# Patient Record
Sex: Female | Born: 1935 | Race: White | Hispanic: No | Marital: Married | Smoking: Former smoker
Health system: Southern US, Community
[De-identification: ages and names within clinical notes are randomized; demographics above are authoritative.]

## PROBLEM LIST (undated history)

## (undated) DIAGNOSIS — E782 Mixed hyperlipidemia: Secondary | ICD-10-CM

## (undated) DIAGNOSIS — K2950 Unspecified chronic gastritis without bleeding: Secondary | ICD-10-CM

## (undated) DIAGNOSIS — IMO0001 Reserved for inherently not codable concepts without codable children: Secondary | ICD-10-CM

## (undated) DIAGNOSIS — I482 Chronic atrial fibrillation, unspecified: Secondary | ICD-10-CM

## (undated) DIAGNOSIS — D649 Anemia, unspecified: Secondary | ICD-10-CM

## (undated) DIAGNOSIS — K449 Diaphragmatic hernia without obstruction or gangrene: Secondary | ICD-10-CM

## (undated) DIAGNOSIS — M4850XA Collapsed vertebra, not elsewhere classified, site unspecified, initial encounter for fracture: Secondary | ICD-10-CM

## (undated) DIAGNOSIS — J302 Other seasonal allergic rhinitis: Secondary | ICD-10-CM

## (undated) DIAGNOSIS — H353190 Nonexudative age-related macular degeneration, unspecified eye, stage unspecified: Secondary | ICD-10-CM

## (undated) DIAGNOSIS — N182 Chronic kidney disease, stage 2 (mild): Secondary | ICD-10-CM

## (undated) DIAGNOSIS — H3530 Unspecified macular degeneration: Secondary | ICD-10-CM

## (undated) DIAGNOSIS — Z8673 Personal history of transient ischemic attack (TIA), and cerebral infarction without residual deficits: Secondary | ICD-10-CM

## (undated) DIAGNOSIS — R918 Other nonspecific abnormal finding of lung field: Secondary | ICD-10-CM

## (undated) DIAGNOSIS — M1990 Unspecified osteoarthritis, unspecified site: Secondary | ICD-10-CM

## (undated) DIAGNOSIS — M810 Age-related osteoporosis without current pathological fracture: Secondary | ICD-10-CM

## (undated) DIAGNOSIS — H409 Unspecified glaucoma: Secondary | ICD-10-CM

## (undated) DIAGNOSIS — I509 Heart failure, unspecified: Secondary | ICD-10-CM

## (undated) DIAGNOSIS — K5790 Diverticulosis of intestine, part unspecified, without perforation or abscess without bleeding: Secondary | ICD-10-CM

## (undated) DIAGNOSIS — J449 Chronic obstructive pulmonary disease, unspecified: Secondary | ICD-10-CM

## (undated) DIAGNOSIS — K219 Gastro-esophageal reflux disease without esophagitis: Secondary | ICD-10-CM

## (undated) DIAGNOSIS — E039 Hypothyroidism, unspecified: Secondary | ICD-10-CM

## (undated) DIAGNOSIS — F419 Anxiety disorder, unspecified: Secondary | ICD-10-CM

## (undated) DIAGNOSIS — Z85828 Personal history of other malignant neoplasm of skin: Secondary | ICD-10-CM

## (undated) DIAGNOSIS — Z8679 Personal history of other diseases of the circulatory system: Secondary | ICD-10-CM

## (undated) DIAGNOSIS — F32A Depression, unspecified: Secondary | ICD-10-CM

## (undated) DIAGNOSIS — I10 Essential (primary) hypertension: Secondary | ICD-10-CM

## (undated) DIAGNOSIS — F329 Major depressive disorder, single episode, unspecified: Secondary | ICD-10-CM

## (undated) DIAGNOSIS — R0902 Hypoxemia: Secondary | ICD-10-CM

## (undated) DIAGNOSIS — Z8781 Personal history of (healed) traumatic fracture: Secondary | ICD-10-CM

## (undated) DIAGNOSIS — E119 Type 2 diabetes mellitus without complications: Secondary | ICD-10-CM

## (undated) DIAGNOSIS — M359 Systemic involvement of connective tissue, unspecified: Secondary | ICD-10-CM

## (undated) HISTORY — DX: Hypothyroidism, unspecified: E03.9

## (undated) HISTORY — DX: Anemia, unspecified: D64.9

## (undated) HISTORY — DX: Diaphragmatic hernia without obstruction or gangrene: K44.9

## (undated) HISTORY — DX: Reserved for inherently not codable concepts without codable children: IMO0001

## (undated) HISTORY — DX: Hypoxemia: R09.02

## (undated) HISTORY — DX: Age-related osteoporosis without current pathological fracture: M81.0

## (undated) HISTORY — DX: Depression, unspecified: F32.A

## (undated) HISTORY — DX: Chronic kidney disease, stage 2 (mild): N18.2

## (undated) HISTORY — DX: Personal history of other diseases of the circulatory system: Z86.79

## (undated) HISTORY — DX: Essential (primary) hypertension: I10

## (undated) HISTORY — DX: Heart failure, unspecified: I50.9

## (undated) HISTORY — DX: Anxiety disorder, unspecified: F41.9

## (undated) HISTORY — DX: Unspecified chronic gastritis without bleeding: K29.50

## (undated) HISTORY — PX: TONSILLECTOMY: SUR1361

## (undated) HISTORY — DX: Unspecified glaucoma: H40.9

## (undated) HISTORY — DX: Mixed hyperlipidemia: E78.2

## (undated) HISTORY — DX: Personal history of transient ischemic attack (TIA), and cerebral infarction without residual deficits: Z86.73

## (undated) HISTORY — DX: Major depressive disorder, single episode, unspecified: F32.9

## (undated) HISTORY — DX: Personal history of other malignant neoplasm of skin: Z85.828

## (undated) HISTORY — DX: Unspecified macular degeneration: H35.30

## (undated) HISTORY — DX: Other nonspecific abnormal finding of lung field: R91.8

## (undated) HISTORY — DX: Chronic atrial fibrillation, unspecified: I48.20

## (undated) HISTORY — PX: DILATION AND CURETTAGE OF UTERUS: SHX78

## (undated) HISTORY — DX: Collapsed vertebra, not elsewhere classified, site unspecified, initial encounter for fracture: M48.50XA

## (undated) HISTORY — DX: Diverticulosis of intestine, part unspecified, without perforation or abscess without bleeding: K57.90

## (undated) HISTORY — DX: Unspecified osteoarthritis, unspecified site: M19.90

## (undated) HISTORY — DX: Nonexudative age-related macular degeneration, unspecified eye, stage unspecified: H35.3190

## (undated) HISTORY — DX: Other seasonal allergic rhinitis: J30.2

## (undated) HISTORY — DX: Personal history of (healed) traumatic fracture: Z87.81

## (undated) HISTORY — DX: Gastro-esophageal reflux disease without esophagitis: K21.9

## (undated) HISTORY — DX: Type 2 diabetes mellitus without complications: E11.9

## (undated) HISTORY — DX: Chronic obstructive pulmonary disease, unspecified: J44.9

---

## 1981-07-20 HISTORY — PX: BREAST LUMPECTOMY: SHX2

## 1998-02-22 ENCOUNTER — Ambulatory Visit (HOSPITAL_COMMUNITY): Admission: RE | Admit: 1998-02-22 | Discharge: 1998-02-22 | Payer: Self-pay | Admitting: *Deleted

## 1999-02-28 ENCOUNTER — Ambulatory Visit (HOSPITAL_COMMUNITY): Admission: RE | Admit: 1999-02-28 | Discharge: 1999-02-28 | Payer: Self-pay | Admitting: *Deleted

## 2000-03-05 ENCOUNTER — Ambulatory Visit (HOSPITAL_COMMUNITY): Admission: RE | Admit: 2000-03-05 | Discharge: 2000-03-05 | Payer: Self-pay

## 2000-03-05 ENCOUNTER — Ambulatory Visit (HOSPITAL_COMMUNITY): Admission: RE | Admit: 2000-03-05 | Discharge: 2000-03-05 | Payer: Self-pay | Admitting: *Deleted

## 2000-12-23 ENCOUNTER — Other Ambulatory Visit: Admission: RE | Admit: 2000-12-23 | Discharge: 2000-12-23 | Payer: Self-pay | Admitting: Dermatology

## 2001-03-11 ENCOUNTER — Ambulatory Visit (HOSPITAL_COMMUNITY): Admission: RE | Admit: 2001-03-11 | Discharge: 2001-03-11 | Payer: Self-pay | Admitting: *Deleted

## 2001-03-11 ENCOUNTER — Ambulatory Visit (HOSPITAL_COMMUNITY): Admission: RE | Admit: 2001-03-11 | Discharge: 2001-03-11 | Payer: Self-pay

## 2001-03-17 ENCOUNTER — Ambulatory Visit (HOSPITAL_COMMUNITY): Admission: RE | Admit: 2001-03-17 | Discharge: 2001-03-17 | Payer: Self-pay | Admitting: Internal Medicine

## 2001-09-14 ENCOUNTER — Ambulatory Visit (HOSPITAL_COMMUNITY): Admission: RE | Admit: 2001-09-14 | Discharge: 2001-09-14 | Payer: Self-pay | Admitting: Internal Medicine

## 2001-09-14 ENCOUNTER — Encounter: Payer: Self-pay | Admitting: Internal Medicine

## 2001-10-03 ENCOUNTER — Other Ambulatory Visit: Admission: RE | Admit: 2001-10-03 | Discharge: 2001-10-03 | Payer: Self-pay | Admitting: Dermatology

## 2001-10-24 ENCOUNTER — Other Ambulatory Visit: Admission: RE | Admit: 2001-10-24 | Discharge: 2001-10-24 | Payer: Self-pay | Admitting: Obstetrics and Gynecology

## 2001-10-31 ENCOUNTER — Ambulatory Visit (HOSPITAL_COMMUNITY): Admission: RE | Admit: 2001-10-31 | Discharge: 2001-10-31 | Payer: Self-pay | Admitting: Internal Medicine

## 2001-10-31 ENCOUNTER — Encounter: Payer: Self-pay | Admitting: Internal Medicine

## 2002-03-17 ENCOUNTER — Ambulatory Visit (HOSPITAL_COMMUNITY): Admission: RE | Admit: 2002-03-17 | Discharge: 2002-03-17 | Payer: Self-pay | Admitting: Obstetrics & Gynecology

## 2002-03-17 ENCOUNTER — Ambulatory Visit (HOSPITAL_COMMUNITY): Admission: RE | Admit: 2002-03-17 | Discharge: 2002-03-17 | Payer: Self-pay | Admitting: *Deleted

## 2002-05-17 ENCOUNTER — Other Ambulatory Visit: Admission: RE | Admit: 2002-05-17 | Discharge: 2002-05-17 | Payer: Self-pay | Admitting: Dermatology

## 2003-01-18 ENCOUNTER — Ambulatory Visit (HOSPITAL_COMMUNITY): Admission: RE | Admit: 2003-01-18 | Discharge: 2003-01-18 | Payer: Self-pay | Admitting: Internal Medicine

## 2003-01-18 ENCOUNTER — Encounter: Payer: Self-pay | Admitting: Internal Medicine

## 2003-01-24 ENCOUNTER — Ambulatory Visit (HOSPITAL_COMMUNITY): Admission: RE | Admit: 2003-01-24 | Discharge: 2003-01-24 | Payer: Self-pay | Admitting: Internal Medicine

## 2003-03-22 ENCOUNTER — Ambulatory Visit (HOSPITAL_COMMUNITY): Admission: RE | Admit: 2003-03-22 | Discharge: 2003-03-22 | Payer: Self-pay | Admitting: Internal Medicine

## 2003-03-22 ENCOUNTER — Encounter: Payer: Self-pay | Admitting: Internal Medicine

## 2003-03-30 ENCOUNTER — Ambulatory Visit (HOSPITAL_COMMUNITY): Admission: RE | Admit: 2003-03-30 | Discharge: 2003-03-30 | Payer: Self-pay | Admitting: *Deleted

## 2003-04-02 ENCOUNTER — Encounter: Payer: Self-pay | Admitting: Cardiology

## 2003-04-02 ENCOUNTER — Ambulatory Visit (HOSPITAL_COMMUNITY): Admission: RE | Admit: 2003-04-02 | Discharge: 2003-04-02 | Payer: Self-pay | Admitting: Cardiology

## 2004-01-03 ENCOUNTER — Other Ambulatory Visit: Admission: RE | Admit: 2004-01-03 | Discharge: 2004-01-03 | Payer: Self-pay | Admitting: Dermatology

## 2004-02-21 ENCOUNTER — Other Ambulatory Visit: Admission: RE | Admit: 2004-02-21 | Discharge: 2004-02-21 | Payer: Self-pay | Admitting: Dermatology

## 2004-04-22 ENCOUNTER — Ambulatory Visit (HOSPITAL_COMMUNITY): Admission: RE | Admit: 2004-04-22 | Discharge: 2004-04-22 | Payer: Self-pay | Admitting: Obstetrics and Gynecology

## 2004-08-26 ENCOUNTER — Ambulatory Visit (HOSPITAL_COMMUNITY): Admission: RE | Admit: 2004-08-26 | Discharge: 2004-08-26 | Payer: Self-pay | Admitting: Family Medicine

## 2004-09-08 ENCOUNTER — Other Ambulatory Visit: Admission: RE | Admit: 2004-09-08 | Discharge: 2004-09-08 | Payer: Self-pay | Admitting: Dermatology

## 2004-11-06 ENCOUNTER — Ambulatory Visit (HOSPITAL_COMMUNITY): Admission: RE | Admit: 2004-11-06 | Discharge: 2004-11-06 | Payer: Self-pay | Admitting: Family Medicine

## 2004-11-27 ENCOUNTER — Ambulatory Visit: Payer: Self-pay | Admitting: Internal Medicine

## 2004-11-27 ENCOUNTER — Ambulatory Visit (HOSPITAL_COMMUNITY): Admission: RE | Admit: 2004-11-27 | Discharge: 2004-11-27 | Payer: Self-pay | Admitting: Internal Medicine

## 2005-04-29 ENCOUNTER — Other Ambulatory Visit: Admission: RE | Admit: 2005-04-29 | Discharge: 2005-04-29 | Payer: Self-pay | Admitting: Dermatology

## 2005-05-08 ENCOUNTER — Ambulatory Visit (HOSPITAL_COMMUNITY): Admission: RE | Admit: 2005-05-08 | Discharge: 2005-05-08 | Payer: Self-pay | Admitting: Internal Medicine

## 2005-05-14 ENCOUNTER — Encounter (HOSPITAL_COMMUNITY): Admission: RE | Admit: 2005-05-14 | Discharge: 2005-06-13 | Payer: Self-pay | Admitting: Internal Medicine

## 2006-05-10 ENCOUNTER — Ambulatory Visit (HOSPITAL_COMMUNITY): Admission: RE | Admit: 2006-05-10 | Discharge: 2006-05-10 | Payer: Self-pay | Admitting: Internal Medicine

## 2006-05-20 HISTORY — PX: PATELLA FRACTURE SURGERY: SHX735

## 2007-03-04 ENCOUNTER — Emergency Department (HOSPITAL_COMMUNITY): Admission: EM | Admit: 2007-03-04 | Discharge: 2007-03-04 | Payer: Self-pay | Admitting: Emergency Medicine

## 2007-05-12 ENCOUNTER — Ambulatory Visit (HOSPITAL_COMMUNITY): Admission: RE | Admit: 2007-05-12 | Discharge: 2007-05-12 | Payer: Self-pay | Admitting: Obstetrics and Gynecology

## 2007-06-02 ENCOUNTER — Ambulatory Visit (HOSPITAL_COMMUNITY): Admission: RE | Admit: 2007-06-02 | Discharge: 2007-06-02 | Payer: Self-pay | Admitting: Internal Medicine

## 2008-04-10 ENCOUNTER — Other Ambulatory Visit: Admission: RE | Admit: 2008-04-10 | Discharge: 2008-04-10 | Payer: Self-pay | Admitting: Obstetrics and Gynecology

## 2008-05-09 ENCOUNTER — Ambulatory Visit: Payer: Self-pay | Admitting: Cardiology

## 2008-05-10 ENCOUNTER — Encounter: Payer: Self-pay | Admitting: Cardiology

## 2008-05-10 ENCOUNTER — Ambulatory Visit: Payer: Self-pay | Admitting: Cardiology

## 2008-05-10 ENCOUNTER — Ambulatory Visit (HOSPITAL_COMMUNITY): Admission: RE | Admit: 2008-05-10 | Discharge: 2008-05-10 | Payer: Self-pay | Admitting: Cardiology

## 2008-05-14 ENCOUNTER — Ambulatory Visit: Payer: Self-pay | Admitting: Cardiology

## 2008-05-16 ENCOUNTER — Ambulatory Visit (HOSPITAL_COMMUNITY): Admission: RE | Admit: 2008-05-16 | Discharge: 2008-05-16 | Payer: Self-pay | Admitting: Obstetrics and Gynecology

## 2008-05-17 ENCOUNTER — Ambulatory Visit: Payer: Self-pay | Admitting: Cardiology

## 2008-05-28 ENCOUNTER — Ambulatory Visit: Payer: Self-pay | Admitting: Cardiology

## 2008-06-04 ENCOUNTER — Ambulatory Visit: Payer: Self-pay | Admitting: Cardiology

## 2008-06-21 ENCOUNTER — Ambulatory Visit: Payer: Self-pay | Admitting: Cardiology

## 2008-06-22 ENCOUNTER — Ambulatory Visit: Payer: Self-pay | Admitting: Cardiology

## 2008-06-22 DIAGNOSIS — I4819 Other persistent atrial fibrillation: Secondary | ICD-10-CM | POA: Insufficient documentation

## 2008-06-28 ENCOUNTER — Ambulatory Visit: Payer: Self-pay | Admitting: Cardiology

## 2008-07-05 ENCOUNTER — Ambulatory Visit: Payer: Self-pay | Admitting: Cardiology

## 2008-07-16 ENCOUNTER — Ambulatory Visit: Payer: Self-pay | Admitting: Cardiology

## 2008-07-23 ENCOUNTER — Ambulatory Visit: Payer: Self-pay | Admitting: Cardiology

## 2008-09-26 ENCOUNTER — Ambulatory Visit: Payer: Self-pay | Admitting: Cardiology

## 2008-10-04 ENCOUNTER — Ambulatory Visit: Payer: Self-pay | Admitting: Cardiology

## 2008-10-04 ENCOUNTER — Encounter (HOSPITAL_COMMUNITY): Admission: RE | Admit: 2008-10-04 | Discharge: 2008-11-03 | Payer: Self-pay | Admitting: Cardiology

## 2008-10-09 ENCOUNTER — Ambulatory Visit: Payer: Self-pay | Admitting: Cardiology

## 2009-01-11 ENCOUNTER — Encounter: Payer: Self-pay | Admitting: Cardiology

## 2009-01-11 ENCOUNTER — Ambulatory Visit: Payer: Self-pay | Admitting: Cardiology

## 2009-01-11 DIAGNOSIS — E785 Hyperlipidemia, unspecified: Secondary | ICD-10-CM

## 2009-01-14 ENCOUNTER — Telehealth: Payer: Self-pay | Admitting: Cardiology

## 2009-04-03 ENCOUNTER — Encounter (INDEPENDENT_AMBULATORY_CARE_PROVIDER_SITE_OTHER): Payer: Self-pay | Admitting: *Deleted

## 2009-04-03 LAB — CONVERTED CEMR LAB
ALT: 20 units/L
AST: 20 units/L
Alkaline Phosphatase: 50 units/L
BUN: 19 mg/dL
Creatinine, Ser: 0.7 mg/dL
Potassium: 4.2 meq/L

## 2009-04-17 ENCOUNTER — Encounter: Payer: Self-pay | Admitting: Cardiology

## 2009-04-18 ENCOUNTER — Encounter: Payer: Self-pay | Admitting: Cardiology

## 2009-05-17 ENCOUNTER — Ambulatory Visit (HOSPITAL_COMMUNITY): Admission: RE | Admit: 2009-05-17 | Discharge: 2009-05-17 | Payer: Self-pay | Admitting: Obstetrics and Gynecology

## 2009-06-11 ENCOUNTER — Encounter: Payer: Self-pay | Admitting: Cardiology

## 2009-06-11 ENCOUNTER — Encounter (INDEPENDENT_AMBULATORY_CARE_PROVIDER_SITE_OTHER): Payer: Self-pay | Admitting: *Deleted

## 2009-06-11 LAB — CONVERTED CEMR LAB
ALT: 26 units/L
AST: 21 units/L
Bilirubin, Direct: 0.1 mg/dL
LDL Cholesterol: 137 mg/dL

## 2009-06-12 ENCOUNTER — Encounter (INDEPENDENT_AMBULATORY_CARE_PROVIDER_SITE_OTHER): Payer: Self-pay | Admitting: *Deleted

## 2009-06-12 LAB — CONVERTED CEMR LAB
ALT: 26 units/L (ref 0–35)
AST: 21 units/L (ref 0–37)
Albumin: 4.1 g/dL (ref 3.5–5.2)
Alkaline Phosphatase: 45 units/L (ref 39–117)
Bilirubin, Direct: 0.1 mg/dL (ref 0.0–0.3)
Cholesterol: 220 mg/dL — ABNORMAL HIGH (ref 0–200)
HDL: 63 mg/dL (ref >39)
Indirect Bilirubin: 0.3 mg/dL (ref 0.0–0.9)
LDL Cholesterol: 137 mg/dL — ABNORMAL HIGH (ref 0–99)
Total Bilirubin: 0.4 mg/dL (ref 0.3–1.2)
Total CHOL/HDL Ratio: 3.5
Total Protein: 6.2 g/dL (ref 6.0–8.3)
Triglycerides: 99 mg/dL (ref <150)
VLDL: 20 mg/dL (ref 0–40)

## 2009-06-25 ENCOUNTER — Ambulatory Visit: Payer: Self-pay | Admitting: Cardiology

## 2009-06-25 ENCOUNTER — Encounter (INDEPENDENT_AMBULATORY_CARE_PROVIDER_SITE_OTHER): Payer: Self-pay | Admitting: *Deleted

## 2009-07-20 DIAGNOSIS — K449 Diaphragmatic hernia without obstruction or gangrene: Secondary | ICD-10-CM

## 2009-07-20 HISTORY — DX: Diaphragmatic hernia without obstruction or gangrene: K44.9

## 2009-07-20 HISTORY — PX: OTHER SURGICAL HISTORY: SHX169

## 2009-10-10 ENCOUNTER — Ambulatory Visit (HOSPITAL_COMMUNITY): Admission: RE | Admit: 2009-10-10 | Discharge: 2009-10-10 | Payer: Self-pay | Admitting: Family Medicine

## 2009-10-14 ENCOUNTER — Encounter (INDEPENDENT_AMBULATORY_CARE_PROVIDER_SITE_OTHER): Payer: Self-pay | Admitting: *Deleted

## 2009-10-16 ENCOUNTER — Ambulatory Visit: Payer: Self-pay | Admitting: Internal Medicine

## 2009-10-16 DIAGNOSIS — R634 Abnormal weight loss: Secondary | ICD-10-CM

## 2009-10-16 DIAGNOSIS — R1319 Other dysphagia: Secondary | ICD-10-CM

## 2009-10-16 DIAGNOSIS — R933 Abnormal findings on diagnostic imaging of other parts of digestive tract: Secondary | ICD-10-CM

## 2009-10-17 ENCOUNTER — Encounter: Payer: Self-pay | Admitting: Internal Medicine

## 2009-10-18 DIAGNOSIS — K2950 Unspecified chronic gastritis without bleeding: Secondary | ICD-10-CM

## 2009-10-18 HISTORY — PX: ESOPHAGOGASTRODUODENOSCOPY: SHX1529

## 2009-10-18 HISTORY — DX: Unspecified chronic gastritis without bleeding: K29.50

## 2009-10-21 ENCOUNTER — Ambulatory Visit: Payer: Self-pay | Admitting: Internal Medicine

## 2009-10-21 ENCOUNTER — Ambulatory Visit (HOSPITAL_COMMUNITY): Admission: RE | Admit: 2009-10-21 | Discharge: 2009-10-21 | Payer: Self-pay | Admitting: Internal Medicine

## 2009-10-23 ENCOUNTER — Encounter: Payer: Self-pay | Admitting: Internal Medicine

## 2009-10-23 ENCOUNTER — Telehealth (INDEPENDENT_AMBULATORY_CARE_PROVIDER_SITE_OTHER): Payer: Self-pay

## 2009-11-29 ENCOUNTER — Encounter (INDEPENDENT_AMBULATORY_CARE_PROVIDER_SITE_OTHER): Payer: Self-pay | Admitting: *Deleted

## 2009-11-29 ENCOUNTER — Telehealth (INDEPENDENT_AMBULATORY_CARE_PROVIDER_SITE_OTHER): Payer: Self-pay | Admitting: *Deleted

## 2009-12-04 ENCOUNTER — Encounter: Payer: Self-pay | Admitting: Internal Medicine

## 2009-12-23 LAB — CONVERTED CEMR LAB
Chloride: 104 meq/L (ref 96–112)
LDL Cholesterol: 150 mg/dL — ABNORMAL HIGH (ref 0–99)
Phosphorus: 4.3 mg/dL (ref 2.3–4.6)
Potassium: 4.2 meq/L (ref 3.5–5.3)
Sodium: 142 meq/L (ref 135–145)
VLDL: 16 mg/dL (ref 0–40)

## 2010-01-01 ENCOUNTER — Ambulatory Visit: Payer: Self-pay | Admitting: Cardiology

## 2010-01-02 ENCOUNTER — Encounter (INDEPENDENT_AMBULATORY_CARE_PROVIDER_SITE_OTHER): Payer: Self-pay

## 2010-01-09 ENCOUNTER — Encounter: Payer: Self-pay | Admitting: Cardiology

## 2010-01-09 ENCOUNTER — Encounter (INDEPENDENT_AMBULATORY_CARE_PROVIDER_SITE_OTHER): Payer: Self-pay

## 2010-01-09 ENCOUNTER — Ambulatory Visit: Payer: Self-pay | Admitting: Cardiology

## 2010-01-16 ENCOUNTER — Ambulatory Visit: Payer: Self-pay | Admitting: Cardiology

## 2010-01-16 ENCOUNTER — Encounter: Payer: Self-pay | Admitting: Gastroenterology

## 2010-01-28 ENCOUNTER — Ambulatory Visit: Payer: Self-pay | Admitting: Cardiology

## 2010-01-28 ENCOUNTER — Inpatient Hospital Stay (HOSPITAL_COMMUNITY): Admission: EM | Admit: 2010-01-28 | Discharge: 2010-01-30 | Payer: Self-pay | Admitting: Emergency Medicine

## 2010-01-29 ENCOUNTER — Encounter: Payer: Self-pay | Admitting: Cardiology

## 2010-01-31 ENCOUNTER — Telehealth (INDEPENDENT_AMBULATORY_CARE_PROVIDER_SITE_OTHER): Payer: Self-pay | Admitting: *Deleted

## 2010-02-14 ENCOUNTER — Encounter (INDEPENDENT_AMBULATORY_CARE_PROVIDER_SITE_OTHER): Payer: Self-pay

## 2010-02-19 ENCOUNTER — Ambulatory Visit: Payer: Self-pay | Admitting: Cardiology

## 2010-02-19 DIAGNOSIS — I059 Rheumatic mitral valve disease, unspecified: Secondary | ICD-10-CM | POA: Insufficient documentation

## 2010-02-20 ENCOUNTER — Encounter: Payer: Self-pay | Admitting: Cardiology

## 2010-03-14 ENCOUNTER — Ambulatory Visit: Payer: Self-pay | Admitting: Internal Medicine

## 2010-03-14 DIAGNOSIS — K5900 Constipation, unspecified: Secondary | ICD-10-CM

## 2010-03-17 ENCOUNTER — Encounter: Payer: Self-pay | Admitting: Internal Medicine

## 2010-03-20 HISTORY — PX: COLONOSCOPY: SHX174

## 2010-03-28 ENCOUNTER — Ambulatory Visit: Payer: Self-pay | Admitting: Cardiology

## 2010-04-01 ENCOUNTER — Ambulatory Visit: Payer: Self-pay | Admitting: Internal Medicine

## 2010-04-01 ENCOUNTER — Ambulatory Visit (HOSPITAL_COMMUNITY): Admission: RE | Admit: 2010-04-01 | Discharge: 2010-04-01 | Payer: Self-pay | Admitting: Internal Medicine

## 2010-05-03 ENCOUNTER — Emergency Department (HOSPITAL_COMMUNITY): Admission: EM | Admit: 2010-05-03 | Discharge: 2010-05-03 | Payer: Self-pay | Admitting: Emergency Medicine

## 2010-05-05 ENCOUNTER — Ambulatory Visit: Payer: Self-pay | Admitting: Cardiology

## 2010-05-05 DIAGNOSIS — I10 Essential (primary) hypertension: Secondary | ICD-10-CM

## 2010-05-19 ENCOUNTER — Ambulatory Visit (HOSPITAL_COMMUNITY): Admission: RE | Admit: 2010-05-19 | Discharge: 2010-05-19 | Payer: Self-pay | Admitting: Family Medicine

## 2010-05-20 HISTORY — PX: OTHER SURGICAL HISTORY: SHX169

## 2010-06-04 ENCOUNTER — Other Ambulatory Visit: Admission: RE | Admit: 2010-06-04 | Discharge: 2010-06-04 | Payer: Self-pay | Admitting: Obstetrics and Gynecology

## 2010-06-11 ENCOUNTER — Ambulatory Visit (HOSPITAL_COMMUNITY): Admission: RE | Admit: 2010-06-11 | Discharge: 2010-06-11 | Payer: Self-pay | Admitting: Family Medicine

## 2010-06-13 ENCOUNTER — Ambulatory Visit (HOSPITAL_COMMUNITY): Admission: RE | Admit: 2010-06-13 | Discharge: 2010-06-13 | Payer: Self-pay | Admitting: Family Medicine

## 2010-06-18 ENCOUNTER — Ambulatory Visit: Payer: Self-pay | Admitting: Cardiology

## 2010-06-19 ENCOUNTER — Encounter: Payer: Self-pay | Admitting: Internal Medicine

## 2010-07-20 HISTORY — PX: CATARACT EXTRACTION: SUR2

## 2010-08-21 NOTE — Assessment & Plan Note (Signed)
Summary: 3 mth f/u per checkout on 05/05/10/tg   Visit Type:  Follow-up Primary Provider:  Dr. Assunta Found   History of Present Illness: 77 year old woman presents for followup. She was seen in October, and is preparing to leave for [place] soon with her husband. She plans to return in March of next year.  She reports occasional palpitations that are rapid, however generally feels "good" with better energy. She prefers to maintain the present regimen including aspirin and flecainide. No obvious stroke or TIA symptoms.  She reports recent lab work with Dr. Phillips Odor.  Current Medications (verified): 1)  Felodipine 10 Mg Xr24h-Tab (Felodipine) .... Take 1 Tablet By Mouth Once A Day 2)  Levothroid 50 Mcg Tabs (Levothyroxine Sodium) .... Take 1 Atb Daily 3)  Aspirin Ec 325 Mg Tbec (Aspirin) .... Take 1 Tablet By Mouth Once A Day 4)  Fish Oil Concentrate 1000 Mg Caps (Omega-3 Fatty Acids) .... Two Tablets By Mouth Two Times A Day 5)  Xalatan 0.005 % Soln (Latanoprost) .Marland Kitchen.. 1 Drop Both Eyes Once Daily 6)  Calcium Plus D .... Two Tablets Daily 7)  Estrace 0.1 Mg/gm Crea (Estradiol) .... As Directed 8)  Clonazepam 0.5 Mg Tabs (Clonazepam) .... Take 1 Tablet By Mouth Two Times A Day 9)  Multi Vitamin .... Take 1 Tablet By Mouth Once A Day 10)  Genteal Eye Drops .... As Directed 11)  Vagi Gard .... As Needed 12)  Colace 100 Mg Caps (Docusate Sodium) .... One By Mouth As Needed, Several Times Per Week 13)  Omeprazole 20 Mg Cpdr (Omeprazole) .... One By Mouth 30 Mins Before Breakfast Daily 14)  Fiber Therapy .... Once Daily 15)  Flecainide Acetate 50 Mg Tabs (Flecainide Acetate) .... Take 1 Tablet By Mouth Two Times A Day  Allergies (verified): 1)  ! Morphine 2)  ! Darvocet 3)  ! * Shellfish 4)  ! * Demoral 5)  ! Codeine  Comments:  Nurse/Medical Assistant: patient reviewed med list from previous ov and stated the only change is her levothroid from 25 micrograms to 50 micrograms  daily  Past History:  Past Medical History: Last updated: 03/28/2010 Atrial Fibrillation - paroxysmal, prefers ASA over coumadin Cerebrovascular Disease - remote TIA Hyperlipidemia Hypertension Hypothyroidism Rheumatic fever Fractured patella Osteoporosis, compression fracture of thoracic spine  Social History: Last updated: 10/16/2009 Retired - registerd Engineer, civil (consulting), [employer], 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 Multiple unsuccessful pregnancies. One child died in infancy. Multiple adopted Married  Tobacco Use - No.  Alcohol Use - no Grew up in [place], goes back for 2-3 months a year  Review of Systems  The patient denies anorexia, fever, weight loss, chest pain, syncope, dyspnea on exertion, peripheral edema, melena, and hematochezia.         Otherwise reviewed and negative.  Vital Signs:  Patient profile:   77 year old female Weight:      112 pounds BMI:     17.87 Pulse rate:   70 / minute BP sitting:   116 / 71  (right arm)  Vitals Entered By: Dreama Saa, CNA (June 18, 2010 8:47 AM)  Physical Exam  Additional Exam:  Normally nourished appearing woman in no acute distress.  HEENT: Resolving periorbital ecchymoses following her fall, oropharynx clear. Neck: Supple no elevated venous pressure or carotid bruits. Cardiac: Irregularly irregular, no pathologic systolic murmur. Lungs: Clear to auscultation. Nonlabored breathing at rest. Extremities: No pitting edema. Skin: Warm and dry. Musculoskeletal: No kyphosis. Neuropsychiatric: Alert and oriented x3, affect appropriate.  Impression & Recommendations:  Problem # 1:  ATRIAL FIBRILLATION (ICD-427.31)  Symptomatically stable. Continue present medical regimen. I will see her back in 3 months. Will request recent labs from Dr. Phillips Odor for review.  Her updated medication list for this problem includes:    Aspirin Ec 325 Mg Tbec (Aspirin) .Marland Kitchen... Take 1 tablet by mouth once a day    Flecainide Acetate 50 Mg  Tabs (Flecainide acetate) .Marland Kitchen... Take 1 tablet by mouth two times a day  Problem # 2:  ESSENTIAL HYPERTENSION, BENIGN (ICD-401.1)  Blood pressure looks good today.  Her updated medication list for this problem includes:    Felodipine 10 Mg Xr24h-tab (Felodipine) .Marland Kitchen... Take 1 tablet by mouth once a day    Aspirin Ec 325 Mg Tbec (Aspirin) .Marland Kitchen... Take 1 tablet by mouth once a day  Patient Instructions: 1)  Your physician recommends that you schedule a follow-up appointment in: 3 months

## 2010-08-21 NOTE — Letter (Signed)
Summary: [facility] [employer], [employer] at [employer]  [address] [place] [address]   Phone: [phone]  Fax: [phone]     Nov 29, 2009 MRN: [id number]   [facility] [address] [place], [place]  [address]      YOUR LAB WORK IS DUE   ______________June 1, 2011___________________________  Please go to [facility], located across the street from [facility] on the second floor.  Hours are Monday - Friday 7am until 7:30pm         Saturday 8am until 12noon    _x_  DO NOT EAT OR DRINK AFTER MIDNIGHT EVENING PRIOR TO LABWORK  __ YOUR LABWORK IS NOT FASTING --YOU MAY EAT PRIOR TO LABWORK

## 2010-08-21 NOTE — Progress Notes (Signed)
Summary: Omeprazole 20 mg per Dr. Jena Gauss  Phone Note Other Incoming   Caller: Note from Dr. Jena Gauss Summary of Call: Per Dr. Jena Gauss, pt can haqve Omeprazole 20 mg once daily, # 30 with 2 refills. Limit Ibuprofen, OV here in 3 months.  Pt was informed. Rx called to [facility]. Initial call taken by: Cloria Spring LPN,  October 23, 2009 1:33 PM

## 2010-08-21 NOTE — Letter (Signed)
Summary: tcs order  tcs order   Imported By: Rosine Beat 03/17/2010 16:43:16  _____________________________________________________________________  External Attachment:    Type:   Image     Comment:   External Document

## 2010-08-21 NOTE — Letter (Signed)
Summary: Patient Notice, Endo Biopsy Results  [facility]  [address]   [place], [place] [address]   Phone: [phone]  Fax: [phone]       October 23, 2009   [name] [address] [place], [place]  [address] 05/29/1926    Dear Ms. Biggar,  I am pleased to inform you that the biopsies taken during your recent endoscopic examination did not show any evidence of cancer upon pathologic examination.  Additional information/recommendations:  Continue with the treatment plan as outlined on the day of your exam.  Please call us if you are having persistent problems or have questions about your condition that have not been fully answered at this time.  Sincerely,    R. Roetta Sessions MD, FACP [facility] Ph: [phone]   Fax: [phone]   Appended Document: Patient Notice, Endo Biopsy Results Letter mailed to pt.

## 2010-08-21 NOTE — Medication Information (Signed)
Summary: RX Folder  RX Folder   Imported By: Peggyann Shoals 01/16/2010 13:18:37  _____________________________________________________________________  External Attachment:    Type:   Image     Comment:   External Document  Appended Document: RX Folder-omeprazole    Prescriptions: OMEPRAZOLE 20 MG CPDR (OMEPRAZOLE) one by mouth 30 mins before breakfast daily  #30 x 11   Entered and Authorized by:   [name] Battles. Dixon Boos   Signed by:   [name] Battles Mellanie Bejarano PA-C on 01/16/2010   Method used:   [facility]        [facility] [phone]* (retail)       [address]       [place], [place]  [address]       Ph: [phone] or [phone]       Fax: [phone]   RxID:   [id number]

## 2010-08-21 NOTE — Assessment & Plan Note (Signed)
Summary: CONSULT FOR TCS/SS   Visit Type:  Initial Visit Primary Care [name]:  golding  Chief Complaint:  consult for tcs.  History of Present Illness: Pleasant 77 year old lady here for high-risk screening colonoscopy. Father with history of colon cancer. last colonoscopy in 2006 which demonstrated only left-sided diverticula. She's not having a lower GI tract symptoms at this time. She desires as a surveillance exam. She's had no intercurrent medical problems aside from being hospitalized for 3 days one month ago with an irregular heartbeat. She has a history of atrial fibrillation; she declines Coumadin therapy. She has no lower GI tract symptoms aside from occasional constipation.  Current Problems (verified): 1)  Mitral Valve Disorder  (ICD-424.0) 2)  Pulmonary Nodule  (ICD-518.89) 3)  Nonspecific Abn Finding Rad & Oth Exam Gi Tract  (ICD-793.4) 4)  Other Dysphagia  (ICD-787.29) 5)  Weight Loss, Abnormal  (ICD-783.21) 6)  Hyperlipidemia  (ICD-272.4) 7)  Atrial Fibrillation  (ICD-427.31)  Current Medications (verified): 1)  Felodipine 10 Mg Xr24h-Tab (Felodipine) .... Take 1 Tablet By Mouth Once A Day 2)  Levothroid 25 Mcg Tabs (Levothyroxine Sodium) .... Take 1 Tablet By Mouth Once A Day 3)  Aspirin Ec 325 Mg Tbec (Aspirin) .... Take 1 Tablet By Mouth Once A Day 4)  Fish Oil Concentrate 1000 Mg Caps (Omega-3 Fatty Acids) .... Two Tablets By Mouth Two Times A Day 5)  Xalatan 0.005 % Soln (Latanoprost) .Marland Kitchen.. 1 Drop Both Eyes Once Daily 6)  Calcium Plus D .... Two Tablets Daily 7)  Estrace 0.1 Mg/gm Crea (Estradiol) .... As Directed 8)  Clonazepam 0.5 Mg Tabs (Clonazepam) .... Take 1 Tablet By Mouth Two Times A Day 9)  Multi Vitamin .... Take 1 Tablet By Mouth Once A Day 10)  Genteal Eye Drops .... As Directed 6)  Vagi Gard .... As Needed 12)  Colace 100 Mg Caps (Docusate Sodium) .... One By Mouth As Needed, Several Times Per Week 13)  Omeprazole 20 Mg Cpdr (Omeprazole) .... One  By Mouth 30 Mins Before Breakfast Daily 14)  Pindolol 5 Mg Tabs (Pindolol) .... Take 1 Tab Two Times A Day 15)  Boniva 150 Mg Tabs (Ibandronate Sodium) .... Q Month 16)  Fiber Therapy .... Once Daily  Allergies (verified): 1)  ! Morphine 2)  ! Darvocet 3)  ! * Shellfish 4)  ! * Demoral 5)  ! Codeine  Past History:  Past Medical History: Last updated: 2009-10-28 Atrial Fibrillation - paroxysmal, prefers asa over coumadin Cerebrovascular Disease - remote TIA Hyperlipidemia Hypertension Hypothyroidism Rheumatic fever Fractured patella Osteoporosis, compression fracture of thoracici spine  Past Surgical History: Last updated: 28-Oct-2009 Tonsillectomy Cesarean section Left breast lumpectomy Left patellar fracture surgery  Family History: Last updated: 10-28-09 Father: died colon cancer, age 45 Mother: died MI Siblings: sister with atrial fibrillation Paternal Grandmother, CVA Maternal Aunt, abdominal cancer  Social History: Last updated: 2009/10/28 Retired - registerd Engineer, civil (consulting), [employer], 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 Multiple unsuccessful pregnancies. One child died in infancy. Multiple adopted Married  Tobacco Use - No.  Alcohol Use - no Grew up in [place], goes back for 2-3 months a year  Risk Factors: Smoking Status: never (06/22/2008)  Vital Signs:  Patient profile:   77 year old female Height:      66.5 inches Weight:      111 pounds BMI:     17.71 Temp:     98.0 degrees F oral Pulse rate:   76 / minute BP sitting:   100 /  70  (left arm) Cuff size:   regular  Vitals Entered By: Hendricks Limes LPN (March 14, 2010 2:30 PM)  Physical Exam  General:  alert conversant well oriented lady who speaks with a delightful [place] accident. Abdomen:  Soft, nontender and nondistended. No masses, hepatosplenomegaly or hernias noted. Normal bowel sounds. Rectal:  deferred until time of colonoscopy the  Impression & Recommendations: Impression: A very  pleasant 77 year old lady presents for screening colonoscopy. Positive family history of CRC in a first degree relative. Negative colonoscopy in  2006.    I doubt this lady will ever have a problem with colon cancer given a negative colonoscopy 5 years ago.  She very much would like to have another examination.  Recommendationcolon ;  offer one more screening colonoscopy. Risks, benefits, limitations alternatives and imponderables have been reviewed. All questions answered. All parties agreeable. We'll make further recommendations once a colonoscopy as been carried out.  Other Orders: New Patient Level III [id number])

## 2010-08-21 NOTE — Letter (Signed)
Summary: Recall Colonoscopy/Endoscopy, Change to Office Visit  [facility]  [address]   [place], [place] [address]   Phone: [phone]  Fax: [phone]      January 09, 2010   CAMMIE FAULSTICH [address] [place], [place]  [address] [birth year]   Dear Ms. Rossi,   According to our records, it is time for you to schedule a Colonoscopy/Endoscopy. However, after reviewing your medical record, we recommend an office visit in order to determine your need for a repeat procedure.  Please call [phone] at your convenience to schedule an office visit. If you have any questions or concerns, please feel free to contact our office.   Sincerely,   Cloria Spring LPN  [facility] Ph: [phone]   Fax: [phone]

## 2010-08-21 NOTE — Assessment & Plan Note (Signed)
Summary: 1 mth f/u per checkout on 01/01/10/tg   Visit Type:  Follow-up Primary Provider:  Dr.John Phillips Odor   History of Present Illness: 77 year old woman presents for followup. I saw her in consultation back in July following an episode of near syncope. Etiology was not entirely clear after hospital evaluation, not clearly cardiogenic. She had been on flecainide for suppression of paroxysmal atrial fibrillation, and outpatient treadmill testing did not demonstrate any inducible arrhythmias. She ruled out for acute coronary syndrome. Coumadin was again discussed, however she continued to prefer aspirin. We decided to stop her flecainide temporarily to make sure that she was not manifesting any new ischemic symptoms, and placed her on pindolol.  She indicates initial symptomatic bradycardia, however states that her heart rate has improved over the last few weeks. She still feels weak, but better than she did initially. Also feels irregularity to her heart beat. Followup electrocardiogram is reviewed below.  She has had no exertional chest pain, no near-syncope or syncope.  Today we discussed either continuing on pindolol to see if she gradually continues to feel better as it relates to weakness, versus going back to Flecainide for rhythm suppression. She preferred more time on pindolol prior to making a decision.  Current Medications (verified): 1)  Felodipine 10 Mg Xr24h-Tab (Felodipine) .... Take 1 Tablet By Mouth Once A Day 2)  Levothroid 25 Mcg Tabs (Levothyroxine Sodium) .... Take 1 Tablet By Mouth Once A Day 3)  Actonel 35 Mg Tabs (Risedronate Sodium) .... Once A Week 4)  Aspirin Ec 325 Mg Tbec (Aspirin) .... Take 1 Tablet By Mouth Once A Day 5)  Fish Oil Concentrate 1000 Mg Caps (Omega-3 Fatty Acids) .... Two Tablets By Mouth Two Times A Day 6)  Miralax  Powd (Polyethylene Glycol 3350) .... As Needed 7)  Xalatan 0.005 % Soln (Latanoprost) .Marland Kitchen.. 1 Drop Both Eyes Once Daily 8)  Calcium Plus  D .... Two Tablets Daily 9)  Estrace 0.1 Mg/gm Crea (Estradiol) .... As Directed 10)  Clonazepam 0.5 Mg Tabs (Clonazepam) .... Take 1 Tablet By Mouth Two Times A Day 11)  Multi Vitamin .... Take 1 Tablet By Mouth Once A Day 12)  Genteal Eye Drops .... As Directed 14)  Vagi Gard .... As Needed 14)  Colace 100 Mg Caps (Docusate Sodium) .... One By Mouth As Needed, Several Times Per Week 15)  Omeprazole 20 Mg Cpdr (Omeprazole) .... One By Mouth 30 Mins Before Breakfast Daily 16)  Pindolol 5 Mg Tabs (Pindolol) .... Take 1 Tab Two Times A Day  Allergies (verified): 1)  ! Morphine 2)  ! Darvocet 3)  ! * Shellfish 4)  ! * Demoral 5)  ! 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 Multiple unsuccessful pregnancies. One child died in infancy. Multiple adopted Married  Tobacco Use - No.  Alcohol Use - no Grew up in [place], goes back for 2-3 months a year   Review of Systems  The patient denies anorexia, fever, chest pain, syncope, dyspnea on exertion, peripheral edema, melena, and hematochezia.         Otherwise reviewed and negative.  Vital Signs:  Patient profile:   77 year old female Weight:      111 pounds Pulse  rate:   61 / minute BP sitting:   112 / 67  (right arm)  Vitals Entered By: Dreama Saa, CNA (February 19, 2010 10:36 AM)  Physical Exam  Additional Exam:  Normally nourished appearing woman in no acute distress.  HEENT: Conjunctiva and lids are normal, oropharynx clear. Neck: Supple no elevated venous pressure or carotid bruits. Cardiac: regular rate and rhythm with ectopics, no pathologic  systolic murmur. Lungs: Clear to auscultation. Nonlabored breathing at rest. Extremities: No pitting edema. Skin: Warm and dry. Musculoskeletal: No kyphosis. Neuropsychiatric: Alert and oriented x3, affect appropriate.    CT Scan  Procedure date:  01/29/2010  Findings:      IMPRESSION:   Stable nonspecific pulmonary nodules, recommend follow-up in 6   months to demonstrate continued stability.   Thoracolumbar compression fractures unchanged.   Mildly enlarged right hilar lymph node seen on previous exam no   longer identified.   Minimal pericardial effusion.   Echocardiogram  Procedure date:  01/28/2010  Findings:      Study Conclusions    - Left ventricle: The cavity size was normal. There was mild focal     basal hypertrophy of the septum. Systolic function was normal. The     estimated ejection fraction was in the range of 55% to 60%. Wall     motion was normal; there were no regional wall motion     abnormalities.   - Mitral valve: Mild to moderate regurgitation.   - Left atrium: The atrium was mildly to moderately dilated.   - Right atrium: The atrium was mildly dilated.   - Atrial septum: There was a patent foramen ovale with left to right     flow.   - Pulmonary arteries: PA peak pressure: 39mm Hg (S).   Impressions:    - Compared to the prior study performed 05/10/08,mitral     regurgitation is more promient.  Nuclear Study  Procedure date:  10/04/2008  Findings:      Scintigraphic Data: Acquisition notable for mild breast   attenuation.  There was minimal diaphragmatic attenuation.  Left   ventricular size was normal.  On tomographic images reconstructed   in standard planes, there was a very small area of thinning in the   distal septum that was not numerically significant by quantitative   analysis and for which no reversibility was apparent.  The gated   reconstruction demonstrated hyperdynamic regional and global LV   systolic function as well as  normal systolic accentuation of   activity throughout.  Estimated ejection fraction exceeded 65%.    IMPRESSION:   Negative and adequate stress nuclear myocardial study revealing   good exercise tolerance, a negative stress EKG, normal left   ventricular size, normal left ventricular systolic function and   normal myocardial perfusion.  Other findings as noted.  Exercise Stress Test  Procedure date:  01/16/2010  Findings:      Protocol:       Standard Bruce-maximal    Maximum BP:        170 / 60    MPHR (bpm):        138    85% MPHR (bpm):     117    MHR obtained (bpm):        116    Total Exercise Time       (min:sec):       10:30    Workload in METS:     11.2    ST Segment  analysis:       At Rest:       normal ST segments-no evidence of significant ST depression       With Exercise:     normal    Arrhythmia:             no    Angina during ETT:     absent (0)  EKG  Procedure date:  02/19/2010  Findings:      Sinus rhythm with atrial bigeminy, heart rate 69, nonspecific ST changes.  Impression & Recommendations:  Problem # 1:  ATRIAL FIBRILLATION (ICD-427.31)  Currently in sinus rhythm with frequent atrial ectopy. Plan at this point is to continue aspirin and pindolol. I will see her back in one month to discuss either resuming flecainide or continuing her present course. She still does not want to be on Coumadin.  The following medications were removed from the medication list:    Flecainide Acetate 50 Mg Tabs (Flecainide acetate) .Marland Kitchen... Take 1 tablet by mouth two times a day Her updated medication list for this problem includes:    Aspirin Ec 325 Mg Tbec (Aspirin) .Marland Kitchen... Take 1 tablet by mouth once a day    Pindolol 5 Mg Tabs (Pindolol) .Marland Kitchen... Take 1 tab two times a day  Orders: EKG w/ Interpretation (93000)  Problem # 2:  MITRAL VALVE DISORDER (ICD-424.0)  Recently documented mild to moderate mitral regurgitation by followup echocardiogram. Asymptomatic at this  point.  Her updated medication list for this problem includes:    Pindolol 5 Mg Tabs (Pindolol) .Marland Kitchen... Take 1 tab two times a day  Problem # 3:  HYPERLIPIDEMIA (ICD-272.4)  Continues on omega 3 supplements.  Patient Instructions: 1)  Your physician recommends that you schedule a follow-up appointment in: 1 month 2)  Your physician recommends that you continue on your current medications as directed. Please refer to the Current Medication list given to you today.

## 2010-08-21 NOTE — Assessment & Plan Note (Signed)
Summary: WT LOSS,GASTRIC WALL THICKENING.GU   Visit Type:  Initial Consult Referring Provider:  Cresenzo Primary Care Provider:  Cresenzo  Chief Complaint:  Wt loss/abn CT.  History of Present Illness: [name] is a pleasant 77 y/o female, patient of Dr. Patrica Duel, who presents for further evaluation of weight loss, abnormal stomach on CT scan. Patient states she has been feeling quite lousy for over one year. She spends 2-3 months per year in [place] where she was raised. Over the course of one year she's had increasing fatigue and just has not felt well. At one point last year she was found to have elevated glucose. She took diabetic classes. She was never started on medications but significantly changed her diet. At that time she weighed around 132 pounds. She states that she dropped about 12 pounds with dietary changes. Since she came home from [place] this time she went to see Dr. Nobie Putnam. He noted she had lost 17 more pounds. She c/o feeling depressed about not feeling well. She denies any abdominal pain although recalls about a year ago she was having some epigastric pain with pain radiating into her shoulders. She still has the back pain quite often. She states she feels an abdominal mass and has been concerned about that. She doesn't think it is stool. She generally is constipated but has a bowel movement most days alternating MiraLax with Colace. She denies any blood in the stool or melena. Her last colonoscopy was 5 years ago. She has a family history of colon cancer in her father and is due for colonoscopy this year.   She denies heartburn. She has chronic difficulty swallowing. She is careful when she eats/drinks. She feels like food doesn't want to go down and she also has problems initiated swallows at times. She complains of chronic upper respiratory infection symptoms since in [employer], and has had two courses of antibiotics.   CT Chest/abd/pelvis --> COPD with  scattered ATX, bilateral pulmonary nodules, largest 9 X 8mm, Gastric wall thickening, finding worrisome for neoplasm, compression deformities T7, T8-T12. LFTs, CBC, TSH normal.       Current Medications (verified): 1)  Felodipine 10 Mg Xr24h-Tab (Felodipine) .... Take 1 Tablet By Mouth Once A Day 2)  Levothroid 25 Mcg Tabs (Levothyroxine Sodium) .... Take 1 Tablet By Mouth Once A Day 3)  Actonel 35 Mg Tabs (Risedronate Sodium) .... Once A Week 4)  Aspirin Ec 325 Mg Tbec (Aspirin) .... Take 1 Tablet By Mouth Once A Day 5)  Fish Oil Concentrate 1000 Mg Caps (Omega-3 Fatty Acids) .... Two Tablets By Mouth Two Times A Day 6)  Miralax  Powd (Polyethylene Glycol 3350) .... As Needed 7)  Tylenol 325 Mg Tabs (Acetaminophen) .... As Needed 8)  Xalatan 0.005 % Soln (Latanoprost) .Marland Kitchen.. 1 Drop Both Eyes Once Daily 9)  Calcium Plus D .... Two Tablets Daily 10)  Ibuprofen .... Once Daily Prn 11)  Estrace 0.1 Mg/gm Crea (Estradiol) .... As Directed 12)  Clonazepam 0.5 Mg Tabs (Clonazepam) .... Take 1 Tablet By Mouth Two Times A Day 13)  Multi Vitamin .... Take 1 Tablet By Mouth Once A Day 14)  Genteal Eye Drops .... As Directed 15)  Vagi Gard .... As Needed 16)  Colace 100 Mg Caps (Docusate Sodium) .... One By Mouth As Needed, Several Times Per Week  Allergies: 1)  ! Morphine 2)  ! Darvocet 3)  ! * Shellfish 4)  ! * Demoral 5)  ! Codeine  Past History:  Past Medical History: Atrial Fibrillation - paroxysmal, prefers asa over coumadin Cerebrovascular Disease - remote TIA Hyperlipidemia Hypertension Hypothyroidism Rheumatic fever Fractured patella Osteoporosis, compression fracture of thoracici spine  Past Surgical History: Tonsillectomy Cesarean section Left breast lumpectomy Left patellar fracture surgery  Family History: Father: died colon cancer, age 60 Mother: died MI Siblings: sister with atrial fibrillation Paternal Grandmother, CVA Maternal Aunt, abdominal cancer  Social  History: Retired - registerd Engineer, civil (consulting), Government social research officer, 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 Multiple unsuccessful pregnancies. One child died in infancy. Multiple adopted Married  Tobacco Use - No.  Alcohol Use - no Grew up in [place], goes back for 2-3 months a year  Review of Systems General:  Complains of fatigue, weakness, malaise, and weight loss; denies fever, chills, sweats, and anorexia. Eyes:  Denies vision loss. ENT:  Complains of difficulty swallowing; denies nasal congestion, loss of smell, sore throat, and hoarseness. CV:  Denies chest pains, angina, palpitations, dyspnea on exertion, and peripheral edema. Resp:  Complains of cough; denies dyspnea at rest and dyspnea with exercise. GI:  See HPI. GU:  Denies urinary burning and blood in urine. MS:  Complains of joint pain / LOM. Derm:  Denies rash and itching. Neuro:  Complains of weakness; denies paralysis, frequent headaches, memory loss, and confusion. Psych:  Complains of depression; denies anxiety, memory loss, and suicidal ideation. Endo:  Complains of unusual weight change. Heme:  Denies bruising and bleeding. Allergy:  Denies hives and rash.  Vital Signs:  Patient profile:   77 year old female Height:      66.5 inches Weight:      105 pounds BMI:     16.75 Temp:     98.6 degrees F oral Pulse rate:   64 / minute BP sitting:   138 / 60  (left arm) Cuff size:   regular  Vitals Entered By: Cloria Spring LPN (October 16, 2009 2:48 PM)  Physical Exam  General:  Thin, elderly female in NAD Head:  Normocephalic and atraumatic. Eyes:  Conjunctivae pink, no scleral icterus.  Mouth:  Oropharyngeal mucosa moist, pink.  No lesions, erythema or exudate.    Neck:  Supple; no masses or thyromegaly. Lungs:  wheezes bilateral.   Heart:  Regular rate and rhythm; no murmurs, rubs,  or bruits. Abdomen:  Flat. Positive bowel sounds. Prominence in upper abd but no discrete mass. Nontender. No HSM. No abd bruit or hernia. Extremities:   No clubbing, cyanosis, edema or deformities noted. Neurologic:  Alert and  oriented x4;  grossly normal neurologically. Skin:  Intact without significant lesions or rashes. Cervical Nodes:  No significant cervical adenopathy. Psych:  Alert and cooperative. Normal mood and affect.  Impression & Recommendations:  Problem # 1:  NONSPECIFIC ABN FINDING RAD & OTH EXAM GI TRACT (ICD-793.4)  Chronic nonspecific symptoms associated with weight loss. CT showed abnormal stomach wall thickening. Malignancy needs to be excluded. EGD recommended. EGD to be performed in near future.  Risks, alternatives, benefits including but not limited to risk of reaction to medications, bleeding, infection, and perforation addressed.  Patient voiced understanding and verbal consent obtained.   Orders: Consultation Level IV ([address])  Problem # 2:  OTHER DYSPHAGIA (ICD-787.29)  Chronic dysphagia. Evaluate at time of EGD. Esophageal dilation if needed.   Orders: Consultation Level IV ([address])  Problem # 3:  PULMONARY NODULE (ICD-518.89) Further evaluation per PCP.    I would like to thank Dr. Nobie Putnam for allowing [place] to take part in the care of this  nice patient.

## 2010-08-21 NOTE — Letter (Signed)
Summary: REFERRAL/DR CRESENZO  REFERRAL/DR CRESENZO   Imported By: Diana Eves 10/17/2009 13:53:00  _____________________________________________________________________  External Attachment:    Type:   Image     Comment:   External Document

## 2010-08-21 NOTE — Letter (Signed)
Summary: Recall Office Visit  [facility]  [address]   [place], [place] [address]   Phone: [phone]  Fax: [phone]      February 14, 2010   ADAIJAH ENDRES [address] [place], [place]  [address] 15-Apr-1926   Dear Ms. Vensel,   According to our records, it is time for you to schedule a follow-up office visit with [place].   At your convenience, please call [phone] to schedule an office visit. If you have any questions, concerns, or feel that this letter is in error, we would appreciate your call.   Sincerely,    Hendricks Limes LPN  [facility] Ph: [phone]   Fax: [phone]  Appended Document: Recall Office Visit pt called to make appt. 03/14/10 @ 2:30 w/RMR

## 2010-08-21 NOTE — Letter (Signed)
Summary: TCS ORDER  TCS ORDER   Imported By: Rosine Beat 03/14/2010 15:28:56  _____________________________________________________________________  External Attachment:    Type:   Image     Comment:   External Document

## 2010-08-21 NOTE — Letter (Signed)
Summary: Internal Other  Internal Other   Imported By: Peggyann Shoals 12/04/2009 09:58:09  _____________________________________________________________________  External Attachment:    Type:   Image     Comment:   External Document

## 2010-08-21 NOTE — Progress Notes (Signed)
Summary: LOW BP  Phone Note Call from Patient Call back at Home Phone [phone]   Caller: PT Reason for Call: Talk to Nurse Summary of Call: PT WAS JUST DISCHARGE A COUPLE DAYS AGO AND SHE IS ON SOME BETA BLOCKERS NOW PLUS HER OTHER BP MEDS. HER BP HAS BEEN 100/6?-112/70 AND PULSE 48-60 SHE FEELS LIKE A WET CORN FLAKE. WANTS TO KNOW IF SHE SHOULD BE TAKING ALL MEDS. Initial call taken by: Faythe Ghee,  January 31, 2010 2:28 PM  Follow-up for Phone Call        pt adjusted times of bp medications and symptoms relieved, continues to be concerned about hr in 40's, pt states that hr is not sustained at 48 or lower.  I asked her to call office if sustained and we will see hr at ov on 02/19/10 Follow-up by: Teressa Lower RN,  February 04, 2010 12:02 PM    LMOM  Teressa Lower RN  February 03, 2010 1:04 PM

## 2010-08-21 NOTE — Assessment & Plan Note (Signed)
Summary: 6 mth f/u per checkout on 06/25/09/tg   Visit Type:  Follow-up Primary Provider:  Dr. Patrica Duel   History of Present Illness: 77 year old woman presents for followup. She states that she returned from [place] back in March. She had been having difficulty with weight loss and relative anorexia. She was ultimately referred to see Dr. Jena Gauss and underwent endoscopy in April which revealed multiple prepyloric antral erosions and ulcerations. Biopsy apparently did not reveal any malignancy. She has been treated with Prilosec, and states she feels much better, beginning to gain weight and appetite.  From a cardiac perspective she has had more frequent bouts of atrial fibrillation, almost one monthly, lasting sometimes several hours at a time. She has preferred to avoid Coumadin, although does have some interest in Pradaxa. At this point we felt it best not to pursue this medicine in light of her recent diagnosis of GI erosions and ulcerations. We did discuss antiarrhythmic therapy, however, for better rhythm control.  Followup labs from 1 June revealed cholesterol 233, triglycerides 79, HDL 67, LDL 150, potassium 4.2, BUN 18, creatinine 0.7. We continue to discuss the possibility of statin therapy, although at this time she prefers to hold off. We discussed diet.  Clinical Review Panels:  Echocardiogram Echocardiogram  SUMMARY   -  The aortic valve was mildly to moderately calcified.   -  There was mild fibrocalcific change of the aortic root.   -  The effective orifice of mitral regurgitation by proximal         isovelocity surface area was 0.12 cm^2. The volume of mitral         regurgitation by proximal isovelocity surface area was 17 cc.   -  The left atrium was mild to moderately dilated.   -  The right atrium was mildly dilated.    COMPARISONS   -  Compared to the previous study of 24-Jan-2003 :LV function now         normal.     ------------------------------  (05/10/2008)    Current Medications (verified): 1)  Felodipine 10 Mg Xr24h-Tab (Felodipine) .... Take 1 Tablet By Mouth Once A Day 2)  Levothroid 25 Mcg Tabs (Levothyroxine Sodium) .... Take 1 Tablet By Mouth Once A Day 3)  Actonel 35 Mg Tabs (Risedronate Sodium) .... Once A Week 4)  Aspirin Ec 325 Mg Tbec (Aspirin) .... Take 1 Tablet By Mouth Once A Day 5)  Fish Oil Concentrate 1000 Mg Caps (Omega-3 Fatty Acids) .... Two Tablets By Mouth Two Times A Day 6)  Miralax  Powd (Polyethylene Glycol 3350) .... As Needed 7)  Tylenol 325 Mg Tabs (Acetaminophen) .... As Needed 8)  Xalatan 0.005 % Soln (Latanoprost) .Marland Kitchen.. 1 Drop Both Eyes Once Daily 9)  Calcium Plus D .... Two Tablets Daily 10)  Ibuprofen .... Once Daily Prn 11)  Estrace 0.1 Mg/gm Crea (Estradiol) .... As Directed 12)  Clonazepam 0.5 Mg Tabs (Clonazepam) .... Take 1 Tablet By Mouth Two Times A Day 13)  Multi Vitamin .... Take 1 Tablet By Mouth Once A Day 14)  Genteal Eye Drops .... As Directed 15)  Vagi Gard .... As Needed 16)  Colace 100 Mg Caps (Docusate Sodium) .... One By Mouth As Needed, Several Times Per Week 17)  Prilosec 20 Mg Cpdr (Omeprazole) .... Take 1 Tab Daily 18)  Flecainide Acetate 50 Mg Tabs (Flecainide Acetate) .... Take 1 Tablet By Mouth Two Times A Day  Allergies (verified): 1)  ! Morphine 2)  !  Darvocet 3)  ! * Shellfish 4)  ! * Demoral 5)  ! 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 Multiple unsuccessful pregnancies. One child died in infancy. Multiple adopted Married  Tobacco Use - No.  Alcohol Use - no Grew up in [place],  goes back for 2-3 months a year  Review of Systems  The patient denies anorexia, weight loss, chest pain, syncope, dyspnea on exertion, peripheral edema, hemoptysis, abdominal pain, melena, and hematochezia.         Otherwise reviewed and negative except as outlined.  Vital Signs:  Patient profile:   77 year old female Weight:      110 pounds Pulse rate:   63 / minute BP sitting:   145 / 69  (right arm)  Vitals Entered By: Dreama Saa, CNA (January 01, 2010 3:01 PM)  Physical Exam  Additional Exam:  Normally nourished appearing woman in no acute distress.  HEENT: Conjunctiva and lids are normal, oropharynx clear. Neck: Supple no elevated venous pressure or carotid bruits. Cardiac: regular rate and rhythm, no pathologic systolic murmur. Lungs: Clear to auscultation. Nonlabored breathing at rest. Extremities: No pitting edema. Skin: Warm and dry. Musculoskeletal: No kyphosis. Neuropsychiatric: Alert and oriented x3, affect appropriate.    EKG  Procedure date:  01/01/2010  Findings:      Normal sinus rhythm at 60 beats per minute, normal intervals.  Nuclear Study  Procedure date:  10/04/2008  Findings:      IMPRESSION:   Negative and adequate stress nuclear myocardial study revealing   good exercise tolerance, a negative stress EKG, normal left   ventricular size, normal left ventricular systolic function and   normal myocardial perfusion.  Other findings as noted.  Impression & Recommendations:  Problem # 1:  ATRIAL FIBRILLATION (ICD-427.31)  Becoming more symptomatic, with increased frequency and duration of events. She continues on aspirin for the time being, preferring to avoid Coumadin, and not an optimal candidate for Pradaxa as yet. We discussed initiating flecainide 50 mg p.o. b.i.d., with followup electrocardiogram in one week, and a standard treadmill test to assess for proarrhythmia in 2 weeks. She underwent reassuring ischemic testing last year as noted  above. I will then see her back in one month's time.  Her updated medication list for this problem includes:    Aspirin Ec 325 Mg Tbec (Aspirin) .Marland Kitchen... Take 1 tablet by mouth once a day    Flecainide Acetate 50 Mg Tabs (Flecainide acetate) .Marland Kitchen... Take 1 tablet by mouth two times a day  Orders: EKG w/ Interpretation ([address]) Treadmill (Treadmill)  Her updated medication list for this problem includes:    Aspirin Ec 325 Mg Tbec (Aspirin) .Marland Kitchen... Take 1 tablet by mouth once a day    Flecainide Acetate 50 Mg Tabs (Flecainide acetate) .Marland Kitchen... Take 1 tablet by mouth two times a day  Problem # 2:  HYPERLIPIDEMIA (ICD-272.4)  Continue to focus on diet. I suspect we may ultimately be able to initiate a low-dose statin medication.  Patient Instructions: 1)  Your physician recommends that you schedule a follow-up appointment in: 1 week for EKG and in 1 month 2)  Your physician has requested that you have an exercise tolerance test.  For further information please  visit [email or web address]  Please also follow instruction sheet, as given. 3)  Your physician has recommended you make the following change in your medication: Start taking Flecainide 50mg  by mouth two times a day  Prescriptions: FLECAINIDE ACETATE 50 MG TABS (FLECAINIDE ACETATE) take 1 tablet by mouth two times a day  #60 x 3   Entered by:   Larita Fife Via LPN   Authorized by:   Loreli Slot, MD, [facility]   Signed by:   Larita Fife Via LPN on 47/42/5956   Method used:   Electronically to        [facility] (retail)       [address]       [place], [place]  [address]       Ph: [phone]       Fax: [phone]   RxID:   [id number]   Appended Document: 6 mth f/u per checkout on 06/25/09/tg    Prescriptions: FLECAINIDE ACETATE 50 MG TABS (FLECAINIDE ACETATE) take 1 tablet by mouth two times a day  #60 x 3   Entered by:   Larita Fife Via LPN   Authorized by:   Loreli Slot, MD, [facility]   Signed  by:   Larita Fife Via LPN on 27/12/2374   Method used:   Electronically to        [facility] [phone][facility] (retail)       [address]       [place], [place]  [address]       Ph: [phone] or [phone]       Fax: [phone]   RxID:   [id number]

## 2010-08-21 NOTE — Letter (Signed)
Summary: Appointment Reminder  [facility]  [address]   [place], [place] [address]   Phone: [phone]  Fax: [phone]       October 14, 2009   [name] [address] [place], [place]  [address] 02/14/1926    Dear Ms. Pangborn,  We have been unable to reach you by phone to schedule a follow up   appointment that was recommended for you by Dr. Jena Gauss. It is very   important that we reach you to schedule an appointment. We hope that you  allow [place] to participate in your health care needs. Please contact us at  [phone] at your earliest convenience to schedule your appointment.  Sincerely,    [facility] R. Roetta Sessions, M.D.    Jonette Eva, M.D. Lorenza Burton, FNP-BC    Tana Coast, PA-C Phone: [phone]    Fax: [phone]

## 2010-08-21 NOTE — Miscellaneous (Signed)
**Note De-Identified  Obfuscation** Summary: update medications  Clinical Lists Changes  Medications: Removed medication of TYLENOL 325 MG TABS (ACETAMINOPHEN) as needed Removed medication of * IBUPROFEN once daily prn

## 2010-08-21 NOTE — Assessment & Plan Note (Signed)
**Note De-Identified Symphany Fleissner Obfuscation** Summary: ekg in 1 wk per checkout on 01/01/10/tg  Nurse Visit   Vitals Entered By: Larita Fife Trace Wirick LPN (January 09, 2010 9:08 AM)  Visit Type:  Nurse visit/EKG Referring Provider:  Nobie Putnam Primary Provider:  Dr. Patrica Duel   History of Present Illness: Pt. arrives in office for EKG. She reports no problems since starting Flecainide 50mg  two times a day on last OV of 01-01-10 for Atrial Fibrillation. EKG scanned into chart.   Current Medications (verified): 1)  Felodipine 10 Mg Xr24h-Tab (Felodipine) .... Take 1 Tablet By Mouth Once A Day 2)  Levothroid 25 Mcg Tabs (Levothyroxine Sodium) .... Take 1 Tablet By Mouth Once A Day 3)  Actonel 35 Mg Tabs (Risedronate Sodium) .... Once A Week 4)  Aspirin Ec 325 Mg Tbec (Aspirin) .... Take 1 Tablet By Mouth Once A Day 5)  Fish Oil Concentrate 1000 Mg Caps (Omega-3 Fatty Acids) .... Two Tablets By Mouth Two Times A Day 6)  Miralax  Powd (Polyethylene Glycol 3350) .... As Needed 7)  Xalatan 0.005 % Soln (Latanoprost) .Marland Kitchen.. 1 Drop Both Eyes Once Daily 8)  Calcium Plus D .... Two Tablets Daily 9)  Estrace 0.1 Mg/gm Crea (Estradiol) .... As Directed 10)  Clonazepam 0.5 Mg Tabs (Clonazepam) .... Take 1 Tablet By Mouth Two Times A Day 11)  Multi Vitamin .... Take 1 Tablet By Mouth Once A Day 12)  Genteal Eye Drops .... As Directed 4)  Vagi Gard .... As Needed 14)  Colace 100 Mg Caps (Docusate Sodium) .... One By Mouth As Needed, Several Times Per Week 15)  Prilosec 20 Mg Cpdr (Omeprazole) .... Take 1 Tab Daily 16)  Flecainide Acetate 50 Mg Tabs (Flecainide Acetate) .... Take 1 Tablet By Mouth Two Times A Day  Allergies (verified): 1)  ! Morphine 2)  ! Darvocet 3)  ! * Shellfish 4)  ! * Demoral 5)  ! Codeine  Orders Added: 1)  EKG w/ Interpretation [[address]]

## 2010-08-21 NOTE — Assessment & Plan Note (Signed)
Summary: 1 mth f/u per checkout on 02/19/10/tg   Visit Type:  Follow-up Primary Provider:  Dr.John Phillips Odor   History of Present Illness: 77 year old [name] presents for followup. She reports continued sense of fatigue and functional limitation while taking pindolol and would like to stop the medication. We did discuss resuming flecainide at our last visit for suppression of atrial fibrillation, as she seemed to be tolerating this reasonably well initially, and her followup treadmill did not demonstrate any proarrhythmia. Prior ischemic workup had also been reassuring.  She reports her heart feels more "regular" of late. No major rapid palpitations. She does plan to leave for [place] with her husband in mid-December. No exertional chest pain.  Current Medications (verified): 1)  Felodipine 10 Mg Xr24h-Tab (Felodipine) .... Take 1 Tablet By Mouth Once A Day 2)  Levothroid 25 Mcg Tabs (Levothyroxine Sodium) .... Take 1 Tablet By Mouth Once A Day 3)  Aspirin Ec 325 Mg Tbec (Aspirin) .... Take 1 Tablet By Mouth Once A Day 4)  Fish Oil Concentrate 1000 Mg Caps (Omega-3 Fatty Acids) .... Two Tablets By Mouth Two Times A Day 5)  Xalatan 0.005 % Soln (Latanoprost) .Marland Kitchen.. 1 Drop Both Eyes Once Daily 6)  Calcium Plus D .... Two Tablets Daily 7)  Estrace 0.1 Mg/gm Crea (Estradiol) .... As Directed 8)  Clonazepam 0.5 Mg Tabs (Clonazepam) .... Take 1 Tablet By Mouth Two Times A Day 9)  Multi Vitamin .... Take 1 Tablet By Mouth Once A Day 10)  Genteal Eye Drops .... As Directed 68)  Vagi Gard .... As Needed 12)  Colace 100 Mg Caps (Docusate Sodium) .... One By Mouth As Needed, Several Times Per Week 13)  Omeprazole 20 Mg Cpdr (Omeprazole) .... One By Mouth 30 Mins Before Breakfast Daily 14)  Fiber Therapy .... Once Daily 15)  Flecainide Acetate 50 Mg Tabs (Flecainide Acetate) .... Take 1 Tablet By Mouth Two Times A Day  Allergies (verified): 1)  ! Morphine 2)  ! Darvocet 3)  ! * Shellfish 4)  ! *  Demoral 5)  ! Codeine  Past History:  Social History: Last updated: 10/16/2009 Retired - Special educational needs teacher, [employer], 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 Multiple unsuccessful pregnancies. One child died in infancy. Multiple adopted Married  Tobacco Use - No.  Alcohol Use - no Grew up in [place], goes back for 2-3 months a year  Past Medical History: Atrial Fibrillation - paroxysmal, prefers ASA over coumadin Cerebrovascular Disease - remote TIA Hyperlipidemia Hypertension Hypothyroidism Rheumatic fever Fractured patella Osteoporosis, compression fracture of thoracic spine  Clinical Review Panels:  Echocardiogram Echocardiogram Study Conclusions    - Left ventricle: The cavity size was normal. There was mild focal     basal hypertrophy of the septum. Systolic function was normal. The     estimated ejection fraction was in the range of 55% to 60%. Wall     motion was normal; there were no regional wall motion     abnormalities.   - Mitral valve: Mild to moderate regurgitation.   - Left atrium: The atrium was mildly to moderately dilated.   - Right atrium: The atrium was mildly dilated.   - Atrial septum: There was a patent foramen ovale with left to right     flow.   - Pulmonary arteries: PA peak pressure: 39mm Hg (S).   Impressions:    - Compared to the prior study performed 05/10/08,mitral     regurgitation is more promient. (01/28/2010)    Review of  Systems  The patient denies anorexia, fever, chest pain, syncope, peripheral edema, melena, and hematochezia.         Otherwise reviewed and negative except as outlined.  Vital Signs:  Patient profile:   77 year old female Weight:      112 pounds BMI:     17.87 Pulse rate:   70 / minute BP sitting:   128 / 72  (right arm)  Vitals Entered By: Dreama Saa, CNA (March 28, 2010 8:35 AM)  Physical Exam  Additional Exam:  Normally nourished appearing [name] in no acute distress.  HEENT: Conjunctiva and  lids are normal, oropharynx clear. Neck: Supple no elevated venous pressure or carotid bruits. Cardiac: Regular rate and rhythm, no pathologic systolic murmur. Lungs: Clear to auscultation. Nonlabored breathing at rest. Extremities: No pitting edema. Skin: Warm and dry. Musculoskeletal: No kyphosis. Neuropsychiatric: Alert and oriented x3, affect appropriate.    Impression & Recommendations:  Problem # 1:  ATRIAL FIBRILLATION (ICD-427.31)  Paroxysmal, and in sinus rhythm by examination today. After discussing the situation, plan is to discontinue pindolol, and resume flecainide 50 mg p.o. b.i.d. for suppression of atrial fibrillation. She has already undergone ischemic workup and had a followup treadmill demonstrating no proarrhythmia. She continues to prefer aspirin to Coumadin. I will see her back in 6 weeks.  The following medications were removed from the medication list:    Pindolol 5 Mg Tabs (Pindolol) .Marland Kitchen... Take 1 tab two times a day Her updated medication list for this problem includes:    Aspirin Ec 325 Mg Tbec (Aspirin) .Marland Kitchen... Take 1 tablet by mouth once a day    Flecainide Acetate 50 Mg Tabs (Flecainide acetate) .Marland Kitchen... Take 1 tablet by mouth two times a day  Problem # 2:  MITRAL VALVE DISORDER (ICD-424.0)  Mild to moderate mitral regurgitation by recent echocardiogram.  The following medications were removed from the medication list:    Pindolol 5 Mg Tabs (Pindolol) .Marland Kitchen... Take 1 tab two times a day  Patient Instructions: 1)  Your physician recommends that you schedule a follow-up appointment in: 6 weeks 2)  Your physician has recommended you make the following change in your medication: Stop taking Pindolol and start taking Flecainide 50mg  by mouth two times a day  Prescriptions: FLECAINIDE ACETATE 50 MG TABS (FLECAINIDE ACETATE) take 1 tablet by mouth two times a day  #60 x 3   Entered by:   Larita Fife Via LPN   Authorized by:   Loreli Slot, MD, [facility]   Signed by:    Larita Fife Via LPN on 04/54/0981   Method used:   Electronically to        [facility] [phone]* (retail)       [address]       [place], [place]  [address]       Ph: [phone] or [phone]       Fax: [phone]   RxID:   [id number]

## 2010-08-21 NOTE — Consult Note (Signed)
Summary: [facility]   Imported By: Marylou Mccoy 02/11/2010 08:30:33  _____________________________________________________________________  External Attachment:    Type:   Image     Comment:   External Document

## 2010-08-21 NOTE — Assessment & Plan Note (Signed)
Summary: ROV 6 WEEKS   Visit Type:  Follow-up Primary Provider:  Dr. Assunta Found   History of Present Illness: 77 year old woman presents for followup. She was seen back in September. At that time we discontinued pindolol and resumed flecainide. She did not tolerate beta blocker therapy very well due to bradycardia and fatigue.  She reports generally feeling "good." She has occasional palpitations and sense of irregular heartbeat. I suspect she continues to have paroxysmal atrial fibrillation, in fact is in atrial fibrillation today although rate controlled.  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 She tripped without any loss of consciousness and hit her head. Still has residual ecchymoses in the periorbital region. No other major injuries sustained.  She plans to travel to [place] in December as usual with her husband.  Current Medications (verified): 1)  Felodipine 10 Mg Xr24h-Tab (Felodipine) .... Take 1 Tablet By Mouth Once A Day 2)  Levothroid 25 Mcg Tabs (Levothyroxine Sodium) .... Take 1 Tablet By Mouth Once A Day 3)  Aspirin Ec 325 Mg Tbec (Aspirin) .... Take 1 Tablet By Mouth Once A Day 4)  Fish Oil Concentrate 1000 Mg Caps (Omega-3 Fatty Acids) .... Two Tablets By Mouth Two Times A Day 5)  Xalatan 0.005 % Soln (Latanoprost) .Marland Kitchen.. 1 Drop Both Eyes Once Daily 6)  Calcium Plus D .... Two Tablets Daily 7)  Estrace 0.1 Mg/gm Crea (Estradiol) .... As Directed 8)  Clonazepam 0.5 Mg Tabs (Clonazepam) .... Take 1 Tablet By Mouth Two Times A Day 9)  Multi Vitamin .... Take 1 Tablet By Mouth Once A Day 10)  Genteal Eye Drops .... As Directed 30)  Vagi Gard .... As Needed 12)  Colace 100 Mg Caps (Docusate Sodium) .... One By Mouth As Needed, Several Times Per Week 13)  Omeprazole 20 Mg Cpdr (Omeprazole) .... One By Mouth 30 Mins Before Breakfast Daily 14)  Fiber Therapy .... Once Daily 15)  Flecainide Acetate 50 Mg Tabs (Flecainide Acetate) .... Take 1 Tablet By Mouth  Two Times A Day  Allergies (verified): 1)  ! Morphine 2)  ! Darvocet 3)  ! * Shellfish 4)  ! * Demoral 5)  ! Codeine  Comments:  Nurse/Medical Assistant: patient brought med list also reviewed med list from previous ov the only change is boniva patient stopped this med.  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 Multiple unsuccessful pregnancies. One child died in infancy. Multiple adopted Married  Tobacco Use - No.  Alcohol Use - no Grew up in [place], goes back for 2-3 months a year  Review of Systems       The patient complains of dyspnea on exertion.  The patient denies anorexia, fever, chest pain, syncope, peripheral edema, headaches, melena, and hematochezia.         Otherwise reviewed and negative except as outlined.  Vital Signs:  Patient profile:   77 year old female Weight:      113 pounds BMI:     18.03 Pulse rate:   84 / minute BP sitting:   126 / 77  (right arm)  Vitals Entered By: Dreama Saa, CNA (May 05, 2010 8:53 AM)  Physical Exam  Additional Exam:  Normally nourished appearing woman in  no acute distress.  HEENT: Resolving periorbital ecchymoses following her fall, oropharynx clear. Neck: Supple no elevated venous pressure or carotid bruits. Cardiac: Irregularly irregular, no pathologic systolic murmur. Lungs: Clear to auscultation. Nonlabored breathing at rest. Extremities: No pitting edema. Skin: Warm and dry. Musculoskeletal: No kyphosis. Neuropsychiatric: Alert and oriented x3, affect appropriate.    Impression & Recommendations:  Problem # 1:  ATRIAL FIBRILLATION  (ICD-427.31)  Paroxysmal atrial fibrillation. Patient does not want to take Coumadin, preferring aspirin. Symptomatically she reports feeling fairly well, despite recurrent dysrhythmia. We discussed discontinuing flecainide at some point if she persists in atrial fibrillation and plan a strategy of heart rate control and aspirin going forward. For the time being she is comfortable with the present regimen. I will see her back over the next 3 months when she returns from her travels.  Her updated medication list for this problem includes:    Aspirin Ec 325 Mg Tbec (Aspirin) .Marland Kitchen... Take 1 tablet by mouth once a day    Flecainide Acetate 50 Mg Tabs (Flecainide acetate) .Marland Kitchen... Take 1 tablet by mouth two times a day  Problem # 2:  ESSENTIAL HYPERTENSION, BENIGN (ICD-401.1)  Blood pressure looks reasonable today.  Her updated medication list for this problem includes:    Felodipine 10 Mg Xr24h-tab (Felodipine) .Marland Kitchen... Take 1 tablet by mouth once a day    Aspirin Ec 325 Mg Tbec (Aspirin) .Marland Kitchen... Take 1 tablet by mouth once a day  Patient Instructions: 1)  Your physician recommends that you schedule a follow-up appointment in: 3 months 2)  Your physician recommends that you continue on your current medications as directed. Please refer to the Current Medication list given to you today.

## 2010-08-21 NOTE — Letter (Signed)
Summary: EGD/ED ORDER  EGD/ED ORDER   Imported By: Ave Filter 10/16/2009 16:07:44  _____________________________________________________________________  External Attachment:    Type:   Image     Comment:   External Document

## 2010-08-21 NOTE — Progress Notes (Signed)
Summary: lab order  Phone Note Call from Patient Call back at Home Phone [phone]   Caller: pt Reason for Call: Talk to Nurse Summary of Call: S: pt scheduled follow up appt with Dr Diona Browner. She needs labs put in to have done before appt. Initial call taken by: Faythe Ghee,  Nov 29, 2009 9:37 AM  Follow-up for Phone Call        ordered flp and lft for 12/18/2009 Follow-up by: Teressa Lower RN,  Nov 29, 2009 10:32 AM

## 2010-08-22 NOTE — Letter (Signed)
Summary: AUTH FOR THE RELEASE OF MED INFOR  AUTH FOR THE RELEASE OF MED INFOR   Imported By: Rexene Alberts 06/19/2010 15:03:43  _____________________________________________________________________  External Attachment:    Type:   Image     Comment:   External Document

## 2010-08-22 NOTE — Letter (Signed)
Summary: LABS  LABS   Imported By: Faythe Ghee 06/18/2010 13:42:23  _____________________________________________________________________  External Attachment:    Type:   Image     Comment:   External Document

## 2010-09-18 DIAGNOSIS — I4891 Unspecified atrial fibrillation: Secondary | ICD-10-CM

## 2010-09-22 ENCOUNTER — Encounter: Payer: Self-pay | Admitting: Cardiology

## 2010-10-05 LAB — CK TOTAL AND CKMB (NOT AT ARMC)
CK, MB: 3.8 ng/mL (ref 0.3–4.0)
Relative Index: INVALID (ref 0.0–2.5)
Total CK: 82 U/L (ref 7–177)

## 2010-10-05 LAB — BASIC METABOLIC PANEL
Calcium: 9.1 mg/dL (ref 8.4–10.5)
Calcium: 9.1 mg/dL (ref 8.4–10.5)
Creatinine, Ser: 0.73 mg/dL (ref 0.4–1.2)
GFR calc Af Amer: >60 mL/min (ref >60)
GFR calc Af Amer: >60 mL/min (ref >60)
GFR calc non Af Amer: >60 mL/min (ref >60)
GFR calc non Af Amer: >60 mL/min (ref >60)
Potassium: 3.7 mEq/L (ref 3.5–5.1)
Sodium: 140 mEq/L (ref 135–145)
Sodium: 143 mEq/L (ref 135–145)

## 2010-10-05 LAB — CBC
HCT: 38.4 % (ref 36.0–46.0)
MCH: 33.5 pg (ref 26.0–34.0)
MCV: 97.6 fL (ref 78.0–100.0)
Platelets: 242 10*3/uL (ref 150–400)
RBC: 3.93 MIL/uL (ref 3.87–5.11)

## 2010-10-05 LAB — COMPREHENSIVE METABOLIC PANEL
Albumin: 3.6 g/dL (ref 3.5–5.2)
BUN: 20 mg/dL (ref 6–23)
CO2: 29 mEq/L (ref 19–32)
Chloride: 108 mEq/L (ref 96–112)
Creatinine, Ser: 0.74 mg/dL (ref 0.4–1.2)
GFR calc non Af Amer: >60 mL/min (ref >60)
Glucose, Bld: 101 mg/dL — ABNORMAL HIGH (ref 70–99)
Total Bilirubin: 0.7 mg/dL (ref 0.3–1.2)

## 2010-10-05 LAB — DIFFERENTIAL
Basophils Absolute: 0 10*3/uL (ref 0.0–0.1)
Basophils Relative: 1 % (ref 0–1)
Lymphocytes Relative: 37 % (ref 12–46)
Neutro Abs: 2.8 10*3/uL (ref 1.7–7.7)
Neutrophils Relative %: 47 % (ref 43–77)

## 2010-10-05 LAB — CARDIAC PANEL(CRET KIN+CKTOT+MB+TROPI)
CK, MB: 3.2 ng/mL (ref 0.3–4.0)
Relative Index: INVALID (ref 0.0–2.5)
Troponin I: 0.02 ng/mL (ref 0.00–0.06)

## 2010-10-05 LAB — URINALYSIS, ROUTINE W REFLEX MICROSCOPIC
Glucose, UA: NEGATIVE mg/dL
Ketones, ur: NEGATIVE mg/dL
Nitrite: NEGATIVE
pH: 7 (ref 5.0–8.0)

## 2010-10-05 LAB — POCT CARDIAC MARKERS
CKMB, poc: 2.8 ng/mL (ref 1.0–8.0)
Troponin i, poc: <0.05 ng/mL (ref 0.00–0.09)

## 2010-10-05 LAB — FLECAINIDE LEVEL

## 2010-10-05 LAB — MAGNESIUM: Magnesium: 2.1 mg/dL (ref 1.5–2.5)

## 2010-10-05 LAB — TROPONIN I: Troponin I: 0.02 ng/mL (ref 0.00–0.06)

## 2010-10-13 ENCOUNTER — Ambulatory Visit (INDEPENDENT_AMBULATORY_CARE_PROVIDER_SITE_OTHER): Payer: Medicare Other | Admitting: Cardiology

## 2010-10-13 ENCOUNTER — Encounter: Payer: Self-pay | Admitting: Cardiology

## 2010-10-13 DIAGNOSIS — R5381 Other malaise: Secondary | ICD-10-CM

## 2010-10-13 DIAGNOSIS — I10 Essential (primary) hypertension: Secondary | ICD-10-CM

## 2010-10-13 DIAGNOSIS — I4891 Unspecified atrial fibrillation: Secondary | ICD-10-CM

## 2010-10-13 DIAGNOSIS — R531 Weakness: Secondary | ICD-10-CM | POA: Insufficient documentation

## 2010-10-13 MED ORDER — FELODIPINE ER 5 MG PO TB24: 5.0000 mg | ORAL_TABLET | Freq: Every day | ORAL | Status: DC

## 2010-10-13 NOTE — Progress Notes (Signed)
Clinical Summary [name] is a 77 y.o.female presenting for routine followup. She was seen in November 2011 for followup. No medication changes were made at that time. She has returned from her yearly trip to [place]. She states that her stay was somewhat difficult, she had a respiratory illness while there, and also experienced some rapid palpitations with increased actiivity. In speaking with her in more detail, it seems more apparent that she remains in atrial fibrillation persistently. She describes being weak in general, seems to relate this more to her blood pressure however. No syncope.  Lab work from last November showed potassium 4.4, BUN 26, creatinine 0.9, hemoglobin 13.1, hematocrit 41.4, platelets 354, cholesterol 198, triglycerides 64, HDL 55, LDL 130, TSH 4.7.  She remains hesitant to consider Coumadin. We discussed other medication adjustments.   Allergies  Allergen Reactions  . Codeine     REACTION: UNKNOWN REACTION  . Meperidine Hcl   . Morphine   . Propoxyphene N-Acetaminophen     Current Outpatient Prescriptions on File Prior to Visit  Medication Sig Dispense Refill  . aspirin 325 MG tablet Take 325 mg by mouth daily.        . benzocaine-resorcinol (VAGISIL) 5-2 % vaginal cream Place vaginally as directed.        . Calcium Carbonate-Vitamin D (CALCIUM PLUS VITAMIN D PO) Take 2 tablets by mouth daily.        . clonazePAM (KLONOPIN) 0.5 MG tablet Take 0.5 mg by mouth 2 (two) times daily.        Marland Kitchen docusate sodium (COLACE) 100 MG capsule Take 100 mg by mouth. ONE CAPSULE AS NEEDED, MAY TAKE SEVERAL TIMES PER WEEK.       Marland Kitchen estradiol (ESTRACE) 0.1 MG/GM vaginal cream Place 2 g vaginally as directed.        . felodipine (PLENDIL) 10 MG 24 hr tablet Take 10 mg by mouth daily.        Marland Kitchen FLECAINIDE ACETATE PO Take 50 mg by mouth 2 (two) times daily.        . Hypromellose 0.3 % SOLN Apply to eye as directed.        . latanoprost (XALATAN) 0.005 % ophthalmic solution Place  1 drop into both eyes daily.        Marland Kitchen levothyroxine (SYNTHROID, LEVOTHROID) 50 MCG tablet Take 50 mcg by mouth daily.        . Methylcellulose, Laxative, (FIBER THERAPY PO) Take 1 tablet by mouth daily.        . Multiple Vitamin (MULTIVITAMIN) capsule Take 1 capsule by mouth daily.        . Omega-3 Fatty Acids (FISH OIL CONCENTRATE) 1000 MG CAPS Take 2 capsules by mouth 2 (two) times daily.        Marland Kitchen omeprazole (PRILOSEC) 20 MG capsule Take 20 mg by mouth daily. 30 MINUTES BEFORE BREAKFAST.         Past Medical History  Diagnosis Date  . Cerebrovascular disease     Remote TIA  . Hypothyroidism   . Rheumatic fever   . Fractured patella   . Osteoporosis     Thoracic compression fracture  . Atrial fibrillation     Paroxysmal, prefers ASA over coumadin  . Mixed hyperlipidemia   . Essential hypertension, benign     Social History [name] reports that she quit smoking about 12 years ago. Her smoking use included Cigarettes. She has a 20 pack-year smoking history. She has never used smokeless tobacco. [name]  reports that she does not drink alcohol.  Review of Systems No fevers, chills, or unusual weight change. No chest pain, progressive shortness of breath, cough, hemoptysis, or wheezing. No dysphasia or odynophagia. Stable appetite with no abdominal pain, melena, or hematochezia. No orthopnea, PND, or lower extremity edema. No focal motor weakness, memory problems, or speech deficits. Otherwise systems reviewed and negative except as already outlined.   Physical Examination Filed Vitals:   10/13/10 0835  BP: 116/77  Pulse: 53  Thin elderly woman in no acute distress. HEENT: Resolving periorbital ecchymoses following her fall, oropharynx clear. Neck: Supple no elevated venous pressure or carotid bruits. Cardiac: Irregularly irregular, no pathologic systolic murmur. Lungs: Clear to auscultation. Nonlabored breathing at rest. Extremities: No pitting edema. Skin: Warm  and dry. Musculoskeletal: No kyphosis. Neuropsychiatric: Alert and oriented x3, affect appropriate.  ECG Atrial fibrillation at 60 beats per minute with leftward axis and decreased anterior R-wave progression, nonspecific T wave changes.  Problem List and Plan

## 2010-10-13 NOTE — Assessment & Plan Note (Signed)
Blood pressure well controlled, in fact reportedly low at times when she checks at home. Some systolics below 100. Plan to cut back Plendil to 5 mg daily. She can continue to check blood pressure measurements  at home.

## 2010-10-13 NOTE — Assessment & Plan Note (Signed)
Likely multifactorial. If related to low-grade pressure, hopefully this will improve with reduction in dose of Plendil. She is somewhat bradycardic, however this is not new. No reported sudden dizziness or syncope to suggest pauses.

## 2010-10-13 NOTE — Assessment & Plan Note (Signed)
Persistent atrial fibrillation, on flecainide. I am reluctant to advance this further in this thin elderly woman. Plan will be to discontinue flecainide, continue aspirin at her preference, and simply observe symptomatology. We did discuss the possibility of different antiarrhythmic medications. She has had difficulty tolerating rate control medications, which most recently included pindolol. Followup scheduled over the next 4 weeks.

## 2010-10-13 NOTE — Patient Instructions (Signed)
Your physician recommends that you schedule a follow-up appointment in: 4 weeks Your physician has recommended you make the following change in your medication: decrease Plendil to 5mg  daily and stop taking Flecanide.

## 2010-10-30 ENCOUNTER — Encounter: Payer: Self-pay | Admitting: Cardiology

## 2010-11-12 ENCOUNTER — Ambulatory Visit (INDEPENDENT_AMBULATORY_CARE_PROVIDER_SITE_OTHER): Payer: Medicare Other | Admitting: Cardiology

## 2010-11-12 ENCOUNTER — Encounter: Payer: Self-pay | Admitting: Cardiology

## 2010-11-12 VITALS — BP 131/86 | HR 73 | Ht 66.0 in | Wt 109.0 lb

## 2010-11-12 DIAGNOSIS — I4891 Unspecified atrial fibrillation: Secondary | ICD-10-CM

## 2010-11-12 DIAGNOSIS — I10 Essential (primary) hypertension: Secondary | ICD-10-CM

## 2010-11-12 NOTE — Assessment & Plan Note (Signed)
Paroxysmal at this point. She prefers aspirin to Coumadin, and does not want to pursue any other antiarrhythmic therapies at this time.

## 2010-11-12 NOTE — Patient Instructions (Signed)
Your physician recommends that you continue on your current medications as directed. Please refer to the Current Medication list given to you today.  Your physician recommends that you schedule a follow-up appointment in: 6 months  

## 2010-11-12 NOTE — Assessment & Plan Note (Signed)
Plendil dose was reduced to see if it helped with her fatigue. I asked her to keep a close eye on her blood pressure.

## 2010-11-12 NOTE — Progress Notes (Signed)
Clinical Summary [name] is a 77 y.o.female presenting for followup. She was seen in March 2012. At that time we reduced the dose of Plendil, also discontinued flecainide.  She just recently underwent cataract surgery without obvious difficulty.  Reports similar degree of fatigue, only occasional palpitations however. No exertional chest pain or progressive shortness of breath. As we have discussed many times, I went over the merits of Coumadin versus aspirin for stroke reduction, and she prefers to stay on aspirin. She is also not interested at this time in considering other antiarrhythmic therapies, prefers observation.    Allergies  Allergen Reactions  . Codeine     REACTION: UNKNOWN REACTION  . Meperidine Hcl   . Morphine   . Propoxyphene N-Acetaminophen     Current outpatient prescriptions:aspirin 325 MG tablet, Take 325 mg by mouth daily.  , Disp: , Rfl: ;  benzocaine-resorcinol (VAGISIL) 5-2 % vaginal cream, Place vaginally as directed.  , Disp: , Rfl: ;  Calcium Carbonate-Vitamin D (CALCIUM PLUS VITAMIN D PO), Take 2 tablets by mouth daily.  , Disp: , Rfl: ;  clonazePAM (KLONOPIN) 0.5 MG tablet, Take 0.5 mg by mouth 2 (two) times daily.  , Disp: , Rfl:  docusate sodium (COLACE) 100 MG capsule, Take 100 mg by mouth. ONE CAPSULE AS NEEDED, MAY TAKE SEVERAL TIMES PER WEEK. , Disp: , Rfl: ;  estradiol (ESTRACE) 0.1 MG/GM vaginal cream, Place 2 g vaginally as directed.  , Disp: , Rfl: ;  felodipine (PLENDIL) 5 MG 24 hr tablet, Take 1 tablet (5 mg total) by mouth daily. Take 1 tablet po qd, Disp: 30 tablet, Rfl: 3;  Hypromellose 0.3 % SOLN, Apply to eye as directed.  , Disp: , Rfl:  latanoprost (XALATAN) 0.005 % ophthalmic solution, Place 1 drop into both eyes daily.  , Disp: , Rfl: ;  levothyroxine (SYNTHROID, LEVOTHROID) 50 MCG tablet, Take 50 mcg by mouth daily.  , Disp: , Rfl: ;  Methylcellulose, Laxative, (FIBER THERAPY PO), Take 1 tablet by mouth daily.  , Disp: , Rfl: ;  Multiple  Vitamin (MULTIVITAMIN) capsule, Take 1 capsule by mouth daily.  , Disp: , Rfl:  Omega-3 Fatty Acids (FISH OIL CONCENTRATE) 1000 MG CAPS, Take 2 capsules by mouth 2 (two) times daily.  , Disp: , Rfl: ;  omeprazole (PRILOSEC) 20 MG capsule, Take 20 mg by mouth daily. 30 MINUTES BEFORE BREAKFAST. , Disp: , Rfl:   Past Medical History  Diagnosis Date  . Cerebrovascular disease     Remote TIA  . Hypothyroidism   . Rheumatic fever   . Fractured patella   . Osteoporosis     Thoracic compression fracture  . Atrial fibrillation     Paroxysmal, prefers ASA over coumadin  . Mixed hyperlipidemia   . Essential hypertension, benign     Social History Ms. Korte reports that she quit smoking about 12 years ago. Her smoking use included Cigarettes. She has a 20 pack-year smoking history. She has never used smokeless tobacco. Ms. Algeo reports that she does not drink alcohol.  Review of Systems Otherwise reviewed and negative.  Physical Examination Filed Vitals:   11/12/10 0822  BP: 131/86  Pulse: 73   Thin elderly woman in no acute distress. HEENT: Resolving periorbital ecchymoses following her fall, oropharynx clear. Neck: Supple no elevated venous pressure or carotid bruits. Cardiac: Irregularly irregular, no pathologic systolic murmur. Lungs: Clear to auscultation. Nonlabored breathing at rest. Extremities: No pitting edema. Skin: Warm and dry. Musculoskeletal: No kyphosis.  Neuropsychiatric: Alert and oriented x3, affect appropriate.   Problem List and Plan

## 2010-12-02 NOTE — Assessment & Plan Note (Signed)
[name]                       [name] OFFICE NOTE   Valerie, [name]                    MRN:          [id number]  DATE:10/09/2008                            DOB:          February 25, 1926    PRIMARY CARE PHYSICIAN:  Madelin Rear. Sherwood Gambler, MD   CARDIOLOGIST:  [name], MD   REASON FOR VISIT:  Three-week followup.   HISTORY OF PRESENT ILLNESS:  [name] is an 77 year old female  with a history of paroxysmal atrial fibrillation who has a CHADS2 score  of 4.  She has refused to take Coumadin in the past and she is  maintained on aspirin 325 mg daily.  She recently saw Dr. Diona Browner  secondary to exertional chest pain experienced while traveling in  [place].  She was at significantly high altitudes during the episode.  He set her up for stress Myoview testing.  This was performed on October 04, 2008, and demonstrated normal perfusion.  She also had normal LV  function with an EF of 65%.  She exercised to a workload of 10.1 mets  and had no chest pain, but mild dyspnea.  Her blood pressure rose  appropriately and she had no EKG changes.  She returns for followup  today.  She has had no further chest discomfort or shortness of breath.  She denies palpitations or near syncope.  She denies orthopnea, PND, or  pedal edema.   CURRENT MEDICATIONS:  Felodipine ER 10 mg daily, Levothyroxine 0.025 mg  daily, Actonel 35 mg every week, Multivitamin daily, Aspirin 325 mg  daily, Calcium, Xalatan eye drops,  Fish oil 4 g daily, Clonazepam p.r.n., MiraLax p.r.n., Tylenol p.r.n.   PHYSICAL EXAMINATION:  GENERAL:  She is a well-nourished, well-developed  female in no acute distress.  VITAL SIGNS:  Blood pressure is 118/60, pulse 64, weight 117 pounds.  HEENT:  Normal neck without JVD.  CARDIAC:  Normal S1 and S2.  Regular rate and rhythm.  LUNGS:  Clear to auscultation bilaterally.  ABDOMEN:  Soft, nontender.  EXTREMITIES:  Without  edema.  NEUROLOGIC:  She is alert and oriented x3.  Cranial nerves II through  XII are grossly intact.   ASSESSMENT/PLAN:  1. Exertional chest discomfort.  This is resolved now.  She only      experienced it while she was at high altitudes in [place].  Her      recent stress Myoview study is reassuring with normal myocardial      perfusion and normal left ventricular function.  We discussed this      today and plan on no further workup at this time.  2. Paroxysmal atrial fibrillation.  She refuses to take Coumadin.  Her      CHADS2 score is 4.  She continues on aspirin 325 mg daily.  3. Dyslipidemia.  She would like to follow up on this with Dr.      Diona Browner.  I will therefore set her up for lipids and LFTs.  She      apparently used Lipitor in the past with significant myalgias and      had to  discontinue this.  We can certainly consider something like      Crestor, which may be more tolerable for her.  4. Hypertension.  This is well controlled.   DISPOSITION:  She will follow up with Dr. Diona Browner in 3 months or sooner  p.r.n.      Tereso Newcomer, PA-C  Electronically Signed      Gerrit Friends. Dietrich Pates, MD, [name]  Electronically Signed   SW/MedQ  DD: 10/09/2008  DT: 10/10/2008  Job #: [id number]   cc:   Madelin Rear. Sherwood Gambler, MD

## 2010-12-02 NOTE — Assessment & Plan Note (Signed)
[facility]                       [place] CARDIOLOGY OFFICE NOTE   SRIJA, SOUTHARD                    MRN:          [id number]  DATE:05/09/2008                            DOB:          1926-06-26    REFERRING PHYSICIAN:  Corrie Mckusick, M.D.   REQUESTING PHYSICIAN:  Dr. Phillips Odor   REASON FOR CONSULTATION:  Atrial fibrillation.   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  She also has a  history of rheumatic fever although no major valvular abnormalities  based on an echocardiogram from 2004 at which time, she had evidence of  trivial aortic regurgitation with no significant aortic stenosis and  trivial mitral regurgitation with minor thickening of the valve.  Her  left ventricular ejection fraction was 50% at that time.  She has no  personal history of obstructive coronary artery disease or myocardial  infarction and reports being very active without any major functional  limitation.   She states that she worked hard yesterday and in the evening/early  morning had an episode during which she felt fatigued and dizzy without  any major sense of palpitations.  She ultimately checked her blood  pressure and heart rate with an automatic device and noted her heart  rate to be up around 100, finding her pulse to be irregular.  She  started to feel better, but went into see her primary care physician and  was found to be in atrial fibrillation based on electrocardiogram at  that time.  Review of this tracing demonstrates atrial fibrillation with  controlled ventricular response and nonspecific ST-T wave changes.  It  looks as if some of the precordial leads were also misplaced.  A  followup tracing in our office today shows sinus  rhythm at 68 beats per  minute with decreased anterior R-wave progression, rule out old anterior  infarction.  [name] states that she feels completely back to  baseline at this time.  Review of blood work done this summer shows  normal TSH of 3.55, hemoglobin of 15.2, BUN of 20, creatinine 0.74.  She  has had no frank syncope and denies any significant falls.   ALLERGIES:  MORPHINE, DARVOCET, SHELLFISH, PENICILLIN, and DEMEROL.   PRESENT MEDICATIONS:  1. Felodipine 5 mg p.o. daily.  2. Levothyroxine 0.025 mg p.o. daily.  3. Actonel 35 mg weekly.  4. Multivitamin daily.  5. Aspirin 81 mg p.o. daily.  6. Calcium with vitamin D.  7. Flaxseed oil 2000 mg p.o. daily.   She has p.r.n. clonazepam, alprazolam, and ibuprofen.   PAST MEDICAL HISTORY:  As outlined above.  Additional problems include,  previous fracture of kneecap approximately 2 years ago, prior  tonsillectomy, and previous cesarean section.   SOCIAL HISTORY:  The patient is married.  She is a retired Engineer, civil (consulting).  She  has one child.  She has no tobacco or alcohol use history.  She and her  husband travel to  [place], apparently her original home, once a year  and stay for 1-2 months at a time.   FAMILY HISTORY:  Reviewed.  The patient's father died of colon cancer  and her mother died of a myocardial infarction also massive stroke  at  old age.  She has one sister with a history of atrial fibrillation and  asthma, otherwise noncontributory.   REVIEW OF SYSTEMS:  Outlined above.  She does wear glasses, has a  history of glaucoma and cataracts.  She uses partial dentures upper and  lower.  She has occasional constipation.  No melena or hematochezia.  Appetite has been stable.  Does have some urinary frequency at times.  No dysuria, arthritic complaints, which are mild, otherwise negative.   PHYSICAL EXAMINATION:  VITAL SIGNS:  Blood pressure is 100/78, heart  rate is 60 and regular, weight 118 pounds.  GENERAL:  A  normally nourished-appearing elderly woman seated in no  acute distress.  HEENT:  Conjunctiva is normal.  Oropharynx is clear.  Partial dentures.  NECK:  Supple.  No elevated jugular venous pressure.  No loud bruits.  No thyromegaly.  LUNGS:  Clear without labored breathing at rest.  CARDIAC:  Regular rate and rhythm.  Soft systolic murmur at the base.  Second heart sound is normal.  No S3 gallop or pericardial rub.  ABDOMEN:  Soft, nontender with good bowel sounds.  EXTREMITIES:  Exhibits no pitting edema.  Distal pulses are 2+.  SKIN:  Warm and dry.  MUSCULOSKELETAL:  No kyphosis noted.  NEUROPSYCHIATRIC:  The patient alert and oriented x3.  Affect is  appropriate.   IMPRESSION AND RECOMMENDATIONS:  1. Newly diagnosed atrial fibrillation, presumably recent onset based      on the patient's symptoms of last 24 hours.  This has spontaneously      converted to normal sinus rhythm on our evaluation.  She was not      markedly symptomatic with this episode in one wonders if she has      had paroxysmal atrial fibrillation for sometime.  In light of her      age, history of hypertension, and history of previous transient      ischemic attack, her CHADS2 score is approximately 4.  We did      discuss Coumadin for stroke prophylaxis and she was in agreement      with this.  We plan to establish her in our Coumadin Clinic with a      baseline PT/INR and initiating Coumadin therapy from there.  She      will likely be able to discontinue her low-dose aspirin when she is      on her Coumadin.  We would also like a 2D echocardiogram to follow      up her prior history of rheumatic heart disease and minor      valvular abnormalities noted 5 years ago.  2. Otherwise clinic follow up will be over the next 6 weeks.     [name], MD  Electronically Signed    SGM/MedQ  DD: 05/09/2008  DT: 05/10/2008  Job #: [id number]   cc:   Corrie Mckusick, M.D.

## 2010-12-02 NOTE — Assessment & Plan Note (Signed)
[facility]                       [place] CARDIOLOGY OFFICE NOTE   ERMA, RAICHE                    MRN:          [id number]  DATE:06/22/2008                            DOB:          October 26, 1925    PRIMARY CARE PHYSICIAN:  Corrie Mckusick, MD   REASON FOR VISIT:  Followup atrial fibrillation.   HISTORY OF PRESENT ILLNESS:  Ms. Britz comes back in for routine  visit.  She continues to do very well with only occasional palpitations.  She has had no dizziness, syncope, or bleeding problems and has been  tolerating Coumadin with her most recent INR in the therapeutic range.  She has continued on low-dose aspirin and prefers to do so.  She plans  to travel to [place] with her husband in January and February as is  their typical routine.  We talked about making sure that she had  adequate Coumadin followup while she was there.  She has a sister that  lives there, also on Coumadin.  Otherwise she reports no chest pain or  progressive breathlessness.   ALLERGIES:  MORPHINE, DARVOCET, SHELLFISH, PENICILLIN, DEMEROL.   PRESENT MEDICATIONS:  1. Felodipine 10 mg p.o. daily  2. Levothyroxine 0.025 mg p.o. daily.  3. Actonel 35 mg weekly.  4. Multivitamin once daily.  5. Aspirin 81 mg p.o. daily.  6. Calcium supplements.  7. Xalatan eye drops.  8. Omega-3 supplements 4000 mg daily.  9. Coumadin as directed by the Coumadin Clinic to achieve a      therapeutic INR of 2.0-3.0.   REVIEW OF SYSTEMS:  As outlined above.  Otherwise negative.   PHYSICAL EXAMINATION:  VITAL SIGNS:  Blood pressure today is 130/62,  heart rate is 64 and regular, weight is 120 pounds.  The patient is  comfortable and in no acute distress.  NECK:  No elevated jugular venous pressure, no loud bruits, no  thyromegaly.  LUNGS:  Clear without labored breathing at rest.  CARDIAC:  Regular rate and rhythm, soft basal systolic murmur, preserved  second heart sound.  No  pericardial rub or S3 gallop.  EXTREMITIES:  No pitting edema.   Recent echocardiography done in October demonstrated normal left  ventricular systolic function with mild-to-moderate calcification of  aortic valve, but no clearly describe stenosis.  Mild fibrocalcific  change of aortic root was noted and there was only trivial mitral  regurgitation with mild-to-moderate left atrial enlargement and mild  tricuspid regurgitation.  I reviewed these results with the patient  today.   IMPRESSION AND RECOMMENDATIONS:  Paroxysmal atrial fibrillation.  We  will plan to continue Coumadin for stroke prophylaxis with a CHADS-2  score of 4.  She also prefers to stay on low-dose aspirin and is not  having any bleeding problems.  She is otherwise only describing  occasional brief palpitations and is on no specific rate controlling  medicines at this point.  We will plan to follow her symptomatically and  see her back in March.     [name], MD  Electronically Signed    SGM/MedQ  DD: 06/22/2008  DT: 06/23/2008  Job #: [id number]  cc:   Corrie Mckusick, M.D.

## 2010-12-02 NOTE — Assessment & Plan Note (Signed)
[facility]                       [place] CARDIOLOGY OFFICE NOTE   Valerie, [name]                    MRN:          [id number]  DATE:09/26/2008                            DOB:          July 05, 1926    PRIMARY CARE PHYSICIAN:  Corrie Mckusick, MD   REASON FOR VISIT:  Followup atrial fibrillation.   HISTORY OF PRESENT ILLNESS:  I saw [name] back in December prior  to her usual trip to [place] with her husband.  She is back now and  tells me that while there she had some brief palpitations but nothing  prolonged.  She also decided to stop her Coumadin while she was there  despite any obvious bleeding problems and reported INR in the 2.5 range.  She prefers to take aspirin alone after considering further the  potential risk-benefit ratio from a thromboembolic perspective.  We have  reviewed this on a number of occasions now.  She also tells me that she  has been experiencing some chest discomfort with exertion.  She does  note that during her visit she was at 5000 feet above sea level and also  on uneven terrain.  She has continued to have some episodes of chest  pain even since she has gotten home.  No ischemic evaluation has been  done recently.  Echocardiography was reviewed at our last visit.  Her  left ventricular systolic function is normal.  Today, we talked about a  followup stress test and also intensifying her aspirin at least at 325  mg a day since she has elected to stop Coumadin.   ALLERGIES:  MORPHINE, DARVOCET, SHELLFISH, PENICILLIN, and DEMEROL.   MEDICATIONS:  1. Felodipine 10 mg p.o. daily.  2. Levothyroxine 0.0.025 mg p.o. daily.  3. Actonel 35 mg weekly.  4. Multivitamin daily.  5. Aspirin 81 mg p.o. daily.  6. Calcium with vitamin D2.  7. Xalatan eye drops.  8. Omega-3 supplements 4 g daily.   REVIEW OF SYSTEMS:  Outlined above.  It was negative.   PHYSICAL EXAMINATION:  VITAL SIGNS:  Blood pressure  120/80, heart rate  is 64, weight is 120 pounds.  GENERAL:  The patient is comfortable in no acute distress.  HEENT:  Conjunctiva is normal.  Pharynx clear.  NECK:  Supple.  No elevated jugular venous pressure.  No loud bruits or  thyromegaly.  LUNGS:  Clear breathing at rest.  CARDIAC:  Regular rate and rhythm.  Soft basal systolic murmur,  preserved second heart sound.  ABDOMEN:  Soft, nontender.  EXTREMITIES:  No significant pitting edema.  Pulses are 2+.  There are  peripheral varicosities noted.  SKIN:  Warm and dry.  MUSCULOSKELETAL:  No kyphosis noted.  NEUROPSYCHIATRIC:  The patient is alert and oriented x3.  Affect is  appropriate   IMPRESSION:  1. Episodic exertional chest pain, potentially angina, without      previous diagnosis of obstructive coronary artery disease.  Her      left ventricular systolic function was noted to be normal by      echocardiography in October.  Her resting electrocardiogram is  abnormal with poor anterior R-wave progression, although this may      represent lead placement.  She has not undergone any prior ischemic      evaluation.  We will plan an exercise Myoview.  I will bring her      back to the office over the next 3 or 4 weeks to discuss the      results.  2. History of paroxysmal atrial fibrillation with a CHADS2 score of 4.      Despite her increased thromboembolic risk and our discussion of      that, she prefers to discontinue Coumadin.  I have asked her to      increase her aspirin therefore to 325 mg daily.  Otherwise, she is      not bothered by any frequent sense of palpitations and is in sinus      rhythm on examination today.     Valerie Sidle, MD  Electronically Signed    SGM/MedQ  DD: 09/26/2008  DT: 09/27/2008  Job #: [id number]   cc:   Corrie Mckusick, M.D.

## 2010-12-05 NOTE — Procedures (Signed)
   NAME:  [name], [name]                       ACCOUNT NO.:  [id number]   MEDICAL RECORD NO.:  [id number]                   PATIENT TYPE:  OUT   LOCATION:  RAD                                  FACILITY:  [facility]   PHYSICIAN:  Madaline Savage, M.D.             DATE OF BIRTH:  [birth year]   DATE OF PROCEDURE:  01/24/2003  DATE OF DISCHARGE:                                  ECHOCARDIOGRAM   REASON FOR PROCEDURE:  Embolic CVA.   TECHNICAL ASPECTS:  The technical aspects of this study are good.   RESULTS:  1. Aortic valve: The aortic valve opens and closes normally.  No significant     aortic stenosis noted.  Trivial AI noted.  There is calcification on the     left coronary leaflet.  The valve is probably trileaflet in nature.  2. Mitral valve:  The mitral valve shows trivial thickening.  No evidence of     any calcification or doming or stenosis.  There is trivial regurgitation.     No vegetations, masses, or clots noted.  3. The tricuspid valve grossly normal.  Trace regurgitation.  4. Pulmonic valve poorly seen but probably within normal limits.  5. Left atrium:  Left atrial dimension is 3.9.  No obvious atrial thrombus     seen.  6. Aorta:  Normal aortic root dimension of 2.5.  Right ventricle small.     Chamber thickness not well evaluated.  Right atrium unremarkable in     appearance.  7. Left ventricle:  The left ventricle shows relatively normal segmental     wall motion.  Ejection fraction estimated at 50%.  There was a normal     mitral inflow signal.  The left ventricular apex was reasonably well     seen.  No obvious LV thrombus seen.  8. Pericardium:  There was mild posterior pericardial thickening with     possibly a trivial effusion.  No significant effusion, however.   FINAL DIAGNOSES:  1. LV systolic function low normal at 50% with no evidence of LV thrombus.  2.     No embolic source seen.  3. History of rheumatic fever, but no significant rheumatic regurgitation  or     stenosis of the mitral valve.  4. Aortic valve calcification without stenosis or regurgitation.                                               Madaline Savage, M.D.    WHG/MEDQ  D:  01/24/2003  T:  01/25/2003  Job:  [id number]   cc:   Madelin Rear. Sherwood Gambler, M.D.  [address]  [place]  [place] [address]  Fax: [phone]   [facility]

## 2010-12-05 NOTE — Op Note (Signed)
NAME:  [name], [name]             ACCOUNT NO.:  [id number]   MEDICAL RECORD NO.:  [id number]          PATIENT TYPE:  AMB   LOCATION:  DAY                           FACILITY:  [facility]   PHYSICIAN:  R. Roetta Sessions, M.D. DATE OF BIRTH:  [birth year]   DATE OF PROCEDURE:  11/27/2004  DATE OF DISCHARGE:                                 OPERATIVE REPORT   PROCEDURE:  Screening colonoscopy.   INDICATION FOR PROCEDURE:  The patient is a 77 year old lady with no bowel  symptoms who is referred for colorectal cancer screening by [facility].  She has a positive family history of colon cancer in her  father, who died of the disease at age 89.  She has never had imaging of her  lower GI tract.  Colonoscopy is now being done.  This approach has been  discussed with the patient at length, the potential risks, benefits, and  alternatives have been reviewed, questions answered, she is agreeable.  Please see documentation in the medical record.   PROCEDURE NOTE:  O2 saturation, blood pressure, pulse and respirations were  monitored throughout the entire procedure.   CONSCIOUS SEDATION:  Versed 4 mg IV, 75 mg of fentanyl in divided doses.   SBE prophylaxis in the way of 85 mg gentamicin and 2 g ampicillin prior to  the procedure.   INSTRUMENT USED:  Olympus video chip system.   FINDINGS:  Digital rectal exam revealed no abnormalities.  Endoscopic  findings:  Prep was adequate.   Rectum:  Examination of the rectal mucosa including a retroflexed view of  the anal verge revealed no abnormalities.   Colon:  The colonic mucosa was surveyed from the rectosigmoid junction  through the left, transverse and right colon to the area of the appendiceal  orifice, ileocecal valve and cecum.  These structures were well-seen and  photographed for the record.  From this level the scope was then slowly  withdrawn and all previously-mentioned mucosal surfaces were again seen.  The patient was noted  to have scattered left-sided diverticula.  The  remainder of the colonic mucosa appeared normal.  The patient tolerated the  procedure well and was reacted in endoscopy.   IMPRESSION:  1.  Normal rectum.  2.  Sigmoid diverticula.  3.  The remainder of the colonic mucosa appeared normal.   RECOMMENDATIONS:  1.  Diverticulosis literature provided to Ms. Zanella.  2.  Repeat screening colonoscopy five years.      RMR/MEDQ  D:  11/27/2004  T:  11/27/2004  Job:  [id number]   cc:   [facility]

## 2011-02-09 ENCOUNTER — Other Ambulatory Visit: Payer: Self-pay | Admitting: Cardiology

## 2011-03-02 NOTE — Progress Notes (Signed)
Addended by: Lacie Scotts on: 03/02/2011 10:28 AM   Modules accepted: Orders

## 2011-04-13 ENCOUNTER — Other Ambulatory Visit: Payer: Self-pay | Admitting: Obstetrics and Gynecology

## 2011-04-13 DIAGNOSIS — Z139 Encounter for screening, unspecified: Secondary | ICD-10-CM

## 2011-04-14 LAB — LIPID PANEL: Cholesterol: 207 mg/dL — AB (ref 0–200)

## 2011-04-14 LAB — TSH: TSH: 3.46 u[IU]/mL (ref 0.41–5.90)

## 2011-04-15 LAB — CBC
WBC: 7.6
platelet count: 309

## 2011-04-15 LAB — COMPREHENSIVE METABOLIC PANEL
ALT: 18 U/L (ref 7–35)
Albumin: 4
BUN: 18 mg/dL (ref 4–21)
Calcium: 9.2 mg/dL
Creat: 0.79
Potassium: 4.4 mmol/L
Sodium: 143 mmol/L (ref 137–147)

## 2011-05-07 ENCOUNTER — Encounter: Payer: Self-pay | Admitting: Cardiology

## 2011-05-13 ENCOUNTER — Encounter: Payer: Self-pay | Admitting: Cardiology

## 2011-05-13 ENCOUNTER — Ambulatory Visit (INDEPENDENT_AMBULATORY_CARE_PROVIDER_SITE_OTHER): Payer: Medicare Other | Admitting: Cardiology

## 2011-05-13 VITALS — BP 134/80 | HR 90 | Ht 66.0 in | Wt 113.1 lb

## 2011-05-13 DIAGNOSIS — I10 Essential (primary) hypertension: Secondary | ICD-10-CM

## 2011-05-13 DIAGNOSIS — I4891 Unspecified atrial fibrillation: Secondary | ICD-10-CM

## 2011-05-13 DIAGNOSIS — I059 Rheumatic mitral valve disease, unspecified: Secondary | ICD-10-CM

## 2011-05-13 DIAGNOSIS — E785 Hyperlipidemia, unspecified: Secondary | ICD-10-CM

## 2011-05-13 MED ORDER — WARFARIN SODIUM 2.5 MG PO TABS: ORAL_TABLET | ORAL | Status: DC

## 2011-05-13 NOTE — Assessment & Plan Note (Signed)
No change to current antihypertensive medication. She was not orthostatic today. Some of her dizziness could be CNS, possibly posterior circulation. She did not want to pursue an MRI.

## 2011-05-13 NOTE — Patient Instructions (Addendum)
**Note De-Identified  Obfuscation** Your physician has recommended you make the following change in your medication: start taking Coumadin 2.5 mg daily and stop taking Aspirin when you are therapeutic on Coumadin Misty Stanley will let you know when you are therapeutic)  Your physician recommends that you schedule a follow-up appointment on: Monday (October 29) at 10:00am for Coumadin teaching with Misty Stanley, our Coumadin nurse and in 4 months with cardiologist.

## 2011-05-13 NOTE — Progress Notes (Signed)
Clinical Summary Ms. Fuhrer is a 77 y.o.female presenting for followup. She was seen in April.  She states that she has intermittent palpitations, no chest pain. Feels weak intermittently. In fact, she has had episodes of dizziness, typically in the mornings, and had one event where she had difficulty with speech, that she did not report, and did not seek medical attention. She states that she thought she may have had a "TIA."  Lab work from September showed potassium 4.4, BUN 18, creatinine 0.79, AST 24, ALT 18, hemoglobin 14.5, platelets 309, cholesterol 207, triglycerides 91, HDL 54, LDL 135, TSH 3.4. I reviewed this today.  We discussed indication for anticoagulation with symptoms concerning for TIA or possibly stroke in the setting of atrial fibrillation. We have discussed this on many occasions, and she was in fact on Coumadin for a limited period of time in the past. She is willing to resume Coumadin at this time, which we will replace for aspirin daily. We also discussed the possibility of obtaining a head MRI for more objective evaluation, however she preferred to hold off on this for now.  She was not orthostatic today.  She will be traveling to [place] around the first of the year as is her usual plan. She states that there is a clinic there where she can have Coumadin checked.   Allergies  Allergen Reactions  . Codeine     REACTION: UNKNOWN REACTION  . Meperidine Hcl   . Morphine   . Propoxyphene N-Acetaminophen     Medication list reviewed.  Past Medical History  Diagnosis Date  . Cerebrovascular disease     Remote TIA  . Hypothyroidism   . Rheumatic fever   . Fractured patella   . Osteoporosis     Thoracic compression fracture  . Atrial fibrillation     Paroxysmal, prefers ASA over coumadin  . Mixed hyperlipidemia   . Essential hypertension, benign     Past Surgical History  Procedure Date  . Tonsillectomy   . Cesarean section   . Breast lumpectomy    LEFT  . Patella fracture surgery     LEFT    Family History  Problem Relation Age of Onset  . Heart attack Mother   . Colon cancer Father   . Arrhythmia Sister     Social History Ms. Quintela reports that she quit smoking about 12 years ago. Her smoking use included Cigarettes. She has a 20 pack-year smoking history. She has never used smokeless tobacco. Ms. Vine reports that she does not drink alcohol.  Review of Systems As outlined above. No syncope. No bleeding problems. Otherwise negative.  Physical Examination Filed Vitals:   05/13/11 0835  BP: 134/80  Pulse: 90    Thin elderly woman in no acute distress.  HEENT: Conjunctiva and lids normal, oropharynx clear.  Neck: Supple no elevated venous pressure or carotid bruits.  Cardiac: Irregularly irregular, no pathologic systolic murmur.  Lungs: Clear to auscultation. Nonlabored breathing at rest.  Extremities: No pitting edema.  Skin: Warm and dry.  Musculoskeletal: No kyphosis.  Neuropsychiatric: Alert and oriented x3, affect appropriate.   ECG Atrial fibrillation at 77, poor anterior R-wave progression, nonspecific T wave changes.  Studies Exercise Myoview 10/04/2008: IMPRESSION:   Negative and adequate stress nuclear myocardial study revealing   good exercise tolerance, a negative stress EKG, normal left   ventricular size, normal left ventricular systolic function and   normal myocardial perfusion.  Other findings as noted.  Echocardiogram 01/28/2010: - Left  ventricle: The cavity size was normal. There was mild focal     basal hypertrophy of the septum. Systolic function was normal. The     estimated ejection fraction was in the range of 55% to 60%. Wall     motion was normal; there were no regional wall motion     abnormalities.   - Mitral valve: Mild to moderate regurgitation.   - Left atrium: The atrium was mildly to moderately dilated.   - Right atrium: The atrium was mildly dilated.   - Atrial  septum: There was a patent foramen ovale with left to right     flow.   - Pulmonary arteries: PA peak pressure: 39mm Hg (S).  Problem List and Plan

## 2011-05-13 NOTE — Assessment & Plan Note (Signed)
Recent LDL noted. She is taking omega-3 supplements, although has preferred to hold off statin therapy.

## 2011-05-13 NOTE — Assessment & Plan Note (Signed)
Mild to moderate mitral regurgitation by prior echocardiogram.

## 2011-05-13 NOTE — Assessment & Plan Note (Signed)
Persistent. Over time she has not been able to tolerate rate control strategy very well, not even low-dose pindolol. She also did not want to continue flecainide which we attempted for rhythm control, and is not interested in another antiarrhythmic. Main question at this point is resumption of anticoagulation in light of her possible neurological symptoms noted above, and after discussing Coumadin again, she is in agreement to proceed. She will be re-establishing in our Coumadin clinic, and can discontinue aspirin when she is therapeutic.

## 2011-05-18 ENCOUNTER — Ambulatory Visit (INDEPENDENT_AMBULATORY_CARE_PROVIDER_SITE_OTHER): Payer: Medicare Other | Admitting: *Deleted

## 2011-05-18 ENCOUNTER — Encounter: Payer: Medicare Other | Admitting: *Deleted

## 2011-05-18 DIAGNOSIS — Z7901 Long term (current) use of anticoagulants: Secondary | ICD-10-CM

## 2011-05-18 DIAGNOSIS — I4891 Unspecified atrial fibrillation: Secondary | ICD-10-CM

## 2011-05-18 DIAGNOSIS — G459 Transient cerebral ischemic attack, unspecified: Secondary | ICD-10-CM

## 2011-05-18 DIAGNOSIS — Z139 Encounter for screening, unspecified: Secondary | ICD-10-CM

## 2011-05-18 LAB — POCT INR: INR: 1.1

## 2011-05-21 ENCOUNTER — Ambulatory Visit (HOSPITAL_COMMUNITY)
Admission: RE | Admit: 2011-05-21 | Discharge: 2011-05-21 | Disposition: A | Discharge status: Home / Self Care | Payer: Medicare Other | Source: Ambulatory Visit | Attending: Obstetrics and Gynecology | Admitting: Obstetrics and Gynecology

## 2011-05-21 DIAGNOSIS — Z1231 Encounter for screening mammogram for malignant neoplasm of breast: Secondary | ICD-10-CM | POA: Insufficient documentation

## 2011-05-25 ENCOUNTER — Ambulatory Visit (INDEPENDENT_AMBULATORY_CARE_PROVIDER_SITE_OTHER): Payer: Medicare Other | Admitting: *Deleted

## 2011-05-25 DIAGNOSIS — Z7901 Long term (current) use of anticoagulants: Secondary | ICD-10-CM

## 2011-05-25 DIAGNOSIS — G459 Transient cerebral ischemic attack, unspecified: Secondary | ICD-10-CM

## 2011-05-25 DIAGNOSIS — I4891 Unspecified atrial fibrillation: Secondary | ICD-10-CM

## 2011-05-25 LAB — POCT INR: INR: 2.4

## 2011-06-01 ENCOUNTER — Ambulatory Visit (INDEPENDENT_AMBULATORY_CARE_PROVIDER_SITE_OTHER): Payer: Medicare Other | Admitting: *Deleted

## 2011-06-01 DIAGNOSIS — I4891 Unspecified atrial fibrillation: Secondary | ICD-10-CM

## 2011-06-01 DIAGNOSIS — Z7901 Long term (current) use of anticoagulants: Secondary | ICD-10-CM

## 2011-06-01 DIAGNOSIS — G459 Transient cerebral ischemic attack, unspecified: Secondary | ICD-10-CM

## 2011-06-01 LAB — POCT INR: INR: 3

## 2011-06-10 ENCOUNTER — Ambulatory Visit (INDEPENDENT_AMBULATORY_CARE_PROVIDER_SITE_OTHER): Payer: Medicare Other | Admitting: *Deleted

## 2011-06-10 DIAGNOSIS — Z7901 Long term (current) use of anticoagulants: Secondary | ICD-10-CM

## 2011-06-10 DIAGNOSIS — G459 Transient cerebral ischemic attack, unspecified: Secondary | ICD-10-CM

## 2011-06-10 DIAGNOSIS — I4891 Unspecified atrial fibrillation: Secondary | ICD-10-CM

## 2011-06-25 ENCOUNTER — Ambulatory Visit (INDEPENDENT_AMBULATORY_CARE_PROVIDER_SITE_OTHER): Payer: Medicare Other | Admitting: *Deleted

## 2011-06-25 DIAGNOSIS — G459 Transient cerebral ischemic attack, unspecified: Secondary | ICD-10-CM

## 2011-06-25 DIAGNOSIS — Z7901 Long term (current) use of anticoagulants: Secondary | ICD-10-CM

## 2011-06-25 DIAGNOSIS — I4891 Unspecified atrial fibrillation: Secondary | ICD-10-CM

## 2011-06-25 MED ORDER — WARFARIN SODIUM 2.5 MG PO TABS: ORAL_TABLET | ORAL | Status: DC

## 2011-08-20 ENCOUNTER — Other Ambulatory Visit: Payer: Self-pay | Admitting: Cardiology

## 2011-10-05 ENCOUNTER — Ambulatory Visit (INDEPENDENT_AMBULATORY_CARE_PROVIDER_SITE_OTHER): Payer: Medicare Other | Admitting: *Deleted

## 2011-10-05 DIAGNOSIS — G459 Transient cerebral ischemic attack, unspecified: Secondary | ICD-10-CM | POA: Diagnosis not present

## 2011-10-05 DIAGNOSIS — I4891 Unspecified atrial fibrillation: Secondary | ICD-10-CM

## 2011-10-05 DIAGNOSIS — Z7901 Long term (current) use of anticoagulants: Secondary | ICD-10-CM

## 2011-10-06 ENCOUNTER — Other Ambulatory Visit: Payer: Self-pay | Admitting: *Deleted

## 2011-10-06 MED ORDER — WARFARIN SODIUM 2.5 MG PO TABS: ORAL_TABLET | ORAL | Status: DC

## 2011-10-12 ENCOUNTER — Other Ambulatory Visit: Payer: Self-pay | Admitting: *Deleted

## 2011-10-12 MED ORDER — WARFARIN SODIUM 2.5 MG PO TABS: ORAL_TABLET | ORAL | Status: DC

## 2011-10-13 ENCOUNTER — Encounter (HOSPITAL_COMMUNITY): Payer: Self-pay

## 2011-10-13 ENCOUNTER — Emergency Department (HOSPITAL_COMMUNITY): Payer: Medicare Other

## 2011-10-13 ENCOUNTER — Observation Stay (HOSPITAL_COMMUNITY)
Admission: EM | Admit: 2011-10-13 | Discharge: 2011-10-15 | Disposition: A | Discharge status: Home / Self Care | Payer: Medicare Other | Attending: Orthopaedic Surgery | Admitting: Orthopaedic Surgery

## 2011-10-13 DIAGNOSIS — Z79899 Other long term (current) drug therapy: Secondary | ICD-10-CM | POA: Diagnosis not present

## 2011-10-13 DIAGNOSIS — M25559 Pain in unspecified hip: Secondary | ICD-10-CM | POA: Insufficient documentation

## 2011-10-13 DIAGNOSIS — I4891 Unspecified atrial fibrillation: Secondary | ICD-10-CM | POA: Insufficient documentation

## 2011-10-13 DIAGNOSIS — S32509A Unspecified fracture of unspecified pubis, initial encounter for closed fracture: Principal | ICD-10-CM | POA: Insufficient documentation

## 2011-10-13 DIAGNOSIS — E785 Hyperlipidemia, unspecified: Secondary | ICD-10-CM | POA: Insufficient documentation

## 2011-10-13 DIAGNOSIS — S32599A Other specified fracture of unspecified pubis, initial encounter for closed fracture: Secondary | ICD-10-CM

## 2011-10-13 DIAGNOSIS — Z7901 Long term (current) use of anticoagulants: Secondary | ICD-10-CM | POA: Diagnosis not present

## 2011-10-13 DIAGNOSIS — I10 Essential (primary) hypertension: Secondary | ICD-10-CM | POA: Insufficient documentation

## 2011-10-13 DIAGNOSIS — W19XXXA Unspecified fall, initial encounter: Secondary | ICD-10-CM | POA: Insufficient documentation

## 2011-10-13 DIAGNOSIS — S79919A Unspecified injury of unspecified hip, initial encounter: Secondary | ICD-10-CM | POA: Diagnosis not present

## 2011-10-13 DIAGNOSIS — S79929A Unspecified injury of unspecified thigh, initial encounter: Secondary | ICD-10-CM | POA: Diagnosis not present

## 2011-10-13 LAB — BASIC METABOLIC PANEL
BUN: 15 mg/dL (ref 6–23)
CO2: 28 mEq/L (ref 19–32)
Calcium: 9.2 mg/dL (ref 8.4–10.5)
Chloride: 103 mEq/L (ref 96–112)
Creatinine, Ser: 0.67 mg/dL (ref 0.50–1.10)

## 2011-10-13 LAB — CBC
HCT: 43.3 % (ref 36.0–46.0)
MCH: 32.5 pg (ref 26.0–34.0)
MCV: 95.6 fL (ref 78.0–100.0)
Platelets: 258 10*3/uL (ref 150–400)
RBC: 4.53 MIL/uL (ref 3.87–5.11)
WBC: 9.7 10*3/uL (ref 4.0–10.5)

## 2011-10-13 LAB — PROTIME-INR: Prothrombin Time: 32 seconds — ABNORMAL HIGH (ref 11.6–15.2)

## 2011-10-13 MED ORDER — ACETAMINOPHEN 500 MG PO TABS
1000.0000 mg | ORAL_TABLET | Freq: Once | ORAL | Status: AC
  Administered 2011-10-13: 1000 mg via ORAL
  Filled 2011-10-13: qty 2

## 2011-10-13 MED ORDER — MAGNESIUM HYDROXIDE 400 MG/5ML PO SUSP: 30.0000 mL | Freq: Every day | ORAL | Status: DC | PRN

## 2011-10-13 MED ORDER — TRAMADOL HCL 50 MG PO TABS
50.0000 mg | ORAL_TABLET | Freq: Four times a day (QID) | ORAL | Status: DC | PRN
  Administered 2011-10-14 – 2011-10-15 (×4): 50 mg via ORAL
  Filled 2011-10-13 (×4): qty 1

## 2011-10-13 MED ORDER — ZOLPIDEM TARTRATE 5 MG PO TABS
5.0000 mg | ORAL_TABLET | Freq: Every day | ORAL | Status: DC
  Administered 2011-10-14: 5 mg via ORAL
  Filled 2011-10-13: qty 1

## 2011-10-13 MED ORDER — ACETAMINOPHEN 325 MG PO TABS: 650.0000 mg | ORAL_TABLET | Freq: Four times a day (QID) | ORAL | Status: DC | PRN

## 2011-10-13 NOTE — ED Notes (Signed)
SpO2 rechecked 97% on room air.

## 2011-10-13 NOTE — ED Notes (Signed)
Patient eating food brought in by family. No needs voiced at this time.

## 2011-10-13 NOTE — ED Notes (Signed)
Correction made to IV doc. Per pt IV placed by EMS prior to arrival.

## 2011-10-13 NOTE — ED Provider Notes (Signed)
History     CSN: [id number]  Arrival date & time 10/13/11  4098   First MD Initiated Contact with Patient 10/13/11 1854      Chief Complaint  Patient presents with  . Hip Pain    (Consider location/radiation/quality/duration/timing/severity/associated sxs/prior treatment) HPI Pt presents with c/o left hip pain after mechanical fall tonight. Pt states she has large dogs and was pulling one of them to get it to roll over when she lost her balance and "sat down hard" on her left hip/buttock.  Has not been able to bear weight due to pain.  Did not strike her head, denies neck or back pain.  Movement and palpation make the pain worse.  There are no other associated systemic symptoms.  She has not taken anything for the pain prior to arrival in the ED.  She does take coumadin for atrial fibrillation  Past Medical History  Diagnosis Date  . Cerebrovascular disease     Remote TIA  . Hypothyroidism   . Rheumatic fever   . Fractured patella   . Osteoporosis     Thoracic compression fracture  . Atrial fibrillation     Paroxysmal, prefers ASA over coumadin  . Mixed hyperlipidemia   . Essential hypertension, benign     Past Surgical History  Procedure Date  . Tonsillectomy   . Cesarean section   . Breast lumpectomy     LEFT  . Patella fracture surgery     LEFT    Family History  Problem Relation Age of Onset  . Heart attack Mother   . Colon cancer Father   . Arrhythmia Sister     History  Substance Use Topics  . Smoking status: Former Smoker -- 1.0 packs/day for 20 years    Types: Cigarettes    Quit date: 10/13/1998  . Smokeless tobacco: Never Used  . Alcohol Use: No    OB History    Grav Para Term Preterm Abortions TAB SAB Ect Mult Living                  Review of Systems ROS reviewed and otherwise negative except for mentioned in HPI  Allergies  Propoxyphene n-acetaminophen; Codeine; Meperidine hcl; and Morphine  Home Medications   Current Outpatient Rx    Name Route Sig Dispense Refill  . ACETAMINOPHEN 500 MG PO TABS Oral Take 500 mg by mouth daily as needed. For pain    . CALCIUM-MAGNESIUM-VITAMIN D 500-250-200 MG-MG-UNIT PO TABS Oral Take 1 tablet by mouth 2 (two) times daily.    Marland Kitchen CLONAZEPAM 0.5 MG PO TABS Oral Take 0.5 mg by mouth at bedtime.     Marland Kitchen DOCUSATE SODIUM 100 MG PO CAPS Oral Take 100 mg by mouth at bedtime as needed. ONE CAPSULE AS NEEDED, MAY TAKE SEVERAL TIMES PER WEEK.    Marland Kitchen ESTRADIOL 0.1 MG/GM VA CREA Vaginal Place 2 g vaginally as directed.      Marland Kitchen FELODIPINE ER 5 MG PO TB24  TAKE 1 TABLET BY MOUTH EVERY DAY 90 tablet 0  . HYPROMELLOSE 0.3 % OP SOLN Ophthalmic Apply to eye as directed.      . IBANDRONATE SODIUM 150 MG PO TABS Oral Take 150 mg by mouth every 30 (thirty) days. Take in the morning with a full glass of water, on an empty stomach, and do not take anything else by mouth or lie down for the next 30 min.     Marland Kitchen LATANOPROST 0.005 % OP SOLN Both Eyes Place  1 drop into both eyes at bedtime.     Marland Kitchen LEVOTHYROXINE SODIUM 50 MCG PO TABS Oral Take 50 mcg by mouth daily.      . MULTIVITAMINS PO CAPS Oral Take 1 capsule by mouth every morning.     Marland Kitchen FISH OIL CONCENTRATE 1000 MG PO CAPS Oral Take 2 capsules by mouth 2 (two) times daily.      Marland Kitchen OMEPRAZOLE 20 MG PO CPDR Oral Take 20 mg by mouth daily as needed. 30 MINUTES BEFORE BREAKFAST.--for ACID REFLUX    . POLYETHYLENE GLYCOL 3350 PO PACK Oral Take 17 g by mouth 3 (three) times a week. As needed for constipation    . WARFARIN SODIUM 2.5 MG PO TABS Oral Take 2.5-5 mg by mouth See admin instructions. Take as directed by Anticoagulation clinic.  **Take two tablets daily (5mg ), except take one tablet (2.5mg ) on Tuesdays    . BENZOCAINE-RESORCINOL 5-2 % VA CREA Vaginal Place vaginally as directed.        BP 153/104  Pulse 91  Temp(Src) 97.9 F (36.6 C) (Oral)  Resp 20  Ht 5\' 6"  (1.676 m)  Wt 110 lb (49.896 kg)  BMI 17.75 kg/m2  SpO2 100% Vitals reviewed Physical Exam Physical  Examination: General appearance - alert, well appearing, and in no distress Mental status - alert, oriented to person, place, and time Chest - clear to auscultation, no wheezes, rales or rhonchi, symmetric air entry Heart - normal rate, regular rhythm, normal S1, S2, no murmurs, rubs, clicks or gallops Abdomen - soft, nontender, nondistended, no masses or organomegaly Back exam - full range of motion, no tenderness, palpable spasm or pain on motion Neurological - alert, oriented, normal speech, no focal findings, strength 5/5 in extremities x 4, sensation intact distally Musculoskeletal - no joint tenderness, deformity or swelling, pain with ROM of left hip, ttp in posterior hip/buttock, no deformity Extremities - peripheral pulses normal, no pedal edema, no clubbing or cyanosis Skin - normal coloration and turgor, no rashes, no abrasions or contusions  ED Course  Procedures (including critical care time)  6:56 PM Went to see patient, pharmacy tech is with her reviewing meds.   9:25 PM disucssed with Dr. Hilda Lias, orthopedics, he is willing to admit patient for nonweight bearing status and have PT see her tomorrow.  Pt would prefer this- she has been declining pain medication while in the ED, but states that when she moves or bears weight the pain is unbearable and she has never   Labs Reviewed  BASIC METABOLIC PANEL - Abnormal; Notable for the following:    Glucose, Bld 101 (*)    GFR calc non Af Amer 79 (*)    All other components within normal limits  PROTIME-INR - Abnormal; Notable for the following:    Prothrombin Time 32.0 (*)    INR 3.05 (*)    All other components within normal limits  CBC   Dg Hip Complete Left  10/13/2011  *RADIOLOGY REPORT*  Clinical Data: 78 year old female with left hip pain following injury.  LEFT HIP - COMPLETE 2+ VIEW  Comparison: None  Findings: A left inferior pubic ramus fracture is identified of uncertain chronicity. No other fracture, subluxation or  dislocation identified. No other focal bony lesions are present. Degenerative changes of the lower lumbar spine are noted.  IMPRESSION: Left inferior pubic ramus fracture of uncertain chronicity - correlate with pain.  Original Report Authenticated By: Rosendo Gros, M.D.     1. Inferior pubic ramus  fracture       MDM  Pt presenting with pain in left hip after mechanical fall.  Her leg is neurovascularly intact- pain with attempts at weight bearing and ROM.  Xray shows inferior pubic ramus fracture.  Pt has voided on her own x 2 since fall.  She declines pain meds in the ED despite being offered multiple times by me, however prefers to be admitted because pain is severe with any attempts at movement.  She will be admitted by Dr. Hilda Lias overnight for further management.  Pt is agreeable with this plan.         Ethelda Chick, MD 10/13/11 2147

## 2011-10-13 NOTE — ED Notes (Signed)
Pt denies needs at present. Husband in room with pt.

## 2011-10-13 NOTE — ED Notes (Signed)
Pt reports was trying to treat her dog's stomach and she was trying to get the dog to roll over.  PT fell onto the concrete.  C/O pain to left hip.  No shortening of leg or rotation.  Pedal pulse present.  Denies pain anywhere else.

## 2011-10-13 NOTE — ED Notes (Signed)
Dr. Effie Shy gave verbal for pt to be allowed to take home meds for tonight.

## 2011-10-14 ENCOUNTER — Other Ambulatory Visit: Payer: Self-pay

## 2011-10-14 DIAGNOSIS — M84350A Stress fracture, pelvis, initial encounter for fracture: Secondary | ICD-10-CM | POA: Diagnosis not present

## 2011-10-14 DIAGNOSIS — S32509A Unspecified fracture of unspecified pubis, initial encounter for closed fracture: Secondary | ICD-10-CM | POA: Diagnosis not present

## 2011-10-14 LAB — CBC
HCT: 39.9 % (ref 36.0–46.0)
Hemoglobin: 13.6 g/dL (ref 12.0–15.0)
MCH: 32.9 pg (ref 26.0–34.0)
MCHC: 34.1 g/dL (ref 30.0–36.0)
MCV: 96.4 fL (ref 78.0–100.0)
RBC: 4.14 MIL/uL (ref 3.87–5.11)

## 2011-10-14 LAB — DIFFERENTIAL
Basophils Relative: 1 % (ref 0–1)
Eosinophils Absolute: 0.3 10*3/uL (ref 0.0–0.7)
Eosinophils Relative: 3 % (ref 0–5)
Lymphs Abs: 2 10*3/uL (ref 0.7–4.0)
Monocytes Absolute: 1.1 10*3/uL — ABNORMAL HIGH (ref 0.1–1.0)
Monocytes Relative: 13 % — ABNORMAL HIGH (ref 3–12)

## 2011-10-14 LAB — PROTIME-INR: INR: 2.88 — ABNORMAL HIGH (ref 0.00–1.49)

## 2011-10-14 MED ORDER — FELODIPINE ER 5 MG PO TB24
5.0000 mg | ORAL_TABLET | Freq: Every day | ORAL | Status: DC
  Administered 2011-10-14 – 2011-10-15 (×2): 5 mg via ORAL
  Filled 2011-10-14 (×2): qty 1

## 2011-10-14 MED ORDER — DOCUSATE SODIUM 100 MG PO CAPS
100.0000 mg | ORAL_CAPSULE | Freq: Every day | ORAL | Status: DC
  Administered 2011-10-14 – 2011-10-15 (×2): 100 mg via ORAL
  Filled 2011-10-14 (×2): qty 1

## 2011-10-14 MED ORDER — FELODIPINE ER 5 MG PO TB24: 5.0000 mg | ORAL_TABLET | Freq: Every day | ORAL | Status: DC

## 2011-10-14 MED ORDER — WARFARIN - PHYSICIAN DOSING INPATIENT: Freq: Every day | Status: DC

## 2011-10-14 MED ORDER — WARFARIN SODIUM 2.5 MG PO TABS
2.5000 mg | ORAL_TABLET | Freq: Every day | ORAL | Status: DC
  Administered 2011-10-14: 2.5 mg via ORAL
  Filled 2011-10-14: qty 1

## 2011-10-14 MED ORDER — ESTRADIOL 0.1 MG/GM VA CREA
1.0000 | TOPICAL_CREAM | Freq: Every day | VAGINAL | Status: DC
  Filled 2011-10-14: qty 42.5

## 2011-10-14 MED ORDER — LATANOPROST 0.005 % OP SOLN
1.0000 [drp] | Freq: Every day | OPHTHALMIC | Status: DC
  Administered 2011-10-14: 1 [drp] via OPHTHALMIC
  Filled 2011-10-14: qty 2.5

## 2011-10-14 MED ORDER — CLONAZEPAM 0.5 MG PO TABS
0.5000 mg | ORAL_TABLET | Freq: Every day | ORAL | Status: DC
  Filled 2011-10-14: qty 1

## 2011-10-14 MED ORDER — PANTOPRAZOLE SODIUM 40 MG PO TBEC
40.0000 mg | DELAYED_RELEASE_TABLET | Freq: Every day | ORAL | Status: DC
  Administered 2011-10-14 – 2011-10-15 (×2): 40 mg via ORAL
  Filled 2011-10-14 (×2): qty 1

## 2011-10-14 MED ORDER — LEVOTHYROXINE SODIUM 50 MCG PO TABS
50.0000 ug | ORAL_TABLET | Freq: Every day | ORAL | Status: DC
  Administered 2011-10-14 – 2011-10-15 (×2): 50 ug via ORAL
  Filled 2011-10-14 (×2): qty 1

## 2011-10-14 NOTE — Discharge Instructions (Signed)
[facility] for registered nurse and physical therapy. [facility] for rolling walker and bedside commode.

## 2011-10-14 NOTE — Progress Notes (Signed)
Pt c/o IV located in Left [facility] which is Saline Locked is causing pain.  She request it to be removed and does not wish to have any additional IV's placed.

## 2011-10-14 NOTE — Progress Notes (Signed)
   CARE MANAGEMENT NOTE 10/14/2011  Patient:  [name], [name]   Account Number:  [id number]  Date Initiated:  10/14/2011  Documentation initiated by:  Sharrie Rothman  Subjective/Objective Assessment:   Pt admitted from home with pelvic fx caused by tripping over dog. Pt lives with husband and was independent prior to admission.     Action/Plan:   CM will arrange any HH at discharge. PT consult for pt ordered.   Anticipated DC Date:  10/21/2011   Anticipated DC Plan:  HOME W HOME HEALTH SERVICES      DC Planning Services  CM consult      PAC Choice  DURABLE MEDICAL EQUIPMENT  HOME HEALTH   Choice offered to / List presented to:  C-1 Patient   DME arranged  WALKER - ROLLING  BEDSIDE COMMODE      DME agency  [facility]     HH arranged  HH-1 RN  HH-2 PT      [facility] agency  [facility]   Status of service:  In process, will continue to follow Medicare Important Message given?   (If response is "NO", the following Medicare IM given date fields will be blank) Date Medicare IM given:   Date Additional Medicare IM given:    Discharge Disposition:  HOME W HOME HEALTH SERVICES  Per UR Regulation:    If discussed at Long Length of Stay Meetings, dates discussed:    Comments:  10/14/11 1510 [name], RN BSN CM Pt anticipated discharge on 10/15/11. MD has ordered [facility] RN and PT. Also PT is recommending rolling walker and BSC. Orders obtained from MD and entered. Alroy Bailiff of [facility] will see pt in am and collect information from chart. Tula Nakayama of [employer] will deliver the rolling walker and BSC in the am to the pts room. Pt coumadin is followed by the [facility] cardiology clinic and will been seen by them on April 4 at 11:00. Pt and Pt nurse is aware of discharge disposition and plans.   10/14/11 1349 [name], RN BSN CM Pt admitted with pelvic fx. Pt may need HH at discharge. Will continue to monitor.

## 2011-10-14 NOTE — H&P (Signed)
[name] is an 78 y.o. female.   Chief Complaint: left hip pain HPI: She was getting her dog ready to apply a treatment for a dog rash.  The dog pushed against her and she fell to the floor hurting her left hip.  X-rays in the ER show a nondisplaced left pubic ramis fracture inferiorly.  She has no other injury.  She was unable to stand without pain.  She lives alone.  She is will be admitted for physical therapy and use of a walker and pain control.  She is on Coumadin.  Dr. Phillips Odor is her family physician.  Past Medical History  Diagnosis Date  . Cerebrovascular disease     Remote TIA  . Hypothyroidism   . Rheumatic fever   . Fractured patella   . Osteoporosis     Thoracic compression fracture  . Atrial fibrillation     Paroxysmal, prefers ASA over coumadin  . Mixed hyperlipidemia   . Essential hypertension, benign     Past Surgical History  Procedure Date  . Tonsillectomy   . Cesarean section   . Breast lumpectomy     LEFT  . Patella fracture surgery     LEFT    Family History  Problem Relation Age of Onset  . Heart attack Mother   . Colon cancer Father   . Arrhythmia Sister    Social History:  reports that she quit smoking about 13 years ago. Her smoking use included Cigarettes. She has a 20 pack-year smoking history. She has never used smokeless tobacco. She reports that she does not drink alcohol or use illicit drugs.  Allergies:  Allergies  Allergen Reactions  . Propoxyphene N-Acetaminophen Shortness Of Breath  . Codeine Nausea And Vomiting  . Meperidine Hcl Nausea And Vomiting  . Morphine Nausea And Vomiting    Medications Prior to Admission  Medication Dose Route Frequency Provider Last Rate Last Dose  . acetaminophen (TYLENOL) tablet 1,000 mg  1,000 mg Oral Once Ethelda Chick, MD   1,000 mg at 10/13/11 2101  . acetaminophen (TYLENOL) tablet 650 mg  650 mg Oral Q6H PRN Darreld Mclean, MD      . magnesium hydroxide (MILK OF MAGNESIA) suspension 30  mL  30 mL Oral Daily PRN Darreld Mclean, MD      . traMADol Janean Sark) tablet 50 mg  50 mg Oral Q6H PRN Darreld Mclean, MD   50 mg at 10/14/11 0335  . zolpidem (AMBIEN) tablet 5 mg  5 mg Oral QHS Darreld Mclean, MD       Medications Prior to Admission  Medication Sig Dispense Refill  . clonazePAM (KLONOPIN) 0.5 MG tablet Take 0.5 mg by mouth at bedtime.       . docusate sodium (COLACE) 100 MG capsule Take 100 mg by mouth at bedtime as needed. ONE CAPSULE AS NEEDED, MAY TAKE SEVERAL TIMES PER WEEK.      Marland Kitchen estradiol (ESTRACE) 0.1 MG/GM vaginal cream Place 2 g vaginally as directed.        . felodipine (PLENDIL) 5 MG 24 hr tablet TAKE 1 TABLET BY MOUTH EVERY DAY  90 tablet  0  . Hypromellose 0.3 % SOLN Apply to eye as directed.        . ibandronate (BONIVA) 150 MG tablet Take 150 mg by mouth every 30 (thirty) days. Take in the morning with a full glass of water, on an empty stomach, and do not take anything else by mouth or lie down for  the next 30 min.       . latanoprost (XALATAN) 0.005 % ophthalmic solution Place 1 drop into both eyes at bedtime.       Marland Kitchen levothyroxine (SYNTHROID, LEVOTHROID) 50 MCG tablet Take 50 mcg by mouth daily.        . Multiple Vitamin (MULTIVITAMIN) capsule Take 1 capsule by mouth every morning.       . Omega-3 Fatty Acids (FISH OIL CONCENTRATE) 1000 MG CAPS Take 2 capsules by mouth 2 (two) times daily.        Marland Kitchen omeprazole (PRILOSEC) 20 MG capsule Take 20 mg by mouth daily as needed. 30 MINUTES BEFORE BREAKFAST.--for ACID REFLUX      . warfarin (COUMADIN) 2.5 MG tablet Take 2.5-5 mg by mouth See admin instructions. Take as directed by Anticoagulation clinic.  **Take two tablets daily (5mg ), except take one tablet (2.5mg ) on Tuesdays      . benzocaine-resorcinol (VAGISIL) 5-2 % vaginal cream Place vaginally as directed.          Results for orders placed during the hospital encounter of 10/13/11 (from the past 48 hour(s))  CBC     Status: Normal   Collection Time   10/13/11   8:01 PM      Component Value Range Comment   WBC 9.7  4.0 - 10.5 (K/uL)    RBC 4.53  3.87 - 5.11 (MIL/uL)    Hemoglobin 14.7  12.0 - 15.0 (g/dL)    HCT 16.1  09.6 - 04.5 (%)    MCV 95.6  78.0 - 100.0 (fL)    MCH 32.5  26.0 - 34.0 (pg)    MCHC 33.9  30.0 - 36.0 (g/dL)    RDW 40.9  81.1 - 91.4 (%)    Platelets 258  150 - 400 (K/uL)   BASIC METABOLIC PANEL     Status: Abnormal   Collection Time   10/13/11  8:01 PM      Component Value Range Comment   Sodium 138  135 - 145 (mEq/L)    Potassium 3.5  3.5 - 5.1 (mEq/L)    Chloride 103  96 - 112 (mEq/L)    CO2 28  19 - 32 (mEq/L)    Glucose, Bld 101 (*) 70 - 99 (mg/dL)    BUN 15  6 - 23 (mg/dL)    Creatinine, Ser 7.82  0.50 - 1.10 (mg/dL)    Calcium 9.2  8.4 - 10.5 (mg/dL)    GFR calc non Af Amer 79 (*) >90 (mL/min)    GFR calc Af Amer >90  >90 (mL/min)   PROTIME-INR     Status: Abnormal   Collection Time   10/13/11  8:01 PM      Component Value Range Comment   Prothrombin Time 32.0 (*) 11.6 - 15.2 (seconds)    INR 3.05 (*) 0.00 - 1.49    CBC     Status: Normal   Collection Time   10/14/11  5:54 AM      Component Value Range Comment   WBC 8.7  4.0 - 10.5 (K/uL)    RBC 4.14  3.87 - 5.11 (MIL/uL)    Hemoglobin 13.6  12.0 - 15.0 (g/dL)    HCT 95.6  21.3 - 08.6 (%)    MCV 96.4  78.0 - 100.0 (fL)    MCH 32.9  26.0 - 34.0 (pg)    MCHC 34.1  30.0 - 36.0 (g/dL)    RDW 57.8  46.9 - 62.9 (%)  Platelets 237  150 - 400 (K/uL)   DIFFERENTIAL     Status: Abnormal   Collection Time   10/14/11  5:54 AM      Component Value Range Comment   Neutrophils Relative 61  43 - 77 (%)    Neutro Abs 5.3  1.7 - 7.7 (K/uL)    Lymphocytes Relative 23  12 - 46 (%)    Lymphs Abs 2.0  0.7 - 4.0 (K/uL)    Monocytes Relative 13 (*) 3 - 12 (%)    Monocytes Absolute 1.1 (*) 0.1 - 1.0 (K/uL)    Eosinophils Relative 3  0 - 5 (%)    Eosinophils Absolute 0.3  0.0 - 0.7 (K/uL)    Basophils Relative 1  0 - 1 (%)    Basophils Absolute 0.1  0.0 - 0.1 (K/uL)     Dg Hip Complete Left  10/13/2011  *RADIOLOGY REPORT*  Clinical Data: 78 year old female with left hip pain following injury.  LEFT HIP - COMPLETE 2+ VIEW  Comparison: None  Findings: A left inferior pubic ramus fracture is identified of uncertain chronicity. No other fracture, subluxation or dislocation identified. No other focal bony lesions are present. Degenerative changes of the lower lumbar spine are noted.  IMPRESSION: Left inferior pubic ramus fracture of uncertain chronicity - correlate with pain.  Original Report Authenticated By: Rosendo Gros, M.D.    Review of Systems  Constitutional: Negative.   HENT: Negative.   Eyes: Negative.   Respiratory: Negative.   Cardiovascular: Positive for palpitations.       History of atrial fibrillation on Coumadin, history of mitral valve disorder and hypertension.  Gastrointestinal: Negative.   Genitourinary: Negative.   Musculoskeletal: Positive for falls (Fall last night, hurt left hip area.  Prior surgery left patella five years ago.).  Skin: Negative.   Neurological: Negative.   Endo/Heme/Allergies: Negative.   Psychiatric/Behavioral: Negative.     Blood pressure 113/66, pulse 70, temperature 98.9 F (37.2 C), temperature source Oral, resp. rate 16, height 5\' 6"  (1.676 m), weight 54.5 kg (120 lb 2.4 oz), SpO2 96.00%. Physical Exam  Constitutional: She is oriented to person, place, and time. She appears well-developed and well-nourished.  HENT:  Head: Normocephalic and atraumatic.  Eyes: Conjunctivae and EOM are normal. Pupils are equal, round, and reactive to light.  Neck: Normal range of motion. Neck supple.  Cardiovascular: Normal heart sounds and intact distal pulses.        Irregular rhythm  Respiratory: Effort normal and breath sounds normal.  GI: Soft. Bowel sounds are normal.  Musculoskeletal: She exhibits tenderness (left hip area).       Left hip: She exhibits tenderness and bony tenderness.       Legs: Neurological:  She is alert and oriented to person, place, and time. She has normal reflexes.  Skin: Skin is warm.  Psychiatric: She has a normal mood and affect. Her behavior is normal. Judgment and thought content normal.     Assessment/Plan Fracture left pubic ramis  Begin physical therapy with walker.  Resume Coumadin.  Chancy Smigiel 10/14/2011, 7:33 AM

## 2011-10-14 NOTE — Progress Notes (Signed)
UR Chart Review Completed  

## 2011-10-14 NOTE — Evaluation (Signed)
Physical Therapy Evaluation Patient Details Name: [name] MRN: [id number] DOB: August 04, 1925 Today's Date: 10/14/2011  Problem List:  Patient Active Problem List  Diagnoses  . HYPERLIPIDEMIA  . ESSENTIAL HYPERTENSION, BENIGN  . MITRAL VALVE DISORDER  . Atrial fibrillation  . PULMONARY NODULE  . CONSTIPATION  . WEIGHT LOSS, ABNORMAL  . OTHER DYSPHAGIA  . NONSPECIFIC ABN FINDING RAD & OTH EXAM GI TRACT  . Fatigue  . Encounter for long-term (current) use of anticoagulants  . TIA (transient ischemic attack)    Past Medical History:  Past Medical History  Diagnosis Date  . Cerebrovascular disease     Remote TIA  . Hypothyroidism   . Rheumatic fever   . Fractured patella   . Osteoporosis     Thoracic compression fracture  . Atrial fibrillation     Paroxysmal, prefers ASA over coumadin  . Mixed hyperlipidemia   . Essential hypertension, benign    Past Surgical History:  Past Surgical History  Procedure Date  . Tonsillectomy   . Cesarean section   . Breast lumpectomy     LEFT  . Patella fracture surgery     LEFT    PT Assessment/Plan/Recommendation PT Assessment Clinical Impression Statement: Pt seen for eval as well as ther ex and gait training.  Her pain is well controlled and with correct patterns of motion, she reports minimal pain.  She transfers slowly but adequately OOB and was able to ambulate 25' with walker,  TTWB L.  She should be able to transition to home with husband's assist and HHPT.  She will need a walker and BSC. PT Recommendation/Assessment: Patient will need skilled PT in the acute care venue PT Problem List: Decreased strength;Decreased activity tolerance;Decreased mobility;Decreased knowledge of use of DME;Decreased knowledge of precautions;Pain Barriers to Discharge: None PT Therapy Diagnosis : Difficulty walking;Abnormality of gait;Acute pain PT Plan PT Frequency: 7X/week PT Treatment/Interventions: DME instruction;Gait training;Stair  training;Functional mobility training;Therapeutic exercise;Patient/family education PT Recommendation Follow Up Recommendations: Home health PT Equipment Recommended: Rolling walker with 5" wheels;3 in 1 bedside comode PT Goals  Acute Rehab PT Goals PT Goal Formulation: With patient Time For Goal Achievement: 7 days Pt will go Supine/Side to Sit: Independently PT Goal: Supine/Side to Sit - Progress: Goal set today Pt will go Sit to Supine/Side: with modified independence PT Goal: Sit to Supine/Side - Progress: Goal set today Pt will go Sit to Stand: Independently PT Goal: Sit to Stand - Progress: Goal set today Pt will go Stand to Sit: Independently PT Goal: Stand to Sit - Progress: Goal set today Pt will Ambulate: 16 - 50 feet;with rolling walker;with modified independence PT Goal: Ambulate - Progress: Goal set today Pt will Go Up / Down Stairs: 1-2 stairs;with rail(s);with min assist PT Goal: Up/Down Stairs - Progress: Goal set today  PT Evaluation Precautions/Restrictions  Precautions Precautions: Fall Required Braces or Orthoses: No Restrictions Weight Bearing Restrictions: Yes LLE Weight Bearing: Partial weight bearing Prior Functioning  Home Living Lives With: Spouse Receives Help From: Family Type of Home: House Home Layout: One level Firefighter: Standard Bathroom Accessibility: Yes How Accessible: Accessible via walker Home Adaptive Equipment: Straight cane;Crutches Prior Function Level of Independence: Independent with basic ADLs;Independent with homemaking with ambulation;Independent with gait;Independent with transfers Driving: Yes Vocation: Retired Producer, television/film/video: Awake/alert Overall Cognitive Status: Appears within functional limits for tasks assessed Sensation/Coordination Sensation Light Touch: Appears Intact Stereognosis: Not tested Hot/Cold: Not tested Proprioception: Appears Intact Coordination Gross Motor Movements  are Fluid and Coordinated:  Yes Fine Motor Movements are Fluid and Coordinated: Yes Extremity Assessment RUE Assessment RUE Assessment: Within Functional Limits LUE Assessment LUE Assessment: Within Functional Limits RLE Assessment RLE Assessment: Within Functional Limits LLE Assessment LLE Assessment: Not tested Mobility (including Balance) Bed Mobility Bed Mobility: Yes Supine to Sit: HOB flat;6: Modified independent (Device/Increase time) Transfers Transfers: Yes Sit to Stand: 5: Supervision Sit to Stand Details (indicate cue type and reason): cues for hand placement Stand to Sit: 6: Modified independent (Device/Increase time) Ambulation/Gait Ambulation/Gait: Yes Ambulation/Gait Assistance: 4: Min assist Ambulation/Gait Assistance Details (indicate cue type and reason): cues for correct hand and walker placement Ambulation Distance (Feet): 25 Feet Assistive device: Rolling walker Gait Pattern: Decreased stance time - left;Decreased dorsiflexion - left Stairs: No Wheelchair Mobility Wheelchair Mobility: No  Posture/Postural Control Posture/Postural Control: No significant limitations Balance Balance Assessed: No Exercise  General Exercises - Lower Extremity Ankle Circles/Pumps: AROM;Both;10 reps;Supine Quad Sets: AROM;Both;10 reps;Supine Gluteal Sets: AROM;Both;10 reps;Supine Short Arc Quad: AROM;Left;10 reps;Supine Heel Slides: AAROM;Left;10 reps;Supine Hip ABduction/ADduction: AAROM;Left;10 reps;Supine End of Session PT - End of Session Equipment Utilized During Treatment: Gait belt Activity Tolerance: Patient tolerated treatment well Patient left: in chair;with call bell in reach;with family/visitor present Nurse Communication: Mobility status for transfers;Mobility status for ambulation General Behavior During Session: [facility] for tasks performed Cognition: [facility] for tasks performed  Konrad Penta 10/14/2011, 2:29 PM

## 2011-10-14 NOTE — Progress Notes (Signed)
   CARE MANAGEMENT NOTE 10/14/2011  Patient:  [name], [name]   Account Number:  [id number]  Date Initiated:  10/14/2011  Documentation initiated by:  Sharrie Rothman  Subjective/Objective Assessment:   Pt admitted from home with pelvic fx caused by tripping over dog. Pt lives with husband and was independent prior to admission.     Action/Plan:   CM will arrange any HH at discharge. PT consult for pt ordered.   Anticipated DC Date:  10/21/2011   Anticipated DC Plan:  HOME W HOME HEALTH SERVICES      DC Planning Services  CM consult      Choice offered to / List presented to:             Status of service:  In process, will continue to follow Medicare Important Message given?   (If response is "NO", the following Medicare IM given date fields will be blank) Date Medicare IM given:   Date Additional Medicare IM given:    Discharge Disposition:  HOME W HOME HEALTH SERVICES  Per UR Regulation:    If discussed at Long Length of Stay Meetings, dates discussed:    Comments:  10/14/11 1349 [name], RN BSN CM Pt admitted with pelvic fx. Pt may need HH at discharge. Will continue to monitor.

## 2011-10-15 MED ORDER — TRAMADOL HCL 50 MG PO TABS: 50.0000 mg | ORAL_TABLET | Freq: Four times a day (QID) | ORAL | Status: AC | PRN

## 2011-10-15 NOTE — Progress Notes (Signed)
Physical Therapy Treatment Patient Details Name: [name] MRN: [id number] DOB: [birth year] Today's Date: 10/15/2011  PT Assessment/Plan  PT - Assessment/Plan Comments on Treatment Session: Pt slightly nauseated this AM, but otherwise doing well.  Has been up OOB with walker and nursing assist.  She is ambulating well with walker for functional distances, instructed on steps this AM.            PT Plan: Discharge plan remains appropriate;Frequency remains appropriate PT Goals  Acute Rehab PT Goals PT Goal: Sit to Supine/Side - Progress: Met PT Goal: Sit to Stand - Progress: Met PT Goal: Stand to Sit - Progress: Met PT Goal: Ambulate - Progress: Met Pt will Go Up / Down Stairs: 1-2 stairs PT Goal: Up/Down Stairs - Progress: Met  PT Treatment Precautions/Restrictions  Precautions Precautions: Fall Required Braces or Orthoses: No Restrictions Weight Bearing Restrictions: Yes LLE Weight Bearing: Touchdown weight bearing Mobility (including Balance) Transfers Sit to Stand: 6: Modified independent (Device/Increase time) Stand to Sit: 7: Independent Ambulation/Gait Ambulation/Gait Assistance: 6: Modified independent (Device/Increase time) Ambulation Distance (Feet): 50 Feet Assistive device: Rolling walker Gait Pattern: Step-through pattern;Decreased dorsiflexion - left Stairs: Yes Stairs Assistance: 5: Supervision Stair Management Technique: Step to pattern;Backwards;Forwards;With walker Number of Stairs: 2  Wheelchair Mobility Wheelchair Mobility: No    Exercise    End of Session PT - End of Session Equipment Utilized During Treatment: Gait belt Activity Tolerance: Patient tolerated treatment well;Treatment limited secondary to medical complications (Comment) (slightly nauseated...SN aware) Patient left: in chair;with call bell in reach;with family/visitor present Nurse Communication: Mobility status for transfers;Mobility status for ambulation General Behavior  During Session: [facility] for tasks performed Cognition: [facility] for tasks performed  [name] 10/15/2011, 9:28 AM

## 2011-10-15 NOTE — Progress Notes (Signed)
Patient in stable condition and escorted out by tech.

## 2011-10-15 NOTE — Discharge Summary (Signed)
Physician Discharge Summary  Patient ID: [name] MRN: [id number] DOB/AGE: 12-07-1925 78 y.o.  Admit date: 10/13/2011 Discharge date: 10/15/2011  Admission Diagnoses:Fracture pelvis, left pubic ramus  Discharge Diagnoses:  Active Problems:  * No active hospital problems. *    Discharged Condition: good  Hospital Course: She fell at home after being hit by her dog.  She had left hip pain. She was seen in the ER.  X-rays showed a fracture of the left pubic ramus.  She was unable to sit or walk.  She was admitted, first as an admission then changed to observation after a call from her insurance company.  She received Ultram for pain which helped.  She was seen by physical therapy and is using a walker well.  Home health has been arranged for her at her home.  I will see her in my office in one week.  Her pain is controlled.  She knows how to use the walker.  Consults: hospitalist  Significant Diagnostic Studies:   radiology: X-Ray: pelvis showing a left pubic ramus fracture  Treatments: analgesia: Ultram and physical therapy.  Discharge Exam: Blood pressure 104/69, pulse 73, temperature 98 F (36.7 C), temperature source Oral, resp. rate 17, height 5\' 6"  (1.676 m), weight 54.5 kg (120 lb 2.4 oz), SpO2 93.00%. At time of discharge she is doing well.  Her pain is controlled.  Her examination is negative except for the left hip pain.    Disposition:   Discharge Orders    Future Appointments: Provider: Department: Dept Phone: Center:   10/22/2011 11:00 AM [facility] [phone] [facility]   11/16/2011 9:30 AM [facility] [phone] [facility]     Future Orders Please Complete By Expires   Diet - low sodium heart healthy      Call MD / Call 911      Comments:   If you experience chest pain or shortness of breath, CALL 911 and be transported to the hospital emergency room.  If you develope a fever above 101 F, pus  (white drainage) or increased drainage or redness at the wound, or calf pain, call your surgeon's office.   Constipation Prevention      Comments:   Drink plenty of fluids.  Prune juice may be helpful.  You may use a stool softener, such as Colace (over the counter) 100 mg twice a day.  Use MiraLax (over the counter) for constipation as needed.   Increase activity slowly as tolerated      Weight Bearing as taught in Physical Therapy      Comments:   Use a walker or crutches as instructed.   Discharge instructions      Comments:   Use walker.  Call if any problem.  Appointment to Dr. Hilda [name] at @ 2:10 PM 10/22/11     Medication List  As of 10/15/2011 10:15 AM   TAKE these medications         acetaminophen 500 MG tablet   Commonly known as: TYLENOL   Take 500 mg by mouth daily as needed. For pain      benzocaine-resorcinol 5-2 % vaginal cream   Commonly known as: VAGISIL   Place vaginally as directed.      Calcium-Magnesium-Vitamin D 500-250-200 MG-MG-UNIT Tabs   Take 1 tablet by mouth 2 (two) times daily.      clonazePAM 0.5 MG tablet   Commonly known as: KLONOPIN   Take 0.5 mg by mouth at bedtime.  docusate sodium 100 MG capsule   Commonly known as: COLACE   Take 100 mg by mouth at bedtime as needed. ONE CAPSULE AS NEEDED, MAY TAKE SEVERAL TIMES PER WEEK.      estradiol 0.1 MG/GM vaginal cream   Commonly known as: ESTRACE   Place 2 g vaginally as directed.      felodipine 5 MG 24 hr tablet   Commonly known as: PLENDIL   TAKE 1 TABLET BY MOUTH EVERY DAY      FISH OIL CONCENTRATE 1000 MG Caps   Take 2 capsules by mouth 2 (two) times daily.      Hypromellose 0.3 % Soln   Apply to eye as directed.      ibandronate 150 MG tablet   Commonly known as: BONIVA   Take 150 mg by mouth every 30 (thirty) days. Take in the morning with a full glass of water, on an empty stomach, and do not take anything else by mouth or lie down for the next 30 min.      latanoprost 0.005  % ophthalmic solution   Commonly known as: XALATAN   Place 1 drop into both eyes at bedtime.      levothyroxine 50 MCG tablet   Commonly known as: SYNTHROID, LEVOTHROID   Take 50 mcg by mouth daily.      multivitamin capsule   Take 1 capsule by mouth every morning.      omeprazole 20 MG capsule   Commonly known as: PRILOSEC   Take 20 mg by mouth daily as needed. 30 MINUTES BEFORE BREAKFAST.--for ACID REFLUX      polyethylene glycol packet   Commonly known as: MIRALAX / GLYCOLAX   Take 17 g by mouth 3 (three) times a week. As needed for constipation      traMADol 50 MG tablet   Commonly known as: ULTRAM   Take 1 tablet (50 mg total) by mouth every 6 (six) hours as needed for pain.      warfarin 2.5 MG tablet   Commonly known as: COUMADIN   Take 2.5-5 mg by mouth See admin instructions. Take as directed by Anticoagulation clinic.  **Take two tablets daily (5mg ), except take one tablet (2.5mg ) on Tuesdays           Follow-up Information    Follow up with Valerie Ribas, MD in 2 weeks.      Follow up with [facility] [phone].      Follow up with  [facility] on 10/22/2011. (at 11:00)         To be seen at Dr. Sanjuan Dame office on October 22, 2011 at 2:10 PM.  [phone]/  Use walker.  Home health arranged.  SignedDarreld [name] 10/15/2011, 10:15 AM

## 2011-10-15 NOTE — Progress Notes (Signed)
Subjective: I am doing well   Objective: Vital signs in last 24 hours: Temp:  [98 F (36.7 C)-98.1 F (36.7 C)] 98 F (36.7 C) (03/28 0644) Pulse Rate:  [73-79] 73  (03/28 0644) Resp:  [17-18] 17  (03/28 0644) BP: (104-120)/(67-74) 104/69 mmHg (03/28 0644) SpO2:  [91 %-93 %] 93 % (03/28 0644)  Intake/Output from previous day: 03/27 0701 - 03/28 0700 In: 1110 [P.O.:1110] Out: 550 [Urine:550] Intake/Output this shift:     Basename 10/14/11 0554 10/13/11 2001  HGB 13.6 14.7    Basename 10/14/11 0554 10/13/11 2001  WBC 8.7 9.7  RBC 4.14 4.53  HCT 39.9 43.3  PLT 237 258    Basename 10/13/11 2001  NA 138  K 3.5  CL 103  CO2 28  BUN 15  CREATININE 0.67  GLUCOSE 101*  CALCIUM 9.2    Basename 10/15/11 0433 10/14/11 0904  LABPT -- --  INR 2.38* 2.88*    Neurologically intact Neurovascular intact Sensation intact distally Intact pulses distally  She did well in PT.  Assessment/Plan: Plan to discharge home. To see in office in one week. Home health arranged.    Itali Mckendry 10/15/2011, 10:09 AM

## 2011-10-15 NOTE — Progress Notes (Signed)
   CARE MANAGEMENT NOTE 10/15/2011  Patient:  [name], [name]   Account Number:  [id number]  Date Initiated:  10/14/2011  Documentation initiated by:  Sharrie Rothman  Subjective/Objective Assessment:   Pt admitted from home with pelvic fx caused by tripping over dog. Pt lives with husband and was independent prior to admission.     Action/Plan:   CM will arrange any HH at discharge. PT consult for pt ordered.   Anticipated DC Date:  10/21/2011   Anticipated DC Plan:  HOME W HOME HEALTH SERVICES      DC Planning Services  CM consult      PAC Choice  DURABLE MEDICAL EQUIPMENT  HOME HEALTH   Choice offered to / List presented to:  C-1 Patient   DME arranged  WALKER - ROLLING  BEDSIDE COMMODE      DME agency  [facility]     HH arranged  HH-1 RN  HH-2 PT      [facility] agency  [facility]   Status of service:  Completed, signed off Medicare Important Message given?   (If response is "NO", the following Medicare IM given date fields will be blank) Date Medicare IM given:   Date Additional Medicare IM given:    Discharge Disposition:  HOME W HOME HEALTH SERVICES  Per UR Regulation:    If discussed at Long Length of Stay Meetings, dates discussed:    Comments:  10/15/11 1515 [name], RN BSN CM Pt discharged home. Rolling walker and BSC delivered to pts room. Home health arranged with [facility]. Pt and pt nurse aware of discharge plans.  10/14/11 1510 [name], RN BSN CM Pt anticipated discharge on 10/15/11. MD has ordered [facility] RN and PT. Also PT is recommending rolling walker and BSC. Orders obtained from MD and entered. Alroy Bailiff of [facility] will see pt in am and collect information from chart. Tula Nakayama of [facility] will deliver the rolling walker and BSC in the am to the pts room. Pt coumadin is followed by the [facility] cardiology clinic and will been seen by them on April 4 at 11:00. Pt and Pt nurse is aware of discharge disposition and  plans.   10/14/11 1349 [name], RN BSN CM Pt admitted with pelvic fx. Pt may need HH at discharge. Will continue to monitor.

## 2011-10-15 NOTE — Progress Notes (Signed)
Patient did not sleep well last night.  States she feels bad this am and having some dizziness, although cannot pinpoint what is wrong.

## 2011-10-15 NOTE — Progress Notes (Signed)
Reviewed discharge instructions with patient. Patient voiced understanding. Patient given discharge instructions. Called [facility] and verified prescription is ready for patient to pick up. Patient in stable condition.

## 2011-10-19 ENCOUNTER — Other Ambulatory Visit: Payer: Self-pay | Admitting: *Deleted

## 2011-10-19 DIAGNOSIS — Z8781 Personal history of (healed) traumatic fracture: Secondary | ICD-10-CM

## 2011-10-19 HISTORY — DX: Personal history of (healed) traumatic fracture: Z87.81

## 2011-10-19 MED ORDER — WARFARIN SODIUM 2.5 MG PO TABS: 2.5000 mg | ORAL_TABLET | ORAL | Status: DC

## 2011-10-19 MED ORDER — WARFARIN SODIUM 2.5 MG PO TABS: 2.5000 mg | ORAL_TABLET | Freq: Every day | ORAL | Status: DC

## 2011-10-20 DIAGNOSIS — M810 Age-related osteoporosis without current pathological fracture: Secondary | ICD-10-CM | POA: Diagnosis not present

## 2011-10-20 DIAGNOSIS — M8448XD Pathological fracture, other site, subsequent encounter for fracture with routine healing: Secondary | ICD-10-CM | POA: Diagnosis not present

## 2011-10-20 DIAGNOSIS — I4891 Unspecified atrial fibrillation: Secondary | ICD-10-CM | POA: Diagnosis not present

## 2011-10-20 DIAGNOSIS — I10 Essential (primary) hypertension: Secondary | ICD-10-CM | POA: Diagnosis not present

## 2011-10-21 DIAGNOSIS — I10 Essential (primary) hypertension: Secondary | ICD-10-CM | POA: Diagnosis not present

## 2011-10-21 DIAGNOSIS — M8448XD Pathological fracture, other site, subsequent encounter for fracture with routine healing: Secondary | ICD-10-CM | POA: Diagnosis not present

## 2011-10-21 DIAGNOSIS — I4891 Unspecified atrial fibrillation: Secondary | ICD-10-CM | POA: Diagnosis not present

## 2011-10-21 DIAGNOSIS — M810 Age-related osteoporosis without current pathological fracture: Secondary | ICD-10-CM | POA: Diagnosis not present

## 2011-10-22 ENCOUNTER — Ambulatory Visit (INDEPENDENT_AMBULATORY_CARE_PROVIDER_SITE_OTHER): Payer: Medicare Other | Admitting: *Deleted

## 2011-10-22 DIAGNOSIS — I4891 Unspecified atrial fibrillation: Secondary | ICD-10-CM

## 2011-10-22 DIAGNOSIS — Z7901 Long term (current) use of anticoagulants: Secondary | ICD-10-CM | POA: Diagnosis not present

## 2011-10-22 DIAGNOSIS — G459 Transient cerebral ischemic attack, unspecified: Secondary | ICD-10-CM

## 2011-10-22 DIAGNOSIS — S32509A Unspecified fracture of unspecified pubis, initial encounter for closed fracture: Secondary | ICD-10-CM | POA: Diagnosis not present

## 2011-10-23 ENCOUNTER — Telehealth: Payer: Self-pay | Admitting: Cardiology

## 2011-10-23 DIAGNOSIS — M8448XD Pathological fracture, other site, subsequent encounter for fracture with routine healing: Secondary | ICD-10-CM | POA: Diagnosis not present

## 2011-10-23 DIAGNOSIS — I4891 Unspecified atrial fibrillation: Secondary | ICD-10-CM | POA: Diagnosis not present

## 2011-10-23 DIAGNOSIS — M810 Age-related osteoporosis without current pathological fracture: Secondary | ICD-10-CM | POA: Diagnosis not present

## 2011-10-23 DIAGNOSIS — I10 Essential (primary) hypertension: Secondary | ICD-10-CM | POA: Diagnosis not present

## 2011-10-26 DIAGNOSIS — I4891 Unspecified atrial fibrillation: Secondary | ICD-10-CM | POA: Diagnosis not present

## 2011-10-26 DIAGNOSIS — I10 Essential (primary) hypertension: Secondary | ICD-10-CM | POA: Diagnosis not present

## 2011-10-26 DIAGNOSIS — M810 Age-related osteoporosis without current pathological fracture: Secondary | ICD-10-CM | POA: Diagnosis not present

## 2011-10-26 DIAGNOSIS — M8448XD Pathological fracture, other site, subsequent encounter for fracture with routine healing: Secondary | ICD-10-CM | POA: Diagnosis not present

## 2011-10-27 DIAGNOSIS — M8448XD Pathological fracture, other site, subsequent encounter for fracture with routine healing: Secondary | ICD-10-CM | POA: Diagnosis not present

## 2011-10-27 DIAGNOSIS — I4891 Unspecified atrial fibrillation: Secondary | ICD-10-CM | POA: Diagnosis not present

## 2011-10-27 DIAGNOSIS — M810 Age-related osteoporosis without current pathological fracture: Secondary | ICD-10-CM | POA: Diagnosis not present

## 2011-10-27 DIAGNOSIS — I10 Essential (primary) hypertension: Secondary | ICD-10-CM | POA: Diagnosis not present

## 2011-10-29 DIAGNOSIS — I10 Essential (primary) hypertension: Secondary | ICD-10-CM | POA: Diagnosis not present

## 2011-10-29 DIAGNOSIS — M8448XD Pathological fracture, other site, subsequent encounter for fracture with routine healing: Secondary | ICD-10-CM | POA: Diagnosis not present

## 2011-10-29 DIAGNOSIS — M810 Age-related osteoporosis without current pathological fracture: Secondary | ICD-10-CM | POA: Diagnosis not present

## 2011-10-29 DIAGNOSIS — I4891 Unspecified atrial fibrillation: Secondary | ICD-10-CM | POA: Diagnosis not present

## 2011-11-02 DIAGNOSIS — E119 Type 2 diabetes mellitus without complications: Secondary | ICD-10-CM | POA: Diagnosis not present

## 2011-11-02 DIAGNOSIS — I10 Essential (primary) hypertension: Secondary | ICD-10-CM | POA: Diagnosis not present

## 2011-11-02 DIAGNOSIS — Z681 Body mass index (BMI) 19 or less, adult: Secondary | ICD-10-CM | POA: Diagnosis not present

## 2011-11-02 DIAGNOSIS — E785 Hyperlipidemia, unspecified: Secondary | ICD-10-CM | POA: Diagnosis not present

## 2011-11-02 DIAGNOSIS — E039 Hypothyroidism, unspecified: Secondary | ICD-10-CM | POA: Diagnosis not present

## 2011-11-03 DIAGNOSIS — I10 Essential (primary) hypertension: Secondary | ICD-10-CM | POA: Diagnosis not present

## 2011-11-03 DIAGNOSIS — I4891 Unspecified atrial fibrillation: Secondary | ICD-10-CM | POA: Diagnosis not present

## 2011-11-03 DIAGNOSIS — M8448XD Pathological fracture, other site, subsequent encounter for fracture with routine healing: Secondary | ICD-10-CM | POA: Diagnosis not present

## 2011-11-03 DIAGNOSIS — M810 Age-related osteoporosis without current pathological fracture: Secondary | ICD-10-CM | POA: Diagnosis not present

## 2011-11-04 DIAGNOSIS — I10 Essential (primary) hypertension: Secondary | ICD-10-CM | POA: Diagnosis not present

## 2011-11-04 DIAGNOSIS — I4891 Unspecified atrial fibrillation: Secondary | ICD-10-CM | POA: Diagnosis not present

## 2011-11-04 DIAGNOSIS — M810 Age-related osteoporosis without current pathological fracture: Secondary | ICD-10-CM | POA: Diagnosis not present

## 2011-11-04 DIAGNOSIS — M8448XD Pathological fracture, other site, subsequent encounter for fracture with routine healing: Secondary | ICD-10-CM | POA: Diagnosis not present

## 2011-11-05 DIAGNOSIS — H2510 Age-related nuclear cataract, unspecified eye: Secondary | ICD-10-CM | POA: Diagnosis not present

## 2011-11-05 DIAGNOSIS — H4011X0 Primary open-angle glaucoma, stage unspecified: Secondary | ICD-10-CM | POA: Diagnosis not present

## 2011-11-05 DIAGNOSIS — M8448XD Pathological fracture, other site, subsequent encounter for fracture with routine healing: Secondary | ICD-10-CM | POA: Diagnosis not present

## 2011-11-05 DIAGNOSIS — I4891 Unspecified atrial fibrillation: Secondary | ICD-10-CM | POA: Diagnosis not present

## 2011-11-05 DIAGNOSIS — S32509A Unspecified fracture of unspecified pubis, initial encounter for closed fracture: Secondary | ICD-10-CM | POA: Diagnosis not present

## 2011-11-05 DIAGNOSIS — I10 Essential (primary) hypertension: Secondary | ICD-10-CM | POA: Diagnosis not present

## 2011-11-05 DIAGNOSIS — H409 Unspecified glaucoma: Secondary | ICD-10-CM | POA: Diagnosis not present

## 2011-11-05 DIAGNOSIS — M810 Age-related osteoporosis without current pathological fracture: Secondary | ICD-10-CM | POA: Diagnosis not present

## 2011-11-06 ENCOUNTER — Telehealth: Payer: Self-pay | Admitting: Internal Medicine

## 2011-11-06 NOTE — Telephone Encounter (Deleted)
Pt deceased

## 2011-11-12 DIAGNOSIS — S32509A Unspecified fracture of unspecified pubis, initial encounter for closed fracture: Secondary | ICD-10-CM | POA: Diagnosis not present

## 2011-11-16 ENCOUNTER — Ambulatory Visit (INDEPENDENT_AMBULATORY_CARE_PROVIDER_SITE_OTHER): Payer: Medicare Other | Admitting: *Deleted

## 2011-11-16 DIAGNOSIS — Z7901 Long term (current) use of anticoagulants: Secondary | ICD-10-CM

## 2011-11-16 DIAGNOSIS — I4891 Unspecified atrial fibrillation: Secondary | ICD-10-CM | POA: Diagnosis not present

## 2011-11-16 DIAGNOSIS — G459 Transient cerebral ischemic attack, unspecified: Secondary | ICD-10-CM | POA: Diagnosis not present

## 2011-12-07 ENCOUNTER — Ambulatory Visit (INDEPENDENT_AMBULATORY_CARE_PROVIDER_SITE_OTHER): Payer: Medicare Other | Admitting: *Deleted

## 2011-12-07 DIAGNOSIS — I4891 Unspecified atrial fibrillation: Secondary | ICD-10-CM

## 2011-12-07 DIAGNOSIS — Z7901 Long term (current) use of anticoagulants: Secondary | ICD-10-CM

## 2011-12-07 DIAGNOSIS — G459 Transient cerebral ischemic attack, unspecified: Secondary | ICD-10-CM

## 2011-12-10 ENCOUNTER — Other Ambulatory Visit: Payer: Self-pay | Admitting: Cardiology

## 2011-12-10 DIAGNOSIS — S32509A Unspecified fracture of unspecified pubis, initial encounter for closed fracture: Secondary | ICD-10-CM | POA: Diagnosis not present

## 2011-12-10 MED ORDER — WARFARIN SODIUM 2.5 MG PO TABS: 2.5000 mg | ORAL_TABLET | ORAL | Status: DC

## 2011-12-10 NOTE — Telephone Encounter (Signed)
Rx sent into pharmacy, pt notified

## 2011-12-10 NOTE — Telephone Encounter (Signed)
PT NEEDS RX CALLED IN TO [facility] [place].  COUMADIN  SHE STATES SHE HAS REFILL FOR MAIL ORDER BUT THEY ONLY GAVE HER #90 DAYS AND SHE TAKES MORE THEN ONE PILL A DAY.  PLEASE CALL NEW RX WITH CORRECT # OF PILLS TO [facility] THANKS

## 2011-12-10 NOTE — Telephone Encounter (Signed)
lmom to call back/ct

## 2011-12-17 ENCOUNTER — Ambulatory Visit (INDEPENDENT_AMBULATORY_CARE_PROVIDER_SITE_OTHER): Payer: Medicare Other | Admitting: Cardiology

## 2011-12-17 ENCOUNTER — Encounter: Payer: Self-pay | Admitting: Cardiology

## 2011-12-17 VITALS — BP 137/75 | HR 70 | Resp 16 | Ht 66.0 in | Wt 115.0 lb

## 2011-12-17 DIAGNOSIS — I10 Essential (primary) hypertension: Secondary | ICD-10-CM

## 2011-12-17 DIAGNOSIS — I4891 Unspecified atrial fibrillation: Secondary | ICD-10-CM

## 2011-12-17 NOTE — Patient Instructions (Signed)
**Note De-identified  Obfuscation** Your physician recommends that you continue on your current medications as directed. Please refer to the Current Medication list given to you today.  Your physician recommends that you schedule a follow-up appointment in: 6 months  

## 2011-12-17 NOTE — Assessment & Plan Note (Signed)
Persistent. Continue Coumadin and observation. She has not tolerated rate control medications, did not tolerate flecainide, and is not interested in other antiarrhythmics or cardioversion.

## 2011-12-17 NOTE — Assessment & Plan Note (Signed)
No change to blood pressure regimen. 

## 2011-12-17 NOTE — Progress Notes (Signed)
Clinical Summary [name] is a 78 y.o.female presenting for followup. She was seen in October 2012. She was initiated on Coumadin at that time, has been followed in the Coumadin clinic. Last INR was 2.0.  She returned from regular trip to [place] earlier in the year. Shortly thereafter had a fall and pelvic fracture, did not require surgery. She is essentially back to baseline, no longer using a walker, does have some lower back pain.  She is not bothered by any progressive palpitations. Still has some sense of dysequilibrium. Has otherwise been tolerating Coumadin without spontaneous bleeding.   Allergies  Allergen Reactions  . Propoxyphene-Acetaminophen Shortness Of Breath  . Codeine Nausea And Vomiting  . Meperidine Hcl Nausea And Vomiting  . Morphine Nausea And Vomiting    Current Outpatient Prescriptions  Medication Sig Dispense Refill  . acetaminophen (TYLENOL) 500 MG tablet Take 500 mg by mouth daily as needed. For pain      . benzocaine-resorcinol (VAGISIL) 5-2 % vaginal cream Place vaginally as directed.        . Calcium-Magnesium-Vitamin D 500-250-200 MG-MG-UNIT TABS Take 1 tablet by mouth 2 (two) times daily.      . clonazePAM (KLONOPIN) 0.5 MG tablet Take 0.5 mg by mouth at bedtime.       . docusate sodium (COLACE) 100 MG capsule Take 100 mg by mouth at bedtime as needed. ONE CAPSULE AS NEEDED, MAY TAKE SEVERAL TIMES PER WEEK.      Marland Kitchen estradiol (ESTRACE) 0.1 MG/GM vaginal cream Place 2 g vaginally as directed.        . felodipine (PLENDIL) 5 MG 24 hr tablet TAKE 1 TABLET BY MOUTH EVERY DAY  90 tablet  0  . Hypromellose 0.3 % SOLN Apply to eye as directed.        . ibandronate (BONIVA) 150 MG tablet Take 150 mg by mouth every 30 (thirty) days. Take in the morning with a full glass of water, on an empty stomach, and do not take anything else by mouth or lie down for the next 30 min.       . latanoprost (XALATAN) 0.005 % ophthalmic solution Place 1 drop into both eyes  at bedtime.       Marland Kitchen levothyroxine (SYNTHROID, LEVOTHROID) 50 MCG tablet Take 50 mcg by mouth daily.        . Multiple Vitamin (MULTIVITAMIN) capsule Take 1 capsule by mouth every morning.       . Omega-3 Fatty Acids (FISH OIL CONCENTRATE) 1000 MG CAPS Take 2 capsules by mouth 2 (two) times daily.        Marland Kitchen omeprazole (PRILOSEC) 20 MG capsule Take 20 mg by mouth daily as needed. 30 MINUTES BEFORE BREAKFAST.--for ACID REFLUX      . polyethylene glycol (MIRALAX / GLYCOLAX) packet Take 17 g by mouth 3 (three) times a week. As needed for constipation      . warfarin (COUMADIN) 2.5 MG tablet Take 1 tablet (2.5 mg total) by mouth as directed.  60 tablet  2  . DISCONTD: Calcium Carbonate-Vitamin D (CALCIUM PLUS VITAMIN D PO) Take 2 tablets by mouth daily.          Past Medical History  Diagnosis Date  . Cerebrovascular disease     Remote TIA  . Hypothyroidism   . Rheumatic fever   . Fractured patella   . Osteoporosis     Thoracic compression fracture  . Atrial fibrillation     Paroxysmal, prefers ASA over coumadin  .  Mixed hyperlipidemia   . Essential hypertension, benign     Social History [name] reports that she quit smoking about 13 years ago. Her smoking use included Cigarettes. She has a 20 pack-year smoking history. She has never used smokeless tobacco. [name] reports that she does not drink alcohol.  Review of Systems No chest pain, no syncope. Stable appetite. Otherwise negative.  Physical Examination Filed Vitals:   12/17/11 1534  BP: 137/75  Pulse: 70  Resp: 16    Thin elderly woman in no acute distress.  HEENT: Conjunctiva and lids normal, oropharynx clear.  Neck: Supple no elevated venous pressure or carotid bruits.  Cardiac: Irregularly irregular, no pathologic systolic murmur.  Lungs: Clear to auscultation. Nonlabored breathing at rest.  Extremities: No pitting edema.    Problem List and Plan   Atrial fibrillation Persistent. Continue Coumadin  and observation. She has not tolerated rate control medications, did not tolerate flecainide, and is not interested in other antiarrhythmics or cardioversion.  ESSENTIAL HYPERTENSION, BENIGN No change to blood pressure regimen.     [name], M.D., F.A.C.C.

## 2012-01-04 ENCOUNTER — Ambulatory Visit (INDEPENDENT_AMBULATORY_CARE_PROVIDER_SITE_OTHER): Payer: Medicare Other | Admitting: *Deleted

## 2012-01-04 DIAGNOSIS — G459 Transient cerebral ischemic attack, unspecified: Secondary | ICD-10-CM | POA: Diagnosis not present

## 2012-01-04 DIAGNOSIS — I4891 Unspecified atrial fibrillation: Secondary | ICD-10-CM

## 2012-01-04 DIAGNOSIS — Z7901 Long term (current) use of anticoagulants: Secondary | ICD-10-CM

## 2012-01-07 DIAGNOSIS — S32509A Unspecified fracture of unspecified pubis, initial encounter for closed fracture: Secondary | ICD-10-CM | POA: Diagnosis not present

## 2012-02-01 ENCOUNTER — Ambulatory Visit (INDEPENDENT_AMBULATORY_CARE_PROVIDER_SITE_OTHER): Payer: Medicare Other | Admitting: *Deleted

## 2012-02-01 DIAGNOSIS — I4891 Unspecified atrial fibrillation: Secondary | ICD-10-CM | POA: Diagnosis not present

## 2012-02-01 DIAGNOSIS — G459 Transient cerebral ischemic attack, unspecified: Secondary | ICD-10-CM | POA: Diagnosis not present

## 2012-02-01 DIAGNOSIS — Z7901 Long term (current) use of anticoagulants: Secondary | ICD-10-CM

## 2012-02-01 LAB — POCT INR: INR: 3.1

## 2012-02-23 ENCOUNTER — Ambulatory Visit (INDEPENDENT_AMBULATORY_CARE_PROVIDER_SITE_OTHER): Payer: Medicare Other | Admitting: Family Medicine

## 2012-02-23 ENCOUNTER — Encounter: Payer: Self-pay | Admitting: Family Medicine

## 2012-02-23 VITALS — BP 128/74 | HR 84 | Temp 97.7°F | Ht 66.5 in | Wt 112.5 lb

## 2012-02-23 DIAGNOSIS — R1013 Epigastric pain: Secondary | ICD-10-CM | POA: Insufficient documentation

## 2012-02-23 DIAGNOSIS — R918 Other nonspecific abnormal finding of lung field: Secondary | ICD-10-CM

## 2012-02-23 DIAGNOSIS — K219 Gastro-esophageal reflux disease without esophagitis: Secondary | ICD-10-CM

## 2012-02-23 DIAGNOSIS — E782 Mixed hyperlipidemia: Secondary | ICD-10-CM | POA: Diagnosis not present

## 2012-02-23 DIAGNOSIS — E119 Type 2 diabetes mellitus without complications: Secondary | ICD-10-CM | POA: Insufficient documentation

## 2012-02-23 DIAGNOSIS — I4891 Unspecified atrial fibrillation: Secondary | ICD-10-CM

## 2012-02-23 DIAGNOSIS — F410 Panic disorder [episodic paroxysmal anxiety] without agoraphobia: Secondary | ICD-10-CM | POA: Insufficient documentation

## 2012-02-23 DIAGNOSIS — F411 Generalized anxiety disorder: Secondary | ICD-10-CM

## 2012-02-23 DIAGNOSIS — E039 Hypothyroidism, unspecified: Secondary | ICD-10-CM | POA: Diagnosis not present

## 2012-02-23 DIAGNOSIS — M810 Age-related osteoporosis without current pathological fracture: Secondary | ICD-10-CM | POA: Insufficient documentation

## 2012-02-23 DIAGNOSIS — E785 Hyperlipidemia, unspecified: Secondary | ICD-10-CM

## 2012-02-23 DIAGNOSIS — F419 Anxiety disorder, unspecified: Secondary | ICD-10-CM

## 2012-02-23 DIAGNOSIS — R1011 Right upper quadrant pain: Secondary | ICD-10-CM | POA: Diagnosis not present

## 2012-02-23 MED ORDER — CLONAZEPAM 0.5 MG PO TABS: 0.5000 mg | ORAL_TABLET | Freq: Every day | ORAL | Status: DC

## 2012-02-23 MED ORDER — OMEPRAZOLE 20 MG PO CPDR: 20.0000 mg | DELAYED_RELEASE_CAPSULE | Freq: Two times a day (BID) | ORAL | Status: DC

## 2012-02-23 NOTE — Assessment & Plan Note (Addendum)
Chronic, stable on coumadin. followed by Dr. Diona Browner. Sounds like she's in chronic afib.  Declines antiarrhythmic.

## 2012-02-23 NOTE — Patient Instructions (Addendum)
Pass by [name]'s office to schedule abdominal ultrasound. Increase omeprazole to 20mg  twice daily, let me know if this isn't helping. Info on prolia provided today.  May be beneficial to take this medicine. Good to see you today, call us with quesitons. Return for medicare wellness visit, prior fasting for blood work.

## 2012-02-23 NOTE — Assessment & Plan Note (Addendum)
Suspicious for gallstone etiology so will obtain abd [place] to eval this, will call pt with results.

## 2012-02-23 NOTE — Assessment & Plan Note (Signed)
chronic.  diet controlled. Will need A1c when returns fasting for blood work.

## 2012-02-23 NOTE — Progress Notes (Signed)
Subjective:    Patient ID: [name], female    DOB: 05-Apr-1926, 78 y.o.   MRN: [id number]  HPI CC: new pt to establish  Prior saw Dr. Phillips Odor, wanted change.  Goes to [place] yearly.  Born and raised in [place].  3 mo h/o RUQ pain, travels to scapular region on right.  Worse with fatty foods at night time.  Never happens in morning.  Tramadol helps pain.  No recent abd [place].  Some nausea.  No fevers.  No emesis.  Lasts hours or until takes tramadol.  Crescendo pain at RUQ.  H/o osteoporosis - severe by T score and h/o compression fracture.  On bisphosphonate for several years (>5) in past.  Told to take boniva but as feels has been on this for years, would rather not be on bisphosphonate anymore.  Interested in prolia.  Would like more info on this.  H/o pelvic fracture 09/2011.  H/o afib - has been on and off coumadin.  afib for several years.  Sounds chronically in afib.  Only on felodipine.  Now back on coumadin.  Sees Dr Diona Browner in [place]. Lab Results  Component Value Date   INR 3.1 02/01/2012   INR 3.2 01/04/2012   INR 2.8 12/07/2011    H/o pulm nodules - had 2 ct scans and told didn't need f/u.  Born and raised in [place].  H/o ulcers, improved on nexium.  H/o GERD.  Recently started daily omeprazole 20mg  because of increase in symptoms. Wt Readings from Last 3 Encounters:  02/23/12 112 lb 8 oz (51.03 kg)  12/17/11 115 lb (52.164 kg)  10/13/11 120 lb 2.4 oz (54.5 kg)   Preventative: Unsure latest medicare wellness visit.  No evidence of recent one in records received. Last colonoscopy was about 2 yrs ago, rec no need to repeat.  Father did die of colon cancer.  GI - in [place] Mammogram last 03/2011 WNL, due next month, schedules herself.  Medications and allergies reviewed and updated in chart.  Past histories reviewed and updated if relevant as below. Patient Active Problem List  Diagnosis  . HYPERLIPIDEMIA  . ESSENTIAL HYPERTENSION, BENIGN  .  MITRAL VALVE DISORDER  . Atrial fibrillation  . PULMONARY NODULE  . CONSTIPATION  . WEIGHT LOSS, ABNORMAL  . OTHER DYSPHAGIA  . NONSPECIFIC ABN FINDING RAD & OTH EXAM GI TRACT  . Fatigue  . Encounter for long-term (current) use of anticoagulants  . TIA (transient ischemic attack)   Past Medical History  Diagnosis Date  . History of TIA (transient ischemic attack) remote  . Hypothyroidism   . History of rheumatic fever   . Osteoporosis     Thoracic compression fracture, on bisphosphonate and cal/vit D  . Atrial fibrillation     Paroxysmal, prefers ASA over coumadin  . Mixed hyperlipidemia   . Essential hypertension, benign   . Diabetes type 2, controlled     diet controlled  . Pulmonary nodules     stable bilateral on CT 05/2010, rec rpt yearly for 2 yrs, decided to stop f/u  . Mitral valve prolapse   . Aortic stenosis   . Anemia   . Arthritis   . History of skin cancer   . Anxiety     with panic attacks  . Diverticulosis     by colonoscopy  . GERD (gastroesophageal reflux disease)   . Glaucoma   . Seasonal allergies   . History of pelvic fracture April 2013   Past Surgical History  Procedure Date  . Tonsillectomy   . Cesarean section     (and h/o 6 miscarriages)  . Breast lumpectomy 1983    LEFT, benign  . Patella fracture surgery 2004    LEFT surgically repaired with pins and wire  . Dexa 05/2010    T score -4.5 spine, -2.4 femur; does not want to repeat  . Cardiovascular stress test 2010    negative  . Cataract extraction 2012    RIGHT   History  Substance Use Topics  . Smoking status: Former Smoker -- 1.0 packs/day for 20 years    Types: Cigarettes    Quit date: 10/13/1998  . Smokeless tobacco: Never Used  . Alcohol Use: No   Family History  Problem Relation Age of Onset  . Coronary artery disease Mother 12    MI deceased  . Colon cancer Father 9  . Arrhythmia Sister   . Stroke Paternal Grandmother   . Diabetes Neg Hx    Allergies    Allergen Reactions  . Propoxyphene-Acetaminophen Shortness Of Breath  . Codeine Nausea And Vomiting  . Meperidine Hcl Nausea And Vomiting  . Morphine Nausea And Vomiting   Current Outpatient Prescriptions on File Prior to Visit  Medication Sig Dispense Refill  . acetaminophen (TYLENOL) 500 MG tablet Take 500 mg by mouth daily as needed. For pain      . Calcium-Magnesium-Vitamin D 500-250-200 MG-MG-UNIT TABS Take 1 tablet by mouth 2 (two) times daily.      Marland Kitchen docusate sodium (COLACE) 100 MG capsule Take 100 mg by mouth at bedtime as needed. ONE CAPSULE AS NEEDED, MAY TAKE SEVERAL TIMES PER WEEK.      . felodipine (PLENDIL) 5 MG 24 hr tablet TAKE 1 TABLET BY MOUTH EVERY DAY  90 tablet  0  . latanoprost (XALATAN) 0.005 % ophthalmic solution Place 1 drop into both eyes at bedtime.       Marland Kitchen levothyroxine (SYNTHROID, LEVOTHROID) 50 MCG tablet Take 50 mcg by mouth daily.        . Multiple Vitamin (MULTIVITAMIN) capsule Take 1 capsule by mouth every morning.       . Omega-3 Fatty Acids (FISH OIL CONCENTRATE) 1000 MG CAPS Take 2 capsules by mouth 2 (two) times daily.        . polyethylene glycol (MIRALAX / GLYCOLAX) packet Take 17 g by mouth 3 (three) times a week. As needed for constipation      . warfarin (COUMADIN) 2.5 MG tablet Take 1 tablet (2.5 mg total) by mouth as directed.  60 tablet  2  . DISCONTD: clonazePAM (KLONOPIN) 0.5 MG tablet Take 0.5 mg by mouth 2 (two) times daily as needed.       Marland Kitchen DISCONTD: omeprazole (PRILOSEC) 20 MG capsule Take 20 mg by mouth daily as needed. 30 MINUTES BEFORE BREAKFAST.--for ACID REFLUX      . estradiol (ESTRACE) 0.1 MG/GM vaginal cream Place 2 g vaginally as directed.        . ibandronate (BONIVA) 150 MG tablet Take 150 mg by mouth every 30 (thirty) days. Take in the morning with a full glass of water, on an empty stomach, and do not take anything else by mouth or lie down for the next 30 min.       Marland Kitchen DISCONTD: Calcium Carbonate-Vitamin D (CALCIUM PLUS VITAMIN  D PO) Take 2 tablets by mouth daily.           Review of Systems  Constitutional: Negative for fever, chills, activity  change, appetite change, fatigue and unexpected weight change.  HENT: Negative for hearing loss and neck pain.   Eyes: Negative for visual disturbance.  Respiratory: Negative for cough, chest tightness, shortness of breath and wheezing.   Cardiovascular: Positive for chest pain (s/p eval by cards negative for cardiac cause, though GERD related). Negative for palpitations and leg swelling.  Gastrointestinal: Positive for nausea, abdominal pain and diarrhea. Negative for vomiting, constipation, blood in stool and abdominal distention.  Genitourinary: Negative for hematuria and difficulty urinating.  Musculoskeletal: Negative for myalgias and arthralgias.  Skin: Negative for rash.  Neurological: Negative for dizziness, seizures, syncope and headaches.  Hematological: Does not bruise/bleed easily.  Psychiatric/Behavioral: Negative for dysphoric mood. The patient is nervous/anxious (with panic attacks).        Objective:   Physical Exam  Nursing note and vitals reviewed. Constitutional: She is oriented to person, place, and time. She appears well-developed and well-nourished. No distress.  HENT:  Head: Normocephalic and atraumatic.  Right Ear: Hearing, tympanic membrane, external ear and ear canal normal.  Left Ear: Hearing, tympanic membrane, external ear and ear canal normal.  Nose: Nose normal.  Mouth/Throat: Oropharynx is clear and moist. No oropharyngeal exudate.  Eyes: Conjunctivae and EOM are normal. Pupils are equal, round, and reactive to light. No scleral icterus.  Neck: Normal range of motion. Neck supple. No thyromegaly present.  Cardiovascular: Normal rate, normal heart sounds and intact distal pulses.  An irregularly irregular rhythm present.  No murmur heard. Pulses:      Radial pulses are 2+ on the right side, and 2+ on the left side.  Pulmonary/Chest:  Effort normal and breath sounds normal. No respiratory distress. She has no wheezes. She has no rales.  Abdominal: Soft. Bowel sounds are normal. She exhibits no distension and no mass. There is tenderness (mild RUQ/epigastric). There is no rebound and no guarding.  Musculoskeletal: Normal range of motion. She exhibits no edema.       Diabetic foot exam: Normal inspection No skin breakdown No calluses  Normal DP/PT pulses Normal sensation to light touch and monofilament Nails normal.  Lymphadenopathy:    She has no cervical adenopathy.  Neurological: She is alert and oriented to person, place, and time.       CN grossly intact, station and gait intact  Skin: Skin is warm and dry. No rash noted.  Psychiatric: She has a normal mood and affect. Her behavior is normal. Judgment and thought content normal.      Assessment & Plan:

## 2012-02-23 NOTE — Assessment & Plan Note (Signed)
longterm bisphosphonates (>several years), not interested in continuing 2.2 concern for side effects..   Discussed prolia, pt requested information to read up on this med. May start next visit.

## 2012-02-23 NOTE — Assessment & Plan Note (Addendum)
Check FLP next visit.  Continues fish oil 2gm bid, states statins "never worked for me"

## 2012-02-24 ENCOUNTER — Encounter: Payer: Self-pay | Admitting: Family Medicine

## 2012-02-25 ENCOUNTER — Ambulatory Visit
Admission: RE | Admit: 2012-02-25 | Discharge: 2012-02-25 | Disposition: A | Discharge status: Home / Self Care | Payer: Medicare Other | Source: Ambulatory Visit | Attending: Family Medicine | Admitting: Family Medicine

## 2012-02-25 DIAGNOSIS — R1011 Right upper quadrant pain: Secondary | ICD-10-CM

## 2012-02-25 DIAGNOSIS — I700 Atherosclerosis of aorta: Secondary | ICD-10-CM | POA: Diagnosis not present

## 2012-02-26 ENCOUNTER — Encounter: Payer: Self-pay | Admitting: Family Medicine

## 2012-02-29 ENCOUNTER — Ambulatory Visit (INDEPENDENT_AMBULATORY_CARE_PROVIDER_SITE_OTHER): Payer: Medicare Other | Admitting: *Deleted

## 2012-02-29 DIAGNOSIS — I4891 Unspecified atrial fibrillation: Secondary | ICD-10-CM | POA: Diagnosis not present

## 2012-02-29 DIAGNOSIS — G459 Transient cerebral ischemic attack, unspecified: Secondary | ICD-10-CM

## 2012-02-29 DIAGNOSIS — Z7901 Long term (current) use of anticoagulants: Secondary | ICD-10-CM | POA: Diagnosis not present

## 2012-02-29 LAB — POCT INR: INR: 2.2

## 2012-03-03 ENCOUNTER — Other Ambulatory Visit: Payer: Medicare Other

## 2012-03-03 ENCOUNTER — Other Ambulatory Visit (INDEPENDENT_AMBULATORY_CARE_PROVIDER_SITE_OTHER): Payer: Medicare Other

## 2012-03-03 ENCOUNTER — Telehealth: Payer: Self-pay | Admitting: Family Medicine

## 2012-03-03 DIAGNOSIS — R1011 Right upper quadrant pain: Secondary | ICD-10-CM

## 2012-03-03 DIAGNOSIS — M810 Age-related osteoporosis without current pathological fracture: Secondary | ICD-10-CM | POA: Diagnosis not present

## 2012-03-03 DIAGNOSIS — E782 Mixed hyperlipidemia: Secondary | ICD-10-CM

## 2012-03-03 DIAGNOSIS — E119 Type 2 diabetes mellitus without complications: Secondary | ICD-10-CM

## 2012-03-03 LAB — CBC WITH DIFFERENTIAL/PLATELET
Basophils Absolute: 0 10*3/uL (ref 0.0–0.1)
Eosinophils Relative: 2.7 % (ref 0.0–5.0)
HCT: 43.6 % (ref 36.0–46.0)
Hemoglobin: 14.1 g/dL (ref 12.0–15.0)
Lymphocytes Relative: 36.3 % (ref 12.0–46.0)
Monocytes Relative: 9.9 % (ref 3.0–12.0)
Neutro Abs: 3.7 10*3/uL (ref 1.4–7.7)
RDW: 14.2 % (ref 11.5–14.6)
WBC: 7.3 10*3/uL (ref 4.5–10.5)

## 2012-03-03 LAB — COMPREHENSIVE METABOLIC PANEL
AST: 26 U/L (ref 0–37)
Albumin: 3.9 g/dL (ref 3.5–5.2)
BUN: 15 mg/dL (ref 6–23)
CO2: 30 mEq/L (ref 19–32)
Calcium: 9.4 mg/dL (ref 8.4–10.5)
Chloride: 103 mEq/L (ref 96–112)
Creatinine, Ser: 0.7 mg/dL (ref 0.4–1.2)
GFR: 89.02 mL/min (ref >60.00)
Glucose, Bld: 102 mg/dL — ABNORMAL HIGH (ref 70–99)

## 2012-03-03 LAB — LDL CHOLESTEROL, DIRECT: Direct LDL: 140.9 mg/dL

## 2012-03-03 LAB — LIPID PANEL
Cholesterol: 216 mg/dL — ABNORMAL HIGH (ref 0–200)
VLDL: 14.6 mg/dL (ref 0.0–40.0)

## 2012-03-03 LAB — MICROALBUMIN / CREATININE URINE RATIO
Creatinine,U: 150.2 mg/dL
Microalb Creat Ratio: 3.5 mg/g (ref 0.0–30.0)

## 2012-03-03 NOTE — Telephone Encounter (Signed)
Opened in error

## 2012-03-04 LAB — VITAMIN D 25 HYDROXY (VIT D DEFICIENCY, FRACTURES): Vit D, 25-Hydroxy: 51 ng/mL (ref 30–89)

## 2012-03-21 ENCOUNTER — Other Ambulatory Visit: Payer: Self-pay | Admitting: Cardiology

## 2012-03-28 ENCOUNTER — Ambulatory Visit (INDEPENDENT_AMBULATORY_CARE_PROVIDER_SITE_OTHER): Payer: Medicare Other | Admitting: *Deleted

## 2012-03-28 DIAGNOSIS — Z7901 Long term (current) use of anticoagulants: Secondary | ICD-10-CM | POA: Diagnosis not present

## 2012-03-28 DIAGNOSIS — G459 Transient cerebral ischemic attack, unspecified: Secondary | ICD-10-CM

## 2012-03-28 DIAGNOSIS — I4891 Unspecified atrial fibrillation: Secondary | ICD-10-CM | POA: Diagnosis not present

## 2012-03-28 LAB — POCT INR: INR: 1.8

## 2012-03-29 ENCOUNTER — Ambulatory Visit (INDEPENDENT_AMBULATORY_CARE_PROVIDER_SITE_OTHER): Payer: Medicare Other | Admitting: Family Medicine

## 2012-03-29 ENCOUNTER — Encounter: Payer: Self-pay | Admitting: Family Medicine

## 2012-03-29 VITALS — BP 118/78 | HR 80 | Temp 98.2°F | Wt 108.5 lb

## 2012-03-29 DIAGNOSIS — E119 Type 2 diabetes mellitus without complications: Secondary | ICD-10-CM

## 2012-03-29 DIAGNOSIS — Z1239 Encounter for other screening for malignant neoplasm of breast: Secondary | ICD-10-CM

## 2012-03-29 DIAGNOSIS — M810 Age-related osteoporosis without current pathological fracture: Secondary | ICD-10-CM | POA: Diagnosis not present

## 2012-03-29 DIAGNOSIS — R1011 Right upper quadrant pain: Secondary | ICD-10-CM

## 2012-03-29 DIAGNOSIS — Z0000 Encounter for general adult medical examination without abnormal findings: Secondary | ICD-10-CM | POA: Diagnosis not present

## 2012-03-29 MED ORDER — IBANDRONATE SODIUM 150 MG PO TABS: 150.0000 mg | ORAL_TABLET | ORAL | Status: DC

## 2012-03-29 MED ORDER — LEVOTHYROXINE SODIUM 50 MCG PO TABS: 50.0000 ug | ORAL_TABLET | Freq: Every day | ORAL | Status: DC

## 2012-03-29 MED ORDER — ESOMEPRAZOLE MAGNESIUM 40 MG PO CPDR: 40.0000 mg | DELAYED_RELEASE_CAPSULE | Freq: Every day | ORAL | Status: DC

## 2012-03-29 NOTE — Progress Notes (Signed)
Subjective:    Patient ID: [name], female    DOB: 1925/12/20, 78 y.o.   MRN: [id number]  HPI CC: medicare wellness visit  abd pain -RUQ pain improved, but now having worsening epigastric pain described as constant gnawing.  Has decreased portion sizes.  Knows certain foods exacerbate sxs such as tomatoes.  On omeprazole 20mg  bid. Brings copy of colonoscopy/endoscopy from 2011.  EGD showing chronic gastritis and duodenitis with biopsied ulcers, small HH.  Colonoscopy showing int hemorrhoids and left sided transverse diverticulosis w/o normal.  rec f/u prn.  Lab Results  Component Value Date   CREATININE 0.7 03/03/2012   Osteoporosis - read literature on prolia, has decided to restart boniva.  Would like to continue this for next 1/2 to 1 year then reassess.  Epigastric pain started prior to restarting boniva.  Preventative: Here for medicare wellness visit  Last colonoscopy was 2 yrs ago (2011), rec no need to repeat. Father did die of colon cancer. GI - in [place].  Will input records into chart. Mammogram last 03/2011 WNL, schedules herself.  Awaiting call from [facility]. Well woman - done GYN every 2 yrs. Dexa - last 2011.  Tscore -4.5.  Consider repeating after on boniva for several months - and then deciding on prolia vs boniva. Advanced directives: has living will at home.  daughter in law would be one to make EOL decision.  Planning on changing this 2/2 family issues. Tetanus shot - done 2006 Shingles shot - around 2011 Pneumovax - had one after age 20yo. Flu shot - recommended every year.  Hearing screen normal. Vision screen normal (last eye exam was 3 mo ago).  Denies depression/anhedonia, sadness other than associated with abd pain. Did have 1 fall in last year - broke pelvis - tripped over dogs.  H/o osteoporosis.  Wt Readings from Last 3 Encounters:  03/29/12 108 lb 8 oz (49.215 kg)  02/23/12 112 lb 8 oz (51.03 kg)  12/17/11 115 lb (52.164 kg)  weight  down 4 lbs.  Medications and allergies reviewed and updated in chart.  Past histories reviewed and updated if relevant as below. Patient Active Problem List  Diagnosis  . Hyperlipidemia  . ESSENTIAL HYPERTENSION, BENIGN  . MITRAL VALVE DISORDER  . Atrial fibrillation  . CONSTIPATION  . WEIGHT LOSS, ABNORMAL  . OTHER DYSPHAGIA  . NONSPECIFIC ABN FINDING RAD & OTH EXAM GI TRACT  . Fatigue  . Encounter for long-term (current) use of anticoagulants  . TIA (transient ischemic attack)  . Hypothyroidism  . Diabetes type 2, controlled  . Pulmonary nodules  . Anxiety  . GERD (gastroesophageal reflux disease)  . Abdominal pain, RUQ  . Osteoporosis   Past Medical History  Diagnosis Date  . History of TIA (transient ischemic attack) remote  . Hypothyroidism   . History of rheumatic fever   . Osteoporosis     Thoracic compression fracture, on bisphosphonate and cal/vit D  . Atrial fibrillation     Paroxysmal, prefers ASA over coumadin  . Mixed hyperlipidemia   . Essential hypertension, benign   . Diabetes type 2, controlled     diet controlled  . Pulmonary nodules     stable bilateral on CT 01/2010, rec rpt yearly for 2 yrs, decided to stop f/u  . Mitral valve prolapse   . Aortic stenosis   . Anemia   . Arthritis   . History of skin cancer   . Anxiety     with panic attacks  .  Diverticulosis     by colonoscopy  . GERD (gastroesophageal reflux disease)   . Glaucoma   . Seasonal allergies   . History of pelvic fracture April 2013  . COPD (chronic obstructive pulmonary disease)     on CT scan 2011  . Chronic gastritis 10/2009    and duodenitis on EGD   Past Surgical History  Procedure Date  . Tonsillectomy   . Cesarean section     (and h/o 6 miscarriages)  . Breast lumpectomy 1983    LEFT, benign  . Patella fracture surgery 2004    LEFT surgically repaired with pins and wire  . Dexa 05/2010    T score -4.5 spine, -2.4 femur; does not want to repeat  . Cardiovascular  stress test 2010    negative  . Cataract extraction 2012    RIGHT  . Carotid US 2011    mild plaque formation  . Colonoscopy 03/2010    int hemorrhoids, diverticulosis, no need to repeat (Dr. Kendell Bane)  . Esophagogastroduodenoscopy 10/2009    small HH, multiple antric ulcerations s/p biopsy, mild chronic gastritis/duodenitis   History  Substance Use Topics  . Smoking status: Former Smoker -- 1.0 packs/day for 20 years    Types: Cigarettes    Quit date: 10/13/1998  . Smokeless tobacco: Never Used  . Alcohol Use: No   Family History  Problem Relation Age of Onset  . Coronary artery disease Mother 25    MI deceased  . Colon cancer Father 51  . Arrhythmia Sister   . Stroke Paternal Grandmother   . Diabetes Neg Hx    Allergies  Allergen Reactions  . Propoxyphene-Acetaminophen Shortness Of Breath  . Codeine Nausea And Vomiting  . Meperidine Hcl Nausea And Vomiting  . Morphine Nausea And Vomiting  . Shellfish Allergy    Current Outpatient Prescriptions on File Prior to Visit  Medication Sig Dispense Refill  . acetaminophen (TYLENOL) 500 MG tablet Take 500 mg by mouth daily as needed. For pain      . Calcium Carbonate Antacid (TUMS ULTRA 1000 PO) Take by mouth as directed.      . Calcium-Magnesium-Vitamin D 500-250-200 MG-MG-UNIT TABS Take 1 tablet by mouth 2 (two) times daily.      . clonazePAM (KLONOPIN) 0.5 MG tablet Take 1 tablet (0.5 mg total) by mouth at bedtime.  60 tablet  0  . docusate sodium (COLACE) 100 MG capsule Take 100 mg by mouth at bedtime as needed. ONE CAPSULE AS NEEDED, MAY TAKE SEVERAL TIMES PER WEEK.      Marland Kitchen estradiol (ESTRACE) 0.1 MG/GM vaginal cream Place 2 g vaginally as directed.        . felodipine (PLENDIL) 5 MG 24 hr tablet TAKE 1 TABLET BY MOUTH EVERY DAY  90 tablet  0  . latanoprost (XALATAN) 0.005 % ophthalmic solution Place 1 drop into both eyes at bedtime.       Marland Kitchen levothyroxine (SYNTHROID, LEVOTHROID) 50 MCG tablet Take 1 tablet (50 mcg total) by  mouth daily.  90 tablet  3  . Multiple Vitamin (MULTIVITAMIN) capsule Take 1 capsule by mouth every morning.       . Omega-3 Fatty Acids (FISH OIL CONCENTRATE) 1000 MG CAPS Take 2 capsules by mouth 2 (two) times daily.        . polyethylene glycol (MIRALAX / GLYCOLAX) packet Take 17 g by mouth 3 (three) times a week. As needed for constipation      . traMADol (ULTRAM) 50 MG  tablet Take 50 mg by mouth every 6 (six) hours as needed.      . warfarin (COUMADIN) 2.5 MG tablet TAKE AS DIRECTED BY ANTICOAGULATION CLINIC...UP TO 2 TABLETS DAILY  60 tablet  2  . esomeprazole (NEXIUM) 40 MG capsule Take 1 capsule (40 mg total) by mouth daily.  30 capsule  3  . DISCONTD: Calcium Carbonate-Vitamin D (CALCIUM PLUS VITAMIN D PO) Take 2 tablets by mouth daily.          Review of Systems  Constitutional: Positive for unexpected weight change (attributes to trouble with stomach). Negative for fever, chills, activity change, appetite change and fatigue.  HENT: Negative for hearing loss and neck pain.   Eyes: Negative for visual disturbance.  Respiratory: Negative for cough, chest tightness, shortness of breath and wheezing.   Cardiovascular: Positive for chest pain (s/p eval by cards negative for cardiac cause, though GERD related). Negative for palpitations and leg swelling.  Gastrointestinal: Positive for nausea, abdominal pain and diarrhea. Negative for vomiting, constipation, blood in stool and abdominal distention.  Genitourinary: Negative for hematuria and difficulty urinating.  Musculoskeletal: Negative for myalgias and arthralgias.  Skin: Negative for rash.  Neurological: Negative for dizziness, seizures, syncope and headaches.  Hematological: Does not bruise/bleed easily.  Psychiatric/Behavioral: Negative for dysphoric mood. The patient is nervous/anxious (with panic attacks).        Objective:   Physical Exam  Nursing note and vitals reviewed. Constitutional: She is oriented to person, place, and  time. She appears well-developed and well-nourished. No distress.  HENT:  Head: Normocephalic and atraumatic.  Right Ear: Hearing, tympanic membrane, external ear and ear canal normal.  Left Ear: Hearing, tympanic membrane, external ear and ear canal normal.  Nose: Nose normal.  Mouth/Throat: Oropharynx is clear and moist. No oropharyngeal exudate.  Eyes: Conjunctivae normal and EOM are normal. Pupils are equal, round, and reactive to light. No scleral icterus.  Neck: Normal range of motion. Neck supple.  Cardiovascular: Normal rate, regular rhythm, normal heart sounds and intact distal pulses.   No murmur heard. Pulses:      Radial pulses are 2+ on the right side, and 2+ on the left side.  Pulmonary/Chest: Effort normal and breath sounds normal. No respiratory distress. She has no wheezes. She has no rales. Right breast exhibits no inverted nipple, no mass, no nipple discharge, no skin change and no tenderness. Left breast exhibits no inverted nipple, no mass, no nipple discharge, no skin change and no tenderness. Breasts are symmetrical.  Abdominal: Soft. Bowel sounds are normal. She exhibits no distension and no mass. There is tenderness (mild) in the right upper quadrant, left upper quadrant and left lower quadrant. There is no rebound and no guarding.  Musculoskeletal: Normal range of motion. She exhibits no edema.  Lymphadenopathy:    She has no cervical adenopathy.    She has no axillary adenopathy.       Right axillary: No lateral adenopathy present.       Left axillary: No lateral adenopathy present.      Right: No supraclavicular adenopathy present.       Left: No supraclavicular adenopathy present.  Neurological: She is alert and oriented to person, place, and time.       CN grossly intact, station and gait intact  Skin: Skin is warm and dry. No rash noted.  Psychiatric: She has a normal mood and affect. Her behavior is normal. Judgment and thought content normal.  Assessment & Plan:

## 2012-03-29 NOTE — Patient Instructions (Addendum)
Hold oral boniva for now - we will call you about IV form. Change prilosec to nexium 40mg  daily.  Trial of this for 1 month.  If abdominal pain not better, let us know.  Would likely obtain CT scan. Return in 1 month for follow up. Good to see you today, call us with questions.

## 2012-04-03 ENCOUNTER — Encounter: Payer: Self-pay | Admitting: Family Medicine

## 2012-04-03 DIAGNOSIS — Z0000 Encounter for general adult medical examination without abnormal findings: Secondary | ICD-10-CM | POA: Insufficient documentation

## 2012-04-03 NOTE — Assessment & Plan Note (Signed)
I have personally reviewed the Medicare Annual Wellness questionnaire and have noted 1. The patient's medical and social history 2. Their use of alcohol, tobacco or illicit drugs 3. Their current medications and supplements 4. The patient's functional ability including ADL's, fall risks, home safety risks and hearing or visual impairment. 5. Diet and physical activity 6. Evidence for depression or mood disorders The patients weight, height, BMI have been recorded in the chart.  Hearing and vision has been addressed. I have made referrals, counseling and provided education to the patient based review of the above and I have provided the pt with a written personalized care plan for preventive services. See scanned questionairre. Advanced directives discussed:  Pt planning on making change to [facility]  Reviewed preventative protocols and updated unless pt declined. Breast exam today, schedules her own mammograms.

## 2012-04-03 NOTE — Assessment & Plan Note (Addendum)
With continued weight loss. H/o mild chronic gastritis/duodenitis by last EGD 2011. ?if worsened dyspepsia. Recent abd [place] last month reassuring, reviewed. Recommended hold oral bisphosphonate for now, increase GERD therapy from omeprazole to nexium 40mg  daily.  Knows to avoid NSAIDs. If not improving, consider CT abd with contrast to further eval. H/o thickened stomach on prior CT, but EGD reassuring 10/2009.

## 2012-04-03 NOTE — Assessment & Plan Note (Signed)
Likely will not be able to tolerate oral boniva given abd pain and h/o chronic gastritis/duodenitis. Will contact her to schedule IV bisphosphonate. Vit D and calcium adequately replaced.

## 2012-04-03 NOTE — Assessment & Plan Note (Signed)
Continues diet controlled based on recent A1c.

## 2012-04-04 ENCOUNTER — Telehealth: Payer: Self-pay | Admitting: Family Medicine

## 2012-04-04 NOTE — Telephone Encounter (Signed)
Marion, I'd like to set pt up with IV ibandronate (boniva) - 3mg  IV Q5mo. She has recent Cr and vit D level. How can I go about setting this up? I did discuss with pt about this at her last medicare wellness visit.

## 2012-04-05 ENCOUNTER — Other Ambulatory Visit: Payer: Self-pay | Admitting: Family Medicine

## 2012-04-05 NOTE — Telephone Encounter (Signed)
plz call pt to let her know.

## 2012-04-06 ENCOUNTER — Telehealth: Payer: Self-pay | Admitting: *Deleted

## 2012-04-06 NOTE — Telephone Encounter (Signed)
Form received from [employer] to increase Nexium to 90 day supply. Requires signature. In your IN box.

## 2012-04-06 NOTE — Telephone Encounter (Signed)
Patient scheduled for her 1st Boniva infusion on 04/12/2012 at 1pm. Patient will call us to set up the second one when she returns from her trip in March 2014.

## 2012-04-07 ENCOUNTER — Other Ambulatory Visit: Payer: Self-pay | Admitting: *Deleted

## 2012-04-07 MED ORDER — ESOMEPRAZOLE MAGNESIUM 40 MG PO CPDR: 40.0000 mg | DELAYED_RELEASE_CAPSULE | Freq: Every day | ORAL | Status: DC

## 2012-04-07 NOTE — Telephone Encounter (Signed)
Signed and placed in Kim's box. 

## 2012-04-07 NOTE — Telephone Encounter (Signed)
Form faxed as directed

## 2012-04-11 ENCOUNTER — Encounter: Payer: Self-pay | Admitting: Family Medicine

## 2012-04-11 ENCOUNTER — Other Ambulatory Visit (HOSPITAL_COMMUNITY): Payer: Self-pay | Admitting: *Deleted

## 2012-04-12 ENCOUNTER — Encounter (HOSPITAL_COMMUNITY)
Admission: RE | Admit: 2012-04-12 | Discharge: 2012-04-12 | Disposition: A | Discharge status: Home / Self Care | Payer: Medicare Other | Source: Ambulatory Visit | Attending: Family Medicine | Admitting: Family Medicine

## 2012-04-12 DIAGNOSIS — M810 Age-related osteoporosis without current pathological fracture: Secondary | ICD-10-CM | POA: Diagnosis not present

## 2012-04-12 MED ORDER — IBANDRONATE SODIUM 3 MG/3ML IV SOLN
3.0000 mg | Freq: Once | INTRAVENOUS | Status: AC
  Administered 2012-04-12: 3 mg via INTRAVENOUS
  Filled 2012-04-12: qty 3

## 2012-04-12 MED ORDER — SODIUM CHLORIDE 0.9 % IV SOLN
Freq: Once | INTRAVENOUS | Status: AC
  Administered 2012-04-12: 250 mL via INTRAVENOUS

## 2012-04-13 ENCOUNTER — Other Ambulatory Visit: Payer: Self-pay | Admitting: Family Medicine

## 2012-04-13 ENCOUNTER — Telehealth: Payer: Self-pay

## 2012-04-13 DIAGNOSIS — Z139 Encounter for screening, unspecified: Secondary | ICD-10-CM

## 2012-04-13 NOTE — Telephone Encounter (Signed)
Pain under ribs and belly is better but still discomfort on and off. Pt goes out of town for 3 months soon and would like to be seen before appt 05/03/12. Pt also found info about previous CT of chest and abdomen; test doen at [facility] 10/10/09.Please advise.

## 2012-04-14 NOTE — Telephone Encounter (Signed)
Message left advising patient that you had report and it was fine to reschedule appt. Advised to call and reschedule when convenient.

## 2012-04-14 NOTE — Telephone Encounter (Signed)
plz notify I do have report in chart of CT abd/pelvis done 09/2009.   Fine to make appt sooner than 05/03/2012 to discuss abd issues.

## 2012-04-18 ENCOUNTER — Ambulatory Visit (INDEPENDENT_AMBULATORY_CARE_PROVIDER_SITE_OTHER): Payer: Medicare Other | Admitting: *Deleted

## 2012-04-18 DIAGNOSIS — Z7901 Long term (current) use of anticoagulants: Secondary | ICD-10-CM | POA: Diagnosis not present

## 2012-04-18 DIAGNOSIS — G459 Transient cerebral ischemic attack, unspecified: Secondary | ICD-10-CM

## 2012-04-18 DIAGNOSIS — I4891 Unspecified atrial fibrillation: Secondary | ICD-10-CM | POA: Diagnosis not present

## 2012-04-20 ENCOUNTER — Encounter: Payer: Self-pay | Admitting: Family Medicine

## 2012-04-20 ENCOUNTER — Ambulatory Visit (INDEPENDENT_AMBULATORY_CARE_PROVIDER_SITE_OTHER): Payer: Medicare Other | Admitting: Family Medicine

## 2012-04-20 ENCOUNTER — Other Ambulatory Visit: Payer: Self-pay | Admitting: *Deleted

## 2012-04-20 VITALS — BP 118/62 | HR 80 | Temp 97.6°F | Wt 109.2 lb

## 2012-04-20 DIAGNOSIS — R1013 Epigastric pain: Secondary | ICD-10-CM

## 2012-04-20 DIAGNOSIS — Z23 Encounter for immunization: Secondary | ICD-10-CM

## 2012-04-20 LAB — H. PYLORI ANTIBODY, IGG: H Pylori IgG: NEGATIVE

## 2012-04-20 MED ORDER — SUCRALFATE 1 GM/10ML PO SUSP: 1.0000 g | Freq: Four times a day (QID) | ORAL | Status: DC

## 2012-04-20 MED ORDER — TRAMADOL HCL 50 MG PO TABS
50.0000 mg | ORAL_TABLET | Freq: Four times a day (QID) | ORAL | Status: DC | PRN
Start: 2012-04-20 — End: 2012-06-10

## 2012-04-20 MED ORDER — TRAMADOL HCL 50 MG PO TABS: 50.0000 mg | ORAL_TABLET | Freq: Four times a day (QID) | ORAL | Status: DC | PRN

## 2012-04-20 NOTE — Progress Notes (Signed)
  Subjective:    Patient ID: [name], female    DOB: 08/18/1925, 78 y.o.   MRN: [id number]  HPI CC: epigastric abd pain  Presents for f/u of epigastric abd pain.  Last visit thought due to worsening dyspepsia vs gastritis.  Omeprazole 20mg  bid was changed to nexium 40mg  daily.  Has been on nexium for 2 weeks now.  Stays nauseated some.  Also stays constipated.  Overall improved but still having daily sxs.  Having to take tramadol practically every day 2/2 epigastric pain.  Tramadol helps pain.  No vomiting, early satiety, dysphagia.  No weight loss.  Bisphosphonate was also held, changed to IV bisphosphonate, done last week.  Tolerated well. On coumadin.  Avoids NSAIDs, not on ASA. Requests refill of tramadol.  H/o thickened stomach wall on abd CT 09/2009, but EGD reassuring 10/2009 (Rourk): EGD showing chronic gastritis and duodenitis with biopsied ulcers, small HH.  Biopsy negative for H pylori. Colonoscopy showing int hemorrhoids and left sided transverse diverticulosis w/o polyps.  rec f/u prn.  Constipation - knows tramadol may worsen constipation.  Does drink plenty of water daily. Good amount of fiber in diet.  Wt Readings from Last 3 Encounters:  04/20/12 109 lb 4 oz (49.555 kg)  04/12/12 108 lb (48.988 kg)  03/29/12 108 lb 8 oz (49.215 kg)    Past Medical History  Diagnosis Date  . History of TIA (transient ischemic attack) remote  . Hypothyroidism   . History of rheumatic fever   . Osteoporosis     Thoracic compression fracture, on bisphosphonate and cal/vit D  . Atrial fibrillation     Paroxysmal, prefers ASA over coumadin  . Mixed hyperlipidemia   . Essential hypertension, benign   . Diabetes type 2, controlled     diet controlled  . Pulmonary nodules     stable bilateral on CT 01/2010, rec rpt yearly for 2 yrs, pt decided to stop f/u  . Mitral valve prolapse   . Aortic stenosis   . Anemia   . Arthritis   . History of skin cancer   . Anxiety     with  panic attacks  . Diverticulosis     by colonoscopy  . GERD (gastroesophageal reflux disease)   . Glaucoma   . Seasonal allergies   . History of pelvic fracture April 2013  . COPD (chronic obstructive pulmonary disease)     on CT scan 2011  . Chronic gastritis 10/2009    and duodenitis on EGD    Review of Systems Per HPI    Objective:   Physical Exam  Nursing note and vitals reviewed. Constitutional: She appears well-developed and well-nourished. No distress.  HENT:  Head: Normocephalic and atraumatic.  Mouth/Throat: Oropharynx is clear and moist. No oropharyngeal exudate.  Cardiovascular: Normal rate, regular rhythm, normal heart sounds and intact distal pulses.   No murmur heard. Pulmonary/Chest: Effort normal and breath sounds normal. No respiratory distress. She has no wheezes. She has no rales.  Abdominal: Soft. She exhibits no distension and no mass. Bowel sounds are increased. There is no hepatosplenomegaly. There is tenderness (mild) in the epigastric area, suprapubic area and left lower quadrant. There is no rebound, no guarding and no CVA tenderness.  Skin: Skin is warm and dry. No rash noted.  Psychiatric: She has a normal mood and affect.       Assessment & Plan:

## 2012-04-20 NOTE — Assessment & Plan Note (Signed)
Anticipate either gastritis or PUD in h/o known chronic gastritis/duodenitis and ulcers by prior EGD.  Is improving on nexium daily - continue this.  Add carafate. No red flags. Will recommend she continue nexium for total of 4 weeks and if not better, schedule f/u appt with GI. Significant international travel yearly.  Check H pylori IgG.  Pt agrees with plan.

## 2012-04-20 NOTE — Patient Instructions (Addendum)
I do think this is continued gastritis. Test for H pylori provided today. Continue nexium once daily for next 2 weeks.  Avoid acidic foods like tomatoes, citrus. Try carafate with meals - watch for worsening constipation - if that happens let me know. If not better, keep appointment with Dr. Jena Gauss (pass by [place]'s office to schedule this appt).  Gastritis, Adult Gastritis is soreness and swelling (inflammation) of the lining of the stomach. Gastritis can develop as a sudden onset (acute) or long-term (chronic) condition. If gastritis is not treated, it can lead to stomach bleeding and ulcers. CAUSES  Gastritis occurs when the stomach lining is weak or damaged. Digestive juices from the stomach then inflame the weakened stomach lining. The stomach lining may be weak or damaged due to viral or bacterial infections. One common bacterial infection is the Helicobacter pylori infection. Gastritis can also result from excessive alcohol consumption, taking certain medicines, or having too much acid in the stomach.  SYMPTOMS  In some cases, there are no symptoms. When symptoms are present, they may include:  Pain or a burning sensation in the upper abdomen.  Nausea.  Vomiting.  An uncomfortable feeling of fullness after eating. DIAGNOSIS  Your caregiver may suspect you have gastritis based on your symptoms and a physical exam. To determine the cause of your gastritis, your caregiver may perform the following:  Blood or stool tests to check for the H pylori bacterium.  Gastroscopy. A thin, flexible tube (endoscope) is passed down the esophagus and into the stomach. The endoscope has a light and camera on the end. Your caregiver uses the endoscope to view the inside of the stomach.  Taking a tissue sample (biopsy) from the stomach to examine under a microscope. TREATMENT  Depending on the cause of your gastritis, medicines may be prescribed. If you have a bacterial infection, such as an H pylori  infection, antibiotics may be given. If your gastritis is caused by too much acid in the stomach, H2 blockers or antacids may be given. Your caregiver may recommend that you stop taking aspirin, ibuprofen, or other nonsteroidal anti-inflammatory drugs (NSAIDs). HOME CARE INSTRUCTIONS  Only take over-the-counter or prescription medicines as directed by your caregiver.  If you were given antibiotic medicines, take them as directed. Finish them even if you start to feel better.  Drink enough fluids to keep your urine clear or pale yellow.  Avoid foods and drinks that make your symptoms worse, such as:  Caffeine or alcoholic drinks.  Chocolate.  Peppermint or mint flavorings.  Garlic and onions.  Spicy foods.  Citrus fruits, such as oranges, lemons, or limes.  Tomato-based foods such as sauce, chili, salsa, and pizza.  Fried and fatty foods.  Eat small, frequent meals instead of large meals. SEEK IMMEDIATE MEDICAL CARE IF:   You have black or dark red stools.  You vomit blood or material that looks like coffee grounds.  You are unable to keep fluids down.  Your abdominal pain gets worse.  You have a fever.  You do not feel better after 1 week.  You have any other questions or concerns. MAKE SURE YOU:  Understand these instructions.  Will watch your condition.  Will get help right away if you are not doing well or get worse. Document Released: 06/30/2001 Document Revised: 01/05/2012 Document Reviewed: 08/19/2011 [facility] Patient Information 2013 [place], [place].

## 2012-04-21 DIAGNOSIS — L820 Inflamed seborrheic keratosis: Secondary | ICD-10-CM | POA: Diagnosis not present

## 2012-04-21 DIAGNOSIS — D235 Other benign neoplasm of skin of trunk: Secondary | ICD-10-CM | POA: Diagnosis not present

## 2012-04-21 DIAGNOSIS — L821 Other seborrheic keratosis: Secondary | ICD-10-CM | POA: Diagnosis not present

## 2012-04-21 DIAGNOSIS — L570 Actinic keratosis: Secondary | ICD-10-CM | POA: Diagnosis not present

## 2012-04-28 ENCOUNTER — Other Ambulatory Visit: Payer: Self-pay | Admitting: Family Medicine

## 2012-04-28 NOTE — Telephone Encounter (Signed)
plz phone in. 

## 2012-04-29 NOTE — Telephone Encounter (Signed)
Rx called in as directed.   

## 2012-05-03 ENCOUNTER — Ambulatory Visit: Payer: Medicare Other | Admitting: Family Medicine

## 2012-05-03 ENCOUNTER — Telehealth: Payer: Self-pay

## 2012-05-03 NOTE — Telephone Encounter (Signed)
Pt has taken Nexium for 4 weeks; pt thinks she is better but still has upper and lower abdominal cramping with nausea at night and early morning.  Tramadol helps relieve pain and nausea. Should pt continue Nexium and carafate or not? Pt has scheduled appt with GI 05/20/12. [facility].Please advise.

## 2012-05-04 MED ORDER — SUCRALFATE 1 GM/10ML PO SUSP: 1.0000 g | Freq: Four times a day (QID) | ORAL | Status: DC

## 2012-05-04 NOTE — Telephone Encounter (Signed)
Patient notified

## 2012-05-04 NOTE — Telephone Encounter (Signed)
Lets continue nexium and carafate until seen by GI.  carafate refilled.  Should have enough nexium.

## 2012-05-09 ENCOUNTER — Ambulatory Visit (INDEPENDENT_AMBULATORY_CARE_PROVIDER_SITE_OTHER): Payer: Medicare Other | Admitting: *Deleted

## 2012-05-09 DIAGNOSIS — I4891 Unspecified atrial fibrillation: Secondary | ICD-10-CM

## 2012-05-09 DIAGNOSIS — Z7901 Long term (current) use of anticoagulants: Secondary | ICD-10-CM

## 2012-05-09 DIAGNOSIS — G459 Transient cerebral ischemic attack, unspecified: Secondary | ICD-10-CM | POA: Diagnosis not present

## 2012-05-09 LAB — POCT INR: INR: 4.7

## 2012-05-20 ENCOUNTER — Ambulatory Visit (INDEPENDENT_AMBULATORY_CARE_PROVIDER_SITE_OTHER): Payer: Medicare Other | Admitting: Internal Medicine

## 2012-05-20 ENCOUNTER — Telehealth: Payer: Self-pay

## 2012-05-20 ENCOUNTER — Other Ambulatory Visit: Payer: Self-pay | Admitting: Internal Medicine

## 2012-05-20 ENCOUNTER — Encounter: Payer: Self-pay | Admitting: Internal Medicine

## 2012-05-20 ENCOUNTER — Telehealth: Payer: Self-pay | Admitting: Cardiology

## 2012-05-20 VITALS — BP 125/76 | HR 73 | Temp 98.1°F | Ht 66.5 in | Wt 111.4 lb

## 2012-05-20 DIAGNOSIS — R109 Unspecified abdominal pain: Secondary | ICD-10-CM

## 2012-05-20 DIAGNOSIS — R634 Abnormal weight loss: Secondary | ICD-10-CM

## 2012-05-20 NOTE — Telephone Encounter (Signed)
Pt.notified

## 2012-05-20 NOTE — Telephone Encounter (Signed)
States that Dr.Rourk is wanting to do Colonoscopy on Monday but she needs to be off Coumadin 3 days prior.  Wants to know if this is okay. / tg

## 2012-05-20 NOTE — Progress Notes (Signed)
Primary Care Physician:  Eustaquio Boyden, MD Primary Gastroenterologist:  Dr. Jena Gauss  Pre-Procedure History & Physical: HPI:  [name] is a 78 y.o. female here for further evaluation of abdominal pain and weight loss. Patient relates some degree of epigastric/periumbilical abdominal pain going back some 2-3 years. She was seen by [place] couple of years ago for a thickened stomach on CT. EGD revealed small benign-appearing gastric ulcers. Biopsies negative for malignancy or H. pylori also biopsy the small bowel negative for celiac disease (she had been using ibuprofen at that time). More recently H. pylori serologies came back negative. Patient tells me that since May of this year after falling and fracturing her pelvis she developed prominent symptoms of. Umbilical epigastric pain after eating to the point of food avoidance. She states every time she ate she got abdominal pain pain sometimes lasted into the evening sometimes subsided after a large meal. I note from the medical records she has lost weight steadily since March of this year losing a total of 9 pounds through today's office visit. She denies dysphagia, odynophagia, nausea or vomiting. Really doesn't have any early satiety, per say.   Moves her bowels regularly. Denies melena or hematochezia. Denies constipation or diarrhea. Has taken parenteral bisphosphonate therapy. No oral agents. No nonsteroidal agents. She has been on esomeprazole for several weeks without much improvement in her symptoms although she states she perceives may have improved somewhat over the past 2-3 months. Recent gallbladder ultrasound demonstrated no biliary abnormalities. CT of abdomen 2 years ago did demonstrate some atherosclerotic vascular changes but this was not a dedicated vascular study. Again, the gastric wall was somewhat thickened on that examination. She is chronically anticoagulated due to atrial fibrillation.  Recent blood work demonstrates normal LFTs and  no anemia.    Past Medical History  Diagnosis Date  . History of TIA (transient ischemic attack) remote  . Hypothyroidism   . History of rheumatic fever   . Osteoporosis     Thoracic compression fracture, on bisphosphonate and cal/vit D  . Atrial fibrillation     Paroxysmal, prefers ASA over coumadin  . Mixed hyperlipidemia   . Essential hypertension, benign   . Diabetes type 2, controlled     diet controlled  . Pulmonary nodules     stable bilateral on CT 01/2010, rec rpt yearly for 2 yrs, pt decided to stop f/u  . Mitral valve prolapse   . Aortic stenosis   . Anemia   . Arthritis   . History of skin cancer   . Anxiety     with panic attacks  . Diverticulosis     by colonoscopy  . GERD (gastroesophageal reflux disease)   . Glaucoma   . Seasonal allergies   . History of pelvic fracture April 2013  . COPD (chronic obstructive pulmonary disease)     on CT scan 2011  . Chronic gastritis 10/2009    and duodenitis on EGD  . Hiatal hernia 2011    small    Past Surgical History  Procedure Date  . Tonsillectomy   . Cesarean section     (and h/o 6 miscarriages)  . Breast lumpectomy 1983    LEFT, benign  . Patella fracture surgery 05/2006    LEFT surgically repaired with pins and wire  . Dexa 05/2010    T score -4.5 spine, -2.4 femur; does not want to repeat  . Cardiovascular stress test 2010    negative  . Cataract extraction 2012  RIGHT  . Carotid US 2011    mild plaque formation  . Colonoscopy 03/2010    int hemorrhoids, diverticulosis, no need to repeat (Dr. Kendell Bane)  . Esophagogastroduodenoscopy 10/2009    small HH, multiple antric ulcerations s/p biopsy, mild chronic gastritis/duodenitis    Prior to Admission medications   Medication Sig Start Date End Date Taking? Authorizing Provider  acetaminophen (TYLENOL) 500 MG tablet Take 500 mg by mouth daily as needed. For pain   Yes Historical Provider, MD  Calcium Carbonate Antacid (TUMS ULTRA 1000 PO) Take by  mouth as directed.   Yes Historical Provider, MD  Calcium-Magnesium-Vitamin D 500-250-200 MG-MG-UNIT TABS Take 1 tablet by mouth 2 (two) times daily.   Yes Historical Provider, MD  clonazePAM (KLONOPIN) 0.5 MG tablet TAKE 1 TABLET BY MOUTH AT BEDTIME 04/28/12  Yes Eustaquio Boyden, MD  docusate sodium (COLACE) 100 MG capsule Take 100 mg by mouth at bedtime as needed. ONE CAPSULE AS NEEDED, MAY TAKE SEVERAL TIMES PER WEEK.   Yes Historical Provider, MD  esomeprazole (NEXIUM) 40 MG capsule Take 1 capsule (40 mg total) by mouth daily. 04/07/12 04/07/13 Yes Eustaquio Boyden, MD  estradiol (ESTRACE) 0.1 MG/GM vaginal cream Place 2 g vaginally as directed.     Yes Historical Provider, MD  felodipine (PLENDIL) 5 MG 24 hr tablet TAKE 1 TABLET BY MOUTH EVERY DAY 08/20/11  Yes Wendall Stade, MD  ibandronate (BONIVA) 3 MG/3ML SOLN injection Inject 3 mLs (3 mg total) into the vein once. 04/05/12  Yes Eustaquio Boyden, MD  latanoprost (XALATAN) 0.005 % ophthalmic solution Place 1 drop into both eyes at bedtime.    Yes Historical Provider, MD  levothyroxine (SYNTHROID, LEVOTHROID) 50 MCG tablet Take 1 tablet (50 mcg total) by mouth daily. 03/29/12  Yes Eustaquio Boyden, MD  Multiple Vitamin (MULTIVITAMIN) capsule Take 1 capsule by mouth every morning.    Yes Historical Provider, MD  Omega-3 Fatty Acids (FISH OIL CONCENTRATE) 1000 MG CAPS Take 2 capsules by mouth 2 (two) times daily.     Yes Historical Provider, MD  polyethylene glycol (MIRALAX / GLYCOLAX) packet Take 17 g by mouth 3 (three) times a week. As needed for constipation   Yes Historical Provider, MD  sucralfate (CARAFATE) 1 GM/10ML suspension Take 10 mLs (1 g total) by mouth 4 (four) times daily. 05/04/12  Yes Eustaquio Boyden, MD  traMADol (ULTRAM) 50 MG tablet Take 1 tablet (50 mg total) by mouth every 6 (six) hours as needed. 04/20/12  Yes Eustaquio Boyden, MD  warfarin (COUMADIN) 2.5 MG tablet TAKE AS DIRECTED BY ANTICOAGULATION CLINIC...UP TO 2 TABLETS  DAILY 03/21/12  Yes [name], MD    Allergies as of 05/20/2012 - Review Complete 05/20/2012  Allergen Reaction Noted  . Propoxyphene-acetaminophen Shortness Of Breath 01/09/2009  . Codeine Nausea And Vomiting   . Meperidine hcl Nausea And Vomiting 01/09/2009  . Morphine Nausea And Vomiting 01/09/2009  . Shellfish allergy  03/29/2012    Family History  Problem Relation Age of Onset  . Coronary artery disease Mother 70    MI deceased  . Colon cancer Father 49  . Arrhythmia Sister   . Stroke Paternal Grandmother   . Diabetes Neg Hx     History   Social History  . Marital Status: Married    Spouse Name: N/A    Number of Children: N/A  . Years of Education: N/A   Occupational History  . Retired     Former Charity fundraiser   Social History  Main Topics  . Smoking status: Former Smoker -- 1.0 packs/day for 20 years    Types: Cigarettes    Quit date: 10/13/1998  . Smokeless tobacco: Never Used  . Alcohol Use: No  . Drug Use: No  . Sexually Active: No   Other Topics Concern  . Not on file   Social History Narrative   Caffeine: rareLives with husband, 5 dogsOccupation: retired, RNActivity: Diet:     Review of Systems: See HPI, otherwise negative ROS  Physical Exam: BP 125/76  Pulse 73  Temp 98.1 F (36.7 C) (Temporal)  Ht 5' 6.5" (1.689 m)  Wt 111 lb 6.4 oz (50.531 kg)  BMI 17.71 kg/m2 General:   Alert, frail, thin pleasant and cooperative in NAD. She is well oriented and conversant. Skin:  Intact without significant lesions or rashes. Eyes:  Sclera clear, no icterus.   Conjunctiva pink. Ears:  Normal auditory acuity. Nose:  No deformity, discharge,  or lesions. Mouth:  No deformity or lesions. Neck:  Supple; no masses or thyromegaly. No significant cervical adenopathy. Lungs:  Clear throughout to auscultation.   No wheezes, crackles, or rhonchi. No acute distress. Heart:  Regular rate and rhythm; no murmurs, clicks, rubs,  or gallops. Abdomen: Non-distended,  normal bowel sounds. No bruits. Soft and nontender without appreciable mass or hepatosplenomegaly, although aorta is quite pulsatile.  Pulses:  Normal pulses noted. Extremities:  Without clubbing or edema. Rectal: Good sphincter tone no mass the rectal vault scant brown stool is Hemoccult negative   Impression/Plan:  Very pleasant, intelligent 78 year old lady with waxing and waning abdominal pain over the past few years -  worse earlier this year.  However, over the past few months, she perceives it to be somewhat improved. I get a sense that she has developed a fear of eating a regular sized meal as it is likely to produce her abdominal pain. She has lost 9 pounds steadily since March of this year. Gallbladder ultrasound negative. Thickening of the gastric wall with small gastric ulcers (more likely NSAID-related) on prior EGD. Biopsies negative for malignancy or H. pylori infection.  Although she could have biliary dyskinesia to account for her waxing and waning postprandial abdominal discomfort, we need to remain vigilant that it is possible she could have an undiagnosed indolent infiltrating process in her stomach not discernible at prior EGD or on histology. In addition, the clinical scenario is also suspicious for intestinal angina or mesenteric ischemia.  Recommendations: Proceed with an EGD expeditiously to reevaluate her luminal upper GI tract. If this study is unrevealing, then we'll proceed on with a CT angiogram to evaluate her further for critical mesenteric stenosis, etc. I will plan to hold her Coumadin for 4 days prior to the procedure. I will also check with Dr. Diona Browner to see if bridging is necessary. Further recommendations to follow. I'd like to thank Dr. Sharen Hones for allowing me to see this nice lady once again.

## 2012-05-20 NOTE — Telephone Encounter (Signed)
Ok to hold coumadin 3-4 days prior to procedure per Dr Diona Browner.  Does not need bridging.

## 2012-05-20 NOTE — Telephone Encounter (Signed)
Please advise 

## 2012-05-20 NOTE — Telephone Encounter (Signed)
Yes, can hold Coumadin as requested. Does not need bridging.

## 2012-05-20 NOTE — Telephone Encounter (Signed)
Pt needs to have an egd with Dr. Jena Gauss soon. He wants to hold her coumadin for 4 days. Is it ok to hold or does she need to be bridged? Please advise. Thanks.

## 2012-05-23 ENCOUNTER — Telehealth: Payer: Self-pay | Admitting: *Deleted

## 2012-05-23 ENCOUNTER — Other Ambulatory Visit: Payer: Self-pay | Admitting: Internal Medicine

## 2012-05-23 DIAGNOSIS — R109 Unspecified abdominal pain: Secondary | ICD-10-CM

## 2012-05-23 NOTE — Telephone Encounter (Signed)
Per notes by Vashti Hey RN, pt is aware to hold coumadin. LMOM to confirm with pt.

## 2012-05-23 NOTE — Telephone Encounter (Signed)
Pt called.  Colonoscopy is scheduled for 11/7.  She will hold coumadin till then.  After procedure she will take coumadin 5mg  x 5 days then resume regular schedule of 5mg  qd except 2.5mg  on M,W,F.  INR check on 11/14.

## 2012-05-24 ENCOUNTER — Ambulatory Visit (HOSPITAL_COMMUNITY)
Admission: RE | Admit: 2012-05-24 | Discharge: 2012-05-24 | Disposition: A | Discharge status: Home / Self Care | Payer: Medicare Other | Source: Ambulatory Visit | Attending: Family Medicine | Admitting: Family Medicine

## 2012-05-24 ENCOUNTER — Encounter (HOSPITAL_COMMUNITY): Payer: Self-pay | Admitting: Pharmacy Technician

## 2012-05-24 DIAGNOSIS — Z139 Encounter for screening, unspecified: Secondary | ICD-10-CM

## 2012-05-24 DIAGNOSIS — Z1231 Encounter for screening mammogram for malignant neoplasm of breast: Secondary | ICD-10-CM | POA: Insufficient documentation

## 2012-05-25 ENCOUNTER — Encounter: Payer: Self-pay | Admitting: *Deleted

## 2012-05-26 ENCOUNTER — Encounter (HOSPITAL_COMMUNITY)
Admission: RE | Disposition: A | Discharge status: Home / Self Care | Payer: Self-pay | Source: Ambulatory Visit | Attending: Internal Medicine

## 2012-05-26 ENCOUNTER — Telehealth: Payer: Self-pay | Admitting: Internal Medicine

## 2012-05-26 ENCOUNTER — Other Ambulatory Visit: Payer: Self-pay

## 2012-05-26 ENCOUNTER — Encounter (HOSPITAL_COMMUNITY): Payer: Self-pay | Admitting: *Deleted

## 2012-05-26 ENCOUNTER — Ambulatory Visit (HOSPITAL_COMMUNITY)
Admission: RE | Admit: 2012-05-26 | Discharge: 2012-05-26 | Disposition: A | Discharge status: Home / Self Care | Payer: Medicare Other | Source: Ambulatory Visit | Attending: Internal Medicine | Admitting: Internal Medicine

## 2012-05-26 ENCOUNTER — Other Ambulatory Visit: Payer: Self-pay | Admitting: Internal Medicine

## 2012-05-26 DIAGNOSIS — Z139 Encounter for screening, unspecified: Secondary | ICD-10-CM

## 2012-05-26 DIAGNOSIS — K449 Diaphragmatic hernia without obstruction or gangrene: Secondary | ICD-10-CM | POA: Insufficient documentation

## 2012-05-26 DIAGNOSIS — R109 Unspecified abdominal pain: Secondary | ICD-10-CM | POA: Diagnosis not present

## 2012-05-26 DIAGNOSIS — E119 Type 2 diabetes mellitus without complications: Secondary | ICD-10-CM | POA: Diagnosis not present

## 2012-05-26 DIAGNOSIS — K559 Vascular disorder of intestine, unspecified: Secondary | ICD-10-CM

## 2012-05-26 DIAGNOSIS — I10 Essential (primary) hypertension: Secondary | ICD-10-CM | POA: Insufficient documentation

## 2012-05-26 DIAGNOSIS — R634 Abnormal weight loss: Secondary | ICD-10-CM | POA: Diagnosis not present

## 2012-05-26 HISTORY — PX: ESOPHAGOGASTRODUODENOSCOPY: SHX5428

## 2012-05-26 SURGERY — EGD (ESOPHAGOGASTRODUODENOSCOPY)
Anesthesia: Moderate Sedation

## 2012-05-26 MED ORDER — MIDAZOLAM HCL 5 MG/5ML IJ SOLN
INTRAMUSCULAR | Status: DC | PRN
  Administered 2012-05-26: 1 mg via INTRAVENOUS
  Administered 2012-05-26: 2 mg via INTRAVENOUS

## 2012-05-26 MED ORDER — SODIUM CHLORIDE 0.45 % IV SOLN
INTRAVENOUS | Status: DC
  Administered 2012-05-26: 09:00:00 via INTRAVENOUS

## 2012-05-26 MED ORDER — FENTANYL CITRATE 0.05 MG/ML IJ SOLN
INTRAMUSCULAR | Status: AC
  Filled 2012-05-26: qty 4

## 2012-05-26 MED ORDER — FENTANYL CITRATE 0.05 MG/ML IJ SOLN
INTRAMUSCULAR | Status: DC | PRN
  Administered 2012-05-26: 25 ug via INTRAVENOUS
  Administered 2012-05-26: 50 ug via INTRAVENOUS

## 2012-05-26 MED ORDER — STERILE WATER FOR IRRIGATION IR SOLN
Status: DC | PRN
  Administered 2012-05-26: 10:00:00

## 2012-05-26 MED ORDER — BUTAMBEN-TETRACAINE-BENZOCAINE 2-2-14 % EX AERO
INHALATION_SPRAY | CUTANEOUS | Status: DC | PRN
  Administered 2012-05-26: 2 via TOPICAL

## 2012-05-26 MED ORDER — MEPERIDINE HCL 100 MG/ML IJ SOLN
INTRAMUSCULAR | Status: AC
  Filled 2012-05-26: qty 2

## 2012-05-26 MED ORDER — MIDAZOLAM HCL 5 MG/5ML IJ SOLN
INTRAMUSCULAR | Status: AC
  Filled 2012-05-26: qty 10

## 2012-05-26 NOTE — Op Note (Addendum)
[facility] [address] [place], [address]   ENDOSCOPY PROCEDURE REPORT  PATIENT: Valerie, [name]  MR#: [id number] BIRTHDATE: [birth year] , 86  yrs. old GENDER: Female ENDOSCOPIST: R.  Roetta Sessions, MD FACP FACG REFERRED BY:  Eustaquio Boyden, M.D.  Nona Dell, M.D. PROCEDURE DATE:  05/26/2012 PROCEDURE:     Diagnostic EGD  INDICATIONS:     Postprandial abdominal pain and weight loss  INFORMED CONSENT:   The risks, benefits, limitations, alternatives and imponderables have been discussed.  The potential for biopsy, esophogeal dilation, etc. have also been reviewed.  Questions have been answered.  All parties agreeable.  Please see the history and physical in the medical record for more information.  MEDICATIONS:  Versed 3 mg IV and Fentanyl 75 micrograms IV in divided doses.  DESCRIPTION OF PROCEDURE:   The EG-[id number] ([id number])  endoscope was introduced through the mouth and advanced to the second portion of the duodenum without difficulty or limitations.  The mucosal surfaces were surveyed very carefully during advancement of the scope and upon withdrawal.  Retroflexion view of the proximal stomach and esophagogastric junction was performed.      FINDINGS:  Normal esophagus. Stomach empty. Small hiatal hernia; otherwise, normal gastric mucosa. Patent pylorus. Normal first and second portion of the duodenum.  THERAPEUTIC / DIAGNOSTIC MANEUVERS PERFORMED:  None   COMPLICATIONS:  None  IMPRESSION:  Hiatal hernia; otherwise normal EGD  RECOMMENDATIONS:  Resume Coumadin today. Proceed with CT angiogram of the abdomen and pelvis to evaluate further for mesenteric ischemia.    _______________________________ R. Roetta Sessions, MD FACP [facility] eSigned:  R. Roetta Sessions, MD FACP [facility] 05/26/2012 11:04 AM Revised: 05/26/2012 11:04 AM    CC:  PATIENT NAME:  Valerie, [name] MR#: [id number]

## 2012-05-26 NOTE — Telephone Encounter (Signed)
Patient called to let us know she had her procedure this morning and was told that she will need a CT scan. She is calling us to set that up. Please call her at [phone]

## 2012-05-26 NOTE — Telephone Encounter (Signed)
Patient is scheduled for CT Angio abd/pel on Thurs Nov 14th at 9:30 and a creatinine will need to be drawn and orders have been faxed to the lab and patient is aware

## 2012-05-26 NOTE — Interval H&P Note (Signed)
History and Physical Interval Note:  05/26/2012 10:09 AM  [name]  has presented today for surgery, with the diagnosis of ABDOMINAL PAIN  The various methods of treatment have been discussed with the patient and family. After consideration of risks, benefits and other options for treatment, the patient has consented to  Procedure(s) (LRB) with comments: ESOPHAGOGASTRODUODENOSCOPY (EGD) (N/A) - 3:00-changed to 2:20 [name] to notify pt as a surgical intervention .  The patient's history has been reviewed, patient examined, no change in status, stable for surgery.  I have reviewed the patient's chart and labs.  Questions were answered to the patient's satisfaction.     [name]  Plan per day of office visit.The risks, benefits, limitations, alternatives and imponderables have been reviewed with the patient. Potential for esophageal dilation, biopsy, etc. have also been reviewed.  Questions have been answered. All parties agreeable.

## 2012-05-26 NOTE — H&P (View-Only) (Signed)
Primary Care Physician:  Javier Gutierrez, MD Primary Gastroenterologist:  Dr. Rourk  Pre-Procedure History & Physical: HPI:  [name] is a 78 y.o. female here for further evaluation of abdominal pain and weight loss. Patient relates some degree of epigastric/periumbilical abdominal pain going back some 2-3 years. She was seen by us couple of years ago for a thickened stomach on CT. EGD revealed small benign-appearing gastric ulcers. Biopsies negative for malignancy or H. pylori also biopsy the small bowel negative for celiac disease (she had been using ibuprofen at that time). More recently H. pylori serologies came back negative. Patient tells me that since May of this year after falling and fracturing her pelvis she developed prominent symptoms of. Umbilical epigastric pain after eating to the point of food avoidance. She states every time she ate she got abdominal pain pain sometimes lasted into the evening sometimes subsided after a large meal. I note from the medical records she has lost weight steadily since March of this year losing a total of 9 pounds through today's office visit. She denies dysphagia, odynophagia, nausea or vomiting. Really doesn't have any early satiety, per say.   Moves her bowels regularly. Denies melena or hematochezia. Denies constipation or diarrhea. Has taken parenteral bisphosphonate therapy. No oral agents. No nonsteroidal agents. She has been on esomeprazole for several weeks without much improvement in her symptoms although she states she perceives may have improved somewhat over the past 2-3 months. Recent gallbladder ultrasound demonstrated no biliary abnormalities. CT of abdomen 2 years ago did demonstrate some atherosclerotic vascular changes but this was not a dedicated vascular study. Again, the gastric wall was somewhat thickened on that examination. She is chronically anticoagulated due to atrial fibrillation.  Recent blood work demonstrates normal LFTs and  no anemia.    Past Medical History  Diagnosis Date  . History of TIA (transient ischemic attack) remote  . Hypothyroidism   . History of rheumatic fever   . Osteoporosis     Thoracic compression fracture, on bisphosphonate and cal/vit D  . Atrial fibrillation     Paroxysmal, prefers ASA over coumadin  . Mixed hyperlipidemia   . Essential hypertension, benign   . Diabetes type 2, controlled     diet controlled  . Pulmonary nodules     stable bilateral on CT 01/2010, rec rpt yearly for 2 yrs, pt decided to stop f/u  . Mitral valve prolapse   . Aortic stenosis   . Anemia   . Arthritis   . History of skin cancer   . Anxiety     with panic attacks  . Diverticulosis     by colonoscopy  . GERD (gastroesophageal reflux disease)   . Glaucoma   . Seasonal allergies   . History of pelvic fracture April 2013  . COPD (chronic obstructive pulmonary disease)     on CT scan 2011  . Chronic gastritis 10/2009    and duodenitis on EGD  . Hiatal hernia 2011    small    Past Surgical History  Procedure Date  . Tonsillectomy   . Cesarean section     (and h/o 6 miscarriages)  . Breast lumpectomy 1983    LEFT, benign  . Patella fracture surgery 05/2006    LEFT surgically repaired with pins and wire  . Dexa 05/2010    T score -4.5 spine, -2.4 femur; does not want to repeat  . Cardiovascular stress test 2010    negative  . Cataract extraction 2012      RIGHT  . Carotid us 2011    mild plaque formation  . Colonoscopy 03/2010    int hemorrhoids, diverticulosis, no need to repeat (Dr. Rourke)  . Esophagogastroduodenoscopy 10/2009    small HH, multiple antric ulcerations s/p biopsy, mild chronic gastritis/duodenitis    Prior to Admission medications   Medication Sig Start Date End Date Taking? Authorizing Provider  acetaminophen (TYLENOL) 500 MG tablet Take 500 mg by mouth daily as needed. For pain   Yes Historical Provider, MD  Calcium Carbonate Antacid (TUMS ULTRA 1000 PO) Take by  mouth as directed.   Yes Historical Provider, MD  Calcium-Magnesium-Vitamin D 500-250-200 MG-MG-UNIT TABS Take 1 tablet by mouth 2 (two) times daily.   Yes Historical Provider, MD  clonazePAM (KLONOPIN) 0.5 MG tablet TAKE 1 TABLET BY MOUTH AT BEDTIME 04/28/12  Yes Javier Gutierrez, MD  docusate sodium (COLACE) 100 MG capsule Take 100 mg by mouth at bedtime as needed. ONE CAPSULE AS NEEDED, MAY TAKE SEVERAL TIMES PER WEEK.   Yes Historical Provider, MD  esomeprazole (NEXIUM) 40 MG capsule Take 1 capsule (40 mg total) by mouth daily. 04/07/12 04/07/13 Yes Javier Gutierrez, MD  estradiol (ESTRACE) 0.1 MG/GM vaginal cream Place 2 g vaginally as directed.     Yes Historical Provider, MD  felodipine (PLENDIL) 5 MG 24 hr tablet TAKE 1 TABLET BY MOUTH EVERY DAY 08/20/11  Yes Peter C Nishan, MD  ibandronate (BONIVA) 3 MG/3ML SOLN injection Inject 3 mLs (3 mg total) into the vein once. 04/05/12  Yes Javier Gutierrez, MD  latanoprost (XALATAN) 0.005 % ophthalmic solution Place 1 drop into both eyes at bedtime.    Yes Historical Provider, MD  levothyroxine (SYNTHROID, LEVOTHROID) 50 MCG tablet Take 1 tablet (50 mcg total) by mouth daily. 03/29/12  Yes Javier Gutierrez, MD  Multiple Vitamin (MULTIVITAMIN) capsule Take 1 capsule by mouth every morning.    Yes Historical Provider, MD  Omega-3 Fatty Acids (FISH OIL CONCENTRATE) 1000 MG CAPS Take 2 capsules by mouth 2 (two) times daily.     Yes Historical Provider, MD  polyethylene glycol (MIRALAX / GLYCOLAX) packet Take 17 g by mouth 3 (three) times a week. As needed for constipation   Yes Historical Provider, MD  sucralfate (CARAFATE) 1 GM/10ML suspension Take 10 mLs (1 g total) by mouth 4 (four) times daily. 05/04/12  Yes Javier Gutierrez, MD  traMADol (ULTRAM) 50 MG tablet Take 1 tablet (50 mg total) by mouth every 6 (six) hours as needed. 04/20/12  Yes Javier Gutierrez, MD  warfarin (COUMADIN) 2.5 MG tablet TAKE AS DIRECTED BY ANTICOAGULATION CLINIC...UP TO 2 TABLETS  DAILY 03/21/12  Yes Samuel G McDowell, MD    Allergies as of 05/20/2012 - Review Complete 05/20/2012  Allergen Reaction Noted  . Propoxyphene-acetaminophen Shortness Of Breath 01/09/2009  . Codeine Nausea And Vomiting   . Meperidine hcl Nausea And Vomiting 01/09/2009  . Morphine Nausea And Vomiting 01/09/2009  . Shellfish allergy  03/29/2012    Family History  Problem Relation Age of Onset  . Coronary artery disease Mother 70    MI deceased  . Colon cancer Father 74  . Arrhythmia Sister   . Stroke Paternal Grandmother   . Diabetes Neg Hx     History   Social History  . Marital Status: Married    Spouse Name: N/A    Number of Children: N/A  . Years of Education: N/A   Occupational History  . Retired     Former RN   Social History   Main Topics  . Smoking status: Former Smoker -- 1.0 packs/day for 20 years    Types: Cigarettes    Quit date: 10/13/1998  . Smokeless tobacco: Never Used  . Alcohol Use: No  . Drug Use: No  . Sexually Active: No   Other Topics Concern  . Not on file   Social History Narrative   Caffeine: rareLives with husband, 5 dogsOccupation: retired, RNActivity: Diet:     Review of Systems: See HPI, otherwise negative ROS  Physical Exam: BP 125/76  Pulse 73  Temp 98.1 F (36.7 C) (Temporal)  Ht 5' 6.5" (1.689 m)  Wt 111 lb 6.4 oz (50.531 kg)  BMI 17.71 kg/m2 General:   Alert, frail, thin pleasant and cooperative in NAD. She is well oriented and conversant. Skin:  Intact without significant lesions or rashes. Eyes:  Sclera clear, no icterus.   Conjunctiva pink. Ears:  Normal auditory acuity. Nose:  No deformity, discharge,  or lesions. Mouth:  No deformity or lesions. Neck:  Supple; no masses or thyromegaly. No significant cervical adenopathy. Lungs:  Clear throughout to auscultation.   No wheezes, crackles, or rhonchi. No acute distress. Heart:  Regular rate and rhythm; no murmurs, clicks, rubs,  or gallops. Abdomen: Non-distended,  normal bowel sounds. No bruits. Soft and nontender without appreciable mass or hepatosplenomegaly, although aorta is quite pulsatile.  Pulses:  Normal pulses noted. Extremities:  Without clubbing or edema. Rectal: Good sphincter tone no mass the rectal vault scant brown stool is Hemoccult negative   Impression/Plan:  Very pleasant, intelligent 78-year-old lady with waxing and waning abdominal pain over the past few years -  worse earlier this year.  However, over the past few months, she perceives it to be somewhat improved. I get a sense that she has developed a fear of eating a regular sized meal as it is likely to produce her abdominal pain. She has lost 9 pounds steadily since March of this year. Gallbladder ultrasound negative. Thickening of the gastric wall with small gastric ulcers (more likely NSAID-related) on prior EGD. Biopsies negative for malignancy or H. pylori infection.  Although she could have biliary dyskinesia to account for her waxing and waning postprandial abdominal discomfort, we need to remain vigilant that it is possible she could have an undiagnosed indolent infiltrating process in her stomach not discernible at prior EGD or on histology. In addition, the clinical scenario is also suspicious for intestinal angina or mesenteric ischemia.  Recommendations: Proceed with an EGD expeditiously to reevaluate her luminal upper GI tract. If this study is unrevealing, then we'll proceed on with a CT angiogram to evaluate her further for critical mesenteric stenosis, etc. I will plan to hold her Coumadin for 4 days prior to the procedure. I will also check with Dr. McDowell to see if bridging is necessary. Further recommendations to follow. I'd like to thank Dr. Gutierrez for allowing me to see this nice lady once again.   

## 2012-05-30 DIAGNOSIS — Z139 Encounter for screening, unspecified: Secondary | ICD-10-CM | POA: Diagnosis not present

## 2012-05-30 LAB — CREATININE, SERUM: Creat: 0.82 mg/dL (ref 0.50–1.10)

## 2012-05-31 ENCOUNTER — Encounter (HOSPITAL_COMMUNITY): Payer: Self-pay | Admitting: Internal Medicine

## 2012-05-31 DIAGNOSIS — H4011X0 Primary open-angle glaucoma, stage unspecified: Secondary | ICD-10-CM | POA: Diagnosis not present

## 2012-05-31 DIAGNOSIS — H409 Unspecified glaucoma: Secondary | ICD-10-CM | POA: Diagnosis not present

## 2012-06-01 ENCOUNTER — Ambulatory Visit (INDEPENDENT_AMBULATORY_CARE_PROVIDER_SITE_OTHER): Payer: Medicare Other | Admitting: *Deleted

## 2012-06-01 ENCOUNTER — Encounter: Payer: Self-pay | Admitting: Family Medicine

## 2012-06-01 DIAGNOSIS — G459 Transient cerebral ischemic attack, unspecified: Secondary | ICD-10-CM | POA: Diagnosis not present

## 2012-06-01 DIAGNOSIS — Z7901 Long term (current) use of anticoagulants: Secondary | ICD-10-CM | POA: Diagnosis not present

## 2012-06-01 DIAGNOSIS — I4891 Unspecified atrial fibrillation: Secondary | ICD-10-CM | POA: Diagnosis not present

## 2012-06-01 LAB — POCT INR: INR: 2.4

## 2012-06-02 ENCOUNTER — Ambulatory Visit (HOSPITAL_COMMUNITY)
Admission: RE | Admit: 2012-06-02 | Discharge: 2012-06-02 | Disposition: A | Discharge status: Home / Self Care | Payer: Medicare Other | Source: Ambulatory Visit | Attending: Internal Medicine | Admitting: Internal Medicine

## 2012-06-02 ENCOUNTER — Other Ambulatory Visit: Payer: Self-pay | Admitting: Internal Medicine

## 2012-06-02 ENCOUNTER — Other Ambulatory Visit: Payer: Self-pay | Admitting: Family Medicine

## 2012-06-02 DIAGNOSIS — I700 Atherosclerosis of aorta: Secondary | ICD-10-CM | POA: Diagnosis not present

## 2012-06-02 DIAGNOSIS — K559 Vascular disorder of intestine, unspecified: Secondary | ICD-10-CM | POA: Diagnosis not present

## 2012-06-02 DIAGNOSIS — R109 Unspecified abdominal pain: Secondary | ICD-10-CM | POA: Diagnosis not present

## 2012-06-02 DIAGNOSIS — I708 Atherosclerosis of other arteries: Secondary | ICD-10-CM | POA: Diagnosis not present

## 2012-06-02 MED ORDER — IOHEXOL 350 MG/ML SOLN
100.0000 mL | Freq: Once | INTRAVENOUS | Status: AC | PRN
  Administered 2012-06-02: 100 mL via INTRAVENOUS

## 2012-06-02 NOTE — Progress Notes (Signed)
Patient is scheduled for a HIDA on Thursday Nov 21st at 8:00 and patient is aware

## 2012-06-03 ENCOUNTER — Encounter: Payer: Self-pay | Admitting: Cardiology

## 2012-06-03 ENCOUNTER — Ambulatory Visit (INDEPENDENT_AMBULATORY_CARE_PROVIDER_SITE_OTHER): Payer: Medicare Other | Admitting: Cardiology

## 2012-06-03 VITALS — BP 110/60 | HR 72 | Ht 66.0 in | Wt 110.0 lb

## 2012-06-03 DIAGNOSIS — I10 Essential (primary) hypertension: Secondary | ICD-10-CM | POA: Diagnosis not present

## 2012-06-03 DIAGNOSIS — I4891 Unspecified atrial fibrillation: Secondary | ICD-10-CM | POA: Diagnosis not present

## 2012-06-03 NOTE — Assessment & Plan Note (Signed)
Blood pressure is normal today. 

## 2012-06-03 NOTE — Progress Notes (Signed)
Clinical Summary Ms. Beilstein is a 78 y.o.female presenting for followup. She was seen back in May. She reports no increasing palpitations, no bleeding episodes on Coumadin. GI workup is noted per chart review.  ECG today shows atrial fibrillation with decreased R wave progression.  She plans to travel to [place] in December. Still tries to remain as active as she is able. States her abdominal pain has improved and her weight has increased somewhat with better appetite. She has occasional mild ankle edema.   Allergies  Allergen Reactions  . Propoxyphene-Acetaminophen Shortness Of Breath  . Codeine Nausea And Vomiting  . Meperidine Hcl Nausea And Vomiting  . Morphine Nausea And Vomiting  . Shellfish Allergy     Current Outpatient Prescriptions  Medication Sig Dispense Refill  . acetaminophen (TYLENOL) 500 MG tablet Take 500 mg by mouth daily as needed. For pain      . Calcium Carbonate Antacid (TUMS ULTRA 1000 PO) Take by mouth as directed.      . Calcium-Magnesium-Vitamin D 500-250-200 MG-MG-UNIT TABS Take 1 tablet by mouth 2 (two) times daily.      Marland Kitchen CARAFATE 1 GM/10ML suspension TAKE 10 MLS (1 G TOTAL) BY MOUTH 4 (FOUR) TIMES DAILY.  420 mL  1  . clonazePAM (KLONOPIN) 0.5 MG tablet TAKE 1 TABLET BY MOUTH AT BEDTIME  60 tablet  0  . docusate sodium (COLACE) 100 MG capsule Take 100 mg by mouth at bedtime as needed. ONE CAPSULE AS NEEDED, MAY TAKE SEVERAL TIMES PER WEEK.      Marland Kitchen esomeprazole (NEXIUM) 40 MG capsule Take 1 capsule (40 mg total) by mouth daily.  90 capsule  3  . estradiol (ESTRACE) 0.1 MG/GM vaginal cream Place 2 g vaginally as directed.        . felodipine (PLENDIL) 5 MG 24 hr tablet TAKE 1 TABLET BY MOUTH EVERY DAY  90 tablet  0  . ibandronate (BONIVA) 3 MG/3ML SOLN injection Inject 3 mg into the vein every 3 (three) months. Last month sometime for last injection  3.6 mL    . latanoprost (XALATAN) 0.005 % ophthalmic solution Place 1 drop into both eyes at bedtime.        Marland Kitchen levothyroxine (SYNTHROID, LEVOTHROID) 50 MCG tablet Take 1 tablet (50 mcg total) by mouth daily.  90 tablet  3  . Multiple Vitamin (MULTIVITAMIN) capsule Take 1 capsule by mouth every morning.       . Omega-3 Fatty Acids (FISH OIL CONCENTRATE) 1000 MG CAPS Take 2 capsules by mouth 2 (two) times daily.        . polyethylene glycol (MIRALAX / GLYCOLAX) packet Take 17 g by mouth 3 (three) times a week. As needed for constipation      . traMADol (ULTRAM) 50 MG tablet Take 1 tablet (50 mg total) by mouth every 6 (six) hours as needed.  40 tablet  1  . warfarin (COUMADIN) 2.5 MG tablet TAKE AS DIRECTED BY ANTICOAGULATION CLINIC...UP TO 2 TABLETS DAILY  60 tablet  2  . [DISCONTINUED] Calcium Carbonate-Vitamin D (CALCIUM PLUS VITAMIN D PO) Take 2 tablets by mouth daily.         No current facility-administered medications for this visit.   Facility-Administered Medications Ordered in Other Visits  Medication Dose Route Frequency Provider Last Rate Last Dose  . [COMPLETED] iohexol (OMNIPAQUE) 350 MG/ML injection 100 mL  100 mL Intravenous Once PRN Medication Radiologist, MD   100 mL at 06/02/12 0930    Past Medical History  Diagnosis Date  . History of TIA (transient ischemic attack) remote  . Hypothyroidism   . History of rheumatic fever   . Osteoporosis     Thoracic compression fracture, on bisphosphonate and cal/vit D  . Atrial fibrillation     Paroxysmal  . Mixed hyperlipidemia   . Essential hypertension, benign   . Diabetes type 2, controlled     Diet controlled  . Pulmonary nodules     Stable bilateral on CT 01/2010, rec rpt yearly for 2 yrs, pt decided to stop f/u  . Mitral valve prolapse   . Aortic stenosis   . Anemia   . Arthritis   . History of skin cancer   . Anxiety   . Diverticulosis   . GERD (gastroesophageal reflux disease)   . Glaucoma   . Seasonal allergies   . History of pelvic fracture April 2013  . COPD (chronic obstructive pulmonary disease)   . Chronic  gastritis 10/2009    Duodenitis on EGD  . Hiatal hernia 2011    Small    Social History Ms. Losurdo reports that she quit smoking about 13 years ago. Her smoking use included Cigarettes. She has a 20 pack-year smoking history. She has never used smokeless tobacco. Ms. Torney reports that she does not drink alcohol.  Review of Systems Negative except as outlined.  Physical Examination Filed Vitals:   06/03/12 0828  BP: 110/60  Pulse: 72   Filed Weights   06/03/12 0828  Weight: 110 lb (49.896 kg)    Thin elderly woman in no acute distress.  HEENT: Conjunctiva and lids normal, oropharynx clear.  Neck: Supple no elevated venous pressure or carotid bruits.  Cardiac: Irregularly irregular, no pathologic systolic murmur.  Lungs: Clear to auscultation. Nonlabored breathing at rest.  Extremities: No pitting edema.  Problem List and Plan   Atrial fibrillation Persistent, rate controlled and on Coumadin. Continue current strategy.  ESSENTIAL HYPERTENSION, BENIGN Blood pressure is normal today.    [name], M.D., F.A.C.C.

## 2012-06-03 NOTE — Assessment & Plan Note (Signed)
Persistent, rate controlled and on Coumadin. Continue current strategy.

## 2012-06-03 NOTE — Patient Instructions (Addendum)
Your physician recommends that you schedule a follow-up appointment in: 6 MONTHS WITH SM 

## 2012-06-06 ENCOUNTER — Emergency Department (HOSPITAL_COMMUNITY)
Admission: EM | Admit: 2012-06-06 | Discharge: 2012-06-06 | Disposition: A | Discharge status: Home / Self Care | Payer: Medicare Other | Attending: Emergency Medicine | Admitting: Emergency Medicine

## 2012-06-06 ENCOUNTER — Emergency Department (HOSPITAL_COMMUNITY): Payer: Medicare Other

## 2012-06-06 ENCOUNTER — Encounter (HOSPITAL_COMMUNITY): Payer: Self-pay

## 2012-06-06 ENCOUNTER — Telehealth: Payer: Self-pay | Admitting: Family Medicine

## 2012-06-06 DIAGNOSIS — E782 Mixed hyperlipidemia: Secondary | ICD-10-CM | POA: Diagnosis not present

## 2012-06-06 DIAGNOSIS — E119 Type 2 diabetes mellitus without complications: Secondary | ICD-10-CM | POA: Insufficient documentation

## 2012-06-06 DIAGNOSIS — I10 Essential (primary) hypertension: Secondary | ICD-10-CM | POA: Insufficient documentation

## 2012-06-06 DIAGNOSIS — R262 Difficulty in walking, not elsewhere classified: Secondary | ICD-10-CM | POA: Diagnosis not present

## 2012-06-06 DIAGNOSIS — I69998 Other sequelae following unspecified cerebrovascular disease: Secondary | ICD-10-CM | POA: Diagnosis not present

## 2012-06-06 DIAGNOSIS — Z7901 Long term (current) use of anticoagulants: Secondary | ICD-10-CM | POA: Insufficient documentation

## 2012-06-06 DIAGNOSIS — Z79899 Other long term (current) drug therapy: Secondary | ICD-10-CM | POA: Insufficient documentation

## 2012-06-06 DIAGNOSIS — E079 Disorder of thyroid, unspecified: Secondary | ICD-10-CM | POA: Insufficient documentation

## 2012-06-06 DIAGNOSIS — Z8673 Personal history of transient ischemic attack (TIA), and cerebral infarction without residual deficits: Secondary | ICD-10-CM | POA: Diagnosis not present

## 2012-06-06 DIAGNOSIS — R42 Dizziness and giddiness: Secondary | ICD-10-CM

## 2012-06-06 DIAGNOSIS — R5383 Other fatigue: Secondary | ICD-10-CM | POA: Insufficient documentation

## 2012-06-06 DIAGNOSIS — Z862 Personal history of diseases of the blood and blood-forming organs and certain disorders involving the immune mechanism: Secondary | ICD-10-CM | POA: Insufficient documentation

## 2012-06-06 DIAGNOSIS — Z8739 Personal history of other diseases of the musculoskeletal system and connective tissue: Secondary | ICD-10-CM | POA: Diagnosis not present

## 2012-06-06 DIAGNOSIS — Z87891 Personal history of nicotine dependence: Secondary | ICD-10-CM | POA: Diagnosis not present

## 2012-06-06 DIAGNOSIS — Z8719 Personal history of other diseases of the digestive system: Secondary | ICD-10-CM | POA: Diagnosis not present

## 2012-06-06 DIAGNOSIS — Z9889 Other specified postprocedural states: Secondary | ICD-10-CM | POA: Insufficient documentation

## 2012-06-06 DIAGNOSIS — Z8679 Personal history of other diseases of the circulatory system: Secondary | ICD-10-CM | POA: Insufficient documentation

## 2012-06-06 DIAGNOSIS — R5381 Other malaise: Secondary | ICD-10-CM | POA: Insufficient documentation

## 2012-06-06 LAB — PROTIME-INR
INR: 2.08 — ABNORMAL HIGH (ref 0.00–1.49)
Prothrombin Time: 22.5 seconds — ABNORMAL HIGH (ref 11.6–15.2)

## 2012-06-06 LAB — COMPREHENSIVE METABOLIC PANEL
Albumin: 3.5 g/dL (ref 3.5–5.2)
Alkaline Phosphatase: 71 U/L (ref 39–117)
BUN: 13 mg/dL (ref 6–23)
Calcium: 9.6 mg/dL (ref 8.4–10.5)
GFR calc Af Amer: >90 mL/min (ref >90)
Potassium: 3.7 mEq/L (ref 3.5–5.1)
Total Protein: 7.2 g/dL (ref 6.0–8.3)

## 2012-06-06 LAB — CBC WITH DIFFERENTIAL/PLATELET
Basophils Relative: 1 % (ref 0–1)
Eosinophils Absolute: 0.1 10*3/uL (ref 0.0–0.7)
Eosinophils Relative: 1 % (ref 0–5)
Hemoglobin: 14.5 g/dL (ref 12.0–15.0)
MCH: 33 pg (ref 26.0–34.0)
MCHC: 34.3 g/dL (ref 30.0–36.0)
MCV: 96.1 fL (ref 78.0–100.0)
Monocytes Relative: 6 % (ref 3–12)
Neutrophils Relative %: 73 % (ref 43–77)
Platelets: 302 10*3/uL (ref 150–400)

## 2012-06-06 LAB — APTT: aPTT: 38 seconds — ABNORMAL HIGH (ref 24–37)

## 2012-06-06 MED ORDER — ONDANSETRON HCL 4 MG PO TABS: 4.0000 mg | ORAL_TABLET | Freq: Four times a day (QID) | ORAL | Status: DC

## 2012-06-06 MED ORDER — MECLIZINE HCL 25 MG PO TABS: 50.0000 mg | ORAL_TABLET | Freq: Four times a day (QID) | ORAL | Status: DC | PRN

## 2012-06-06 MED ORDER — MECLIZINE HCL 12.5 MG PO TABS
25.0000 mg | ORAL_TABLET | Freq: Once | ORAL | Status: AC
  Administered 2012-06-06: 25 mg via ORAL
  Filled 2012-06-06: qty 2

## 2012-06-06 MED ORDER — ONDANSETRON 4 MG PO TBDP
4.0000 mg | ORAL_TABLET | Freq: Once | ORAL | Status: AC
  Administered 2012-06-06: 4 mg via ORAL
  Filled 2012-06-06: qty 1

## 2012-06-06 NOTE — ED Notes (Signed)
Patient with no complaints at this time. Respirations even and unlabored. Skin warm/dry. Discharge instructions reviewed with patient at this time. Patient given opportunity to voice concerns/ask questions. Patient discharged at this time and left Emergency Department with steady gait.   

## 2012-06-06 NOTE — ED Provider Notes (Signed)
History   This chart was scribed for Ward Givens, MD by Charolett Bumpers, ER Scribe. The patient was seen in room APA05/APA05. Patient's care was started at 12:13PM.    CSN: [id number]  Arrival date & time 06/06/12  1115   First MD Initiated Contact with Patient 06/06/12 1213      Chief Complaint  Patient presents with  . Dizziness     The history is provided by the patient. No language interpreter was used.    [name] is a 78 y.o. female who presents to the Emergency Department complaining of dizziness upon waking up at 6 am this morning. States when she raised her head she felt like she was going to fall down or pass out. She states when she put her head back down it would last for a few more minutes. Pt states that she felt as if she would fall down or pass out upon standing up. Pt states that laying back down helps with the dizziness. She reports she was able to walk until finally about 10 AM her husband was able to help her get to the bathroom. Pt reports that she currently feels as if her head is spinning. Pt is positive for weakness in the left leg which is old from prior surgery,, and worsened staggering than her baseline staggering. Pt is negative for headache, nausea, vomiting, chest pain, palpitations SOB. Pt has had a h/o of similar symptoms 2 years ago when she was having atrial arrhythmias. INR 2.2 when it was last checked 4 days ago. Pt has h/o of mini stroke in the past that mainly involved her speech.   Pt's cardiologist is Dr. Diona Browner and PCP is Dr. Sharen Hones.    Past Medical History  Diagnosis Date  . History of TIA (transient ischemic attack) remote  . Hypothyroidism   . History of rheumatic fever   . Osteoporosis     Thoracic compression fracture, on bisphosphonate and cal/vit D  . Atrial fibrillation     Paroxysmal  . Mixed hyperlipidemia   . Essential hypertension, benign   . Diabetes type 2, controlled     Diet controlled  . Pulmonary  nodules     Stable bilateral on CT 01/2010, rec rpt yearly for 2 yrs, pt decided to stop f/u  . Mitral valve prolapse   . Aortic stenosis   . Anemia   . Arthritis   . History of skin cancer   . Anxiety   . Diverticulosis   . GERD (gastroesophageal reflux disease)   . Glaucoma   . Seasonal allergies   . History of pelvic fracture April 2013  . COPD (chronic obstructive pulmonary disease)   . Chronic gastritis 10/2009    Duodenitis on EGD  . Hiatal hernia 2011    Small    Past Surgical History  Procedure Date  . Tonsillectomy   . Cesarean section     (and h/o 6 miscarriages)  . Breast lumpectomy 1983    LEFT, benign  . Patella fracture surgery 05/2006    LEFT surgically repaired with pins and wire  . Dexa 05/2010    T score -4.5 spine, -2.4 femur; does not want to repeat  . Cardiovascular stress test 2010    negative  . Cataract extraction 2012    RIGHT  . Carotid US 2011    mild plaque formation  . Colonoscopy 03/2010    int hemorrhoids, diverticulosis, no need to repeat (Dr. Kendell Bane)  . Esophagogastroduodenoscopy  10/2009    small HH, multiple antric ulcerations s/p biopsy, mild chronic gastritis/duodenitis  . Esophagogastroduodenoscopy 05/26/2012    small HH, o/w normal. Surgeon: Corbin Ade, MD    Family History  Problem Relation Age of Onset  . Coronary artery disease Mother 61    MI deceased  . Colon cancer Father 3  . Arrhythmia Sister   . Stroke Paternal Grandmother   . Diabetes Neg Hx     History  Substance Use Topics  . Smoking status: Former Smoker -- 1.0 packs/day for 20 years    Types: Cigarettes    Quit date: 10/13/1998  . Smokeless tobacco: Never Used  . Alcohol Use: No  Lives with spouse Lives at home  OB History    Grav Para Term Preterm Abortions TAB SAB Ect Mult Living                  Review of Systems  Respiratory: Negative for shortness of breath.   Cardiovascular: Negative for chest pain.  Gastrointestinal: Negative for  nausea and vomiting.  Musculoskeletal: Positive for gait problem (increased staggering).  Neurological: Positive for dizziness and weakness (weakness in left leg). Negative for headaches.  All other systems reviewed and are negative.    Allergies  Propoxyphene-acetaminophen; Codeine; Meperidine hcl; Morphine; and Shellfish allergy  Home Medications   Current Outpatient Rx  Name  Route  Sig  Dispense  Refill  . ACETAMINOPHEN 500 MG PO TABS   Oral   Take 500 mg by mouth daily as needed. For pain         . TUMS ULTRA 1000 PO   Oral   Take 1 tablet by mouth as directed. Heartburn.         Marland Kitchen CALCIUM-MAGNESIUM-VITAMIN D 500-250-200 MG-MG-UNIT PO TABS   Oral   Take 1 tablet by mouth 2 (two) times daily.         Marland Kitchen CARAFATE 1 GM/10ML PO SUSP      TAKE 10 MLS (1 G TOTAL) BY MOUTH 4 (FOUR) TIMES DAILY.   420 mL   1   . CLONAZEPAM 0.5 MG PO TABS      TAKE 1 TABLET BY MOUTH AT BEDTIME   60 tablet   0   . DOCUSATE SODIUM 100 MG PO CAPS   Oral   Take 100 mg by mouth at bedtime as needed. ONE CAPSULE AS NEEDED, MAY TAKE SEVERAL TIMES PER WEEK. Constipation.         Marland Kitchen ESOMEPRAZOLE MAGNESIUM 40 MG PO CPDR   Oral   Take 1 capsule (40 mg total) by mouth daily.   90 capsule   3   . ESTRADIOL 0.1 MG/GM VA CREA   Vaginal   Place 2 g vaginally as directed.           Marland Kitchen FELODIPINE ER 5 MG PO TB24      TAKE 1 TABLET BY MOUTH EVERY DAY   90 tablet   0   . IBANDRONATE SODIUM 3 MG/3ML IV SOLN   Intravenous   Inject 3 mg into the vein every 3 (three) months. Last month sometime for last injection   3.6 mL      . LATANOPROST 0.005 % OP SOLN   Both Eyes   Place 1 drop into both eyes at bedtime.          Marland Kitchen LEVOTHYROXINE SODIUM 50 MCG PO TABS   Oral   Take 1 tablet (50 mcg total) by mouth daily.  90 tablet   3   . MULTIVITAMINS PO CAPS   Oral   Take 1 capsule by mouth every morning.          Marland Kitchen FISH OIL CONCENTRATE 1000 MG PO CAPS   Oral   Take 2 capsules by  mouth 2 (two) times daily.           Marland Kitchen POLYETHYLENE GLYCOL 3350 PO PACK   Oral   Take 17 g by mouth 3 (three) times a week. As needed for constipation         . TRAMADOL HCL 50 MG PO TABS   Oral   Take 1 tablet (50 mg total) by mouth every 6 (six) hours as needed.   40 tablet   1   . WARFARIN SODIUM 2.5 MG PO TABS      TAKE AS DIRECTED BY ANTICOAGULATION CLINIC...UP TO 2 TABLETS DAILY   60 tablet   2     BP 147/72  Pulse 92  Temp 97.7 F (36.5 C) (Oral)  Resp 18  Ht 5\' 6"  (1.676 m)  Wt 107 lb (48.535 kg)  BMI 17.27 kg/m2  SpO2 98%  Vital signs normal    Physical Exam  Nursing note and vitals reviewed. Constitutional: She is oriented to person, place, and time. She appears well-developed and well-nourished. No distress.  HENT:  Head: Normocephalic and atraumatic.  Right Ear: External ear normal.  Left Ear: External ear normal.  Nose: Nose normal.  Mouth/Throat: Oropharynx is clear and moist.  Eyes: Conjunctivae normal and EOM are normal. Pupils are equal, round, and reactive to light.  Neck: Normal range of motion. Neck supple. No tracheal deviation present.  Cardiovascular: Normal rate and normal heart sounds.  An irregular rhythm present.       Irregular heart beat.   Pulmonary/Chest: Effort normal and breath sounds normal. No respiratory distress. She has no wheezes. She has no rales.  Abdominal: Soft. She exhibits no distension.  Musculoskeletal: Normal range of motion. She exhibits no edema.  Neurological: She is alert and oriented to person, place, and time. No cranial nerve deficit.       FTN intact bilaterally. Minimal left pronator drift. Moves all extremities normally. Cranial nerves intact. Grips equal  During grip exam pt started to twist my left finger, when I asked her to stop, she then said "If I was going to twist your finger I would do this" and then twisted both of my fingers and then she and her husband laughed.   Skin: Skin is warm and dry.    Psychiatric: She has a normal mood and affect. Her behavior is normal.    ED Course  Procedures (including critical care time)   Medications  meclizine (ANTIVERT) tablet 25 mg (25 mg Oral Given 06/06/12 1306)  ondansetron (ZOFRAN-ODT) disintegrating tablet 4 mg (4 mg Oral Given 06/06/12 1306)     DIAGNOSTIC STUDIES: Oxygen Saturation is 98% on room air, normal by my interpretation.    CT of head done to make sure there was no intracranial bleeding. MRI done for concern for cerebellar infarct with patient's history of worsening staggering and vertigo symptoms. However it was normal.  COORDINATION OF CARE:  12:45PM Discussed planned course of treatment with the patient, who is agreeable at this time.   Results for orders placed during the hospital encounter of 06/06/12  GLUCOSE, CAPILLARY      Component Value Range   Glucose-Capillary 193 (*) 70 - 99 mg/dL  CBC  WITH DIFFERENTIAL      Component Value Range   WBC 8.6  4.0 - 10.5 K/uL   RBC 4.40  3.87 - 5.11 MIL/uL   Hemoglobin 14.5  12.0 - 15.0 g/dL   HCT 09.8  11.9 - 14.7 %   MCV 96.1  78.0 - 100.0 fL   MCH 33.0  26.0 - 34.0 pg   MCHC 34.3  30.0 - 36.0 g/dL   RDW 82.9  56.2 - 13.0 %   Platelets 302  150 - 400 K/uL   Neutrophils Relative 73  43 - 77 %   Neutro Abs 6.3  1.7 - 7.7 K/uL   Lymphocytes Relative 20  12 - 46 %   Lymphs Abs 1.7  0.7 - 4.0 K/uL   Monocytes Relative 6  3 - 12 %   Monocytes Absolute 0.5  0.1 - 1.0 K/uL   Eosinophils Relative 1  0 - 5 %   Eosinophils Absolute 0.1  0.0 - 0.7 K/uL   Basophils Relative 1  0 - 1 %   Basophils Absolute 0.0  0.0 - 0.1 K/uL  COMPREHENSIVE METABOLIC PANEL      Component Value Range   Sodium 142  135 - 145 mEq/L   Potassium 3.7  3.5 - 5.1 mEq/L   Chloride 105  96 - 112 mEq/L   CO2 29  19 - 32 mEq/L   Glucose, Bld 173 (*) 70 - 99 mg/dL   BUN 13  6 - 23 mg/dL   Creatinine, Ser 8.65  0.50 - 1.10 mg/dL   Calcium 9.6  8.4 - 78.4 mg/dL   Total Protein 7.2  6.0 - 8.3 g/dL    Albumin 3.5  3.5 - 5.2 g/dL   AST 27  0 - 37 U/L   ALT 26  0 - 35 U/L   Alkaline Phosphatase 71  39 - 117 U/L   Total Bilirubin 0.3  0.3 - 1.2 mg/dL   GFR calc non Af Amer 79 (*) >90 mL/min   GFR calc Af Amer >90  >90 mL/min  APTT      Component Value Range   aPTT 38 (*) 24 - 37 seconds  PROTIME-INR      Component Value Range   Prothrombin Time 22.5 (*) 11.6 - 15.2 seconds   INR 2.08 (*) 0.00 - 1.49   Laboratory interpretation all normal except therapeutic INR   Ct Head Wo Contrast  06/06/2012  *RADIOLOGY REPORT*  Clinical Data: Dizziness and weakness.  Evaluate for hemorrhage on Coumadin therapy.  CT HEAD WITHOUT CONTRAST  Technique:  Contiguous axial images were obtained from the base of the skull through the vertex without contrast.  Comparison: MRA brain 06/06/2012 and CT of 01/28/2010.  Findings: No evidence of acute infarct, acute hemorrhage, mass lesion, mass effect or hydrocephalus.  Mild atrophy.  Minimal periventricular low attenuation.  No air fluid levels in the visualized portions of the paranasal sinuses and mastoid air cells.  IMPRESSION:  1.  No acute intracranial abnormality 2.  Mild atrophy and minimal chronic microvascular white matter ischemic changes.   Original Report Authenticated By: [name] Battles, M.D.    Mr Brain Wo Contrast  06/06/2012  *RADIOLOGY REPORT*  Clinical Data: Dizziness.  Difficulty walking.  MRI HEAD WITHOUT CONTRAST  Technique:  Multiplanar, multiecho pulse sequences of the brain and surrounding structures were obtained according to standard protocol without intravenous contrast.  Comparison: 01/28/2010  Findings: The brain shows generalized age related atrophy. The brainstem is  normal.  There are a few small vessel cerebellar infarctions.  There are mild chronic appearing small vessel changes of the periventricular deep white matter, less than often seen in healthy individuals of this age.  No cortical or large vessel territory infarction.  No mass  lesion, hemorrhage, hydrocephalus or extra-axial collection.  No pituitary mass.  There is mild mucosal inflammation of the paranasal sinuses.  IMPRESSION: No acute or reversible brain pathology.  Mild age related atrophy with minimal small vessel change of the deep white matter, less than often seen in healthy individuals of this age.   Original Report Authenticated By: Paulina Fusi, M.D.      Date: 06/06/2012  Rate: 80  Rhythm: atrial fibrillation  QRS Axis: normal  Intervals: normal  ST/T Wave abnormalities: normal  Conduction Disutrbances:none  Narrative Interpretation:   Old EKG Reviewed: unchanged from 10/14/2011   1. Vertigo    New Prescriptions   MECLIZINE (ANTIVERT) 25 MG TABLET    Take 2 tablets (50 mg total) by mouth 4 (four) times daily as needed for dizziness.   ONDANSETRON (ZOFRAN) 4 MG TABLET    Take 1 tablet (4 mg total) by mouth every 6 (six) hours.    Plan discharge  Devoria Albe, MD, FACEP   MDM  I personally performed the services described in this documentation, which was scribed in my presence. The recorded information has been reviewed and considered.  Devoria Albe, MD, Armando Gang    Ward Givens, MD 06/06/12 [phone]

## 2012-06-06 NOTE — ED Notes (Signed)
Pt reports woke up this morning around 6am and was so dizzy she couldn't get up out of bed.  Also reports was diaphoretic.    Reports around 10am was able to get up and walk to the living room.  Pt called her pcp and was told to come here for evaluation.  Pt says her R ear has been stopped up for a few days.  C/O generalized weakness, denies chest pain.  Reports has afib.

## 2012-06-06 NOTE — Telephone Encounter (Signed)
Patient Information:  Caller Name: Kendi  Phone: [phone]  Patient: [name], [name]  Gender: Female  DOB: 1925/10/09  Age: 78 Years  PCP: Eustaquio Boyden [facility])   Symptoms  Reason For Call & Symptoms: Pt has been severely dizzy since 6am. She has atrial fib. She also has hx of TIA. When she did walk this am she was "falling to one side".  Reviewed Health History In EMR: Yes  Reviewed Medications In EMR: Yes  Reviewed Allergies In EMR: Yes  Date of Onset of Symptoms: 06/06/2012  Treatments Tried: fluids/sitting upright  Treatments Tried Worked: No  Guideline(s) Used:  Dizziness  Disposition Per Guideline:   Call EMS 911 Now  Reason For Disposition Reached:   New neurologic deficit that is present now:   Weakness of the face, arm, or leg on one side of the body  Numbness of the face, arm, or leg on one side of the body  Loss of speech or garbled speech  Advice Given:  N/A  Office Follow Up:  Does the office need to follow up with this patient?: No  Instructions For The Office: N/A

## 2012-06-06 NOTE — Telephone Encounter (Signed)
Noted. Can we call tomorrow for an update? 

## 2012-06-07 ENCOUNTER — Telehealth: Payer: Self-pay | Admitting: Family Medicine

## 2012-06-07 ENCOUNTER — Telehealth: Payer: Self-pay | Admitting: Internal Medicine

## 2012-06-07 NOTE — Telephone Encounter (Signed)
[facility] Triage Call Report Triage Record Num: [id number] Operator: Albertine Grates Patient Name: Anastasiya Gowin Call Date & Time: 06/06/2012 5:40:47PM Patient Phone: [phone] PCP: Eustaquio Boyden Patient Gender: Female PCP Fax : [phone] Patient DOB: 03-11-1926 [facility] Name: [facility] Reason for Call: Caller: Ann/Patient; PCP: Ruthe Mannan (Family Practice); CB#: [phone]; States spoke to nurse 11-18 and was advised to go to ED. Was seen at [facility] and told was not having CVA or did not have bleeding out due to too much Coumadin. Is calling back to inform office that is feeling better. Will call if needed. Protocol(s) Used: Office Note Recommended Outcome per Protocol: Information Noted and Sent to Office Reason for Outcome: Caller information to office Care Advice: ~ 06/06/2012 5:46:39PM Page 1 of 1 [id number]

## 2012-06-07 NOTE — Telephone Encounter (Signed)
Pt was seen in the ER recently and has questions about her medications. She said she would call back after 12 when RMR's nurse was available or you can reach her at [phone]

## 2012-06-07 NOTE — Telephone Encounter (Signed)
Pt called back and left voicemail- she stated she just wanted to let RMR know that she was put on antivert and zofran for vertigo when she went to the ED. She stated she was doing fine now.

## 2012-06-07 NOTE — Telephone Encounter (Signed)
Noted. Thanks.

## 2012-06-08 NOTE — Telephone Encounter (Signed)
See phone note

## 2012-06-09 ENCOUNTER — Ambulatory Visit (INDEPENDENT_AMBULATORY_CARE_PROVIDER_SITE_OTHER): Payer: Medicare Other | Admitting: *Deleted

## 2012-06-09 ENCOUNTER — Ambulatory Visit (HOSPITAL_COMMUNITY)
Admission: RE | Admit: 2012-06-09 | Discharge: 2012-06-09 | Disposition: A | Discharge status: Home / Self Care | Payer: Medicare Other | Source: Ambulatory Visit | Attending: Internal Medicine | Admitting: Internal Medicine

## 2012-06-09 ENCOUNTER — Encounter (HOSPITAL_COMMUNITY): Payer: Self-pay

## 2012-06-09 DIAGNOSIS — I4891 Unspecified atrial fibrillation: Secondary | ICD-10-CM

## 2012-06-09 DIAGNOSIS — R109 Unspecified abdominal pain: Secondary | ICD-10-CM | POA: Insufficient documentation

## 2012-06-09 DIAGNOSIS — Z7901 Long term (current) use of anticoagulants: Secondary | ICD-10-CM

## 2012-06-09 DIAGNOSIS — G459 Transient cerebral ischemic attack, unspecified: Secondary | ICD-10-CM

## 2012-06-09 HISTORY — DX: Systemic involvement of connective tissue, unspecified: M35.9

## 2012-06-09 LAB — POCT INR: INR: 2.5

## 2012-06-09 MED ORDER — TECHNETIUM TC 99M MEBROFENIN IV KIT
5.0000 | PACK | Freq: Once | INTRAVENOUS | Status: AC | PRN
  Administered 2012-06-09: 5 via INTRAVENOUS

## 2012-06-10 ENCOUNTER — Ambulatory Visit (INDEPENDENT_AMBULATORY_CARE_PROVIDER_SITE_OTHER): Payer: Medicare Other | Admitting: Family Medicine

## 2012-06-10 ENCOUNTER — Encounter: Payer: Self-pay | Admitting: Family Medicine

## 2012-06-10 VITALS — BP 128/70 | HR 72 | Temp 97.2°F | Wt 108.8 lb

## 2012-06-10 DIAGNOSIS — K219 Gastro-esophageal reflux disease without esophagitis: Secondary | ICD-10-CM | POA: Diagnosis not present

## 2012-06-10 DIAGNOSIS — R1013 Epigastric pain: Secondary | ICD-10-CM | POA: Diagnosis not present

## 2012-06-10 DIAGNOSIS — H6120 Impacted cerumen, unspecified ear: Secondary | ICD-10-CM | POA: Diagnosis not present

## 2012-06-10 DIAGNOSIS — K5900 Constipation, unspecified: Secondary | ICD-10-CM

## 2012-06-10 MED ORDER — TRAMADOL HCL 50 MG PO TABS: 50.0000 mg | ORAL_TABLET | Freq: Four times a day (QID) | ORAL | Status: DC | PRN

## 2012-06-10 MED ORDER — CLONAZEPAM 0.5 MG PO TABS: 0.5000 mg | ORAL_TABLET | Freq: Every evening | ORAL | Status: DC | PRN

## 2012-06-10 NOTE — Patient Instructions (Signed)
May stop carafate for next 2 weeks to see if any improvement. Trial of amitiza for constipation - take for next 12 days and call me with results.  If helping, I will prescribe this. Continue tramadol and nexium. Refilled tramadol and klonopin for 3 months - prescriptions provided today. Good to see you today, call us with questions. Have a safe trip!

## 2012-06-10 NOTE — Progress Notes (Signed)
  Subjective:    Patient ID: [name], female    DOB: [birth year], 78 y.o.   MRN: [id number]  HPI CC: discuss recent tests  See prior notes for details.    abd issues - seen by Dr. Jena Gauss: EGD showing HH o/w normal. CTA abdomen - no evidence of mesenteric ischemia. HIDA - WNL.  In interim, seen at ER for dizziness/vertigo with unrevealing head CT and MRI - negative for CVA.  Treated with meclizine, resolved.  [name] presents today to review all these studies as well as a checkup prior to her pending 3 month trip to [place].  Pending trip 06/30/2012.  Abd pain has continued but significantly improved with stricter GERD diet.  Still taking nexium and tramadol for breakthrough pain.  Wonders about stopping carafate.  Tramadol 1 pill helps for 24+ hours.  H/o chronic constipation, already takes miralax 3x/wk and daily colace.  Also - R ear stopped up.  Told has cerumen impaction in past.  Requests cleaning.  Affecting hearing.  Weight steady. Wt Readings from Last 3 Encounters:  06/10/12 108 lb 12 oz (49.329 kg)  06/06/12 107 lb (48.535 kg)  06/03/12 110 lb (49.896 kg)    Past Medical History  Diagnosis Date  . History of TIA (transient ischemic attack) remote  . Hypothyroidism   . History of rheumatic fever   . Osteoporosis     Thoracic compression fracture, on bisphosphonate and cal/vit D  . Atrial fibrillation     Paroxysmal  . Mixed hyperlipidemia   . Essential hypertension, benign   . Diabetes type 2, controlled     Diet controlled  . Pulmonary nodules     Stable bilateral on CT 01/2010, rec rpt yearly for 2 yrs, pt decided to stop f/u  . Mitral valve prolapse   . Aortic stenosis   . Anemia   . Arthritis   . History of skin cancer   . Anxiety   . Diverticulosis   . GERD (gastroesophageal reflux disease)   . Glaucoma   . Seasonal allergies   . History of pelvic fracture April 2013  . COPD (chronic obstructive pulmonary disease)   . Chronic  gastritis 10/2009    Duodenitis on EGD  . Hiatal hernia 2011    Small  . Cancer   . Collagen vascular disease     Review of Systems Per HPI    Objective:   Physical Exam  Nursing note and vitals reviewed. Constitutional: She appears well-developed and well-nourished. No distress.  HENT:  Head: Normocephalic and atraumatic.  Right Ear: Hearing, tympanic membrane and external ear normal.  Left Ear: Hearing, tympanic membrane, external ear and ear canal normal.  Mouth/Throat: Oropharynx is clear and moist. No oropharyngeal exudate.       R cerumen impaction affecting hearing, dry wax removed with first plastic ear curette then with alligator forceps.  Pt endorses improved hearing afterwards.  Abdominal: Soft. Bowel sounds are normal. She exhibits no distension and no mass. There is no hepatosplenomegaly. There is tenderness (mild) in the periumbilical area. There is no rebound, no guarding and no CVA tenderness.  Skin: Skin is warm and dry. No rash noted.  Psychiatric: She has a normal mood and affect.       Assessment & Plan:

## 2012-06-10 NOTE — Assessment & Plan Note (Signed)
Disimpacted in office, improved hearing afterwards.  Dry wax/dry skin removed with alligator forceps.

## 2012-06-10 NOTE — Assessment & Plan Note (Signed)
Improved with adherence to GERD diet. Thorough GI w/u unrevealing. Continue nexium daily.  May stop carafate. H pylori IgG negative. May use tramadol PRN.

## 2012-06-10 NOTE — Assessment & Plan Note (Signed)
I wonder if this is contributing to abd pain.  Already taking miralax and colace regularly. Add amitiza.  Pt agrees with plan.  Start at daily - provided with samples.  Pt will call us with effect of this med, consider prescribing if helping.

## 2012-06-10 NOTE — Assessment & Plan Note (Signed)
Seems well controlled. Continue nexium. EGD did not have evidence of significant gastritis. Discussed stopping carafate for next 2 wks to see if any change in sxs, if not worse, likely doesn't need and may stop prior to trip

## 2012-06-14 ENCOUNTER — Other Ambulatory Visit: Payer: Self-pay | Admitting: Cardiology

## 2012-06-14 NOTE — Telephone Encounter (Signed)
rx sent to pharmacy by e-script for 3 months based on pt compliance

## 2012-06-15 ENCOUNTER — Other Ambulatory Visit: Payer: Self-pay | Admitting: Obstetrics and Gynecology

## 2012-06-15 ENCOUNTER — Other Ambulatory Visit (HOSPITAL_COMMUNITY)
Admission: RE | Admit: 2012-06-15 | Discharge: 2012-06-15 | Disposition: A | Discharge status: Home / Self Care | Payer: Medicare Other | Source: Ambulatory Visit | Attending: Obstetrics and Gynecology | Admitting: Obstetrics and Gynecology

## 2012-06-15 DIAGNOSIS — Z01419 Encounter for gynecological examination (general) (routine) without abnormal findings: Secondary | ICD-10-CM | POA: Insufficient documentation

## 2012-06-15 DIAGNOSIS — Z1151 Encounter for screening for human papillomavirus (HPV): Secondary | ICD-10-CM | POA: Insufficient documentation

## 2012-06-15 DIAGNOSIS — Z1212 Encounter for screening for malignant neoplasm of rectum: Secondary | ICD-10-CM | POA: Diagnosis not present

## 2012-06-23 ENCOUNTER — Telehealth: Payer: Self-pay

## 2012-06-23 ENCOUNTER — Encounter: Payer: Self-pay | Admitting: Internal Medicine

## 2012-06-23 NOTE — Telephone Encounter (Signed)
Ok to do.  May stop carafate for now.

## 2012-06-23 NOTE — Telephone Encounter (Signed)
Pt seen 06/10/12; Pt stopped Amitiza due to diarrhea; pt stopped Carafate as instructed and pt is doing fine; pt does not want to restart Carafate.

## 2012-06-27 ENCOUNTER — Encounter: Payer: Self-pay | Admitting: Gastroenterology

## 2012-06-27 ENCOUNTER — Ambulatory Visit: Payer: Medicare Other | Admitting: Gastroenterology

## 2012-06-27 ENCOUNTER — Ambulatory Visit (INDEPENDENT_AMBULATORY_CARE_PROVIDER_SITE_OTHER): Payer: Medicare Other | Admitting: Gastroenterology

## 2012-06-27 VITALS — BP 97/63 | HR 68 | Temp 97.2°F | Ht 66.0 in | Wt 108.8 lb

## 2012-06-27 DIAGNOSIS — R1013 Epigastric pain: Secondary | ICD-10-CM

## 2012-06-27 DIAGNOSIS — R634 Abnormal weight loss: Secondary | ICD-10-CM | POA: Diagnosis not present

## 2012-06-27 MED ORDER — MECLIZINE HCL 25 MG PO TABS: 25.0000 mg | ORAL_TABLET | Freq: Two times a day (BID) | ORAL | Status: DC | PRN

## 2012-06-27 MED ORDER — ONDANSETRON HCL 4 MG PO TABS
4.0000 mg | ORAL_TABLET | Freq: Three times a day (TID) | ORAL | Status: DC | PRN
Start: 2012-06-27 — End: 2013-02-06

## 2012-06-27 NOTE — Patient Instructions (Addendum)
I have sent a prescription for Zofran and Meclizine to the pharmacy to cover you while you are out of the [place].  Continue taking Nexium daily.   We will see you back in March 2014.   Have a wonderful trip!

## 2012-06-27 NOTE — Progress Notes (Signed)
Referring Provider: Eustaquio Boyden, MD Primary Care Physician:  Eustaquio Boyden, MD Primary Gastroenterologist: Dr. Jena Gauss   Chief Complaint  Patient presents with  . Follow-up    HPI:   78 year old pleasant female, returns today in f/u for weight loss and abdominal pain. EGD in interim from last visit (performed Nov 2013) with hiatal hernia, otherwise normal. CT angiogram without mesenteric ischemia, HIDA normal as noted below. She is down 3 lbs but feels much better overall.   Still has pain in the evening but nothing near like it was. Very mild. Tramadol each evening, which relieves the discomfort completely.  Appetite good. If eats too large a meal, bothers her. Eats 6 little small meals. Misses onion, garlic, lemon juice, tomatoes. Like a gnawing, deep feeling. Nausea.   CT angiogram Nov 2013 performed to assess for mesenteric ischemia. No significant stenosis of blood vessels. Subsequent HIDA notes normal EF with 98%. No reproduction of symptoms with Ensure ingestion.   She is traveling out of the country for [place], which she does every year.   Past Medical History  Diagnosis Date  . History of TIA (transient ischemic attack) remote  . Hypothyroidism   . History of rheumatic fever   . Osteoporosis     Thoracic compression fracture, on bisphosphonate and cal/vit D  . Atrial fibrillation     Paroxysmal  . Mixed hyperlipidemia   . Essential hypertension, benign   . Diabetes type 2, controlled     Diet controlled  . Pulmonary nodules     Stable bilateral on CT 01/2010, rec rpt yearly for 2 yrs, pt decided to stop f/u  . Mitral valve prolapse   . Aortic stenosis   . Anemia   . Arthritis   . History of skin cancer   . Anxiety   . Diverticulosis   . GERD (gastroesophageal reflux disease)   . Glaucoma   . Seasonal allergies   . History of pelvic fracture April 2013  . COPD (chronic obstructive pulmonary disease)   . Chronic gastritis 10/2009    Duodenitis on EGD   . Hiatal hernia 2011    Small  . Cancer   . Collagen vascular disease     Past Surgical History  Procedure Date  . Tonsillectomy   . Cesarean section     (and h/o 6 miscarriages)  . Breast lumpectomy 1983    LEFT, benign  . Patella fracture surgery 05/2006    LEFT surgically repaired with pins and wire  . Dexa 05/2010    T score -4.5 spine, -2.4 femur; does not want to repeat  . Cardiovascular stress test 2010    negative  . Cataract extraction 2012    RIGHT  . Carotid US 2011    mild plaque formation  . Colonoscopy 03/2010    int hemorrhoids, diverticulosis, no need to repeat (Dr. Kendell Bane)  . Esophagogastroduodenoscopy 10/2009    small HH, multiple antric ulcerations s/p biopsy, mild chronic gastritis/duodenitis  . Esophagogastroduodenoscopy 05/26/2012    RMR: small HH, o/w normal.     Current Outpatient Prescriptions  Medication Sig Dispense Refill  . acetaminophen (TYLENOL) 500 MG tablet Take 500 mg by mouth daily as needed. For pain      . Calcium-Magnesium-Vitamin D 500-250-200 MG-MG-UNIT TABS Take 1 tablet by mouth 2 (two) times daily.      . clonazePAM (KLONOPIN) 0.5 MG tablet Take 1 tablet (0.5 mg total) by mouth at bedtime as needed for anxiety. 3 month Rx  90  tablet  0  . docusate sodium (COLACE) 100 MG capsule Take 100 mg by mouth at bedtime as needed. ONE CAPSULE AS NEEDED, MAY TAKE SEVERAL TIMES PER WEEK. Constipation.      Marland Kitchen esomeprazole (NEXIUM) 40 MG capsule Take 1 capsule (40 mg total) by mouth daily.  90 capsule  3  . estradiol (ESTRACE) 0.1 MG/GM vaginal cream Place 2 g vaginally as directed.        . felodipine (PLENDIL) 5 MG 24 hr tablet TAKE 1 TABLET BY MOUTH EVERY DAY  90 tablet  0  . ibandronate (BONIVA) 3 MG/3ML SOLN injection Inject 3 mg into the vein every 3 (three) months. Last month sometime for last injection  3.6 mL    . latanoprost (XALATAN) 0.005 % ophthalmic solution Place 1 drop into both eyes at bedtime.       Marland Kitchen levothyroxine (SYNTHROID,  LEVOTHROID) 50 MCG tablet Take 1 tablet (50 mcg total) by mouth daily.  90 tablet  3  . Multiple Vitamin (MULTIVITAMIN) capsule Take 1 capsule by mouth every morning.       . Omega-3 Fatty Acids (FISH OIL CONCENTRATE) 1000 MG CAPS Take 2 capsules by mouth 2 (two) times daily.        . polyethylene glycol (MIRALAX / GLYCOLAX) packet Take 17 g by mouth 3 (three) times a week. As needed for constipation      . traMADol (ULTRAM) 50 MG tablet Take 1 tablet (50 mg total) by mouth every 6 (six) hours as needed. 3 mo Rx  120 tablet  1  . meclizine (ANTIVERT) 25 MG tablet Take 1 tablet (25 mg total) by mouth 2 (two) times daily as needed.  180 tablet  0  . ondansetron (ZOFRAN) 4 MG tablet Take 1 tablet (4 mg total) by mouth every 8 (eight) hours as needed for nausea.  90 tablet  1  . warfarin (COUMADIN) 2.5 MG tablet Take coumadin 2 tablets daily except 1 tablet on Mondays, Wednesdays and Fridays or as directed Pt going out of country x 3 monts  160 tablet  0  . [DISCONTINUED] Calcium Carbonate-Vitamin D (CALCIUM PLUS VITAMIN D PO) Take 2 tablets by mouth daily.          Allergies as of 06/27/2012 - Review Complete 06/27/2012  Allergen Reaction Noted  . Propoxyphene-acetaminophen Shortness Of Breath 01/09/2009  . Codeine Nausea And Vomiting   . Meperidine hcl Nausea And Vomiting 01/09/2009  . Morphine Nausea And Vomiting 01/09/2009  . Shellfish allergy  03/29/2012    Family History  Problem Relation Age of Onset  . Coronary artery disease Mother 15    MI deceased  . Colon cancer Father 75  . Arrhythmia Sister   . Stroke Paternal Grandmother   . Diabetes Neg Hx     History   Social History  . Marital Status: Married    Spouse Name: N/A    Number of Children: N/A  . Years of Education: N/A   Occupational History  . Retired     Former Charity fundraiser   Social History Main Topics  . Smoking status: Former Smoker -- 1.0 packs/day for 20 years    Types: Cigarettes    Quit date: 10/13/1998  .  Smokeless tobacco: Never Used  . Alcohol Use: No  . Drug Use: No  . Sexually Active: No   Other Topics Concern  . None   Social History Narrative   Caffeine: rareLives with husband, 5 dogsOccupation: retired, RNActivity: Diet:  Review of Systems: Negative unless mentioned in HPI  Physical Exam: BP 97/63  Pulse 68  Temp 97.2 F (36.2 C) (Oral)  Ht 5\' 6"  (1.676 m)  Wt 108 lb 12.8 oz (49.351 kg)  BMI 17.56 kg/m2 General:   Alert and oriented. No distress noted. Pleasant and cooperative.  Head:  Normocephalic and atraumatic. Eyes:  Conjuctiva clear without scleral icterus. Mouth:  Oral mucosa pink and moist. Good dentition. No lesions. Neck:  Supple, left cervical adenopathy, pt states is CHRONIC  Heart:  S1, S2 present irregular irregular rhythm (hx afib) Abdomen:  +BS, soft, non-tender and non-distended. No rebound or guarding. No HSM or masses noted. Msk:  Symmetrical without gross deformities. Normal posture. Extremities:  Without edema. Neurologic:  Alert and  oriented x4;  grossly normal neurologically. Skin:  Intact without significant lesions or rashes. Psych:  Alert and cooperative. Normal mood and affect.

## 2012-06-29 ENCOUNTER — Ambulatory Visit (INDEPENDENT_AMBULATORY_CARE_PROVIDER_SITE_OTHER): Payer: Medicare Other | Admitting: *Deleted

## 2012-06-29 DIAGNOSIS — I4891 Unspecified atrial fibrillation: Secondary | ICD-10-CM

## 2012-06-29 DIAGNOSIS — Z7901 Long term (current) use of anticoagulants: Secondary | ICD-10-CM | POA: Diagnosis not present

## 2012-06-29 DIAGNOSIS — G459 Transient cerebral ischemic attack, unspecified: Secondary | ICD-10-CM

## 2012-06-29 LAB — POCT INR: INR: 2.7

## 2012-06-29 MED ORDER — WARFARIN SODIUM 2.5 MG PO TABS: ORAL_TABLET | ORAL | Status: DC

## 2012-07-01 ENCOUNTER — Encounter: Payer: Self-pay | Admitting: Gastroenterology

## 2012-07-01 NOTE — Assessment & Plan Note (Signed)
EGD on file without evidence for etiology; CTA and HIDA negative. Pt notes significant improvement. Continue Nexium daily. Will hold off on BID dosing due to hx of osteoporosis. follow GERD diet. Likely dealing with NUD. Prescriptions sent to pharmacy for 90 day supply to include Nexium, Zofran as pt will be out of the country for several months. Notes just diagnosed with vertigo; Meclizine provided short-term, further refills through PCP.   Return March 2014.

## 2012-07-01 NOTE — Assessment & Plan Note (Signed)
No etiology to weight loss noted. Pt notes improvement in symptoms overall, but she has lost a few lbs since last visit. Return in March 2014 for assessment and weight check.

## 2012-07-04 NOTE — Progress Notes (Signed)
Faxed to PCP

## 2012-08-09 ENCOUNTER — Other Ambulatory Visit: Payer: Self-pay | Admitting: Family Medicine

## 2012-09-09 ENCOUNTER — Other Ambulatory Visit: Payer: Self-pay | Admitting: Cardiology

## 2012-09-29 ENCOUNTER — Ambulatory Visit (INDEPENDENT_AMBULATORY_CARE_PROVIDER_SITE_OTHER): Payer: Medicare Other | Admitting: *Deleted

## 2012-09-29 DIAGNOSIS — Z7901 Long term (current) use of anticoagulants: Secondary | ICD-10-CM

## 2012-10-11 ENCOUNTER — Telehealth: Payer: Self-pay | Admitting: *Deleted

## 2012-10-11 ENCOUNTER — Encounter (HOSPITAL_COMMUNITY)
Admission: RE | Admit: 2012-10-11 | Discharge: 2012-10-11 | Disposition: A | Discharge status: Home / Self Care | Payer: Medicare Other | Source: Ambulatory Visit | Attending: Family Medicine | Admitting: Family Medicine

## 2012-10-11 DIAGNOSIS — M810 Age-related osteoporosis without current pathological fracture: Secondary | ICD-10-CM | POA: Insufficient documentation

## 2012-10-11 MED ORDER — SODIUM CHLORIDE 0.9 % IV SOLN
INTRAVENOUS | Status: DC
  Administered 2012-10-11: 11:00:00 via INTRAVENOUS

## 2012-10-11 MED ORDER — IBANDRONATE SODIUM 3 MG/3ML IV SOLN
3.0000 mg | Freq: Once | INTRAVENOUS | Status: AC
  Administered 2012-10-11: 3 mg via INTRAVENOUS

## 2012-10-11 MED ORDER — IBANDRONATE SODIUM 3 MG/3ML IV SOLN
INTRAVENOUS | Status: AC
  Administered 2012-10-11: 3 mg via INTRAVENOUS
  Filled 2012-10-11: qty 3

## 2012-10-11 MED ORDER — IBANDRONATE SODIUM 3 MG/3ML IV SOLN
INTRAVENOUS | Status: AC
  Filled 2012-10-11: qty 3

## 2012-10-11 NOTE — Telephone Encounter (Signed)
plz give IV ibandronate 3mg  IV (boniva) Q46mo.  Lab Results  Component Value Date   CREATININE 0.65 06/06/2012  last vitamin D 51 (03/03/2012)

## 2012-10-11 NOTE — Telephone Encounter (Signed)
Pt. Needs orders written for Boniva injection. Her previous orders have expired. She is at the day hospital for infusion now and scheduled to receive it at 10 AM. Fax orders to [phone] attn Laverne. Call [phone] with questions

## 2012-10-11 NOTE — Telephone Encounter (Signed)
Orders faxed

## 2012-10-14 ENCOUNTER — Encounter: Payer: Self-pay | Admitting: Internal Medicine

## 2012-10-14 ENCOUNTER — Ambulatory Visit (INDEPENDENT_AMBULATORY_CARE_PROVIDER_SITE_OTHER): Payer: Medicare Other | Admitting: Internal Medicine

## 2012-10-14 VITALS — BP 126/76 | HR 76 | Temp 97.6°F | Ht 66.0 in | Wt 110.0 lb

## 2012-10-14 DIAGNOSIS — G8929 Other chronic pain: Secondary | ICD-10-CM | POA: Diagnosis not present

## 2012-10-14 DIAGNOSIS — R1011 Right upper quadrant pain: Secondary | ICD-10-CM

## 2012-10-14 NOTE — Patient Instructions (Addendum)
Stop Nexium entirely for 3 weeks ; then call me and let me know how you are doing   CC: PCP

## 2012-10-14 NOTE — Progress Notes (Signed)
Primary Care Physician:  Eustaquio Boyden, MD Primary Gastroenterologist:  Dr. Jena Gauss  Pre-Procedure History & Physical: HPI:  [name] is a 79 y.o. female here for followup of postprandial upper abdominal pain. She's gained 2 pounds since she was last seen. She does continue complain about aggravating postprandial more or less right upper quadrant abdominal pain which may last for an hour or so after she eats a meal if she eats smaller meals she is less likely to have this pain. Tramadol dose completely alleviate this pain when she takes it.  She's been on Protonix and now Nexium without any improvement in symptoms. Gallbladder imaging including HIDA with fatty meal all negative. CTA negative for mesenteric ischemia. EGD negative.  Past Medical History  Diagnosis Date  . History of TIA (transient ischemic attack) remote  . Hypothyroidism   . History of rheumatic fever   . Osteoporosis     Thoracic compression fracture, on bisphosphonate and cal/vit D  . Atrial fibrillation     Paroxysmal  . Mixed hyperlipidemia   . Essential hypertension, benign   . Diabetes type 2, controlled     Diet controlled  . Pulmonary nodules     Stable bilateral on CT 01/2010, rec rpt yearly for 2 yrs, pt decided to stop f/u  . Mitral valve prolapse   . Aortic stenosis   . Anemia   . Arthritis   . History of skin cancer   . Anxiety   . Diverticulosis   . GERD (gastroesophageal reflux disease)   . Glaucoma   . Seasonal allergies   . History of pelvic fracture April 2013  . COPD (chronic obstructive pulmonary disease)   . Chronic gastritis 10/2009    Duodenitis on EGD  . Hiatal hernia 2011    Small  . Cancer   . Collagen vascular disease     Past Surgical History  Procedure Laterality Date  . Tonsillectomy    . Cesarean section      (and h/o 6 miscarriages)  . Breast lumpectomy  1983    LEFT, benign  . Patella fracture surgery  05/2006    LEFT surgically repaired with pins and wire  .  Dexa  05/2010    T score -4.5 spine, -2.4 femur; does not want to repeat  . Cardiovascular stress test  2010    negative  . Cataract extraction  2012    RIGHT  . Carotid US  2011    mild plaque formation  . Colonoscopy  03/2010    int hemorrhoids, diverticulosis, no need to repeat (Dr. Kendell Bane)  . Esophagogastroduodenoscopy  10/2009    small HH, multiple antric ulcerations s/p biopsy, mild chronic gastritis/duodenitis  . Esophagogastroduodenoscopy  05/26/2012    RMR: small HH, o/w normal.     Prior to Admission medications   Medication Sig Start Date End Date Taking? Authorizing Provider  acetaminophen (TYLENOL) 500 MG tablet Take 500 mg by mouth daily as needed. For pain   Yes Historical Provider, MD  Calcium-Magnesium-Vitamin D 500-250-200 MG-MG-UNIT TABS Take 1 tablet by mouth 2 (two) times daily.   Yes Historical Provider, MD  clonazePAM (KLONOPIN) 0.5 MG tablet Take 1 tablet (0.5 mg total) by mouth at bedtime as needed for anxiety. 3 month Rx 06/10/12  Yes Eustaquio Boyden, MD  docusate sodium (COLACE) 100 MG capsule Take 100 mg by mouth at bedtime as needed. ONE CAPSULE AS NEEDED, MAY TAKE SEVERAL TIMES PER WEEK. Constipation.   Yes Historical Provider, MD  estradiol (ESTRACE) 0.1 MG/GM vaginal cream Place 2 g vaginally as directed.     Yes Historical Provider, MD  felodipine (PLENDIL) 5 MG 24 hr tablet TAKE 1 TABLET BY MOUTH EVERY DAY 09/09/12  Yes Kathlen Brunswick, MD  ibandronate (BONIVA) 3 MG/3ML SOLN injection Inject 3 mg into the vein every 3 (three) months. Last month sometime for last injection 04/05/12  Yes Eustaquio Boyden, MD  latanoprost (XALATAN) 0.005 % ophthalmic solution Place 1 drop into both eyes at bedtime.    Yes Historical Provider, MD  levothyroxine (SYNTHROID, LEVOTHROID) 50 MCG tablet Take 1 tablet (50 mcg total) by mouth daily. 03/29/12  Yes Eustaquio Boyden, MD  meclizine (ANTIVERT) 25 MG tablet Take 1 tablet (25 mg total) by mouth 2 (two) times daily as needed.  06/27/12  Yes Nira Retort, NP  Multiple Vitamin (MULTIVITAMIN) capsule Take 1 capsule by mouth every morning.    Yes Historical Provider, MD  NEXIUM 40 MG capsule TAKE 1 CAPSULE DAILY 08/09/12  Yes Eustaquio Boyden, MD  Omega-3 Fatty Acids (FISH OIL CONCENTRATE) 1000 MG CAPS Take 2 capsules by mouth 2 (two) times daily.     Yes Historical Provider, MD  ondansetron (ZOFRAN) 4 MG tablet Take 1 tablet (4 mg total) by mouth every 8 (eight) hours as needed for nausea. 06/27/12  Yes Nira Retort, NP  polyethylene glycol (MIRALAX / GLYCOLAX) packet Take 17 g by mouth 3 (three) times a week. As needed for constipation   Yes Historical Provider, MD  traMADol (ULTRAM) 50 MG tablet Take 1 tablet (50 mg total) by mouth every 6 (six) hours as needed. 3 mo Rx 06/10/12  Yes Eustaquio Boyden, MD  warfarin (COUMADIN) 2.5 MG tablet Take coumadin 2 tablets daily except 1 tablet on Mondays, Wednesdays and Fridays or as directed Pt going out of country x 3 monts 06/29/12  Yes [name], MD    Allergies as of 10/14/2012 - Review Complete 10/14/2012  Allergen Reaction Noted  . Propoxyphene-acetaminophen Shortness Of Breath 01/09/2009  . Codeine Nausea And Vomiting   . Meperidine hcl Nausea And Vomiting 01/09/2009  . Morphine Nausea And Vomiting 01/09/2009  . Shellfish allergy  03/29/2012    Family History  Problem Relation Age of Onset  . Coronary artery disease Mother 69    MI deceased  . Colon cancer Father 63  . Arrhythmia Sister   . Stroke Paternal Grandmother   . Diabetes Neg Hx     History   Social History  . Marital Status: Married    Spouse Name: N/A    Number of Children: N/A  . Years of Education: N/A   Occupational History  . Retired     Former Charity fundraiser   Social History Main Topics  . Smoking status: Former Smoker -- 1.00 packs/day for 20 years    Types: Cigarettes    Quit date: 10/13/1998  . Smokeless tobacco: Never Used  . Alcohol Use: No  . Drug Use: No  . Sexually Active: No    Other Topics Concern  . Not on file   Social History Narrative   Caffeine: rare   Lives with husband, 5 dogs   Occupation: retired, Charity fundraiser   Activity:    Diet:     Review of Systems: See HPI, otherwise negative ROS  Physical Exam: BP 126/76  Pulse 76  Temp(Src) 97.6 F (36.4 C) (Oral)  Ht 5\' 6"  (1.676 m)  Wt 110 lb (49.896 kg)  BMI 17.76 kg/m2 General:  Alert,  pleasant, thin, well-nourished, pleasant and cooperative in NAD Skin:  Intact without significant lesions or rashes. Eyes:  Sclera clear, no icterus.   Conjunctiva pink. Ears:  Normal auditory acuity. Nose:  No deformity, discharge,  or lesions. Mouth:  No deformity or lesions. Neck:  Supple; no masses or thyromegaly. No significant cervical adenopathy. Lungs:  Clear throughout to auscultation.   No wheezes, crackles, or rhonchi. No acute distress. Heart:  Regular rate and rhythm; no murmurs, clicks, rubs,  or gallops. Abdomen: Non-distended, normal bowel sounds.  No bruits. Some tenderness to deep palpation right upper quadrant. No mass or organomegaly.   Pulses:  Normal pulses noted. Extremities:  Without clubbing or edema.  Impression/Plan:  Very pleasant 79 year old lady with chronic intermittent postprandial upper abdominal pain which seems to be localizing to the right upper quadrant these days. Based on objective studies, gallbladder appears normal, however, clinically I am suspicious that her symptoms may be emanating from her gallbladder. Less likely, she is having abdominal pain related to PPI therapy. We've ruled out a myriad of other diagnostic possibilities with the above-mentioned testing.  Recommendations:  Stop Nexium entirely for 3 weeks; patient is to call me in 3 weeks to let me know how she is doing off of acid suppression therapy. If no change, I recommend we entertain the possibility of seeing a general surgeon electively for consideration of cholecystectomy as a diagnostic maneuver.

## 2012-10-17 NOTE — Addendum Note (Signed)
Addended by: Ervin Knack A on: 10/17/2012 12:45 PM   Modules accepted: Level of Service

## 2012-10-18 ENCOUNTER — Ambulatory Visit (INDEPENDENT_AMBULATORY_CARE_PROVIDER_SITE_OTHER): Payer: Medicare Other | Admitting: Family Medicine

## 2012-10-18 ENCOUNTER — Encounter: Payer: Self-pay | Admitting: Family Medicine

## 2012-10-18 VITALS — BP 120/68 | HR 92 | Temp 98.3°F | Wt 109.0 lb

## 2012-10-18 DIAGNOSIS — E119 Type 2 diabetes mellitus without complications: Secondary | ICD-10-CM | POA: Diagnosis not present

## 2012-10-18 DIAGNOSIS — I10 Essential (primary) hypertension: Secondary | ICD-10-CM

## 2012-10-18 DIAGNOSIS — K5900 Constipation, unspecified: Secondary | ICD-10-CM

## 2012-10-18 DIAGNOSIS — M810 Age-related osteoporosis without current pathological fracture: Secondary | ICD-10-CM | POA: Diagnosis not present

## 2012-10-18 DIAGNOSIS — E039 Hypothyroidism, unspecified: Secondary | ICD-10-CM

## 2012-10-18 DIAGNOSIS — I4891 Unspecified atrial fibrillation: Secondary | ICD-10-CM

## 2012-10-18 LAB — BASIC METABOLIC PANEL
BUN: 19 mg/dL (ref 6–23)
CO2: 32 mEq/L (ref 19–32)
Calcium: 10.1 mg/dL (ref 8.4–10.5)
Creatinine, Ser: 1 mg/dL (ref 0.4–1.2)
GFR: 57.31 mL/min — ABNORMAL LOW (ref >60.00)
Glucose, Bld: 150 mg/dL — ABNORMAL HIGH (ref 70–99)
Sodium: 139 mEq/L (ref 135–145)

## 2012-10-18 MED ORDER — CLONAZEPAM 0.5 MG PO TABS: 0.5000 mg | ORAL_TABLET | Freq: Every evening | ORAL | Status: DC | PRN

## 2012-10-18 NOTE — Patient Instructions (Addendum)
Good to see you today ,call us with quesitons. Blood work today. Return as needed or in 5-6 months for medicare wellness visit no changes today.

## 2012-10-18 NOTE — Assessment & Plan Note (Signed)
Discussed this. Continue colace alternating with miralax. Suggested she try soluble fiber supplement, metamucil samples provided.  If worsen pain, knows to stop. Did not respond well to amitiza - diarrhea.

## 2012-10-18 NOTE — Assessment & Plan Note (Signed)
Compliant with IV bisphosphonate.  Continue Q3 months. No hip or tooth pain.  No upcoming dental procedure Vit D not due yet for recheck.

## 2012-10-18 NOTE — Assessment & Plan Note (Signed)
Check TSH today

## 2012-10-18 NOTE — Progress Notes (Signed)
  Subjective:    Patient ID: [name], female    DOB: 1926/01/14, 79 y.o.   MRN: [id number]  HPI CC: 5 mo f/u  [name] presents today after her yearly 6 mo hiatus in [place].  Returned March 10th.  Recent note from Dr. Jena Gauss reviewed - persistent abd discomfort that seems to localize to RUQ, thought possibly PPI related so stopped nexium.  If not improved, plan may be to refer to surgery for eval for possible gallbladder etiology/elective cholecystectomy.  Has concerns about removing healthy gallbladder.  Has stopped nexium, notices increasing dyspepsia.  Constipation - does strain.  Regularly needs either miralax or colace.  Drinks at least 6 glasses of water daily.  Low fiber diet.  Has not tried soluble fiber supplements recently.  Routine - BM after breakfast. Amitiza caused diarrhea.  Carafate previously stopped.  Osteoporosis - compliant with cal/vit d and boniva IV Q3 months.  No hip pain.  No upcoming dental procedures.  Wt Readings from Last 3 Encounters:  10/18/12 109 lb (49.442 kg)  10/14/12 110 lb (49.896 kg)  10/11/12 108 lb (48.988 kg)     Past Medical History  Diagnosis Date  . History of TIA (transient ischemic attack) remote  . Hypothyroidism   . History of rheumatic fever   . Osteoporosis     Thoracic compression fracture, on bisphosphonate and cal/vit D  . Atrial fibrillation     Paroxysmal  . Mixed hyperlipidemia   . Essential hypertension, benign   . Diabetes type 2, controlled     Diet controlled  . Pulmonary nodules     Stable bilateral on CT 01/2010, rec rpt yearly for 2 yrs, pt decided to stop f/u  . Mitral valve prolapse   . Aortic stenosis   . Anemia   . Arthritis   . History of skin cancer   . Anxiety   . Diverticulosis   . GERD (gastroesophageal reflux disease)   . Glaucoma   . Seasonal allergies   . History of pelvic fracture April 2013  . COPD (chronic obstructive pulmonary disease)   . Chronic gastritis 10/2009   Duodenitis on EGD  . Hiatal hernia 2011    Small  . Cancer   . Collagen vascular disease      Review of Systems Per HPI    Objective:   Physical Exam  Nursing note and vitals reviewed. Constitutional: She appears well-developed and well-nourished. No distress.  Very thin  HENT:  Head: Normocephalic and atraumatic.  Mouth/Throat: Oropharynx is clear and moist. No oropharyngeal exudate.  Eyes: Conjunctivae and EOM are normal. Pupils are equal, round, and reactive to light.  Neck: Normal range of motion. Neck supple. Carotid bruit is not present.  Cardiovascular: Normal rate, normal heart sounds and intact distal pulses.  An irregularly irregular rhythm present.  No murmur heard. Pulmonary/Chest: Effort normal and breath sounds normal. No respiratory distress. She has no wheezes. She has no rales.  Abdominal: Soft. Bowel sounds are normal. She exhibits no distension and no mass. There is no tenderness. There is no rebound and no guarding.  Musculoskeletal: She exhibits no edema.  Lymphadenopathy:    She has no cervical adenopathy.  Skin: Skin is warm and dry. No rash noted.  Psychiatric: She has a normal mood and affect.       Assessment & Plan:

## 2012-10-18 NOTE — Assessment & Plan Note (Signed)
Diet controlled Check A1c today 

## 2012-10-18 NOTE — Assessment & Plan Note (Signed)
Stable on coumadin, rate controlled.

## 2012-10-19 ENCOUNTER — Encounter: Payer: Self-pay | Admitting: *Deleted

## 2012-10-20 ENCOUNTER — Ambulatory Visit (INDEPENDENT_AMBULATORY_CARE_PROVIDER_SITE_OTHER): Payer: Medicare Other | Admitting: *Deleted

## 2012-10-20 DIAGNOSIS — I4891 Unspecified atrial fibrillation: Secondary | ICD-10-CM

## 2012-10-20 DIAGNOSIS — G459 Transient cerebral ischemic attack, unspecified: Secondary | ICD-10-CM | POA: Diagnosis not present

## 2012-10-20 DIAGNOSIS — Z7901 Long term (current) use of anticoagulants: Secondary | ICD-10-CM

## 2012-11-03 ENCOUNTER — Telehealth: Payer: Self-pay

## 2012-11-03 NOTE — Telephone Encounter (Signed)
Referral has been faxed to Dr. Cherylann Ratel and the office is aware that patient wants to wait until after May 17th

## 2012-11-03 NOTE — Telephone Encounter (Signed)
Pt called- she has been off the nexium x 3 weeks, she stated she was not any better, she has noticed an increase in the frequency that she has the pain. She said she was not any worse than she was before. She said it was ok to refer her to see a Careers adviser. She is ok with seeing Dr. Lovell Sheehan. She doesn't want an appointment with them until after May 16th because she is going on vacation.   Dr. Jena Gauss do you want pt to restart Nexium? (pt is aware that [facility] is out of the office until 11/07/12)

## 2012-11-17 ENCOUNTER — Ambulatory Visit (INDEPENDENT_AMBULATORY_CARE_PROVIDER_SITE_OTHER): Payer: Medicare Other | Admitting: *Deleted

## 2012-11-17 DIAGNOSIS — Z7901 Long term (current) use of anticoagulants: Secondary | ICD-10-CM | POA: Diagnosis not present

## 2012-11-17 DIAGNOSIS — G459 Transient cerebral ischemic attack, unspecified: Secondary | ICD-10-CM | POA: Diagnosis not present

## 2012-11-17 DIAGNOSIS — I4891 Unspecified atrial fibrillation: Secondary | ICD-10-CM

## 2012-11-24 ENCOUNTER — Other Ambulatory Visit: Payer: Self-pay | Admitting: *Deleted

## 2012-11-24 MED ORDER — LEVOTHYROXINE SODIUM 50 MCG PO TABS: 50.0000 ug | ORAL_TABLET | Freq: Every day | ORAL | Status: DC

## 2012-12-07 ENCOUNTER — Encounter: Payer: Self-pay | Admitting: Cardiology

## 2012-12-07 ENCOUNTER — Ambulatory Visit (INDEPENDENT_AMBULATORY_CARE_PROVIDER_SITE_OTHER): Payer: Medicare Other | Admitting: *Deleted

## 2012-12-07 ENCOUNTER — Other Ambulatory Visit: Payer: Self-pay | Admitting: Cardiology

## 2012-12-07 ENCOUNTER — Ambulatory Visit (INDEPENDENT_AMBULATORY_CARE_PROVIDER_SITE_OTHER): Payer: Medicare Other | Admitting: Cardiology

## 2012-12-07 ENCOUNTER — Ambulatory Visit: Payer: Medicare Other | Admitting: Cardiology

## 2012-12-07 VITALS — BP 120/74 | HR 85 | Ht 65.5 in | Wt 112.0 lb

## 2012-12-07 DIAGNOSIS — Z7901 Long term (current) use of anticoagulants: Secondary | ICD-10-CM | POA: Diagnosis not present

## 2012-12-07 DIAGNOSIS — I10 Essential (primary) hypertension: Secondary | ICD-10-CM | POA: Diagnosis not present

## 2012-12-07 DIAGNOSIS — G459 Transient cerebral ischemic attack, unspecified: Secondary | ICD-10-CM

## 2012-12-07 DIAGNOSIS — I4891 Unspecified atrial fibrillation: Secondary | ICD-10-CM

## 2012-12-07 NOTE — Assessment & Plan Note (Signed)
Blood pressure well-controlled today. 

## 2012-12-07 NOTE — Patient Instructions (Addendum)
Your physician recommends that you schedule a follow-up appointment in: 6 MONTHS 

## 2012-12-07 NOTE — Progress Notes (Signed)
Clinical Summary [name] is an 79 y.o.female last seen in November 2013. Ensure more proportionate with progressive palpitations, stable dyspnea on exertion. Continues to struggle with chronic recurrent abdominal pain. She states that she is undergoing evaluation for possible gallbladder disease.  Recent lab work from April reviewed finding potassium 4.3, BUN 19, creatinine 1.0, hemoglobin A1c 6.5%, TSH 2.8.  ECG today shows atrial fibrillation at 85 BPM, decreased R wave progression.  She reports no bleeding problems on Coumadin with therapeutic INR today.  Allergies  Allergen Reactions  . Propoxyphene-Acetaminophen Shortness Of Breath  . Codeine Nausea And Vomiting  . Meperidine Hcl Nausea And Vomiting  . Morphine Nausea And Vomiting  . Shellfish Allergy     Current Outpatient Prescriptions  Medication Sig Dispense Refill  . polyethylene glycol (MIRALAX / GLYCOLAX) packet Take 17 g by mouth 3 (three) times a week. As needed for constipation      . traMADol (ULTRAM) 50 MG tablet Take 50 mg by mouth every 6 (six) hours as needed.      . warfarin (COUMADIN) 2.5 MG tablet Take 2.5 mg by mouth as directed.      Marland Kitchen acetaminophen (TYLENOL) 500 MG tablet Take 500 mg by mouth daily as needed. For pain      . Calcium-Magnesium-Vitamin D 500-250-200 MG-MG-UNIT TABS Take 1 tablet by mouth 2 (two) times daily.      . clonazePAM (KLONOPIN) 0.5 MG tablet Take 1 tablet (0.5 mg total) by mouth at bedtime as needed for anxiety.  30 tablet  1  . docusate sodium (COLACE) 100 MG capsule Take 100 mg by mouth at bedtime as needed.       Marland Kitchen estradiol (ESTRACE) 0.1 MG/GM vaginal cream Place 2 g vaginally as directed.        . felodipine (PLENDIL) 5 MG 24 hr tablet TAKE 1 TABLET BY MOUTH EVERY DAY  90 tablet  0  . ibandronate (BONIVA) 3 MG/3ML SOLN injection Inject 3 mg into the vein every 3 (three) months.   3.6 mL    . latanoprost (XALATAN) 0.005 % ophthalmic solution Place 1 drop into both eyes at  bedtime.       Marland Kitchen levothyroxine (SYNTHROID, LEVOTHROID) 50 MCG tablet Take 1 tablet (50 mcg total) by mouth daily.  90 tablet  1  . Multiple Vitamin (MULTIVITAMIN) capsule Take 1 capsule by mouth every morning.       Marland Kitchen NEXIUM 40 MG capsule TAKE 1 CAPSULE DAILY  90 capsule  3  . Omega-3 Fatty Acids (FISH OIL CONCENTRATE) 1000 MG CAPS Take 2 capsules by mouth 2 (two) times daily.        . ondansetron (ZOFRAN) 4 MG tablet Take 1 tablet (4 mg total) by mouth every 8 (eight) hours as needed for nausea.  90 tablet  1  . [DISCONTINUED] Calcium Carbonate-Vitamin D (CALCIUM PLUS VITAMIN D PO) Take 2 tablets by mouth daily.         No current facility-administered medications for this visit.    Past Medical History  Diagnosis Date  . History of TIA (transient ischemic attack) remote  . Hypothyroidism   . History of rheumatic fever   . Osteoporosis     Thoracic compression fracture, on bisphosphonate and cal/vit D  . Atrial fibrillation     Paroxysmal  . Mixed hyperlipidemia   . Essential hypertension, benign   . Diabetes type 2, controlled     Diet controlled  . Pulmonary nodules  Stable bilateral on CT 01/2010, rec rpt yearly for 2 yrs, pt decided to stop f/u  . Mitral valve prolapse   . Aortic stenosis   . Anemia   . Arthritis   . History of skin cancer   . Anxiety   . Diverticulosis   . GERD (gastroesophageal reflux disease)   . Glaucoma   . Seasonal allergies   . History of pelvic fracture April 2013  . COPD (chronic obstructive pulmonary disease)   . Chronic gastritis 10/2009    Duodenitis on EGD  . Hiatal hernia 2011    Small  . Collagen vascular disease     Social History [name] reports that she quit smoking about 14 years ago. Her smoking use included Cigarettes. She has a 20 pack-year smoking history. She has never used smokeless tobacco. [name] reports that she does not drink alcohol.  Review of Systems As outlined above.  Physical  Examination Filed Vitals:   12/07/12 1342  BP: 120/74  Pulse: 85   Filed Weights   12/07/12 1342  Weight: 112 lb (50.803 kg)    Thin elderly woman in no acute distress.  HEENT: Conjunctiva and lids normal, oropharynx clear.  Neck: Supple no elevated venous pressure or carotid bruits.  Cardiac: Irregularly irregular, no pathologic systolic murmur.  Lungs: Clear to auscultation. Nonlabored breathing at rest.  Extremities: No pitting edema.    Problem List and Plan   Atrial fibrillation Persistent, continue strategy of heart rate control and anticoagulation.  ESSENTIAL HYPERTENSION, BENIGN Blood pressure well controlled today.    [name], M.D., F.A.C.C.

## 2012-12-07 NOTE — Assessment & Plan Note (Signed)
Persistent, continue strategy of heart rate control and anticoagulation. 

## 2012-12-08 DIAGNOSIS — R1013 Epigastric pain: Secondary | ICD-10-CM | POA: Diagnosis not present

## 2012-12-26 ENCOUNTER — Other Ambulatory Visit: Payer: Self-pay | Admitting: Family Medicine

## 2012-12-26 NOTE — Telephone Encounter (Signed)
plz phone in. 

## 2012-12-26 NOTE — Telephone Encounter (Signed)
Rx called in as directed.   

## 2012-12-30 ENCOUNTER — Other Ambulatory Visit: Payer: Self-pay | Admitting: Cardiology

## 2012-12-30 NOTE — Telephone Encounter (Signed)
Medication sent via escribe.  

## 2013-01-04 ENCOUNTER — Ambulatory Visit (INDEPENDENT_AMBULATORY_CARE_PROVIDER_SITE_OTHER): Payer: Medicare Other | Admitting: *Deleted

## 2013-01-04 DIAGNOSIS — G459 Transient cerebral ischemic attack, unspecified: Secondary | ICD-10-CM

## 2013-01-04 DIAGNOSIS — I4891 Unspecified atrial fibrillation: Secondary | ICD-10-CM

## 2013-01-04 DIAGNOSIS — Z7901 Long term (current) use of anticoagulants: Secondary | ICD-10-CM | POA: Diagnosis not present

## 2013-01-09 ENCOUNTER — Other Ambulatory Visit (HOSPITAL_COMMUNITY): Payer: Self-pay | Admitting: *Deleted

## 2013-01-10 ENCOUNTER — Encounter (HOSPITAL_COMMUNITY)
Admission: RE | Admit: 2013-01-10 | Discharge: 2013-01-10 | Disposition: A | Discharge status: Home / Self Care | Payer: Medicare Other | Source: Ambulatory Visit | Attending: Family Medicine | Admitting: Family Medicine

## 2013-01-10 DIAGNOSIS — M810 Age-related osteoporosis without current pathological fracture: Secondary | ICD-10-CM | POA: Insufficient documentation

## 2013-01-10 DIAGNOSIS — H26499 Other secondary cataract, unspecified eye: Secondary | ICD-10-CM | POA: Diagnosis not present

## 2013-01-10 DIAGNOSIS — I10 Essential (primary) hypertension: Secondary | ICD-10-CM | POA: Diagnosis not present

## 2013-01-10 DIAGNOSIS — I4891 Unspecified atrial fibrillation: Secondary | ICD-10-CM | POA: Diagnosis not present

## 2013-01-10 DIAGNOSIS — E039 Hypothyroidism, unspecified: Secondary | ICD-10-CM | POA: Insufficient documentation

## 2013-01-10 DIAGNOSIS — F411 Generalized anxiety disorder: Secondary | ICD-10-CM | POA: Insufficient documentation

## 2013-01-10 DIAGNOSIS — E119 Type 2 diabetes mellitus without complications: Secondary | ICD-10-CM | POA: Diagnosis not present

## 2013-01-10 DIAGNOSIS — R1319 Other dysphagia: Secondary | ICD-10-CM | POA: Diagnosis not present

## 2013-01-10 DIAGNOSIS — H4011X0 Primary open-angle glaucoma, stage unspecified: Secondary | ICD-10-CM | POA: Diagnosis not present

## 2013-01-10 DIAGNOSIS — Z8673 Personal history of transient ischemic attack (TIA), and cerebral infarction without residual deficits: Secondary | ICD-10-CM | POA: Diagnosis not present

## 2013-01-10 DIAGNOSIS — I059 Rheumatic mitral valve disease, unspecified: Secondary | ICD-10-CM | POA: Diagnosis not present

## 2013-01-10 DIAGNOSIS — E785 Hyperlipidemia, unspecified: Secondary | ICD-10-CM | POA: Insufficient documentation

## 2013-01-10 DIAGNOSIS — R918 Other nonspecific abnormal finding of lung field: Secondary | ICD-10-CM | POA: Insufficient documentation

## 2013-01-10 DIAGNOSIS — K219 Gastro-esophageal reflux disease without esophagitis: Secondary | ICD-10-CM | POA: Diagnosis not present

## 2013-01-10 DIAGNOSIS — H2510 Age-related nuclear cataract, unspecified eye: Secondary | ICD-10-CM | POA: Diagnosis not present

## 2013-01-10 MED ORDER — SODIUM CHLORIDE 0.9 % IV SOLN
Freq: Once | INTRAVENOUS | Status: AC
  Administered 2013-01-10: 250 mL via INTRAVENOUS

## 2013-01-10 MED ORDER — IBANDRONATE SODIUM 3 MG/3ML IV SOLN
3.0000 mg | Freq: Once | INTRAVENOUS | Status: AC
  Administered 2013-01-10: 3 mg via INTRAVENOUS

## 2013-01-13 ENCOUNTER — Other Ambulatory Visit: Payer: Self-pay | Admitting: Family Medicine

## 2013-01-13 NOTE — Telephone Encounter (Signed)
Ok to refill in Dr. Timoteo Expose absence? Last filled 06/10/12.

## 2013-01-15 NOTE — Telephone Encounter (Signed)
Sent!

## 2013-01-26 ENCOUNTER — Other Ambulatory Visit: Payer: Self-pay

## 2013-02-01 ENCOUNTER — Encounter: Payer: Self-pay | Admitting: Radiology

## 2013-02-01 ENCOUNTER — Ambulatory Visit (INDEPENDENT_AMBULATORY_CARE_PROVIDER_SITE_OTHER): Payer: Medicare Other | Admitting: *Deleted

## 2013-02-01 DIAGNOSIS — G459 Transient cerebral ischemic attack, unspecified: Secondary | ICD-10-CM

## 2013-02-01 DIAGNOSIS — I4891 Unspecified atrial fibrillation: Secondary | ICD-10-CM | POA: Diagnosis not present

## 2013-02-01 DIAGNOSIS — Z7901 Long term (current) use of anticoagulants: Secondary | ICD-10-CM

## 2013-02-01 LAB — POCT INR: INR: 2.9

## 2013-02-02 ENCOUNTER — Ambulatory Visit (INDEPENDENT_AMBULATORY_CARE_PROVIDER_SITE_OTHER): Payer: Medicare Other | Admitting: Family Medicine

## 2013-02-02 ENCOUNTER — Encounter: Payer: Self-pay | Admitting: Family Medicine

## 2013-02-02 VITALS — BP 124/82 | HR 76 | Temp 97.7°F | Wt 110.8 lb

## 2013-02-02 DIAGNOSIS — M810 Age-related osteoporosis without current pathological fracture: Secondary | ICD-10-CM | POA: Diagnosis not present

## 2013-02-02 DIAGNOSIS — R1013 Epigastric pain: Secondary | ICD-10-CM

## 2013-02-02 NOTE — Patient Instructions (Signed)
Let's stop nexium. After September 24th will be next boniva infusion. Call Dr. Leticia Penna surgeon to discuss cholecystectomy at [facility]. Good to see you today.

## 2013-02-02 NOTE — Assessment & Plan Note (Signed)
Has never noticed any improvement on PPI - I suggested she stop this medication. Planning on calling Dr. Illene Regulus office to schedule cholecystectomy.

## 2013-02-02 NOTE — Progress Notes (Signed)
  Subjective:    Patient ID: [name], female    DOB: [birth year], 79 y.o.   MRN: [id number]  HPI CC: discuss gallstone surgery  Saw surgeon Dr. Leticia Penna in [place] at [facility].  Has decided to undergo diagnostic cholecystectomy.  Will call to schedule  Wonders if should continue or stop nexium.  Has not noticed any difference whether on or off PPI.  Last Boniva infusion was 01/10/2013   Past Medical History  Diagnosis Date  . History of TIA (transient ischemic attack) remote  . Hypothyroidism   . History of rheumatic fever   . Osteoporosis     Thoracic compression fracture, on bisphosphonate and cal/vit D  . Atrial fibrillation     Paroxysmal  . Mixed hyperlipidemia   . Essential hypertension, benign   . Diabetes type 2, controlled     Diet controlled  . Pulmonary nodules     Stable bilateral on CT 01/2010, rec rpt yearly for 2 yrs, pt decided to stop f/u  . Mitral valve prolapse   . Aortic stenosis   . Anemia   . Arthritis   . History of skin cancer   . Anxiety   . Diverticulosis   . GERD (gastroesophageal reflux disease)   . Glaucoma   . Seasonal allergies   . History of pelvic fracture April 2013  . COPD (chronic obstructive pulmonary disease)   . Chronic gastritis 10/2009    Duodenitis on EGD  . Hiatal hernia 2011    Small  . Collagen vascular disease      Review of Systems Per HPI    Objective:   Physical Exam  Nursing note and vitals reviewed. Constitutional: She appears well-developed and well-nourished. No distress.       Assessment & Plan:

## 2013-02-02 NOTE — Assessment & Plan Note (Signed)
We will need to schedule next IV boniva infusion after 04/12/2013.  I will route note to Kim to set this up. Vit D 51 (02/2012).

## 2013-02-03 ENCOUNTER — Telehealth: Payer: Self-pay | Admitting: *Deleted

## 2013-02-03 NOTE — Telephone Encounter (Signed)
Should stop for 5 days prior to surgery.

## 2013-02-03 NOTE — Telephone Encounter (Signed)
Please advise 

## 2013-02-03 NOTE — Telephone Encounter (Signed)
Called pt advised to stop coumadin 5 days prior to surgery. Pt understood.

## 2013-02-03 NOTE — Telephone Encounter (Signed)
Pt is scheduled for surgery on 02/15/13 and needs to know when to stop her coumadin

## 2013-02-06 ENCOUNTER — Encounter (HOSPITAL_COMMUNITY): Payer: Self-pay | Admitting: Pharmacy Technician

## 2013-02-08 ENCOUNTER — Other Ambulatory Visit: Payer: Self-pay | Admitting: Family Medicine

## 2013-02-08 DIAGNOSIS — M810 Age-related osteoporosis without current pathological fracture: Secondary | ICD-10-CM

## 2013-02-10 MED ORDER — IBANDRONATE SODIUM 3 MG/3ML IV SOLN: 3.0000 mg | INTRAVENOUS | Status: DC

## 2013-02-13 ENCOUNTER — Encounter (HOSPITAL_COMMUNITY)
Admission: RE | Admit: 2013-02-13 | Discharge: 2013-02-13 | Disposition: A | Discharge status: Home / Self Care | Payer: Medicare Other | Source: Ambulatory Visit | Attending: General Surgery | Admitting: General Surgery

## 2013-02-13 ENCOUNTER — Encounter (HOSPITAL_COMMUNITY): Payer: Self-pay

## 2013-02-13 DIAGNOSIS — Z538 Procedure and treatment not carried out for other reasons: Secondary | ICD-10-CM | POA: Insufficient documentation

## 2013-02-13 DIAGNOSIS — K8020 Calculus of gallbladder without cholecystitis without obstruction: Secondary | ICD-10-CM | POA: Insufficient documentation

## 2013-02-13 DIAGNOSIS — Z01812 Encounter for preprocedural laboratory examination: Secondary | ICD-10-CM | POA: Insufficient documentation

## 2013-02-13 LAB — CBC
Hemoglobin: 13.4 g/dL (ref 12.0–15.0)
MCHC: 34.8 g/dL (ref 30.0–36.0)
RDW: 13.3 % (ref 11.5–15.5)

## 2013-02-13 LAB — BASIC METABOLIC PANEL
GFR calc Af Amer: 70 mL/min — ABNORMAL LOW (ref >90)
GFR calc non Af Amer: 61 mL/min — ABNORMAL LOW (ref >90)
Potassium: 4.4 mEq/L (ref 3.5–5.1)
Sodium: 139 mEq/L (ref 135–145)

## 2013-02-13 NOTE — Patient Instructions (Addendum)
[name]  02/13/2013   Your procedure is scheduled on:  02/15/2013  Report to [facility] at  615  AM.  Call this number if you have problems the morning of surgery: [phone]   Remember:   Do not eat food or drink liquids after midnight.   Take these medicines the morning of surgery with A SIP OF WATER: klonopin, levothyroxine, tramdol, felodipine  Do not wear jewelry, make-up or nail polish.  Do not wear lotions, powders, or perfumes.   Do not shave 48 hours prior to surgery. Men may shave face and neck.  Do not bring valuables to the hospital.  [facility] is not responsible for any belongings or valuables.  Contacts, dentures or bridgework may not be worn into surgery.  Leave suitcase in the car. After surgery it may be brought to your room.  For patients admitted to the hospital, checkout time is 11:00 AM the day of discharge.   Patients discharged the day of surgery will not be allowed to drive home.  Name and phone number of your driver: family  Special Instructions: Shower using CHG 2 nights before surgery and the night before surgery.  If you shower the day of surgery use CHG.  Use special wash - you have one bottle of CHG for all showers.  You should use approximately 1/3 of the bottle for each shower.   Please read over the following fact sheets that you were given: Pain Booklet, Coughing and Deep Breathing, Surgical Site Infection Prevention, Anesthesia Post-op Instructions and Care and Recovery After Surgery Laparoscopic Cholecystectomy Laparoscopic cholecystectomy is surgery to remove the gallbladder. The gallbladder is located slightly to the right of center in the abdomen, behind the liver. It is a concentrating and storage sac for the bile produced in the liver. Bile aids in the digestion and absorption of fats. Gallbladder disease (cholecystitis) is an inflammation of your gallbladder. This condition is usually caused by a buildup of gallstones (cholelithiasis)  in your gallbladder. Gallstones can block the flow of bile, resulting in inflammation and pain. In severe cases, emergency surgery may be required. When emergency surgery is not required, you will have time to prepare for the procedure. Laparoscopic surgery is an alternative to open surgery. Laparoscopic surgery usually has a shorter recovery time. Your common bile duct may also need to be examined and explored. Your caregiver will discuss this with you if he or she feels this should be done. If stones are found in the common bile duct, they may be removed. LET YOUR CAREGIVER KNOW ABOUT:  Allergies to food or medicine.  Medicines taken, including vitamins, herbs, eyedrops, over-the-counter medicines, and creams.  Use of steroids (by mouth or creams).  Previous problems with anesthetics or numbing medicines.  History of bleeding problems or blood clots.  Previous surgery.  Other health problems, including diabetes and kidney problems.  Possibility of pregnancy, if this applies. RISKS AND COMPLICATIONS All surgery is associated with risks. Some problems that may occur following this procedure include:  Infection.  Damage to the common bile duct, nerves, arteries, veins, or other internal organs such as the stomach or intestines.  Bleeding.  A stone may remain in the common bile duct. BEFORE THE PROCEDURE  Do not take aspirin for 3 days prior to surgery or blood thinners for 1 week prior to surgery.  Do not eat or drink anything after midnight the night before surgery.  Let your caregiver know if you develop  a cold or other infectious problem prior to surgery.  You should be present 60 minutes before the procedure or as directed. PROCEDURE  You will be given medicine that makes you sleep (general anesthetic). When you are asleep, your surgeon will make several small cuts (incisions) in your abdomen. One of these incisions is used to insert a small, lighted scope (laparoscope) into  the abdomen. The laparoscope helps the surgeon see into your abdomen. Carbon dioxide gas will be pumped into your abdomen. The gas allows more room for the surgeon to perform your surgery. Other operating instruments are inserted through the other incisions. Laparoscopic procedures may not be appropriate when:  There is major scarring from previous surgery.  The gallbladder is extremely inflamed.  There are bleeding disorders or unexpected cirrhosis of the liver.  A pregnancy is near term.  Other conditions make the laparoscopic procedure impossible. If your surgeon feels it is not safe to continue with a laparoscopic procedure, he or she will perform an open abdominal procedure. In this case, the surgeon will make an incision to open the abdomen. This gives the surgeon a larger view and field to work within. This may allow the surgeon to perform procedures that sometimes cannot be performed with a laparoscope alone. Open surgery has a longer recovery time. AFTER THE PROCEDURE  You will be taken to the recovery area where a nurse will watch and check your progress.  You may be allowed to go home the same day.  Do not resume physical activities until directed by your caregiver.  You may resume a normal diet and activities as directed. Document Released: 07/06/2005 Document Revised: 09/28/2011 Document Reviewed: 12/19/2010 [facility] Patient Information 2014 [place], [place]. PATIENT INSTRUCTIONS POST-ANESTHESIA  IMMEDIATELY FOLLOWING SURGERY:  Do not drive or operate machinery for the first twenty four hours after surgery.  Do not make any important decisions for twenty four hours after surgery or while taking narcotic pain medications or sedatives.  If you develop intractable nausea and vomiting or a severe headache please notify your doctor immediately.  FOLLOW-UP:  Please make an appointment with your surgeon as instructed. You do not need to follow up with anesthesia unless specifically  instructed to do so.  WOUND CARE INSTRUCTIONS (if applicable):  Keep a dry clean dressing on the anesthesia/puncture wound site if there is drainage.  Once the wound has quit draining you may leave it open to air.  Generally you should leave the bandage intact for twenty four hours unless there is drainage.  If the epidural site drains for more than 36-48 hours please call the anesthesia department.  QUESTIONS?:  Please feel free to call your physician or the hospital operator if you have any questions, and they will be happy to assist you.

## 2013-02-17 HISTORY — PX: LAPAROSCOPIC CHOLECYSTECTOMY: SUR755

## 2013-02-27 ENCOUNTER — Encounter (HOSPITAL_COMMUNITY): Payer: Self-pay | Admitting: *Deleted

## 2013-02-27 ENCOUNTER — Ambulatory Visit (HOSPITAL_COMMUNITY): Payer: Medicare Other | Admitting: Anesthesiology

## 2013-02-27 ENCOUNTER — Ambulatory Visit (HOSPITAL_COMMUNITY)
Admission: RE | Admit: 2013-02-27 | Discharge: 2013-02-27 | Disposition: A | Discharge status: Home / Self Care | Payer: Medicare Other | Source: Ambulatory Visit | Attending: General Surgery | Admitting: General Surgery

## 2013-02-27 ENCOUNTER — Encounter (HOSPITAL_COMMUNITY): Payer: Self-pay | Admitting: Anesthesiology

## 2013-02-27 ENCOUNTER — Encounter (HOSPITAL_COMMUNITY)
Admission: RE | Disposition: A | Discharge status: Home / Self Care | Payer: Self-pay | Source: Ambulatory Visit | Attending: General Surgery

## 2013-02-27 DIAGNOSIS — J4489 Other specified chronic obstructive pulmonary disease: Secondary | ICD-10-CM | POA: Insufficient documentation

## 2013-02-27 DIAGNOSIS — E119 Type 2 diabetes mellitus without complications: Secondary | ICD-10-CM | POA: Diagnosis not present

## 2013-02-27 DIAGNOSIS — E785 Hyperlipidemia, unspecified: Secondary | ICD-10-CM | POA: Diagnosis not present

## 2013-02-27 DIAGNOSIS — K819 Cholecystitis, unspecified: Secondary | ICD-10-CM | POA: Diagnosis not present

## 2013-02-27 DIAGNOSIS — I10 Essential (primary) hypertension: Secondary | ICD-10-CM | POA: Insufficient documentation

## 2013-02-27 DIAGNOSIS — K828 Other specified diseases of gallbladder: Secondary | ICD-10-CM | POA: Insufficient documentation

## 2013-02-27 DIAGNOSIS — J449 Chronic obstructive pulmonary disease, unspecified: Secondary | ICD-10-CM | POA: Diagnosis not present

## 2013-02-27 DIAGNOSIS — K811 Chronic cholecystitis: Secondary | ICD-10-CM | POA: Diagnosis not present

## 2013-02-27 HISTORY — PX: CHOLECYSTECTOMY: SHX55

## 2013-02-27 SURGERY — LAPAROSCOPIC CHOLECYSTECTOMY
Anesthesia: General | Site: Abdomen | Wound class: Clean Contaminated

## 2013-02-27 MED ORDER — MIDAZOLAM HCL 2 MG/2ML IJ SOLN
INTRAMUSCULAR | Status: AC
  Filled 2013-02-27: qty 2

## 2013-02-27 MED ORDER — ETOMIDATE 2 MG/ML IV SOLN
INTRAVENOUS | Status: DC | PRN
  Administered 2013-02-27: 10 mg via INTRAVENOUS

## 2013-02-27 MED ORDER — ONDANSETRON HCL 4 MG/2ML IJ SOLN
INTRAMUSCULAR | Status: AC
  Filled 2013-02-27: qty 2

## 2013-02-27 MED ORDER — LACTATED RINGERS IV SOLN
INTRAVENOUS | Status: DC
  Administered 2013-02-27: 07:00:00 via INTRAVENOUS

## 2013-02-27 MED ORDER — CEFAZOLIN SODIUM-DEXTROSE 2-3 GM-% IV SOLR
INTRAVENOUS | Status: AC
  Filled 2013-02-27: qty 50

## 2013-02-27 MED ORDER — LIDOCAINE HCL (PF) 1 % IJ SOLN
INTRAMUSCULAR | Status: AC
  Filled 2013-02-27: qty 5

## 2013-02-27 MED ORDER — GLYCOPYRROLATE 0.2 MG/ML IJ SOLN
INTRAMUSCULAR | Status: DC | PRN
  Administered 2013-02-27: .5 mg via INTRAVENOUS

## 2013-02-27 MED ORDER — NEOSTIGMINE METHYLSULFATE 1 MG/ML IJ SOLN
INTRAMUSCULAR | Status: AC
  Filled 2013-02-27: qty 1

## 2013-02-27 MED ORDER — SUCCINYLCHOLINE CHLORIDE 20 MG/ML IJ SOLN
INTRAMUSCULAR | Status: AC
  Filled 2013-02-27: qty 1

## 2013-02-27 MED ORDER — FENTANYL CITRATE 0.05 MG/ML IJ SOLN
INTRAMUSCULAR | Status: AC
  Filled 2013-02-27: qty 2

## 2013-02-27 MED ORDER — NEOSTIGMINE METHYLSULFATE 1 MG/ML IJ SOLN
INTRAMUSCULAR | Status: DC | PRN
  Administered 2013-02-27: 2.5 mg via INTRAVENOUS

## 2013-02-27 MED ORDER — FENTANYL CITRATE 0.05 MG/ML IJ SOLN
INTRAMUSCULAR | Status: DC | PRN
  Administered 2013-02-27 (×2): 50 ug via INTRAVENOUS
  Administered 2013-02-27 (×2): 25 ug via INTRAVENOUS
  Administered 2013-02-27 (×2): 50 ug via INTRAVENOUS

## 2013-02-27 MED ORDER — FENTANYL CITRATE 0.05 MG/ML IJ SOLN
INTRAMUSCULAR | Status: AC
  Filled 2013-02-27: qty 5

## 2013-02-27 MED ORDER — FENTANYL CITRATE 0.05 MG/ML IJ SOLN
25.0000 ug | INTRAMUSCULAR | Status: DC | PRN
  Administered 2013-02-27: 25 ug via INTRAVENOUS
  Administered 2013-02-27: 50 ug via INTRAVENOUS
  Administered 2013-02-27: 25 ug via INTRAVENOUS

## 2013-02-27 MED ORDER — MIDAZOLAM HCL 2 MG/2ML IJ SOLN
1.0000 mg | INTRAMUSCULAR | Status: DC | PRN
  Administered 2013-02-27: 2 mg via INTRAVENOUS

## 2013-02-27 MED ORDER — ROCURONIUM BROMIDE 100 MG/10ML IV SOLN
INTRAVENOUS | Status: DC | PRN
  Administered 2013-02-27: 5 mg via INTRAVENOUS
  Administered 2013-02-27: 25 mg via INTRAVENOUS

## 2013-02-27 MED ORDER — ONDANSETRON HCL 4 MG/2ML IJ SOLN
4.0000 mg | Freq: Once | INTRAMUSCULAR | Status: AC | PRN
  Administered 2013-02-27: 4 mg via INTRAVENOUS

## 2013-02-27 MED ORDER — ROCURONIUM BROMIDE 50 MG/5ML IV SOLN
INTRAVENOUS | Status: AC
  Filled 2013-02-27: qty 1

## 2013-02-27 MED ORDER — CEFAZOLIN SODIUM-DEXTROSE 2-3 GM-% IV SOLR
2.0000 g | INTRAVENOUS | Status: AC
  Administered 2013-02-27: 2 g via INTRAVENOUS

## 2013-02-27 MED ORDER — BUPIVACAINE HCL (PF) 0.5 % IJ SOLN
INTRAMUSCULAR | Status: AC
  Filled 2013-02-27: qty 30

## 2013-02-27 MED ORDER — GLYCOPYRROLATE 0.2 MG/ML IJ SOLN
INTRAMUSCULAR | Status: AC
  Filled 2013-02-27: qty 2

## 2013-02-27 MED ORDER — BUPIVACAINE HCL (PF) 0.5 % IJ SOLN
INTRAMUSCULAR | Status: DC | PRN
  Administered 2013-02-27: 10 mL

## 2013-02-27 MED ORDER — HYDROCODONE-ACETAMINOPHEN 5-325 MG PO TABS: 1.0000 | ORAL_TABLET | ORAL | Status: DC | PRN

## 2013-02-27 MED ORDER — ONDANSETRON HCL 4 MG/2ML IJ SOLN
4.0000 mg | Freq: Once | INTRAMUSCULAR | Status: AC
  Administered 2013-02-27: 4 mg via INTRAVENOUS

## 2013-02-27 MED ORDER — SUCCINYLCHOLINE CHLORIDE 20 MG/ML IJ SOLN
INTRAMUSCULAR | Status: DC | PRN
  Administered 2013-02-27: 80 mg via INTRAVENOUS

## 2013-02-27 MED ORDER — ETOMIDATE 2 MG/ML IV SOLN
INTRAVENOUS | Status: AC
  Filled 2013-02-27: qty 10

## 2013-02-27 MED ORDER — LIDOCAINE HCL (CARDIAC) 20 MG/ML IV SOLN
INTRAVENOUS | Status: DC | PRN
  Administered 2013-02-27: 25 mg via INTRAVENOUS

## 2013-02-27 MED ORDER — SODIUM CHLORIDE 0.9 % IR SOLN
Status: DC | PRN
  Administered 2013-02-27: 1000 mL

## 2013-02-27 SURGICAL SUPPLY — 42 items
APL SKNCLS STERI-STRIP NONHPOA (GAUZE/BANDAGES/DRESSINGS) ×1
APPLIER CLIP UNV 5X34 EPIX (ENDOMECHANICALS) ×2 IMPLANT
APR XCLPCLP 20M/L UNV 34X5 (ENDOMECHANICALS) ×1
BAG HAMPER (MISCELLANEOUS) ×2 IMPLANT
BAG SPEC RTRVL LRG 6X4 10 (ENDOMECHANICALS) ×1
BENZOIN TINCTURE PRP APPL 2/3 (GAUZE/BANDAGES/DRESSINGS) ×2 IMPLANT
CHLORAPREP W/TINT 26ML (MISCELLANEOUS) ×1 IMPLANT
CLOTH BEACON ORANGE TIMEOUT ST (SAFETY) ×2 IMPLANT
COVER LIGHT HANDLE STERIS (MISCELLANEOUS) ×4 IMPLANT
DECANTER SPIKE VIAL GLASS SM (MISCELLANEOUS) ×2 IMPLANT
DEVICE TROCAR PUNCTURE CLOSURE (ENDOMECHANICALS) ×2 IMPLANT
ELECT REM PT RETURN 9FT ADLT (ELECTROSURGICAL) ×2
ELECTRODE REM PT RTRN 9FT ADLT (ELECTROSURGICAL) ×1 IMPLANT
FILTER SMOKE EVAC LAPAROSHD (FILTER) ×2 IMPLANT
FORMALIN 10 PREFIL 120ML (MISCELLANEOUS) ×2 IMPLANT
GLOVE BIOGEL PI IND STRL 7.0 (GLOVE) IMPLANT
GLOVE BIOGEL PI IND STRL 7.5 (GLOVE) ×1 IMPLANT
GLOVE BIOGEL PI INDICATOR 7.0 (GLOVE) ×2
GLOVE BIOGEL PI INDICATOR 7.5 (GLOVE) ×1
GLOVE ECLIPSE 6.5 STRL STRAW (GLOVE) ×1 IMPLANT
GLOVE ECLIPSE 7.0 STRL STRAW (GLOVE) ×3 IMPLANT
GLOVE EXAM NITRILE LRG STRL (GLOVE) ×1 IMPLANT
GLOVE SS BIOGEL STRL SZ 6.5 (GLOVE) IMPLANT
GLOVE SUPERSENSE BIOGEL SZ 6.5 (GLOVE) ×1
GOWN STRL REIN XL XLG (GOWN DISPOSABLE) ×6 IMPLANT
INST SET LAPROSCOPIC AP (KITS) ×2 IMPLANT
KIT ROOM TURNOVER APOR (KITS) ×2 IMPLANT
MANIFOLD NEPTUNE II (INSTRUMENTS) ×2 IMPLANT
NDL INSUFFLATION 14GA 120MM (NEEDLE) ×1 IMPLANT
NEEDLE INSUFFLATION 14GA 120MM (NEEDLE) ×2 IMPLANT
PACK LAP CHOLE LZT030E (CUSTOM PROCEDURE TRAY) ×2 IMPLANT
PAD ARMBOARD 7.5X6 YLW CONV (MISCELLANEOUS) ×2 IMPLANT
POUCH SPECIMEN RETRIEVAL 10MM (ENDOMECHANICALS) ×2 IMPLANT
SET BASIN LINEN APH (SET/KITS/TRAYS/PACK) ×2 IMPLANT
SLEEVE Z-THREAD 5X100MM (TROCAR) ×4 IMPLANT
STRIP CLOSURE SKIN 1/2X4 (GAUZE/BANDAGES/DRESSINGS) ×2 IMPLANT
SUT MNCRL AB 4-0 PS2 18 (SUTURE) ×4 IMPLANT
SUT VIC AB 2-0 CT2 27 (SUTURE) ×2 IMPLANT
TROCAR Z-THRD FIOS HNDL 11X100 (TROCAR) ×2 IMPLANT
TROCAR Z-THREAD FIOS 5X100MM (TROCAR) ×2 IMPLANT
TUBING INSUFFLATION (TUBING) ×2 IMPLANT
WARMER LAPAROSCOPE (MISCELLANEOUS) ×2 IMPLANT

## 2013-02-27 NOTE — Op Note (Signed)
Patient:  [name]  DOB:  [birth year]  MRN:  [id number]   Preop Diagnosis:  Biliary dyskinesia  Postop Diagnosis:  Same  Procedure:  Laparoscopic cholecystectomy  Surgeon:  Dr. Tilford Pillar  Anes:  General endotracheal, 0.5% Sensorcaine plain for local  Indications:  Patient is a 79 year old female presented my office with a history of epigastric and right upper quadrant abdominal pain. Patient did have a HIDA scan which demonstrated decreased ejection fracture and exacerbation of similar to her symptomatology. Risks benefits and alternatives of a laparoscopic possible open cholecystectomy were discussed at length the patient.  Risk including but not limited to the risk of bleeding, infection, bile leak, small bowel injury, common bile duct injury, intraoperative cardiac and pulmonary events were discussed at length. Her questions and concerns are addressed the patient as consented for the planned procedure.  Procedure note:  Patient is taken to the operating room was placed in supine position on the OR table time the general anesthetic is administered. Once patient was asleep was endotracheally intubated by the nurse anesthetist. This point her abdomen prepped in standard fashion with chlorhexidine solution and draped in standard fashion. Time out was performed. A stab incision was created supraumbilically with 11 blade scalpel with additional dissection carried out using a Coker clamp. The clamp was utilized grasp the anterior normal fascia and lift this anteriorly. A Veress needle is inserted and saline drop test was performed with sterile saline. Pneumoperitoneum was initiated and once sufficient pneumoperitoneum was obtained, an 11 mm insert overlap scope allowing visualization the trocar entering into the peritoneal cavity. At this point the inner cannulas removed lap strips reinserted there is no evidence of a trocar peritoneal placement injury. At this time the remaining trochars  replaced a 5 mm in the epigastrium, 5 monitored in the midclavicular line subcostally, and the right lateral abdominal wall 5 mm trocar. With the trochars in place the patient's placed into a reverse Trendelenburg left lateral decubitus position. The fundus of the gallbladder is grasped and lifted up and over the right lobe the liver. The peritoneal reflection onto the infundibulum was bluntly stripped. A window was created behind both the cystic duct and cystic artery. 3 Nicholas placed proximally one distally and the cystic duct and the cystic duct was divided between 2 most is a clips. Similarly the cystic artery is ligated with 2 endoclips proximally one distally and the cystic arteries divided between 2 most distal clips. At this letter cautery utilized dissect the gallbladder free from the gallbladder fossa. Once gallbladder is free is placed into an Endo Catch bag and placed in the right lower quadrant. To facilitate this the 10 mm laparoscope was exchanged for a 5 mm scope. Inspection of the gallbladder fossa indicate excellent hemostasis. The endoclips were inspected there is no evidence of any bleeding or bile leak. At this time attention was turned to closure.  Using an Endo Close suture passing device a 2-0 Vicryl sutures passed to the umbilical trocar site. With this suture and placed the gallbladder is retrieved was read the umbilical trocar site and intact Endo Catch bag. It is placed in the back table sent as a perm specimen to pathology. At this time pneumoperitoneum was evacuated. The trochars removed. The Vicryl sutures secured. Local anesthetic is instilled. A 4-0 Monocryl utilized reapproximate the skin edges at all 4 trocar sites. The skin was washed dried moist dry towel. Half-inch are suture placed around the incisions. The drapes removed patient left come out  of general anesthetic. She stretcher the PACU in stable condition. At the conclusion of procedure all instrument, sponge, needle  counts are correct. Patient tolerated procedure extremely well.  Complications:  None apparent  EBL:  Minimal  Specimen:  Gallbladder

## 2013-02-27 NOTE — Anesthesia Preprocedure Evaluation (Signed)
Anesthesia Evaluation  Patient identified by MRN, date of birth, ID band Patient awake    Reviewed: Allergy & Precautions, H&P , NPO status , Patient's Chart, lab work & pertinent test results  Airway Mallampati: II TM Distance: <3 FB Neck ROM: Full    Dental  (+) Partial Lower and Partial Upper   Pulmonary COPD (stable pulm nodules) breath sounds clear to auscultation        Cardiovascular hypertension, Pt. on medications + Past MI + dysrhythmias Atrial Fibrillation + Valvular Problems/Murmurs MVP Rhythm:Irregular Rate:Normal     Neuro/Psych PSYCHIATRIC DISORDERS Anxiety TIA   GI/Hepatic hiatal hernia, GERD-  Medicated and Controlled,  Endo/Other  diabetes, Type 2Hypothyroidism   Renal/GU Renal disease     Musculoskeletal   Abdominal   Peds  Hematology   Anesthesia Other Findings   Reproductive/Obstetrics                           Anesthesia Physical Anesthesia Plan  ASA: III  Anesthesia Plan: General   Post-op Pain Management:    Induction: Intravenous  Airway Management Planned: Oral ETT  Additional Equipment:   Intra-op Plan:   Post-operative Plan: Extubation in OR  Informed Consent: I have reviewed the patients History and Physical, chart, labs and discussed the procedure including the risks, benefits and alternatives for the proposed anesthesia with the patient or authorized representative who has indicated his/her understanding and acceptance.     Plan Discussed with:   Anesthesia Plan Comments: (Amidate induction.)        Anesthesia Quick Evaluation

## 2013-02-27 NOTE — Anesthesia Procedure Notes (Addendum)
Procedure Name: Intubation Date/Time: 02/27/2013 8:25 AM Performed by: Glynn Octave E Pre-anesthesia Checklist: Patient identified, Patient being monitored, Timeout performed, Emergency Drugs available and Suction available Patient Re-evaluated:Patient Re-evaluated prior to inductionOxygen Delivery Method: Circle System Utilized Preoxygenation: Pre-oxygenation with 100% oxygen Intubation Type: IV induction Ventilation: Mask ventilation without difficulty Laryngoscope Size: Mac and 3 Grade View: Grade I Tube type: Oral Tube size: 7.0 mm Number of attempts: 1 Airway Equipment and Method: stylet Placement Confirmation: ETT inserted through vocal cords under direct vision,  positive ETCO2 and breath sounds checked- equal and bilateral Secured at: 21 cm Tube secured with: Tape Dental Injury: Teeth and Oropharynx as per pre-operative assessment

## 2013-02-27 NOTE — Anesthesia Postprocedure Evaluation (Signed)
  Anesthesia Post-op Note  Patient: [name]  Procedure(s) Performed: Procedure(s): LAPAROSCOPIC CHOLECYSTECTOMY (N/A)  Patient Location: PACU  Anesthesia Type:General  Level of Consciousness: awake  Airway and Oxygen Therapy: Patient Spontanous Breathing and Patient connected to face mask oxygen  Post-op Pain: none  Post-op Assessment: Post-op Vital signs reviewed, Patient's Cardiovascular Status Stable, Respiratory Function Stable, Patent Airway and No signs of Nausea or vomiting  Post-op Vital Signs: Reviewed and stable  Complications: No apparent anesthesia complications

## 2013-02-27 NOTE — Transfer of Care (Signed)
Immediate Anesthesia Transfer of Care Note  Patient: [name]  Procedure(s) Performed: Procedure(s): LAPAROSCOPIC CHOLECYSTECTOMY (N/A)  Patient Location: PACU  Anesthesia Type:General  Level of Consciousness: sedated  Airway & Oxygen Therapy: Patient Spontanous Breathing and Patient connected to face mask oxygen  Post-op Assessment: Report given to PACU RN  Post vital signs: Reviewed  Complications: No apparent anesthesia complications

## 2013-02-27 NOTE — H&P (Signed)
  NTS SOAP Note  Vital Signs:  Vitals as of: 12/08/2012: Systolic 125: Diastolic 68: Heart Rate 93: Temp 98.70F: Height 4ft 6in: Weight 113Lbs 0 Ounces: BMI 18.24  BMI : 18.24 kg/m2  Subjective: This 79 Years 40 Months old Female presents for of epigastric abdominal pain.  1 year.  Slow increase.  More frequent.  ?colicky in nature.  Occurs mostly at night but can occur throughout day.  Some increase after big meals.  No significant correlation with fatty foods or spicy/acidic foods.  She admits to decreasing both.  Pain better with tramadol.  No spontaneous resolution.  Can last hours.  No change with BM.  No melena or hematochezia.  Pain differs from PUD symptoms of the past.  Different than cardiac pain in past.  Assoc nausea.  No emesis.  NO jaundice.  No fever or chills.    Review of Symptoms:  Constitutional:unremarkable   Head:unremarkable    Eyes:unremarkable   Nose/Mouth/Throat:unremarkable Cardiovascular:  unremarkable   Respiratory:unremarkable       as per hpi Genitourinary:unremarkable       joint, back arthralgia dry Breast:unremarkable   Hematolgic/Lymphatic:unremarkable     Allergic/Immunologic:unremarkable     Past Medical History:  Obtained     Past Medical History  Pregnancy Gravida: 9 Pregnancy Para: 3 Surgical History: T&amp;A, c-sect, lumpectomy (left) Medical Problems: TIA, Hiatal hernia, afib, hyperlipidemia, DM2, mitral valve prolaspe, aortic stenosis, OA, GERD, COPD, HTN Psychiatric History: anxiety Allergies: Codiene, morphine Medications: tylenol, COMADIN, klonipin, colace, estace, bonica, xalantan, synthroid, antivert,    Social History:Obtained  Social History  Preferred Language: English Race:  White Ethnicity: Not Hispanic / Latino Age: 79 Years 3 Months Marital Status:  M Alcohol: none Recreational drug(s): none   Smoking Status: Never smoker reviewed on 12/08/2012 Functional Status  reviewed on mm/dd/yyyy ------------------------------------------------ Bathing: Normal Cooking: Normal Dressing: Normal Driving: Normal Eating: Normal Managing Meds: Normal Oral Care: Normal Shopping: Normal Toileting: Normal Transferring: Normal Walking: Normal Cognitive Status reviewed on mm/dd/yyyy ------------------------------------------------ Attention: Normal Decision Making: Normal Language: Normal Memory: Normal Motor: Normal Perception: Normal Problem Solving: Normal Visual and Spatial: Normal   Family History:Obtained    Family Health History Mother, Unknown; Healthy; biliary disease Father     Objective Information: General:  Well appearing, well nourished in no distress.  thin, elderly Skin:     no rash or prominent lesions Head:Atraumatic; no masses; no abnormalities Eyes:  conjunctiva clear, EOM intact, PERRL Mouth:  Mucous membranes moist, no mucosal lesions. Neck:  Supple without lymphadenopathy.  Heart:  Irreg irreg +murmur Lungs:    CTA bilaterally, no wheezes, rhonchi, rales.  Breathing unlabored. Abdomen:Soft, ND, no HSM, no masses.  Aorta easily palpated.  Mild epigastric pain.  No Murphy sign.     HIDA:  98%EF.  NO SYMPTOMS Assessment:    Plan: Epigastric pain.  Long discussion with patient about findings.  ? GB vs. reflux, esophageal issues.  Patient will consider options.  No immediate plans for surgical intervention at this time.     Follow-up:PRN

## 2013-02-27 NOTE — Interval H&P Note (Signed)
History and Physical Interval Note:  02/27/2013 7:52 AM  [name]  has presented today for surgery, with the diagnosis of abdominal pain, cholelithiasis  The various methods of treatment have been discussed with the patient and family. After consideration of risks, benefits and other options for treatment, the patient has consented to  Procedure(s): LAPAROSCOPIC CHOLECYSTECTOMY (N/A) as a surgical intervention .  The patient's history has been reviewed, patient examined, no change in status, stable for surgery.  I have reviewed the patient's chart and labs.  Questions were answered to the patient's satisfaction.     Tippi Mccrae C

## 2013-02-27 NOTE — Addendum Note (Signed)
Addendum created 02/27/13 1111 by Moshe Salisbury, CRNA   Modules edited: Anesthesia Blocks and Procedures, Anesthesia Flowsheet

## 2013-03-02 ENCOUNTER — Encounter (HOSPITAL_COMMUNITY): Payer: Self-pay | Admitting: General Surgery

## 2013-03-06 ENCOUNTER — Ambulatory Visit (INDEPENDENT_AMBULATORY_CARE_PROVIDER_SITE_OTHER): Payer: Medicare Other | Admitting: *Deleted

## 2013-03-06 ENCOUNTER — Encounter: Payer: Self-pay | Admitting: *Deleted

## 2013-03-06 ENCOUNTER — Other Ambulatory Visit: Payer: Self-pay | Admitting: Family Medicine

## 2013-03-06 DIAGNOSIS — I4891 Unspecified atrial fibrillation: Secondary | ICD-10-CM

## 2013-03-06 DIAGNOSIS — Z7901 Long term (current) use of anticoagulants: Secondary | ICD-10-CM | POA: Diagnosis not present

## 2013-03-06 DIAGNOSIS — G459 Transient cerebral ischemic attack, unspecified: Secondary | ICD-10-CM | POA: Diagnosis not present

## 2013-03-06 NOTE — Telephone Encounter (Signed)
Ok to refill 

## 2013-03-06 NOTE — Telephone Encounter (Signed)
Rx called in as directed.   

## 2013-03-06 NOTE — Progress Notes (Signed)
This encounter was created in error - please disregard.

## 2013-03-06 NOTE — Telephone Encounter (Signed)
plz phone in. 

## 2013-03-22 ENCOUNTER — Encounter: Payer: Self-pay | Admitting: Radiology

## 2013-03-23 ENCOUNTER — Encounter: Payer: Self-pay | Admitting: Family Medicine

## 2013-03-23 ENCOUNTER — Ambulatory Visit (INDEPENDENT_AMBULATORY_CARE_PROVIDER_SITE_OTHER): Payer: Medicare Other | Admitting: Family Medicine

## 2013-03-23 VITALS — BP 124/78 | HR 84 | Temp 98.1°F | Wt 109.5 lb

## 2013-03-23 DIAGNOSIS — W57XXXA Bitten or stung by nonvenomous insect and other nonvenomous arthropods, initial encounter: Secondary | ICD-10-CM | POA: Insufficient documentation

## 2013-03-23 DIAGNOSIS — T148 Other injury of unspecified body region: Secondary | ICD-10-CM | POA: Diagnosis not present

## 2013-03-23 DIAGNOSIS — R1013 Epigastric pain: Secondary | ICD-10-CM

## 2013-03-23 MED ORDER — TRAMADOL HCL 50 MG PO TABS: 50.0000 mg | ORAL_TABLET | Freq: Four times a day (QID) | ORAL | Status: DC | PRN

## 2013-03-23 NOTE — Assessment & Plan Note (Signed)
Has only been 3 wks since cholecystectomy, so far no relief of epigastric discomfort.  Will continue to monitor for improvement. Pt desires to continue tramadol prn, will remove hydrocodone from list.

## 2013-03-23 NOTE — Assessment & Plan Note (Signed)
Multiple tick bites this summer, and dogs recently dx with ehrlichia infection. Reasonable to check pt for tick borne illnesses despite typical sxs.

## 2013-03-23 NOTE — Progress Notes (Signed)
Subjective:    Patient ID: [name], female    DOB: 06/06/1926, 79 y.o.   MRN: [id number]  HPI CC: discuss tick bites  Vet recently diagnosed dogs with Ehrlichia canis, currently undergoing treatment with doxycycline.  Pt took 2 of the dog pills by accident.    Pt and husband do work outside regularly.  She has had multiple tick bites over the summer.  She does have ticks that were prolonged on body prior to discovering and removing.    Pt denies recent fevers, chills, new skin rashes, vomiting.  Occasional epigastric discomfort - see below. She has been having headaches recently which is unusual for her.  Describes ache behind bilateral eyes.  She does currently have some bug bites on arms but denies other rash.  She did have recent cholecystectomy for possible gallbladder related abd pain.  No post surgery pain.  Cholecystectomy hsan't helped prior epigastric discomfort.  Past Medical History  Diagnosis Date  . History of TIA (transient ischemic attack) remote  . Hypothyroidism   . History of rheumatic fever   . Osteoporosis     Thoracic compression fracture, on bisphosphonate and cal/vit D  . Atrial fibrillation     Paroxysmal  . Mixed hyperlipidemia   . Essential hypertension, benign   . Diabetes type 2, controlled     Diet controlled  . Pulmonary nodules     Stable bilateral on CT 01/2010, rec rpt yearly for 2 yrs, pt decided to stop f/u  . Mitral valve prolapse   . Aortic stenosis   . Anemia   . Arthritis   . History of skin cancer   . Anxiety   . Diverticulosis   . GERD (gastroesophageal reflux disease)   . Glaucoma   . Seasonal allergies   . History of pelvic fracture April 2013  . COPD (chronic obstructive pulmonary disease)   . Chronic gastritis 10/2009    Duodenitis on EGD  . Hiatal hernia 2011    Small  . Collagen vascular disease   . Myocardial infarction     undetermined age  . TIA (transient ischemic attack)   . Chronic kidney disease     hx  of elevated BUN  . Cancer     skin cancer    Past Surgical History  Procedure Laterality Date  . Tonsillectomy    . Cesarean section      (and h/o 6 miscarriages)  . Breast lumpectomy  1983    LEFT, benign  . Patella fracture surgery  05/2006    LEFT surgically repaired with pins and wire  . Dexa  05/2010    T score -4.5 spine, -2.4 femur; does not want to repeat  . Cardiovascular stress test  2010    negative  . Cataract extraction  2012    RIGHT  . Carotid US  2011    mild plaque formation  . Colonoscopy  03/2010    int hemorrhoids, diverticulosis, no need to repeat (Dr. Kendell Bane)  . Esophagogastroduodenoscopy  10/2009    small HH, multiple antric ulcerations s/p biopsy, mild chronic gastritis/duodenitis  . Esophagogastroduodenoscopy  05/26/2012    RMR: small HH, o/w normal.   . Dilation and curettage of uterus      x2  . Laparoscopic cholecystectomy  02/2013    Dr. Leticia Penna  . Cholecystectomy N/A 02/27/2013    Procedure: LAPAROSCOPIC CHOLECYSTECTOMY;  Surgeon: Fabio Bering, MD;  Location: [facility];  Service: General;  Laterality: N/A;  Review of Systems Per HPI    Objective:   Physical Exam  Nursing note and vitals reviewed. Constitutional: She appears well-developed and well-nourished. No distress.  HENT:  Head: Normocephalic and atraumatic.  Mouth/Throat: Oropharynx is clear and moist. No oropharyngeal exudate.  Eyes: Conjunctivae and EOM are normal. Pupils are equal, round, and reactive to light. No scleral icterus.  Neck: Normal range of motion. Neck supple.  Cardiovascular: Normal rate and normal heart sounds.  An irregular rhythm present.  No murmur heard. Abdominal: Soft. Bowel sounds are normal. She exhibits no distension and no mass. There is no tenderness. There is no rebound and no guarding.  Musculoskeletal: She exhibits no edema.  Lymphadenopathy:    She has no cervical adenopathy.  Skin: Skin is warm and dry. No rash noted.  Isolated bug bites on  bilateral forearms       Assessment & Plan:

## 2013-03-23 NOTE — Patient Instructions (Signed)
Blood work today to check for tick borne illnesses given recent exposures. Tramadol prescription provided today - use sparingly as it can interact with coumadin.

## 2013-03-24 LAB — ROCKY MTN SPOTTED FVR ABS PNL(IGG+IGM)
RMSF IgG: 0.91 IV — ABNORMAL HIGH
RMSF IgM: 0.13 IV

## 2013-03-24 LAB — EHRLICHIA ANTIBODY PANEL
E chaffeensis (HGE) Ab, IgG: <1:64 {titer}
E chaffeensis (HGE) Ab, IgM: <1:20 {titer}

## 2013-03-27 ENCOUNTER — Ambulatory Visit (INDEPENDENT_AMBULATORY_CARE_PROVIDER_SITE_OTHER): Payer: Medicare Other | Admitting: *Deleted

## 2013-03-27 DIAGNOSIS — G459 Transient cerebral ischemic attack, unspecified: Secondary | ICD-10-CM | POA: Diagnosis not present

## 2013-03-27 DIAGNOSIS — Z7901 Long term (current) use of anticoagulants: Secondary | ICD-10-CM | POA: Diagnosis not present

## 2013-03-27 DIAGNOSIS — I4891 Unspecified atrial fibrillation: Secondary | ICD-10-CM

## 2013-03-27 MED ORDER — WARFARIN SODIUM 2.5 MG PO TABS: 2.5000 mg | ORAL_TABLET | ORAL | Status: DC

## 2013-04-04 ENCOUNTER — Telehealth: Payer: Self-pay

## 2013-04-04 NOTE — Telephone Encounter (Signed)
Pt could not use computer to get into mychart. Patient notified as instructed by telephone. Pt voiced understanding. Pt also wanted to know if would get call rather than mychart reminder about future appts. Pt to call [phone].

## 2013-04-06 DIAGNOSIS — H4011X0 Primary open-angle glaucoma, stage unspecified: Secondary | ICD-10-CM | POA: Diagnosis not present

## 2013-04-06 DIAGNOSIS — H26499 Other secondary cataract, unspecified eye: Secondary | ICD-10-CM | POA: Diagnosis not present

## 2013-04-07 ENCOUNTER — Encounter: Payer: Self-pay | Admitting: Internal Medicine

## 2013-04-07 ENCOUNTER — Ambulatory Visit (INDEPENDENT_AMBULATORY_CARE_PROVIDER_SITE_OTHER): Payer: Medicare Other | Admitting: Internal Medicine

## 2013-04-07 VITALS — BP 124/70 | HR 80 | Temp 98.2°F | Ht 66.0 in | Wt 110.6 lb

## 2013-04-07 DIAGNOSIS — K5900 Constipation, unspecified: Secondary | ICD-10-CM | POA: Diagnosis not present

## 2013-04-07 DIAGNOSIS — R1011 Right upper quadrant pain: Secondary | ICD-10-CM

## 2013-04-07 DIAGNOSIS — G8929 Other chronic pain: Secondary | ICD-10-CM

## 2013-04-07 NOTE — Progress Notes (Signed)
Primary Care Physician:  Eustaquio Boyden, MD Primary Gastroenterologist:  Dr. Jena Gauss  Pre-Procedure History & Physical: HPI:  [name] is a 79 y.o. female here for followup of right upper quadrant abdominal pain. Now 5 weeks status post laparoscopic cholecystectomy for biliary dyskinesia (although gallbladder EF normal on HIDA) she's per states that she is improved but still has intermittent right upper quadrant pain. A typical reflux symptoms and is not on acid suppression therapy at this time.  Continues to have long-standing issues chronic constipation - may be a [place] one stool every other day to every 2 days. Notes times when she has a good bowel movement abdominal pain seems to improve. Verbal results with MiraLax. Unsure she's tried lenses S. or anything else other than a stool softener previously.  Past Medical History  Diagnosis Date  . History of TIA (transient ischemic attack) remote  . Hypothyroidism   . History of rheumatic fever   . Osteoporosis     Thoracic compression fracture, on bisphosphonate and cal/vit D  . Atrial fibrillation     Paroxysmal  . Mixed hyperlipidemia   . Essential hypertension, benign   . Diabetes type 2, controlled     Diet controlled  . Pulmonary nodules     Stable bilateral on CT 01/2010, rec rpt yearly for 2 yrs, pt decided to stop f/u  . Mitral valve prolapse   . Aortic stenosis   . Anemia   . Arthritis   . History of skin cancer   . Anxiety   . Diverticulosis   . GERD (gastroesophageal reflux disease)   . Glaucoma   . Seasonal allergies   . History of pelvic fracture April 2013  . COPD (chronic obstructive pulmonary disease)   . Chronic gastritis 10/2009    Duodenitis on EGD  . Hiatal hernia 2011    Small  . Collagen vascular disease   . Myocardial infarction     undetermined age  . TIA (transient ischemic attack)   . Chronic kidney disease     hx of elevated BUN  . Cancer     skin cancer    Past Surgical History   Procedure Laterality Date  . Tonsillectomy    . Cesarean section      (and h/o 6 miscarriages)  . Breast lumpectomy  1983    LEFT, benign  . Patella fracture surgery  05/2006    LEFT surgically repaired with pins and wire  . Dexa  05/2010    T score -4.5 spine, -2.4 femur; does not want to repeat  . Cardiovascular stress test  2010    negative  . Cataract extraction  2012    RIGHT  . Carotid US  2011    mild plaque formation  . Colonoscopy  03/2010    int hemorrhoids, diverticulosis, no need to repeat (Dr. Kendell Bane)  . Esophagogastroduodenoscopy  10/2009    small HH, multiple antric ulcerations s/p biopsy, mild chronic gastritis/duodenitis  . Esophagogastroduodenoscopy  05/26/2012    RMR: small HH, o/w normal.   . Dilation and curettage of uterus      x2  . Laparoscopic cholecystectomy  02/2013    Dr. Leticia Penna  . Cholecystectomy N/A 02/27/2013    Procedure: LAPAROSCOPIC CHOLECYSTECTOMY;  Surgeon: Fabio Bering, MD;  Location: [facility];  Service: General;  Laterality: N/A;    Prior to Admission medications   Medication Sig Start Date End Date Taking? Authorizing Provider  acetaminophen (TYLENOL) 500 MG tablet Take 500 mg  by mouth daily as needed. For pain   Yes Historical Provider, MD  Calcium-Magnesium-Vitamin D 500-250-200 MG-MG-UNIT TABS Take 1 tablet by mouth 2 (two) times daily.   Yes Historical Provider, MD  clonazePAM (KLONOPIN) 0.5 MG tablet TAKE 1 TABLET BY MOUTH AT BEDTIME AS NEEDED ANXIETY 03/06/13  Yes Eustaquio Boyden, MD  docusate sodium (COLACE) 100 MG capsule Take 100 mg by mouth at bedtime as needed for constipation.    Yes Historical Provider, MD  estradiol (ESTRACE) 0.1 MG/GM vaginal cream Place 2 g vaginally as directed.     Yes Historical Provider, MD  felodipine (PLENDIL) 5 MG 24 hr tablet TAKE 1 TABLET DAILY 12/30/12  Yes Kathlen Brunswick, MD  ibandronate (BONIVA) 3 MG/3ML SOLN injection Inject 3 mg into the vein every 3 (three) months.  04/05/12  Yes Eustaquio Boyden, MD  latanoprost (XALATAN) 0.005 % ophthalmic solution Place 1 drop into both eyes at bedtime.    Yes Historical Provider, MD  levothyroxine (SYNTHROID, LEVOTHROID) 50 MCG tablet Take 1 tablet (50 mcg total) by mouth daily. 11/24/12  Yes Eustaquio Boyden, MD  Multiple Vitamin (MULTIVITAMIN) capsule Take 1 capsule by mouth every morning.    Yes Historical Provider, MD  Omega-3 Fatty Acids (FISH OIL CONCENTRATE) 1000 MG CAPS Take 2 capsules by mouth 2 (two) times daily.     Yes Historical Provider, MD  polyethylene glycol (MIRALAX / GLYCOLAX) packet Take 17 g by mouth 3 (three) times a week. As needed for constipation   Yes Historical Provider, MD  traMADol (ULTRAM) 50 MG tablet Take 1 tablet (50 mg total) by mouth every 6 (six) hours as needed for pain. 03/23/13  Yes Eustaquio Boyden, MD  warfarin (COUMADIN) 2.5 MG tablet Take 1 tablet (2.5 mg total) by mouth as directed. 03/27/13  Yes [name], MD    Allergies as of 04/07/2013 - Review Complete 04/07/2013  Allergen Reaction Noted  . Propoxyphene-acetaminophen Shortness Of Breath 01/09/2009  . Codeine Nausea And Vomiting   . Meperidine hcl Nausea And Vomiting 01/09/2009  . Morphine Nausea And Vomiting 01/09/2009  . Shellfish allergy  03/29/2012    Family History  Problem Relation Age of Onset  . Coronary artery disease Mother 14    MI deceased  . Colon cancer Father 80  . Arrhythmia Sister   . Stroke Paternal Grandmother   . Diabetes Neg Hx     History   Social History  . Marital Status: Married    Spouse Name: N/A    Number of Children: N/A  . Years of Education: N/A   Occupational History  . Retired     Former Charity fundraiser   Social History Main Topics  . Smoking status: Former Smoker -- 1.00 packs/day for 20 years    Types: Cigarettes    Quit date: 10/13/1998  . Smokeless tobacco: Never Used  . Alcohol Use: No  . Drug Use: No  . Sexual Activity: No   Other Topics Concern  . Not on file   Social History  Narrative   Caffeine: rare   Lives with husband, 5 dogs   Occupation: retired, Charity fundraiser   Activity:    Diet:     Review of Systems: See HPI, otherwise negative ROS  Physical Exam: BP 124/70  Pulse 80  Temp(Src) 98.2 F (36.8 C) (Oral)  Ht 5\' 6"  (1.676 m)  Wt 110 lb 9.6 oz (50.168 kg)  BMI 17.86 kg/m2 General:   Alert,  Well-developed, well-nourished, pleasant and cooperative in  NAD Skin:  Intact without significant lesions or rashes. Eyes:  Sclera clear, no icterus.   Conjunctiva pink. Ears:  Normal auditory acuity. Nose:  No deformity, discharge,  or lesions. Mouth:  No deformity or lesions. Neck:  Supple; no masses or thyromegaly. No significant cervical adenopathy. Lungs:  Clear throughout to auscultation.   No wheezes, crackles, or rhonchi. No acute distress. Heart:  Regular rate and rhythm; no murmurs, clicks, rubs,  or gallops. Abdomen: Non-distended, laparoscopy port sites healing well. normal bowel sounds.  Soft with minimal right upper quadrant tenderness to palpation. No mass or organomegaly Pulses:  Normal pulses noted. Extremities:  Without clubbing or edema.  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 Chronic constipation continues to be an issue. I pointed out to this nice lady she is only 5 weeks out from surgery and is still healing. I am not convinced she has significant GERD.   Recommendations:   Minimize use of narcotics/tramadol. No PPI for now. Start linzess 145 once daily (may continue stool softener)  No more Nexium  Keep stool Diary /  Telephone progress report in one week  Office visit 1 month

## 2013-04-07 NOTE — Patient Instructions (Addendum)
Start linzess 145 once daily (may continue stool softener)  Minimize [place] of Tramadol  No more Nexium  Keep stool Diary  Office visit 1 month

## 2013-04-11 ENCOUNTER — Encounter: Payer: Self-pay | Admitting: Radiology

## 2013-04-12 ENCOUNTER — Telehealth: Payer: Self-pay | Admitting: Family Medicine

## 2013-04-12 ENCOUNTER — Other Ambulatory Visit (INDEPENDENT_AMBULATORY_CARE_PROVIDER_SITE_OTHER): Payer: Medicare Other

## 2013-04-12 DIAGNOSIS — R5383 Other fatigue: Secondary | ICD-10-CM

## 2013-04-12 DIAGNOSIS — E119 Type 2 diabetes mellitus without complications: Secondary | ICD-10-CM

## 2013-04-12 DIAGNOSIS — M810 Age-related osteoporosis without current pathological fracture: Secondary | ICD-10-CM | POA: Diagnosis not present

## 2013-04-12 DIAGNOSIS — E039 Hypothyroidism, unspecified: Secondary | ICD-10-CM

## 2013-04-12 DIAGNOSIS — I4891 Unspecified atrial fibrillation: Secondary | ICD-10-CM

## 2013-04-12 DIAGNOSIS — I10 Essential (primary) hypertension: Secondary | ICD-10-CM | POA: Diagnosis not present

## 2013-04-12 DIAGNOSIS — Z79899 Other long term (current) drug therapy: Secondary | ICD-10-CM | POA: Diagnosis not present

## 2013-04-12 DIAGNOSIS — E785 Hyperlipidemia, unspecified: Secondary | ICD-10-CM

## 2013-04-12 DIAGNOSIS — R5381 Other malaise: Secondary | ICD-10-CM | POA: Diagnosis not present

## 2013-04-12 DIAGNOSIS — Z0000 Encounter for general adult medical examination without abnormal findings: Secondary | ICD-10-CM

## 2013-04-12 LAB — CBC WITH DIFFERENTIAL/PLATELET
Basophils Absolute: 0 10*3/uL (ref 0.0–0.1)
Eosinophils Absolute: 0.5 10*3/uL (ref 0.0–0.7)
Lymphocytes Relative: 30 % (ref 12.0–46.0)
MCHC: 33.7 g/dL (ref 30.0–36.0)
MCV: 98.2 fl (ref 78.0–100.0)
Monocytes Absolute: 0.8 10*3/uL (ref 0.1–1.0)
Neutrophils Relative %: 54.6 % (ref 43.0–77.0)
Platelets: 259 10*3/uL (ref 150.0–400.0)
RDW: 14.1 % (ref 11.5–14.6)

## 2013-04-12 LAB — LIPID PANEL
Cholesterol: 220 mg/dL — ABNORMAL HIGH (ref 0–200)
Total CHOL/HDL Ratio: 3
Triglycerides: 108 mg/dL (ref 0.0–149.0)
VLDL: 21.6 mg/dL (ref 0.0–40.0)

## 2013-04-12 LAB — TSH: TSH: 1.72 u[IU]/mL (ref 0.35–5.50)

## 2013-04-12 LAB — LDL CHOLESTEROL, DIRECT: Direct LDL: 136.3 mg/dL

## 2013-04-12 LAB — COMPREHENSIVE METABOLIC PANEL
ALT: 21 U/L (ref 0–35)
AST: 24 U/L (ref 0–37)
Albumin: 3.6 g/dL (ref 3.5–5.2)
Alkaline Phosphatase: 46 U/L (ref 39–117)
Calcium: 9.3 mg/dL (ref 8.4–10.5)
Chloride: 104 mEq/L (ref 96–112)
Potassium: 4.7 mEq/L (ref 3.5–5.1)

## 2013-04-12 NOTE — Telephone Encounter (Signed)
Message copied by Judy Pimple on Wed Apr 12, 2013  6:20 AM ------      Message from: Alvina Chou      Created: Mon Apr 10, 2013  3:55 PM      Regarding: Lab orders for Wednesday, 9.24.14       Patient is scheduled for CPX labs, please order future labs, Thanks , Terri            Dr Timoteo Expose patient ------

## 2013-04-12 NOTE — Telephone Encounter (Signed)
Future labs 

## 2013-04-14 ENCOUNTER — Telehealth: Payer: Self-pay

## 2013-04-14 MED ORDER — LINACLOTIDE 145 MCG PO CAPS: 145.0000 ug | ORAL_CAPSULE | Freq: Every day | ORAL | Status: DC

## 2013-04-14 NOTE — Telephone Encounter (Signed)
Done

## 2013-04-14 NOTE — Telephone Encounter (Signed)
Pt called with a progress report- she said the episodes of abd pain are less severe and only happen about once a day. She feels like this is going in the right direction.  She also said she started  The linzess and had 3 days of diarrhea but the last 2 days she has had normal formed stool. She feels like it is working well and is requesting an rx sent to [facility] [place].

## 2013-04-19 ENCOUNTER — Encounter: Payer: Self-pay | Admitting: Internal Medicine

## 2013-04-19 DIAGNOSIS — M810 Age-related osteoporosis without current pathological fracture: Secondary | ICD-10-CM

## 2013-04-19 HISTORY — DX: Age-related osteoporosis without current pathological fracture: M81.0

## 2013-04-19 HISTORY — PX: OTHER SURGICAL HISTORY: SHX169

## 2013-04-20 ENCOUNTER — Ambulatory Visit (INDEPENDENT_AMBULATORY_CARE_PROVIDER_SITE_OTHER): Payer: Medicare Other | Admitting: Family Medicine

## 2013-04-20 ENCOUNTER — Encounter: Payer: Self-pay | Admitting: Family Medicine

## 2013-04-20 VITALS — BP 144/82 | HR 64 | Temp 97.8°F | Ht 66.5 in | Wt 110.0 lb

## 2013-04-20 DIAGNOSIS — M810 Age-related osteoporosis without current pathological fracture: Secondary | ICD-10-CM

## 2013-04-20 DIAGNOSIS — R1013 Epigastric pain: Secondary | ICD-10-CM

## 2013-04-20 DIAGNOSIS — Z23 Encounter for immunization: Secondary | ICD-10-CM | POA: Diagnosis not present

## 2013-04-20 DIAGNOSIS — Z639 Problem related to primary support group, unspecified: Secondary | ICD-10-CM | POA: Diagnosis not present

## 2013-04-20 DIAGNOSIS — F329 Major depressive disorder, single episode, unspecified: Secondary | ICD-10-CM

## 2013-04-20 DIAGNOSIS — E119 Type 2 diabetes mellitus without complications: Secondary | ICD-10-CM

## 2013-04-20 DIAGNOSIS — I4891 Unspecified atrial fibrillation: Secondary | ICD-10-CM

## 2013-04-20 DIAGNOSIS — Z0000 Encounter for general adult medical examination without abnormal findings: Secondary | ICD-10-CM | POA: Diagnosis not present

## 2013-04-20 DIAGNOSIS — F3289 Other specified depressive episodes: Secondary | ICD-10-CM

## 2013-04-20 DIAGNOSIS — F418 Other specified anxiety disorders: Secondary | ICD-10-CM | POA: Insufficient documentation

## 2013-04-20 MED ORDER — SERTRALINE HCL 25 MG PO TABS: 25.0000 mg | ORAL_TABLET | Freq: Every day | ORAL | Status: DC

## 2013-04-20 MED ORDER — TRAMADOL HCL 50 MG PO TABS: 50.0000 mg | ORAL_TABLET | Freq: Four times a day (QID) | ORAL | Status: DC | PRN
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
## 2013-04-20 NOTE — Assessment & Plan Note (Signed)
dexa scheduled today.  To schedule boniva injection.

## 2013-04-20 NOTE — Assessment & Plan Note (Addendum)
I have personally reviewed the Medicare Annual Wellness questionnaire and have noted 1. The patient's medical and social history 2. Their use of alcohol, tobacco or illicit drugs 3. Their current medications and supplements 4. The patient's functional ability including ADL's, fall risks, home safety risks and hearing or visual impairment. 5. Diet and physical activity 6. Evidence for depression or mood disorders The patients weight, height, BMI have been recorded in the chart.  Hearing and vision has been addressed. I have made referrals, counseling and provided education to the patient based review of the above and I have provided the pt with a written personalized care plan for preventive services. See scanned questionairre. Advanced directives discussed: brings updated copy for our records.  Reviewed preventative protocols and updated unless pt declined.  Well woman with OBGYN.  Schedules her own mammograms. UTD immunizations.

## 2013-04-20 NOTE — Assessment & Plan Note (Signed)
Chronic, stable. Continue warfarin. 

## 2013-04-20 NOTE — Assessment & Plan Note (Signed)
continue to follow with GI - pt asks about some component of psychosomatic pain from depression - discussed this as well. Continue tramadol prn.

## 2013-04-20 NOTE — Addendum Note (Signed)
Addended by: Josph Macho A on: 04/20/2013 04:00 PM   Modules accepted: Orders

## 2013-04-20 NOTE — Progress Notes (Signed)
Subjective:    Patient ID: [name], female    DOB: Jan 10, 1926, 79 y.o.   MRN: [id number]  HPI CC: medicare wellness visit  Continued abd discomfort.  Pain, bloating, phlegm in throat, occasional headache, no motivation.  Continues seeing Dr. Jena Gauss.  Tramadol resolves pain and she feels energized afterwards, then fizzles out.  Tolerates sugars without cramping. Depression - some anhedonia from lack of motivation and malaise.  Depressed mood endorsed.  Energy level ok to decreased.  Sleep decreased.  Appetite decreased.  + SI, no plan.  Wouldn't want to go through 2/2 concerned with husband. DM - checks sugars a few times a week.  Stable.  Increased sugar 2/2 above.  Wt Readings from Last 3 Encounters:  04/20/13 110 lb (49.896 kg)  04/07/13 110 lb 9.6 oz (50.168 kg)  03/23/13 109 lb 8 oz (49.669 kg)    Preventative: Here for medicare wellness visit  Last colonoscopy was 3 yrs ago (2011), rec no need to repeat. Father did die of colon cancer. GI - in [place].  Mammogram last 05/2012 WNL, schedules herself. Awaiting call from [facility]. Discussed spacing out mammograms. Well woman with Dr. Emelda Fear in [place] - done GYN every 2 yrs. Pelvic and breast exam there. Dexa - last 2011. Tscore -4.5. On boniva for the last year.   Tetanus shot - done 2006  Shingles shot - around 2011  Pneumovax - had one after age 54yo. Flu shot - today Advanced directives: has living will at home. Brought new living will.  Husband is HCPOA  Hearing screen normal.  Vision screen normal (last eye exam was 3 mo ago).   No falls in the last year. Osteoporosis on boniva. Screens positive for depression - PHQH9 = 14 --> see above.  Medications and allergies reviewed and updated in chart.  Past histories reviewed and updated if relevant as below. Patient Active Problem List   Diagnosis Date Noted  . Tick bites 03/23/2013  . Medicare annual wellness visit, initial 04/03/2012  . Epigastric  abdominal pain 02/23/2012  . Hypothyroidism   . Diabetes type 2, controlled   . Pulmonary nodules   . Anxiety   . GERD (gastroesophageal reflux disease)   . Osteoporosis   . Encounter for long-term (current) use of anticoagulants 05/18/2011  . TIA (transient ischemic attack) 05/18/2011  . Fatigue 10/13/2010  . ESSENTIAL HYPERTENSION, BENIGN 05/05/2010  . CONSTIPATION 03/14/2010  . MITRAL VALVE DISORDER 02/19/2010  . WEIGHT LOSS, ABNORMAL 10/16/2009  . OTHER DYSPHAGIA 10/16/2009  . NONSPECIFIC ABN FINDING RAD & OTH EXAM GI TRACT 10/16/2009  . Hyperlipidemia 01/11/2009  . Atrial fibrillation 06/22/2008   Past Medical History  Diagnosis Date  . History of TIA (transient ischemic attack) remote  . Hypothyroidism   . History of rheumatic fever   . Osteoporosis     Thoracic compression fracture, on bisphosphonate and cal/vit D  . Atrial fibrillation     Paroxysmal  . Mixed hyperlipidemia   . Essential hypertension, benign   . Diabetes type 2, controlled     Diet controlled  . Pulmonary nodules     Stable bilateral on CT 01/2010, rec rpt yearly for 2 yrs, pt decided to stop f/u  . Mitral valve prolapse   . Aortic stenosis   . Anemia   . Arthritis   . History of skin cancer   . Anxiety   . Diverticulosis   . GERD (gastroesophageal reflux disease)   . Glaucoma   . Seasonal allergies   .  History of pelvic fracture April 2013  . COPD (chronic obstructive pulmonary disease)   . Chronic gastritis 10/2009    Duodenitis on EGD  . Hiatal hernia 2011    Small  . Collagen vascular disease   . Myocardial infarction     undetermined age  . TIA (transient ischemic attack)   . Chronic kidney disease     hx of elevated BUN  . Cancer     skin cancer   Past Surgical History  Procedure Laterality Date  . Tonsillectomy    . Cesarean section      (and h/o 6 miscarriages)  . Breast lumpectomy  1983    LEFT, benign  . Patella fracture surgery  05/2006    LEFT surgically repaired  with pins and wire  . Dexa  05/2010    T score -4.5 spine, -2.4 femur; does not want to repeat  . Cardiovascular stress test  2010    negative  . Cataract extraction  2012    RIGHT  . Carotid US  2011    mild plaque formation  . Colonoscopy  03/2010    int hemorrhoids, diverticulosis, no need to repeat (Dr. Kendell Bane)  . Esophagogastroduodenoscopy  10/2009    small HH, multiple antric ulcerations s/p biopsy, mild chronic gastritis/duodenitis  . Esophagogastroduodenoscopy  05/26/2012    RMR: small HH, o/w normal.   . Dilation and curettage of uterus      x2  . Laparoscopic cholecystectomy  02/2013    Dr. Leticia Penna  . Cholecystectomy N/A 02/27/2013    Procedure: LAPAROSCOPIC CHOLECYSTECTOMY;  Surgeon: Fabio Bering, MD;  Location: [facility];  Service: General;  Laterality: N/A;   History  Substance Use Topics  . Smoking status: Former Smoker -- 1.00 packs/day for 20 years    Types: Cigarettes    Quit date: 10/13/1998  . Smokeless tobacco: Never Used  . Alcohol Use: No   Family History  Problem Relation Age of Onset  . Coronary artery disease Mother 20    MI deceased  . Colon cancer Father 30  . Arrhythmia Sister   . Stroke Paternal Grandmother   . Diabetes Neg Hx    Allergies  Allergen Reactions  . Propoxyphene-Acetaminophen Shortness Of Breath  . Codeine Nausea And Vomiting  . Meperidine Hcl Nausea And Vomiting  . Morphine Nausea And Vomiting  . Shellfish Allergy    Current Outpatient Prescriptions on File Prior to Visit  Medication Sig Dispense Refill  . acetaminophen (TYLENOL) 500 MG tablet Take 500 mg by mouth daily as needed. For pain      . Calcium-Magnesium-Vitamin D 500-250-200 MG-MG-UNIT TABS Take 1 tablet by mouth 2 (two) times daily.      . clonazePAM (KLONOPIN) 0.5 MG tablet TAKE 1 TABLET BY MOUTH AT BEDTIME AS NEEDED ANXIETY  30 tablet  1  . docusate sodium (COLACE) 100 MG capsule Take 100 mg by mouth at bedtime as needed for constipation.       Marland Kitchen estradiol  (ESTRACE) 0.1 MG/GM vaginal cream Place 2 g vaginally as directed.        . felodipine (PLENDIL) 5 MG 24 hr tablet TAKE 1 TABLET DAILY  90 tablet  2  . ibandronate (BONIVA) 3 MG/3ML SOLN injection Inject 3 mg into the vein every 3 (three) months.   3.6 mL    . latanoprost (XALATAN) 0.005 % ophthalmic solution Place 1 drop into both eyes at bedtime.       Marland Kitchen levothyroxine (SYNTHROID, LEVOTHROID)  50 MCG tablet Take 1 tablet (50 mcg total) by mouth daily.  90 tablet  1  . Linaclotide (LINZESS) 145 MCG CAPS capsule Take 1 capsule (145 mcg total) by mouth daily.  30 capsule  3  . Multiple Vitamin (MULTIVITAMIN) capsule Take 1 capsule by mouth every morning.       . Omega-3 Fatty Acids (FISH OIL CONCENTRATE) 1000 MG CAPS Take 2 capsules by mouth 2 (two) times daily.        . polyethylene glycol (MIRALAX / GLYCOLAX) packet Take 17 g by mouth 3 (three) times a week. As needed for constipation      . traMADol (ULTRAM) 50 MG tablet Take 1 tablet (50 mg total) by mouth every 6 (six) hours as needed for pain.  30 tablet  0  . warfarin (COUMADIN) 2.5 MG tablet Take 1 tablet (2.5 mg total) by mouth as directed.  160 tablet  3  . [DISCONTINUED] Calcium Carbonate-Vitamin D (CALCIUM PLUS VITAMIN D PO) Take 2 tablets by mouth daily.         No current facility-administered medications on file prior to visit.    Review of Systems  Constitutional: Negative for fever, chills, activity change, appetite change, fatigue and unexpected weight change.  HENT: Negative for hearing loss and neck pain.   Eyes: Negative for visual disturbance.  Respiratory: Negative for cough, chest tightness, shortness of breath and wheezing.   Cardiovascular: Negative for chest pain, palpitations and leg swelling.  Gastrointestinal: Positive for nausea, abdominal pain, diarrhea, constipation and abdominal distention. Negative for vomiting and blood in stool.  Genitourinary: Negative for hematuria and difficulty urinating.  Musculoskeletal:  Negative for myalgias and arthralgias.  Skin: Negative for rash.  Neurological: Positive for headaches. Negative for dizziness, seizures and syncope.  Hematological: Negative for adenopathy. Does not bruise/bleed easily.  Psychiatric/Behavioral: Positive for dysphoric mood. The patient is not nervous/anxious.        Objective:   Physical Exam  Nursing note and vitals reviewed. Constitutional: She is oriented to person, place, and time. She appears well-developed and well-nourished. No distress.  HENT:  Head: Normocephalic and atraumatic.  Right Ear: Hearing, tympanic membrane, external ear and ear canal normal.  Left Ear: Hearing, tympanic membrane, external ear and ear canal normal.  Nose: Nose normal.  Mouth/Throat: Oropharynx is clear and moist. No oropharyngeal exudate.  Eyes: Conjunctivae and EOM are normal. Pupils are equal, round, and reactive to light. No scleral icterus.  Neck: Normal range of motion. Neck supple. No thyromegaly present.  Cardiovascular: Normal rate, normal heart sounds and intact distal pulses.  An irregularly irregular rhythm present.  No murmur heard. Pulses:      Radial pulses are 2+ on the right side, and 2+ on the left side.  Pulmonary/Chest: Effort normal and breath sounds normal. No respiratory distress. She has no wheezes. She has no rales.  Abdominal: Soft. Normal appearance and bowel sounds are normal. She exhibits no distension and no mass. There is no hepatosplenomegaly. There is generalized tenderness (mild). There is no rigidity, no rebound, no guarding, no CVA tenderness and negative Murphy's sign.  Musculoskeletal: Normal range of motion. She exhibits no edema.  Lymphadenopathy:    She has no cervical adenopathy.  Neurological: She is alert and oriented to person, place, and time.  CN grossly intact, station and gait intact  Skin: Skin is warm and dry. No rash noted.  Psychiatric: She has a normal mood and affect. Her behavior is normal.  Judgment and thought  content normal.      Assessment & Plan:

## 2013-04-20 NOTE — Patient Instructions (Addendum)
Flu shot today. We will schedule boniva injection. We will schedule bone density scan for a new baseline on boniva. Start sertraline 25 mg daily. Return to see me in 1 month for follow up

## 2013-04-20 NOTE — Assessment & Plan Note (Signed)
PHQ9 = 14. Reviewed dx as well as treatment options. Pt prefers to hold off on counseling, may consider when returns from [place] next year. Start sertraline 25mg  daily - discussed increased suicidality on med.  Pt aware to call us immediately if any worsening SI or any plan. Anticipate some situational depression to recent GI issues, but also has significant stressors in form of family trouble.

## 2013-04-20 NOTE — Assessment & Plan Note (Signed)
Reviewed need for decreased sugar intake.

## 2013-04-24 ENCOUNTER — Ambulatory Visit (INDEPENDENT_AMBULATORY_CARE_PROVIDER_SITE_OTHER): Payer: Medicare Other | Admitting: *Deleted

## 2013-04-24 DIAGNOSIS — G459 Transient cerebral ischemic attack, unspecified: Secondary | ICD-10-CM | POA: Diagnosis not present

## 2013-04-24 DIAGNOSIS — I4891 Unspecified atrial fibrillation: Secondary | ICD-10-CM

## 2013-04-24 DIAGNOSIS — Z7901 Long term (current) use of anticoagulants: Secondary | ICD-10-CM

## 2013-04-25 ENCOUNTER — Ambulatory Visit: Payer: Medicare Other | Admitting: Internal Medicine

## 2013-04-25 ENCOUNTER — Encounter: Payer: Self-pay | Admitting: Family Medicine

## 2013-04-26 ENCOUNTER — Other Ambulatory Visit: Payer: Self-pay | Admitting: Family Medicine

## 2013-04-26 DIAGNOSIS — Z1231 Encounter for screening mammogram for malignant neoplasm of breast: Secondary | ICD-10-CM

## 2013-04-27 DIAGNOSIS — L570 Actinic keratosis: Secondary | ICD-10-CM | POA: Diagnosis not present

## 2013-04-27 DIAGNOSIS — D235 Other benign neoplasm of skin of trunk: Secondary | ICD-10-CM | POA: Diagnosis not present

## 2013-04-28 ENCOUNTER — Telehealth: Payer: Self-pay | Admitting: *Deleted

## 2013-04-28 NOTE — Telephone Encounter (Signed)
Order for Boniva in your IN box for completion. Please review and sign. Thanks!

## 2013-04-28 NOTE — Telephone Encounter (Signed)
Order given to [facility].

## 2013-04-28 NOTE — Telephone Encounter (Signed)
signed and placed in Kim's box. 

## 2013-05-04 ENCOUNTER — Telehealth: Payer: Self-pay

## 2013-05-04 ENCOUNTER — Encounter: Payer: Self-pay | Admitting: Family Medicine

## 2013-05-04 ENCOUNTER — Other Ambulatory Visit: Payer: Self-pay | Admitting: Family Medicine

## 2013-05-04 NOTE — Telephone Encounter (Signed)
Yes ok to hold off on boniva injections until after dental work completed.   Please call us when dental work is completed to reschedule boniva injection. Please cancel upcoming injection.

## 2013-05-04 NOTE — Telephone Encounter (Signed)
Pt saw dentist and has been referred to orthodontist or oral surgeon;pt not sure what type the specialist is she is seeing on 05/11/13 but dentist office advised pt to call PCP to see if Boniva infusion on 05/10/13 could be rescheduled since Boniva could effect pts gum.Please advise.

## 2013-05-05 ENCOUNTER — Ambulatory Visit (HOSPITAL_COMMUNITY)
Admission: RE | Admit: 2013-05-05 | Discharge: 2013-05-05 | Disposition: A | Discharge status: Home / Self Care | Payer: Medicare Other | Source: Ambulatory Visit | Attending: Family Medicine | Admitting: Family Medicine

## 2013-05-05 DIAGNOSIS — M818 Other osteoporosis without current pathological fracture: Secondary | ICD-10-CM | POA: Diagnosis not present

## 2013-05-05 DIAGNOSIS — Z780 Asymptomatic menopausal state: Secondary | ICD-10-CM | POA: Insufficient documentation

## 2013-05-05 DIAGNOSIS — M810 Age-related osteoporosis without current pathological fracture: Secondary | ICD-10-CM

## 2013-05-05 NOTE — Telephone Encounter (Signed)
Spoke with patient and she will call and reschedule the infusion. She was provided a contact number to do so.

## 2013-05-07 ENCOUNTER — Encounter (HOSPITAL_COMMUNITY): Payer: Self-pay

## 2013-05-08 ENCOUNTER — Ambulatory Visit (INDEPENDENT_AMBULATORY_CARE_PROVIDER_SITE_OTHER): Payer: Medicare Other | Admitting: *Deleted

## 2013-05-08 DIAGNOSIS — Z7901 Long term (current) use of anticoagulants: Secondary | ICD-10-CM | POA: Diagnosis not present

## 2013-05-08 DIAGNOSIS — I4891 Unspecified atrial fibrillation: Secondary | ICD-10-CM | POA: Diagnosis not present

## 2013-05-08 DIAGNOSIS — G459 Transient cerebral ischemic attack, unspecified: Secondary | ICD-10-CM

## 2013-05-08 LAB — POCT INR: INR: 1.9

## 2013-05-09 ENCOUNTER — Telehealth: Payer: Self-pay | Admitting: *Deleted

## 2013-05-09 NOTE — Telephone Encounter (Signed)
Pt called she is taking lincess pt would like to stop taking that, pt states it is not helping, it is giving her diahrrea, also with little lumps of hard stuff in it. Please advise [phone]

## 2013-05-09 NOTE — Telephone Encounter (Signed)
Spoke with pt- she is taking the linzess on an empty stomach and she is now having 2-3 episodes of diarrhea everyday for the last week. Pt doesn't want to take the linzess anymore. Advised pt that if she doesn't like the linzess and it is causing her to have diarrhea everyday then she should stop taking it and go back on the miralax that she was taking. Pt agreed with this and has an upcoming appt with RMR at the end of the month. Told her that I would send this to [facility] as an FYI.

## 2013-05-10 ENCOUNTER — Encounter (HOSPITAL_COMMUNITY): Payer: Medicare Other

## 2013-05-10 NOTE — Telephone Encounter (Signed)
So  Linzess has worked for constipation but we have overshot our end point. I recommend patient drop back to Linzess to 145 every other day until office visit and stop stool softener.

## 2013-05-12 ENCOUNTER — Ambulatory Visit: Payer: Medicare Other | Admitting: Internal Medicine

## 2013-05-12 NOTE — Telephone Encounter (Signed)
Pt stated she did not like the linzess and doesn't want to take it.

## 2013-05-16 ENCOUNTER — Ambulatory Visit: Payer: Medicare Other | Admitting: Internal Medicine

## 2013-05-16 ENCOUNTER — Other Ambulatory Visit: Payer: Self-pay | Admitting: Family Medicine

## 2013-05-16 NOTE — Telephone Encounter (Signed)
Patient came in for her appt with Dr. Jena Gauss and we realized her appt was mistakenly cancelled by Medical Day, Tawni Millers at [phone].  I called and spoke with Laverne she apologized for the mistake.  I spoke with Dr. Jena Gauss and see how the patient was doing and she could wait longer for another follow-up appt.  Pt stated she did have some diarrhea with the Linzess, however after stopping it the diarrhea went away.  She would like to see Dr. Genevie Cheshire week and she will [place] after that visit to reschedule her appt here.

## 2013-05-16 NOTE — Telephone Encounter (Signed)
plz phone in. 

## 2013-05-16 NOTE — Telephone Encounter (Signed)
Rx called in as directed.   

## 2013-05-16 NOTE — Telephone Encounter (Signed)
Ok to refill 

## 2013-05-17 ENCOUNTER — Encounter (HOSPITAL_COMMUNITY): Payer: Medicare Other

## 2013-05-23 ENCOUNTER — Ambulatory Visit (INDEPENDENT_AMBULATORY_CARE_PROVIDER_SITE_OTHER): Payer: Medicare Other | Admitting: Family Medicine

## 2013-05-23 ENCOUNTER — Encounter: Payer: Self-pay | Admitting: Family Medicine

## 2013-05-23 VITALS — BP 134/94 | HR 84 | Temp 98.2°F | Wt 111.0 lb

## 2013-05-23 DIAGNOSIS — F329 Major depressive disorder, single episode, unspecified: Secondary | ICD-10-CM

## 2013-05-23 DIAGNOSIS — R1900 Intra-abdominal and pelvic swelling, mass and lump, unspecified site: Secondary | ICD-10-CM

## 2013-05-23 DIAGNOSIS — M810 Age-related osteoporosis without current pathological fracture: Secondary | ICD-10-CM | POA: Diagnosis not present

## 2013-05-23 DIAGNOSIS — R634 Abnormal weight loss: Secondary | ICD-10-CM | POA: Diagnosis not present

## 2013-05-23 MED ORDER — TRAMADOL HCL 50 MG PO TABS: 50.0000 mg | ORAL_TABLET | Freq: Four times a day (QID) | ORAL | Status: DC | PRN

## 2013-05-23 MED ORDER — GLUCOSE BLOOD VI STRP: ORAL_STRIP | Status: DC

## 2013-05-23 MED ORDER — SERTRALINE HCL 25 MG PO TABS: 25.0000 mg | ORAL_TABLET | Freq: Every day | ORAL | Status: DC

## 2013-05-23 NOTE — Assessment & Plan Note (Signed)
Wt Readings from Last 3 Encounters:  05/23/13 111 lb (50.349 kg)  04/20/13 110 lb (49.896 kg)  04/07/13 110 lb 9.6 oz (50.168 kg)  weight stable.

## 2013-05-23 NOTE — Assessment & Plan Note (Signed)
?  lymph node vs nodule vs omental thickening. Will monitor for now - if enlarging or worsening pain pt will notify me for further evaluation. Reviewed latest CT angio abd/pelvis 05/2012 - stable

## 2013-05-23 NOTE — Assessment & Plan Note (Signed)
Significant improvement in mood.  PHQ9 not done today. Pt desires to continue this dose for now.

## 2013-05-23 NOTE — Assessment & Plan Note (Signed)
Reviewed recent DEXA scans - has stopped boniva injections 2/2 upcoming dental procedures, but recommended restart bisphosphonate when dental work done.

## 2013-05-23 NOTE — Patient Instructions (Signed)
Check up front how to cancel your mychart account. I'm glad mood is better! Let's continue sertraline Let's monitor that spot on right abdomen - let me know if enlarging or becoming more tender. Return to see me after [place] trip.

## 2013-05-23 NOTE — Progress Notes (Signed)
  Subjective:    Patient ID: [name], female    DOB: [birth year], 79 y.o.   MRN: [id number]  HPI CC: 1 mo f/u  Seen here last month for medicare wellness visit - concern for depression endorsed.  Started sertraline 25mg  daily.  Since started med, noticed significant improvement in mood.  "I have new zest for life".  No more SI/HI. At last visit: PHQ9 = 14.   Continued abd discomfort - episodes once daily.  Overall feels better. Has noticed lump on right abdomen when she lays on her back. linzess caused diarrhea.  Reviewed DEXA results with patient.   Past Medical History  Diagnosis Date  . History of TIA (transient ischemic attack) remote  . Hypothyroidism   . History of rheumatic fever   . Osteoporosis 04/2013    Thoracic compression fracture, on bisphosphonate and cal/vit D  . Atrial fibrillation     Paroxysmal  . Mixed hyperlipidemia   . Essential hypertension, benign   . Diabetes type 2, controlled     Diet controlled  . Pulmonary nodules     Stable bilateral on CT 01/2010, rec rpt yearly for 2 yrs, pt decided to stop f/u  . Mitral valve prolapse   . Aortic stenosis   . Anemia   . Arthritis   . History of skin cancer   . Anxiety   . Diverticulosis   . GERD (gastroesophageal reflux disease)   . Glaucoma   . Seasonal allergies   . History of pelvic fracture April 2013  . COPD (chronic obstructive pulmonary disease)   . Chronic gastritis 10/2009    Duodenitis on EGD  . Hiatal hernia 2011    Small  . Collagen vascular disease   . Myocardial infarction     undetermined age  . TIA (transient ischemic attack)   . Chronic kidney disease     hx of elevated BUN  . Cancer     skin cancer     Review of Systems Per HPI    Objective:   Physical Exam  Nursing note and vitals reviewed. Constitutional: She appears well-developed and well-nourished. No distress.  Abdominal: Soft. Normal appearance and bowel sounds are normal. She exhibits no distension and no  mass. There is no hepatosplenomegaly. There is no tenderness. There is no rigidity, no rebound, no guarding, no CVA tenderness and negative Murphy's sign.    Small palpable nodule right lateral abdomen, mild tenderness.      Assessment & Plan:

## 2013-05-26 ENCOUNTER — Ambulatory Visit (HOSPITAL_COMMUNITY)
Admission: RE | Admit: 2013-05-26 | Discharge: 2013-05-26 | Disposition: A | Discharge status: Home / Self Care | Payer: Medicare Other | Source: Ambulatory Visit | Attending: Family Medicine | Admitting: Family Medicine

## 2013-05-26 ENCOUNTER — Other Ambulatory Visit (HOSPITAL_COMMUNITY): Payer: Medicare Other

## 2013-05-26 DIAGNOSIS — Z1231 Encounter for screening mammogram for malignant neoplasm of breast: Secondary | ICD-10-CM | POA: Insufficient documentation

## 2013-05-29 ENCOUNTER — Ambulatory Visit (INDEPENDENT_AMBULATORY_CARE_PROVIDER_SITE_OTHER): Payer: Medicare Other | Admitting: *Deleted

## 2013-05-29 DIAGNOSIS — Z7901 Long term (current) use of anticoagulants: Secondary | ICD-10-CM | POA: Diagnosis not present

## 2013-05-29 DIAGNOSIS — I4891 Unspecified atrial fibrillation: Secondary | ICD-10-CM

## 2013-05-29 DIAGNOSIS — G459 Transient cerebral ischemic attack, unspecified: Secondary | ICD-10-CM | POA: Diagnosis not present

## 2013-05-29 LAB — HM MAMMOGRAPHY: HM Mammogram: NORMAL

## 2013-05-29 LAB — POCT INR: INR: 2

## 2013-05-30 ENCOUNTER — Encounter: Payer: Self-pay | Admitting: *Deleted

## 2013-05-31 ENCOUNTER — Other Ambulatory Visit (HOSPITAL_COMMUNITY): Payer: Self-pay | Admitting: *Deleted

## 2013-06-01 ENCOUNTER — Encounter (HOSPITAL_COMMUNITY)
Admission: RE | Admit: 2013-06-01 | Discharge: 2013-06-01 | Disposition: A | Discharge status: Home / Self Care | Payer: Medicare Other | Source: Ambulatory Visit | Attending: Family Medicine | Admitting: Family Medicine

## 2013-06-01 DIAGNOSIS — M810 Age-related osteoporosis without current pathological fracture: Secondary | ICD-10-CM | POA: Insufficient documentation

## 2013-06-01 MED ORDER — IBANDRONATE SODIUM 3 MG/3ML IV SOLN
3.0000 mg | Freq: Once | INTRAVENOUS | Status: AC
  Administered 2013-06-01: 3 mg via INTRAVENOUS

## 2013-06-01 MED ORDER — IBANDRONATE SODIUM 3 MG/3ML IV SOLN
INTRAVENOUS | Status: AC
  Filled 2013-06-01: qty 3

## 2013-06-01 MED ORDER — SODIUM CHLORIDE 0.9 % IV SOLN
Freq: Once | INTRAVENOUS | Status: AC
  Administered 2013-06-01: 12:00:00 via INTRAVENOUS

## 2013-06-09 ENCOUNTER — Encounter: Payer: Self-pay | Admitting: Cardiology

## 2013-06-09 ENCOUNTER — Ambulatory Visit (INDEPENDENT_AMBULATORY_CARE_PROVIDER_SITE_OTHER): Payer: Medicare Other | Admitting: Cardiology

## 2013-06-09 VITALS — BP 121/71 | HR 80 | Ht 65.0 in | Wt 111.0 lb

## 2013-06-09 DIAGNOSIS — I10 Essential (primary) hypertension: Secondary | ICD-10-CM | POA: Diagnosis not present

## 2013-06-09 DIAGNOSIS — I4891 Unspecified atrial fibrillation: Secondary | ICD-10-CM | POA: Diagnosis not present

## 2013-06-09 NOTE — Assessment & Plan Note (Signed)
Blood pressure control is good today. No changes made. 

## 2013-06-09 NOTE — Progress Notes (Signed)
Clinical Summary Ms. Shipton is an 79 y.o.female last seen in June. She reports no symptomatic palpitations, chronic stable shortness of breath. She had her gallbladder out since I last saw her. Seems to be having gradual improvement in abdominal discomfort.  Lab work from September showed potassium 4.7, BUN 11, creatinine 0.8, normal AST and ALT, hemoglobin 14.0, platelets 259, cholesterol 220, triglycerides 108, HDL 66, LDL 136, TSH 1.7.  She will be traveling to [place] with her husband for a little over 2 months in the near future.   Allergies  Allergen Reactions  . Propoxyphene-Acetaminophen Shortness Of Breath  . Codeine Nausea And Vomiting  . Meperidine Hcl Nausea And Vomiting  . Morphine Nausea And Vomiting  . Shellfish Allergy Hives    Current Outpatient Prescriptions  Medication Sig Dispense Refill  . acetaminophen (TYLENOL) 500 MG tablet Take 500 mg by mouth daily as needed. For pain      . Calcium-Magnesium-Vitamin D 500-250-200 MG-MG-UNIT TABS Take 1 tablet by mouth 2 (two) times daily.      . clonazePAM (KLONOPIN) 0.5 MG tablet TAKE 1 TABLET BY MOUTH AT BEDTIME AS NEEDED  30 tablet  1  . docusate sodium (COLACE) 100 MG capsule Take 100 mg by mouth at bedtime as needed for constipation.       Marland Kitchen estradiol (ESTRACE) 0.1 MG/GM vaginal cream Place 2 g vaginally as directed.        . felodipine (PLENDIL) 5 MG 24 hr tablet TAKE 1 TABLET DAILY  90 tablet  2  . glucose blood test strip Use as instructed--FREESTYLE LITE  100 each  3  . ibandronate (BONIVA) 3 MG/3ML SOLN injection Inject 3 mg into the vein every 3 (three) months.   3.6 mL    . latanoprost (XALATAN) 0.005 % ophthalmic solution Place 1 drop into both eyes at bedtime.       Marland Kitchen levothyroxine (SYNTHROID, LEVOTHROID) 50 MCG tablet TAKE 1 TABLET DAILY  90 tablet  3  . Linaclotide (LINZESS) 145 MCG CAPS capsule Take 1 capsule (145 mcg total) by mouth daily.  30 capsule  3  . Multiple Vitamin (MULTIVITAMIN) capsule  Take 1 capsule by mouth every morning.       . Omega-3 Fatty Acids (FISH OIL CONCENTRATE) 1000 MG CAPS Take 2 capsules by mouth 2 (two) times daily.        . polyethylene glycol (MIRALAX / GLYCOLAX) packet Take 17 g by mouth 3 (three) times a week. As needed for constipation      . sertraline (ZOLOFT) 25 MG tablet Take 1 tablet (25 mg total) by mouth daily.  90 tablet  3  . traMADol (ULTRAM) 50 MG tablet Take 1 tablet (50 mg total) by mouth every 6 (six) hours as needed.  90 tablet  0  . warfarin (COUMADIN) 2.5 MG tablet Take 1 tablet (2.5 mg total) by mouth as directed.  160 tablet  3  . [DISCONTINUED] Calcium Carbonate-Vitamin D (CALCIUM PLUS VITAMIN D PO) Take 2 tablets by mouth daily.         No current facility-administered medications for this visit.    Past Medical History  Diagnosis Date  . History of TIA (transient ischemic attack)   . Hypothyroidism   . History of rheumatic fever   . Osteoporosis 04/2013    Thoracic compression fracture, on bisphosphonate and cal/vit D  . Atrial fibrillation     Paroxysmal  . Mixed hyperlipidemia   . Essential hypertension, benign   .  Diabetes type 2, controlled     Diet controlled  . Pulmonary nodules     Stable bilateral on CT 01/2010, rec rpt yearly for 2 yrs, pt decided to stop f/u  . Aortic stenosis   . Anemia   . Arthritis   . History of skin cancer   . Anxiety   . Diverticulosis   . GERD (gastroesophageal reflux disease)   . Glaucoma   . Seasonal allergies   . History of pelvic fracture April 2013  . COPD (chronic obstructive pulmonary disease)   . Chronic gastritis 10/2009    Duodenitis on EGD  . Hiatal hernia 2011    Small  . Collagen vascular disease   . CKD (chronic kidney disease) stage 2, GFR 60-89 ml/min     Social History Ms. Karnik reports that she quit smoking about 14 years ago. Her smoking use included Cigarettes. She has a 20 pack-year smoking history. She has never used smokeless tobacco. Ms. Duan  reports that she does not drink alcohol.  Review of Systems No bleeding problems. Some unsteadiness when she gets up to walk, uses a cane. No major falls. Stable appetite. Otherwise negative.  Physical Examination Filed Vitals:   06/09/13 1335  BP: 121/71  Pulse: 80   Filed Weights   06/09/13 1335  Weight: 111 lb (50.349 kg)    Thin elderly woman in no acute distress.  HEENT: Conjunctiva and lids normal, oropharynx clear.  Neck: Supple no elevated venous pressure or carotid bruits.  Cardiac: Irregularly irregular, no pathologic systolic murmur.  Lungs: Clear to auscultation. Nonlabored breathing at rest.  Extremities: No pitting edema.    Problem List and Plan   Atrial fibrillation Permanent, continue Coumadin and observation.  Essential hypertension, benign Blood pressure control is good today. No changes made.    [name], M.D., F.A.C.C.

## 2013-06-09 NOTE — Patient Instructions (Signed)
Your physician wants you to follow-up in: 6 months  You will receive a reminder letter in the mail two months in advance. If you don't receive a letter, please call our office to schedule the follow-up appointment.  Your physician recommends that you continue on your current medications as directed. Please refer to the Current Medication list given to you today.  

## 2013-06-09 NOTE — Assessment & Plan Note (Signed)
Permanent, continue Coumadin and observation.

## 2013-06-12 ENCOUNTER — Other Ambulatory Visit: Payer: Self-pay | Admitting: *Deleted

## 2013-06-12 NOTE — Telephone Encounter (Signed)
Ok to refill? Pharmacy requests 90 day supply since patient is leaving the country for extended period of time.

## 2013-06-13 MED ORDER — CLONAZEPAM 0.5 MG PO TABS: ORAL_TABLET | ORAL | Status: DC

## 2013-06-13 NOTE — Telephone Encounter (Signed)
plz phone in. 

## 2013-06-13 NOTE — Telephone Encounter (Signed)
Rx called in as directed.   

## 2013-06-19 ENCOUNTER — Ambulatory Visit (INDEPENDENT_AMBULATORY_CARE_PROVIDER_SITE_OTHER): Payer: Medicare Other | Admitting: *Deleted

## 2013-06-19 DIAGNOSIS — I4891 Unspecified atrial fibrillation: Secondary | ICD-10-CM | POA: Diagnosis not present

## 2013-06-19 DIAGNOSIS — G459 Transient cerebral ischemic attack, unspecified: Secondary | ICD-10-CM

## 2013-06-19 DIAGNOSIS — Z7901 Long term (current) use of anticoagulants: Secondary | ICD-10-CM | POA: Diagnosis not present

## 2013-06-19 MED ORDER — WARFARIN SODIUM 2.5 MG PO TABS: ORAL_TABLET | ORAL | Status: DC

## 2013-07-17 ENCOUNTER — Ambulatory Visit (INDEPENDENT_AMBULATORY_CARE_PROVIDER_SITE_OTHER): Payer: Medicare Other | Admitting: *Deleted

## 2013-07-17 DIAGNOSIS — Z7901 Long term (current) use of anticoagulants: Secondary | ICD-10-CM | POA: Diagnosis not present

## 2013-07-17 DIAGNOSIS — Z5181 Encounter for therapeutic drug level monitoring: Secondary | ICD-10-CM | POA: Diagnosis not present

## 2013-07-17 DIAGNOSIS — I4891 Unspecified atrial fibrillation: Secondary | ICD-10-CM

## 2013-07-17 DIAGNOSIS — G459 Transient cerebral ischemic attack, unspecified: Secondary | ICD-10-CM

## 2013-07-17 LAB — PROTIME-INR: INR: 1.5 — AB (ref 0.9–1.1)

## 2013-07-31 ENCOUNTER — Telehealth: Payer: Self-pay | Admitting: *Deleted

## 2013-07-31 LAB — POCT INR: INR: 2.97

## 2013-07-31 NOTE — Telephone Encounter (Signed)
Patient called and is in [place].  She called to give her INR as 2.97.  Patient states she will remain on same dose and is due to be checked in 2 wks.  Her number is [phone], international number [phone] or [phone] she was not sure.  Patient will call back in 2 wks.

## 2013-08-01 ENCOUNTER — Ambulatory Visit (INDEPENDENT_AMBULATORY_CARE_PROVIDER_SITE_OTHER): Payer: Medicare Other | Admitting: *Deleted

## 2013-08-01 DIAGNOSIS — Z7901 Long term (current) use of anticoagulants: Secondary | ICD-10-CM

## 2013-08-01 DIAGNOSIS — G459 Transient cerebral ischemic attack, unspecified: Secondary | ICD-10-CM

## 2013-08-01 DIAGNOSIS — I4891 Unspecified atrial fibrillation: Secondary | ICD-10-CM

## 2013-08-14 ENCOUNTER — Ambulatory Visit (INDEPENDENT_AMBULATORY_CARE_PROVIDER_SITE_OTHER): Payer: Medicare Other | Admitting: *Deleted

## 2013-08-14 ENCOUNTER — Telehealth: Payer: Self-pay | Admitting: *Deleted

## 2013-08-14 DIAGNOSIS — Z5181 Encounter for therapeutic drug level monitoring: Secondary | ICD-10-CM

## 2013-08-14 DIAGNOSIS — I4891 Unspecified atrial fibrillation: Secondary | ICD-10-CM

## 2013-08-14 DIAGNOSIS — G459 Transient cerebral ischemic attack, unspecified: Secondary | ICD-10-CM

## 2013-08-14 DIAGNOSIS — Z515 Encounter for palliative care: Secondary | ICD-10-CM | POA: Insufficient documentation

## 2013-08-14 DIAGNOSIS — Z7901 Long term (current) use of anticoagulants: Secondary | ICD-10-CM

## 2013-08-14 LAB — POCT INR: INR: 2.8

## 2013-08-14 NOTE — Telephone Encounter (Signed)
INR - 2.84 / patient states that she is going to continue her current dose. / tgs

## 2013-09-13 ENCOUNTER — Other Ambulatory Visit: Payer: Self-pay | Admitting: Family Medicine

## 2013-09-13 NOTE — Telephone Encounter (Signed)
Phoned in to pharmacy. 

## 2013-09-13 NOTE — Telephone Encounter (Signed)
plz phone in. 

## 2013-09-13 NOTE — Telephone Encounter (Signed)
Ok to refill 

## 2013-09-15 ENCOUNTER — Other Ambulatory Visit: Payer: Self-pay | Admitting: Cardiology

## 2013-09-15 NOTE — Telephone Encounter (Signed)
rx sent to pharmacy by e-script  

## 2013-09-22 ENCOUNTER — Ambulatory Visit (INDEPENDENT_AMBULATORY_CARE_PROVIDER_SITE_OTHER): Payer: Medicare Other | Admitting: Family Medicine

## 2013-09-22 ENCOUNTER — Encounter: Payer: Self-pay | Admitting: Family Medicine

## 2013-09-22 VITALS — BP 132/72 | HR 88 | Temp 97.4°F | Wt 117.2 lb

## 2013-09-22 DIAGNOSIS — H409 Unspecified glaucoma: Secondary | ICD-10-CM | POA: Diagnosis not present

## 2013-09-22 DIAGNOSIS — D492 Neoplasm of unspecified behavior of bone, soft tissue, and skin: Secondary | ICD-10-CM

## 2013-09-22 DIAGNOSIS — H3530 Unspecified macular degeneration: Secondary | ICD-10-CM | POA: Diagnosis not present

## 2013-09-22 DIAGNOSIS — R21 Rash and other nonspecific skin eruption: Secondary | ICD-10-CM | POA: Insufficient documentation

## 2013-09-22 DIAGNOSIS — M810 Age-related osteoporosis without current pathological fracture: Secondary | ICD-10-CM | POA: Diagnosis not present

## 2013-09-22 NOTE — Progress Notes (Signed)
BP 132/72  Pulse 88  Temp(Src) 97.4 F (36.3 C) (Oral)  Wt 117 lb 4 oz (53.184 kg)  SpO2 95%   CC: hand growth  Subjective:    Patient ID: [name], female    DOB: 12/27/1925, 80 y.o.   MRN: [id number]  HPI: [name] is a 80 y.o. female presenting on 09/22/2013 with Hand Pain   Recent return from [place], [place] for their yearly visit.  Growth on R hand - noticed 4-5 wks ago.  Started as pustule, has continued to grow.  No pain or redness or warmth. No fevers/chills, other skin growths or rashes, joint pains.  Treated with warm soaks and abx cream - has not cleared with this.  Also requests referral to see ophtho - Dr. Lanell Matar  Due for boniva injection - hesitance 2/2 possible planned dental work.  Relevant past medical, surgical, family and social history reviewed and updated as indicated.  Allergies and medications reviewed and updated. Current Outpatient Prescriptions on File Prior to Visit  Medication Sig  . acetaminophen (TYLENOL) 500 MG tablet Take 500 mg by mouth daily as needed. For pain  . Calcium-Magnesium-Vitamin D [phone] MG-MG-UNIT TABS Take 1 tablet by mouth 2 (two) times daily.  . clonazePAM (KLONOPIN) 0.5 MG tablet TAKE 1 TABLET BY MOUTH AT BEDTIME AS NEEDED  . docusate sodium (COLACE) 100 MG capsule Take 100 mg by mouth at bedtime as needed for constipation.   Marland Kitchen estradiol (ESTRACE) 0.1 MG/GM vaginal cream Place 2 g vaginally as directed.    . felodipine (PLENDIL) 5 MG 24 hr tablet TAKE 1 TABLET DAILY  . glucose blood test strip Use as instructed--FREESTYLE LITE  . ibandronate (BONIVA) 3 MG/3ML SOLN injection Inject 3 mg into the vein every 3 (three) months.   . latanoprost (XALATAN) 0.005 % ophthalmic solution Place 1 drop into both eyes at bedtime.   Marland Kitchen levothyroxine (SYNTHROID, LEVOTHROID) 50 MCG tablet TAKE 1 TABLET DAILY  . Multiple Vitamin (MULTIVITAMIN) capsule Take 1 capsule by mouth every morning.   . Omega-3 Fatty Acids  (FISH OIL CONCENTRATE) 1000 MG CAPS Take 2 capsules by mouth 2 (two) times daily.    . polyethylene glycol (MIRALAX / GLYCOLAX) packet Take 17 g by mouth 3 (three) times a week. As needed for constipation  . sertraline (ZOLOFT) 25 MG tablet Take 1 tablet (25 mg total) by mouth daily.  . traMADol (ULTRAM) 50 MG tablet TAKE 1 TABLET BY MOUTH EVERY 6 HOURS AS NEEDED  . warfarin (COUMADIN) 2.5 MG tablet Take 2 tablets daily except 1 tablet on Wednesdays and Saturdays  . [DISCONTINUED] Calcium Carbonate-Vitamin D (CALCIUM PLUS VITAMIN D PO) Take 2 tablets by mouth daily.     No current facility-administered medications on file prior to visit.    Review of Systems Per HPI unless specifically indicated above    Objective:    BP 132/72  Pulse 88  Temp(Src) 97.4 F (36.3 C) (Oral)  Wt 117 lb 4 oz (53.184 kg)  SpO2 95%  Physical Exam  Nursing note and vitals reviewed. Constitutional: She appears well-developed and well-nourished. No distress.  Skin:  R dorsal medial hand with nodular growth with medial opening with dry white scaled scab       Assessment & Plan:  Referred to Dr. Venetia Maxon per pt request to follow her glaucoma and macular degeneration.  Problem List Items Addressed This Visit   Abnormal skin growth - Primary     ?keratoacanthoma type lesion, doubt epidermal  inclusion cyst given firmness of lesion. Will refer to derm for eval/excision    Relevant Orders      Ambulatory referral to Dermatology   Osteoporosis     Pt hesitant to restart boniva injections 2/2 upcoming dental work.  I asked her to check with dentist re their comfort level with prolia injections.  If still hesitance, consider forteo vs SERM.     Other Visit Diagnoses   Glaucoma        Relevant Orders       Ambulatory referral to Ophthalmology    Macular degeneration        Relevant Orders       Ambulatory referral to Ophthalmology        Follow up plan: Return if symptoms worsen or fail to  improve.

## 2013-09-22 NOTE — Progress Notes (Signed)
Pre visit review using our clinic review tool, if applicable. No additional management support is needed unless otherwise documented below in the visit note. 

## 2013-09-22 NOTE — Patient Instructions (Addendum)
I recommend setting you up with skin doctor for removal of skin growth. We will call you schedule this in next few weeks.  We will also refer you to Dr. Venetia Maxon your eye doctor. Check with dentist on their recommendations for prolia (denosumab) instead of boniva.

## 2013-09-22 NOTE — Assessment & Plan Note (Addendum)
Pt hesitant to restart boniva injections 2/2 upcoming dental work.  I asked her to check with dentist re their comfort level with prolia injections.  If still hesitance, consider forteo vs SERM.

## 2013-09-22 NOTE — Assessment & Plan Note (Signed)
?  keratoacanthoma type lesion, doubt epidermal inclusion cyst given firmness of lesion. Will refer to derm for eval/excision

## 2013-09-27 ENCOUNTER — Ambulatory Visit (INDEPENDENT_AMBULATORY_CARE_PROVIDER_SITE_OTHER): Payer: Medicare Other | Admitting: *Deleted

## 2013-09-27 DIAGNOSIS — G459 Transient cerebral ischemic attack, unspecified: Secondary | ICD-10-CM | POA: Diagnosis not present

## 2013-09-27 DIAGNOSIS — Z7901 Long term (current) use of anticoagulants: Secondary | ICD-10-CM | POA: Diagnosis not present

## 2013-09-27 DIAGNOSIS — Z5181 Encounter for therapeutic drug level monitoring: Secondary | ICD-10-CM | POA: Diagnosis not present

## 2013-09-27 DIAGNOSIS — I4891 Unspecified atrial fibrillation: Secondary | ICD-10-CM | POA: Diagnosis not present

## 2013-09-27 LAB — POCT INR: INR: 3.2

## 2013-09-28 DIAGNOSIS — H4011X0 Primary open-angle glaucoma, stage unspecified: Secondary | ICD-10-CM | POA: Diagnosis not present

## 2013-09-28 DIAGNOSIS — H3560 Retinal hemorrhage, unspecified eye: Secondary | ICD-10-CM | POA: Diagnosis not present

## 2013-10-03 DIAGNOSIS — H3560 Retinal hemorrhage, unspecified eye: Secondary | ICD-10-CM | POA: Diagnosis not present

## 2013-10-03 DIAGNOSIS — H353190 Nonexudative age-related macular degeneration, unspecified eye, stage unspecified: Secondary | ICD-10-CM | POA: Diagnosis not present

## 2013-10-03 DIAGNOSIS — H353290 Exudative age-related macular degeneration, unspecified eye, stage unspecified: Secondary | ICD-10-CM | POA: Diagnosis not present

## 2013-10-04 ENCOUNTER — Encounter: Payer: Self-pay | Admitting: Family Medicine

## 2013-10-18 ENCOUNTER — Ambulatory Visit (INDEPENDENT_AMBULATORY_CARE_PROVIDER_SITE_OTHER): Payer: Medicare Other | Admitting: *Deleted

## 2013-10-18 ENCOUNTER — Telehealth: Payer: Self-pay | Admitting: *Deleted

## 2013-10-18 ENCOUNTER — Other Ambulatory Visit: Payer: Self-pay | Admitting: *Deleted

## 2013-10-18 DIAGNOSIS — Z5181 Encounter for therapeutic drug level monitoring: Secondary | ICD-10-CM

## 2013-10-18 DIAGNOSIS — I4891 Unspecified atrial fibrillation: Secondary | ICD-10-CM

## 2013-10-18 DIAGNOSIS — Z7901 Long term (current) use of anticoagulants: Secondary | ICD-10-CM

## 2013-10-18 DIAGNOSIS — G459 Transient cerebral ischemic attack, unspecified: Secondary | ICD-10-CM

## 2013-10-18 LAB — POCT INR: INR: 2.7

## 2013-10-18 MED ORDER — WARFARIN SODIUM 2.5 MG PO TABS: ORAL_TABLET | ORAL | Status: DC

## 2013-10-18 NOTE — Telephone Encounter (Signed)
Warfarin 2.5 mg #90 with 4 refills   Form in basket, needs signature

## 2013-10-18 NOTE — Telephone Encounter (Signed)
Filled today by [facility] LPN

## 2013-10-24 ENCOUNTER — Telehealth: Payer: Self-pay | Admitting: Family Medicine

## 2013-10-24 NOTE — Telephone Encounter (Signed)
Patient is now ready to have her Boniva Infusion at [facility]. She couldn't have it due to dental work, and she is finished with it now. Please write up the order and call the [facility] to get the patient set up. Please call the patient when the order is completed. Order is in your in box.

## 2013-10-24 NOTE — Telephone Encounter (Signed)
Filled and placed in my out box to return to [place].

## 2013-10-25 ENCOUNTER — Other Ambulatory Visit: Payer: Self-pay

## 2013-10-25 DIAGNOSIS — D485 Neoplasm of uncertain behavior of skin: Secondary | ICD-10-CM | POA: Diagnosis not present

## 2013-10-25 DIAGNOSIS — C44621 Squamous cell carcinoma of skin of unspecified upper limb, including shoulder: Secondary | ICD-10-CM | POA: Diagnosis not present

## 2013-10-25 NOTE — Telephone Encounter (Signed)
Patient scheduled at [facility] [place]

## 2013-10-27 ENCOUNTER — Other Ambulatory Visit (HOSPITAL_COMMUNITY): Payer: Self-pay | Admitting: *Deleted

## 2013-10-30 ENCOUNTER — Encounter (HOSPITAL_COMMUNITY)
Admission: RE | Admit: 2013-10-30 | Discharge: 2013-10-30 | Disposition: A | Discharge status: Home / Self Care | Payer: Medicare Other | Source: Ambulatory Visit | Attending: Family Medicine | Admitting: Family Medicine

## 2013-10-30 DIAGNOSIS — Z5181 Encounter for therapeutic drug level monitoring: Secondary | ICD-10-CM | POA: Insufficient documentation

## 2013-10-30 DIAGNOSIS — M810 Age-related osteoporosis without current pathological fracture: Secondary | ICD-10-CM | POA: Insufficient documentation

## 2013-10-30 MED ORDER — IBANDRONATE SODIUM 3 MG/3ML IV SOLN
3.0000 mg | Freq: Once | INTRAVENOUS | Status: AC
  Administered 2013-10-30: 3 mg via INTRAVENOUS

## 2013-10-30 MED ORDER — SODIUM CHLORIDE 0.9 % IV SOLN
Freq: Once | INTRAVENOUS | Status: AC
  Administered 2013-10-30: 13:00:00 via INTRAVENOUS

## 2013-10-30 MED ORDER — IBANDRONATE SODIUM 3 MG/3ML IV SOLN
INTRAVENOUS | Status: AC
  Filled 2013-10-30: qty 3

## 2013-11-08 DIAGNOSIS — H353290 Exudative age-related macular degeneration, unspecified eye, stage unspecified: Secondary | ICD-10-CM | POA: Diagnosis not present

## 2013-11-08 DIAGNOSIS — H35059 Retinal neovascularization, unspecified, unspecified eye: Secondary | ICD-10-CM | POA: Diagnosis not present

## 2013-11-15 ENCOUNTER — Ambulatory Visit (INDEPENDENT_AMBULATORY_CARE_PROVIDER_SITE_OTHER): Payer: Medicare Other | Admitting: *Deleted

## 2013-11-15 ENCOUNTER — Telehealth: Payer: Self-pay | Admitting: *Deleted

## 2013-11-15 DIAGNOSIS — Z5181 Encounter for therapeutic drug level monitoring: Secondary | ICD-10-CM | POA: Diagnosis not present

## 2013-11-15 DIAGNOSIS — Z7901 Long term (current) use of anticoagulants: Secondary | ICD-10-CM | POA: Diagnosis not present

## 2013-11-15 DIAGNOSIS — G459 Transient cerebral ischemic attack, unspecified: Secondary | ICD-10-CM | POA: Diagnosis not present

## 2013-11-15 DIAGNOSIS — I4891 Unspecified atrial fibrillation: Secondary | ICD-10-CM

## 2013-11-15 LAB — POCT INR: INR: 3

## 2013-11-15 NOTE — Telephone Encounter (Signed)
Pt is having a hard time getting [facility]. She wants to know if we can help. [facility] to [facility] and they still have three refills for her [facility]farin so [facility] not [facility] to [phone]. Please call pt to verify it was her warfarin. She was giving me so much detail I might have it wrong and was not able to get her by phone to verify. Thanks

## 2013-11-22 MED ORDER — WARFARIN SODIUM 2.5 MG PO TABS: ORAL_TABLET | ORAL | Status: DC

## 2013-11-22 NOTE — Telephone Encounter (Signed)
Coumadin RX 30 day supply sent to [facility] per pt request so she can pay cash price x 1 month till insurance will pay for next refill.

## 2013-11-22 NOTE — Addendum Note (Signed)
Addended by: Malen Gauze on: 11/22/2013 08:24 AM   Modules accepted: Orders

## 2013-11-25 ENCOUNTER — Other Ambulatory Visit: Payer: Self-pay | Admitting: Adult Health

## 2013-11-27 ENCOUNTER — Ambulatory Visit (INDEPENDENT_AMBULATORY_CARE_PROVIDER_SITE_OTHER): Payer: Medicare Other | Admitting: Cardiology

## 2013-11-27 ENCOUNTER — Encounter: Payer: Self-pay | Admitting: Cardiology

## 2013-11-27 VITALS — BP 118/71 | HR 76 | Ht 66.0 in | Wt 115.0 lb

## 2013-11-27 DIAGNOSIS — I4891 Unspecified atrial fibrillation: Secondary | ICD-10-CM | POA: Diagnosis not present

## 2013-11-27 DIAGNOSIS — I10 Essential (primary) hypertension: Secondary | ICD-10-CM

## 2013-11-27 NOTE — Patient Instructions (Signed)
Your physician wants you to follow-up in: 6 months You will receive a reminder letter in the mail two months in advance. If you don't receive a letter, please call our office to schedule the follow-up appointment.     Your physician recommends that you continue on your current medications as directed. Please refer to the Current Medication list given to you today.      Thank you for choosing [facility] !        

## 2013-11-27 NOTE — Assessment & Plan Note (Signed)
Permanent, continue Coumadin and observation. Symptomatically well controlled. ECG stable.

## 2013-11-27 NOTE — Progress Notes (Signed)
Clinical Summary [name] is an 80 y.o.female last seen in November 2014. She presents without specific complaints of progressive palpitations. She has occasional chest pain and uses her husband's nitroglycerin spray. This has not been a prominent symptom, and she remains active, walks 2 miles most days of the week, also works in her garden.  ECG today shows rate-controlled atrial fibrillation. She reports no spontaneous bleeding problems on Coumadin.   Allergies  Allergen Reactions  . Propoxyphene N-Acetaminophen Shortness Of Breath  . Codeine Nausea And Vomiting  . Meperidine Hcl Nausea And Vomiting  . Morphine Nausea And Vomiting  . Shellfish Allergy Hives    Current Outpatient Prescriptions  Medication Sig Dispense Refill  . acetaminophen (TYLENOL) 500 MG tablet Take 500 mg by mouth daily as needed. For pain      . brimonidine (ALPHAGAN P) 0.1 % SOLN       . Calcium-Magnesium-Vitamin D [phone] MG-MG-UNIT TABS Take 1 tablet by mouth 2 (two) times daily.      . clonazePAM (KLONOPIN) 0.5 MG tablet TAKE 1 TABLET BY MOUTH AT BEDTIME AS NEEDED  90 tablet  0  . docusate sodium (COLACE) 100 MG capsule Take 100 mg by mouth at bedtime as needed for constipation.       Marland Kitchen estradiol (ESTRACE) 0.1 MG/GM vaginal cream Place 2 g vaginally as directed.        . felodipine (PLENDIL) 5 MG 24 hr tablet TAKE 1 TABLET DAILY  90 tablet  2  . glucose blood test strip Use as instructed--FREESTYLE LITE  100 each  3  . ibandronate (BONIVA) 3 MG/3ML SOLN injection Inject 3 mg into the vein every 3 (three) months.   3.6 mL    . latanoprost (XALATAN) 0.005 % ophthalmic solution Place 1 drop into both eyes at bedtime.       Marland Kitchen levothyroxine (SYNTHROID, LEVOTHROID) 50 MCG tablet TAKE 1 TABLET DAILY  90 tablet  3  . Multiple Vitamin (MULTIVITAMIN) capsule Take 1 capsule by mouth every morning.       . Omega-3 Fatty Acids (FISH OIL CONCENTRATE) 1000 MG CAPS Take 2 capsules by mouth 2 (two) times daily.         . polyethylene glycol (MIRALAX / GLYCOLAX) packet Take 17 g by mouth 3 (three) times a week. As needed for constipation      . sertraline (ZOLOFT) 25 MG tablet Take 1 tablet (25 mg total) by mouth daily.  90 tablet  3  . traMADol (ULTRAM) 50 MG tablet TAKE 1 TABLET BY MOUTH EVERY 6 HOURS AS NEEDED  90 tablet  0  . warfarin (COUMADIN) 2.5 MG tablet Take 2 tablets daily except 1 tablet on Wednesdays and Saturdays  48 tablet  0  . [DISCONTINUED] Calcium Carbonate-Vitamin D (CALCIUM PLUS VITAMIN D PO) Take 2 tablets by mouth daily.         No current facility-administered medications for this visit.    Past Medical History  Diagnosis Date  . History of TIA (transient ischemic attack)   . Hypothyroidism   . History of rheumatic fever   . Osteoporosis 04/2013    Thoracic compression fracture, on bisphosphonate and cal/vit D  . Atrial fibrillation     Paroxysmal  . Mixed hyperlipidemia   . Essential hypertension, benign   . Diabetes type 2, controlled     Diet controlled  . Pulmonary nodules     Stable bilateral on CT 01/2010, rec rpt yearly for 2 yrs, pt  decided to stop f/u  . Aortic stenosis   . Anemia   . Arthritis   . History of skin cancer   . Anxiety   . Diverticulosis   . GERD (gastroesophageal reflux disease)   . Seasonal allergies   . History of pelvic fracture April 2013  . COPD (chronic obstructive pulmonary disease)   . Chronic gastritis 10/2009    Duodenitis on EGD  . Hiatal hernia 2011    Small  . Collagen vascular disease   . CKD (chronic kidney disease) stage 2, GFR 60-89 ml/min   . Glaucoma     Dr. Venetia Maxon  . Macular degeneration     Retinal hemorrhage (09/2013)    Social History Ms. Shrewsbury reports that she quit smoking about 15 years ago. Her smoking use included Cigarettes. She has a 20 pack-year smoking history. She has never used smokeless tobacco. Ms. Dancy reports that she does not drink alcohol.  Review of Systems As outlined  above.  Physical Examination Filed Vitals:   11/27/13 1636  BP: 118/71  Pulse: 76   Filed Weights   11/27/13 1636  Weight: 115 lb (52.164 kg)    Thin elderly woman in no acute distress.  HEENT: Conjunctiva and lids normal, oropharynx clear.  Neck: Supple no elevated venous pressure or carotid bruits.  Cardiac: Irregularly irregular, no pathologic systolic murmur.  Lungs: Clear to auscultation. Nonlabored breathing at rest.  Extremities: No pitting edema.    Problem List and Plan   Atrial fibrillation Permanent, continue Coumadin and observation. Symptomatically well controlled. ECG stable.  Essential hypertension, benign Blood pressure is normal today.    Satira Sark, M.D., F.A.C.C.

## 2013-11-27 NOTE — Assessment & Plan Note (Signed)
Blood pressure is normal today. 

## 2013-11-29 DIAGNOSIS — R350 Frequency of micturition: Secondary | ICD-10-CM | POA: Diagnosis not present

## 2013-11-29 DIAGNOSIS — M795 Residual foreign body in soft tissue: Secondary | ICD-10-CM | POA: Diagnosis not present

## 2013-11-30 ENCOUNTER — Emergency Department (HOSPITAL_COMMUNITY): Payer: Medicare Other

## 2013-11-30 ENCOUNTER — Encounter (HOSPITAL_COMMUNITY): Payer: Self-pay | Admitting: Emergency Medicine

## 2013-11-30 ENCOUNTER — Emergency Department (HOSPITAL_COMMUNITY)
Admission: EM | Admit: 2013-11-30 | Discharge: 2013-12-01 | Disposition: A | Discharge status: Home / Self Care | Payer: Medicare Other | Attending: Emergency Medicine | Admitting: Emergency Medicine

## 2013-11-30 DIAGNOSIS — Z8719 Personal history of other diseases of the digestive system: Secondary | ICD-10-CM | POA: Diagnosis not present

## 2013-11-30 DIAGNOSIS — W07XXXA Fall from chair, initial encounter: Secondary | ICD-10-CM | POA: Insufficient documentation

## 2013-11-30 DIAGNOSIS — F411 Generalized anxiety disorder: Secondary | ICD-10-CM | POA: Insufficient documentation

## 2013-11-30 DIAGNOSIS — Z23 Encounter for immunization: Secondary | ICD-10-CM | POA: Insufficient documentation

## 2013-11-30 DIAGNOSIS — Z7901 Long term (current) use of anticoagulants: Secondary | ICD-10-CM | POA: Diagnosis not present

## 2013-11-30 DIAGNOSIS — Z8673 Personal history of transient ischemic attack (TIA), and cerebral infarction without residual deficits: Secondary | ICD-10-CM | POA: Insufficient documentation

## 2013-11-30 DIAGNOSIS — Z87891 Personal history of nicotine dependence: Secondary | ICD-10-CM | POA: Diagnosis not present

## 2013-11-30 DIAGNOSIS — J449 Chronic obstructive pulmonary disease, unspecified: Secondary | ICD-10-CM | POA: Insufficient documentation

## 2013-11-30 DIAGNOSIS — IMO0002 Reserved for concepts with insufficient information to code with codable children: Secondary | ICD-10-CM | POA: Diagnosis not present

## 2013-11-30 DIAGNOSIS — I4891 Unspecified atrial fibrillation: Secondary | ICD-10-CM | POA: Insufficient documentation

## 2013-11-30 DIAGNOSIS — S298XXA Other specified injuries of thorax, initial encounter: Secondary | ICD-10-CM | POA: Diagnosis not present

## 2013-11-30 DIAGNOSIS — Z85828 Personal history of other malignant neoplasm of skin: Secondary | ICD-10-CM | POA: Diagnosis not present

## 2013-11-30 DIAGNOSIS — M810 Age-related osteoporosis without current pathological fracture: Secondary | ICD-10-CM | POA: Insufficient documentation

## 2013-11-30 DIAGNOSIS — M129 Arthropathy, unspecified: Secondary | ICD-10-CM | POA: Diagnosis not present

## 2013-11-30 DIAGNOSIS — I10 Essential (primary) hypertension: Secondary | ICD-10-CM | POA: Diagnosis not present

## 2013-11-30 DIAGNOSIS — H4011X0 Primary open-angle glaucoma, stage unspecified: Secondary | ICD-10-CM | POA: Diagnosis not present

## 2013-11-30 DIAGNOSIS — H409 Unspecified glaucoma: Secondary | ICD-10-CM | POA: Diagnosis not present

## 2013-11-30 DIAGNOSIS — E119 Type 2 diabetes mellitus without complications: Secondary | ICD-10-CM | POA: Diagnosis not present

## 2013-11-30 DIAGNOSIS — Z79899 Other long term (current) drug therapy: Secondary | ICD-10-CM | POA: Diagnosis not present

## 2013-11-30 DIAGNOSIS — I129 Hypertensive chronic kidney disease with stage 1 through stage 4 chronic kidney disease, or unspecified chronic kidney disease: Secondary | ICD-10-CM | POA: Insufficient documentation

## 2013-11-30 DIAGNOSIS — S5000XA Contusion of unspecified elbow, initial encounter: Secondary | ICD-10-CM | POA: Diagnosis not present

## 2013-11-30 DIAGNOSIS — W19XXXA Unspecified fall, initial encounter: Secondary | ICD-10-CM

## 2013-11-30 DIAGNOSIS — S50312A Abrasion of left elbow, initial encounter: Secondary | ICD-10-CM

## 2013-11-30 DIAGNOSIS — N182 Chronic kidney disease, stage 2 (mild): Secondary | ICD-10-CM | POA: Diagnosis not present

## 2013-11-30 DIAGNOSIS — E039 Hypothyroidism, unspecified: Secondary | ICD-10-CM | POA: Diagnosis not present

## 2013-11-30 DIAGNOSIS — Y929 Unspecified place or not applicable: Secondary | ICD-10-CM | POA: Insufficient documentation

## 2013-11-30 DIAGNOSIS — J4489 Other specified chronic obstructive pulmonary disease: Secondary | ICD-10-CM | POA: Insufficient documentation

## 2013-11-30 DIAGNOSIS — Y9389 Activity, other specified: Secondary | ICD-10-CM | POA: Insufficient documentation

## 2013-11-30 DIAGNOSIS — Z862 Personal history of diseases of the blood and blood-forming organs and certain disorders involving the immune mechanism: Secondary | ICD-10-CM | POA: Insufficient documentation

## 2013-11-30 DIAGNOSIS — R079 Chest pain, unspecified: Secondary | ICD-10-CM | POA: Diagnosis not present

## 2013-11-30 DIAGNOSIS — R0789 Other chest pain: Secondary | ICD-10-CM

## 2013-11-30 NOTE — ED Notes (Signed)
Patient states was standing on a chair and fell on left ribs.  Patient states she is on Coumadin and thought she needed to be checked out.

## 2013-12-01 MED ORDER — TETANUS-DIPHTH-ACELL PERTUSSIS 5-2.5-18.5 LF-MCG/0.5 IM SUSP
0.5000 mL | Freq: Once | INTRAMUSCULAR | Status: AC
  Administered 2013-12-01: 0.5 mL via INTRAMUSCULAR
  Filled 2013-12-01: qty 0.5

## 2013-12-01 MED ORDER — HYDROCODONE-ACETAMINOPHEN 5-325 MG PO TABS
1.0000 | ORAL_TABLET | Freq: Once | ORAL | Status: AC
Start: 2013-12-01 — End: 2013-12-01
  Administered 2013-12-01: 1 via ORAL
  Filled 2013-12-01: qty 1

## 2013-12-01 MED ORDER — HYDROCODONE-ACETAMINOPHEN 5-325 MG PO TABS: 1.0000 | ORAL_TABLET | Freq: Four times a day (QID) | ORAL | Status: DC | PRN

## 2013-12-01 NOTE — ED Provider Notes (Signed)
CSN: [id number]     Arrival date & time 11/30/13  2224 History  This chart was scribed for Sharyon Cable, MD by Rolanda Lundborg, ED Scribe. This patient was seen in room [facility]10/[facility]10 and the patient's care was started at 12:14 AM.    Chief Complaint  Patient presents with  . Fall   Patient is a 80 y.o. female presenting with fall. The history is provided by the patient. No language interpreter was used.  Fall This is a new problem. The current episode started 1 to 2 hours ago. The problem has not changed since onset.Pertinent negatives include no abdominal pain and no headaches. The symptoms are aggravated by bending. Nothing relieves the symptoms.   HPI Comments: [name] is a 80 y.o. female who presents to the Emergency Department complaining of falling off of a chair while standing on it tonight and losing her balance. She denies dizziness before the fall. She denies hitting her head or LOC. She reports an abrasion to the left elbow and rib pain. She denies neck or back pain, headache, dizziness, abdominal pain. She is on coumadin.  Past Medical History  Diagnosis Date  . History of TIA (transient ischemic attack)   . Hypothyroidism   . History of rheumatic fever   . Osteoporosis 04/2013    Thoracic compression fracture, on bisphosphonate and cal/vit D  . Atrial fibrillation     Paroxysmal on coumadin  . Mixed hyperlipidemia   . Essential hypertension, benign   . Diabetes type 2, controlled     Diet controlled  . Pulmonary nodules     Stable bilateral on CT 01/2010, rec rpt yearly for 2 yrs, pt decided to stop f/u  . Aortic stenosis   . Anemia   . Arthritis   . History of skin cancer   . Anxiety   . Diverticulosis   . GERD (gastroesophageal reflux disease)   . Seasonal allergies   . History of pelvic fracture April 2013  . COPD (chronic obstructive pulmonary disease)   . Chronic gastritis 10/2009    Duodenitis on EGD  . Hiatal hernia 2011    Small  . Collagen  vascular disease   . CKD (chronic kidney disease) stage 2, GFR 60-89 ml/min   . Glaucoma     Dr. Venetia Maxon  . Macular degeneration     Retinal hemorrhage (09/2013)   Past Surgical History  Procedure Laterality Date  . Tonsillectomy    . Cesarean section      (and h/o 6 miscarriages)  . Breast lumpectomy  1983    LEFT, benign  . Patella fracture surgery  05/2006    LEFT surgically repaired with pins and wire  . Dexa  05/2010    T score -4.5 spine, -2.4 femur; does not want to repeat  . Cataract extraction  2012    RIGHT  . Carotid US  2011    mild plaque formation  . Colonoscopy  03/2010    int hemorrhoids, diverticulosis, no need to repeat (Dr. Sydell Axon)  . Esophagogastroduodenoscopy  10/2009    small HH, multiple antric ulcerations s/p biopsy, mild chronic gastritis/duodenitis  . Esophagogastroduodenoscopy  05/26/2012    RMR: small HH, o/w normal.   . Dilation and curettage of uterus      x2  . Laparoscopic cholecystectomy  02/2013    Dr. Geroge Baseman  . Cholecystectomy N/A 02/27/2013    Procedure: LAPAROSCOPIC CHOLECYSTECTOMY;  Surgeon: Donato Heinz, MD;  Location: [facility];  Service:  General;  Laterality: N/A;  . Dexa  04/2013    T score 3.9 [facility] spine, 2.6 hip   Family History  Problem Relation Age of Onset  . Coronary artery disease Mother 80    MI deceased  . Colon cancer Father 93  . Arrhythmia Sister   . Stroke Paternal Grandmother   . Diabetes Neg Hx    History  Substance Use Topics  . Smoking status: Former Smoker -- 1.00 packs/day for 20 years    Types: Cigarettes    Quit date: 10/13/1998  . Smokeless tobacco: Never Used  . Alcohol Use: No   OB History   Grav Para Term Preterm Abortions TAB SAB Ect Mult Living                 Review of Systems  Constitutional: Negative for fever and chills.  Gastrointestinal: Negative for vomiting and abdominal pain.  Musculoskeletal: Negative for back pain and neck pain.  Skin: Positive for wound.  Neurological: Negative  for dizziness and headaches.  All other systems reviewed and are negative.     Allergies  Propoxyphene n-acetaminophen; Codeine; Meperidine hcl; Morphine; and Shellfish allergy  Home Medications   Prior to Admission medications   Medication Sig Start Date End Date Taking? Authorizing Provider  acetaminophen (TYLENOL) 500 MG tablet Take 500 mg by mouth daily as needed. For pain    Historical Provider, MD  brimonidine (ALPHAGAN P) 0.1 % SOLN     Historical Provider, MD  Calcium-Magnesium-Vitamin D [phone] MG-MG-UNIT TABS Take 1 tablet by mouth 2 (two) times daily.    Historical Provider, MD  clonazePAM (KLONOPIN) 0.5 MG tablet TAKE 1 TABLET BY MOUTH AT BEDTIME AS NEEDED 09/13/13   Ria Bush, MD  docusate sodium (COLACE) 100 MG capsule Take 100 mg by mouth at bedtime as needed for constipation.     Historical Provider, MD  estradiol (ESTRACE) 0.1 MG/GM vaginal cream Place 2 g vaginally as directed.      Historical Provider, MD  felodipine (PLENDIL) 5 MG 24 hr tablet TAKE 1 TABLET DAILY    Lendon Colonel, NP  glucose blood test strip Use as instructed--FREESTYLE LITE 05/23/13   Ria Bush, MD  ibandronate (BONIVA) 3 MG/3ML SOLN injection Inject 3 mg into the vein every 3 (three) months.  04/05/12   Ria Bush, MD  latanoprost (XALATAN) 0.005 % ophthalmic solution Place 1 drop into both eyes at bedtime.     Historical Provider, MD  levothyroxine (SYNTHROID, LEVOTHROID) 50 MCG tablet TAKE 1 TABLET DAILY 05/04/13   Ria Bush, MD  Multiple Vitamin (MULTIVITAMIN) capsule Take 1 capsule by mouth every morning.     Historical Provider, MD  Omega-3 Fatty Acids (FISH OIL CONCENTRATE) 1000 MG CAPS Take 2 capsules by mouth 2 (two) times daily.      Historical Provider, MD  polyethylene glycol (MIRALAX / GLYCOLAX) packet Take 17 g by mouth 3 (three) times a week. As needed for constipation    Historical Provider, MD  sertraline (ZOLOFT) 25 MG tablet Take 1 tablet (25 mg  total) by mouth daily. 05/23/13   Ria Bush, MD  traMADol (ULTRAM) 50 MG tablet TAKE 1 TABLET BY MOUTH EVERY 6 HOURS AS NEEDED 09/13/13   Ria Bush, MD  warfarin (COUMADIN) 2.5 MG tablet Take 2 tablets daily except 1 tablet on Wednesdays and Saturdays 11/22/13   Satira Sark, MD   BP 137/90  Pulse 90  Temp(Src) 97.6 F (36.4 C) (Oral)  Resp 18  Ht 5\' 6"  (1.676 m)  Wt 115 lb (52.164 kg)  BMI 18.57 kg/m2  SpO2 95% Physical Exam CONSTITUTIONAL: frail, elderly but no distress noted HEAD: Normocephalic/atraumatic EYES: EOMI ENMT: Mucous membranes moist, No evidence of facial/nasal trauma NECK: supple no meningeal signs SPINE:entire spine nontender, No bruising/crepitance/stepoffs noted to spine CV: irregular, no tachycardia Chest: tenderness to left lateral ribs no crepitus or bruising.  LUNGS: Lungs are clear to auscultation bilaterally, no apparent distress ABDOMEN: soft, nontender, no rebound or guarding, no bruising to abdominal wall GU:no cva tenderness NEURO: Pt is awake/alert, moves all extremitiesx4. Pt is able to ambulate EXTREMITIES: pulses normal, full ROM. Abrasion to left elbow with mild tenderness to left elbow noted, all other extremities/joints ranged and nontender  SKIN: warm, color normal PSYCH: no abnormalities of mood noted  ED Course  Procedures  Medications  HYDROcodone-acetaminophen (NORCO/VICODIN) 5-325 MG per tablet 1 tablet (1 tablet Oral Given 12/01/13 0049)  Tdap (BOOSTRIX) injection 0.5 mL (0.5 mLs Intramuscular Given 12/01/13 0048)    DIAGNOSTIC STUDIES: Oxygen Saturation is 95% on RA, normal by my interpretation.    COORDINATION OF CARE: 12:18 AM- Discussed treatment plan with pt. Pt agrees to plan.   Imaging negative Pt has no other complaints I defer Ct head/spinal imaging as no head injury, no HA and no signs of trauma She is coumadin but I don't feel further testing warranted She was able to tolerate vicodin while in the ED  (she has multiple drug allergies) Stable for d/c home  Imaging Review Dg Ribs Unilateral W/chest Left  11/30/2013   CLINICAL DATA:  Patient fell from standing position, landing on the left side. Pain.  EXAM: LEFT RIBS AND CHEST - 3+ VIEW  COMPARISON:  CT CHEST W/CM dated 06/13/2010  FINDINGS: No fracture or other bone lesions are seen involving the ribs. There is no evidence of pneumothorax or pleural effusion. Both lungs are clear. Emphysematous changes are suggested. Heart size and mediastinal contours are within normal limits.  IMPRESSION: Emphysematous changes in the lungs. No pneumothorax. No displaced left rib fractures.   Electronically Signed   By: Lucienne Capers M.D.   On: 11/30/2013 23:55   Dg Elbow Complete Left  11/30/2013   CLINICAL DATA:  Patient fell from standing, landing on the left side. Pain. Bruising.  EXAM: LEFT ELBOW - COMPLETE 3+ VIEW  COMPARISON:  None.  FINDINGS: There is no evidence of fracture, dislocation, or joint effusion. There is no evidence of arthropathy or other focal bone abnormality. Soft tissues are unremarkable.  IMPRESSION: Negative.   Electronically Signed   By: Lucienne Capers M.D.   On: 11/30/2013 23:53      MDM   Final diagnoses:  Fall  Abrasion of left elbow  Chest wall pain    Nursing notes including past medical history and social history reviewed and considered in documentation xrays reviewed and considered   I personally performed the services described in this documentation, which was scribed in my presence. The recorded information has been reviewed and is accurate.      Sharyon Cable, MD 12/01/13 [id number]

## 2013-12-06 ENCOUNTER — Ambulatory Visit: Payer: Medicare Other | Admitting: Cardiology

## 2013-12-06 DIAGNOSIS — L570 Actinic keratosis: Secondary | ICD-10-CM | POA: Diagnosis not present

## 2013-12-06 DIAGNOSIS — Z85828 Personal history of other malignant neoplasm of skin: Secondary | ICD-10-CM | POA: Diagnosis not present

## 2013-12-07 ENCOUNTER — Ambulatory Visit (INDEPENDENT_AMBULATORY_CARE_PROVIDER_SITE_OTHER)
Admission: RE | Admit: 2013-12-07 | Discharge: 2013-12-07 | Disposition: A | Discharge status: Home / Self Care | Payer: Medicare Other | Source: Ambulatory Visit | Attending: Family Medicine | Admitting: Family Medicine

## 2013-12-07 ENCOUNTER — Encounter: Payer: Self-pay | Admitting: Family Medicine

## 2013-12-07 ENCOUNTER — Ambulatory Visit (INDEPENDENT_AMBULATORY_CARE_PROVIDER_SITE_OTHER): Payer: Medicare Other | Admitting: Family Medicine

## 2013-12-07 VITALS — BP 164/84 | HR 84 | Temp 97.5°F | Wt 115.5 lb

## 2013-12-07 DIAGNOSIS — R079 Chest pain, unspecified: Secondary | ICD-10-CM | POA: Diagnosis not present

## 2013-12-07 DIAGNOSIS — J449 Chronic obstructive pulmonary disease, unspecified: Secondary | ICD-10-CM | POA: Diagnosis not present

## 2013-12-07 DIAGNOSIS — R0781 Pleurodynia: Secondary | ICD-10-CM | POA: Insufficient documentation

## 2013-12-07 MED ORDER — TRAMADOL HCL 50 MG PO TABS: ORAL_TABLET | ORAL | Status: DC

## 2013-12-07 NOTE — Progress Notes (Signed)
Pre visit review using our clinic review tool, if applicable. No additional management support is needed unless otherwise documented below in the visit note. 

## 2013-12-07 NOTE — Addendum Note (Signed)
Addended by: Ria Bush on: 12/07/2013 06:10 PM   Modules accepted: Orders

## 2013-12-07 NOTE — Patient Instructions (Signed)
I remain suspicious for rib fractures - this can take 6-8 weeks to fully recover. Recheck xrays today. Continue tramadol 50mg  twice daily for pain. Use body pillow for coughing and continue deep breathing as much as able. Let us know if not improving each week.

## 2013-12-07 NOTE — Progress Notes (Signed)
BP 164/84  Pulse 84  Temp(Src) 97.5 F (36.4 C) (Oral)  Wt 115 lb 8 oz (52.39 kg)   CC: ER f/u  Subjective:    Patient ID: [name], female    DOB: [birth year], 80 y.o.   MRN: [id number]  HPI: [name] is a 80 y.o. female presenting on 12/07/2013 for Follow-up   Pt presents for ER f/u after suffered fall 11/30/2013.  Standing on chair trying to get a telephone from a top shelf, fell backwards off chair, L side of body hit wall.  Main pain was at left ribcage and L hip.  Pain slowly improving but present more time than prior. Especially noticeable with deep breaths. Hydrocodone too strong so she has been taking tramadol daily prn pain. No dizziness prior to fall.    Imaging Review  Dg Ribs Unilateral W/chest Left  11/30/2013 CLINICAL DATA: Patient fell from standing position, landing on the left side. Pain. EXAM: LEFT RIBS AND CHEST - 3+ VIEW COMPARISON: CT CHEST W/CM dated 06/13/2010 FINDINGS: No fracture or other bone lesions are seen involving the ribs. There is no evidence of pneumothorax or pleural effusion. Both lungs are clear. Emphysematous changes are suggested. Heart size and mediastinal contours are within normal limits. IMPRESSION: Emphysematous changes in the lungs. No pneumothorax. No displaced left rib fractures. Electronically Signed By: Lucienne Capers M.D. On: 11/30/2013 23:55  Dg Elbow Complete Left  11/30/2013 CLINICAL DATA: Patient fell from standing, landing on the left side. Pain. Bruising. EXAM: LEFT ELBOW - COMPLETE 3+ VIEW COMPARISON: None. FINDINGS: There is no evidence of fracture, dislocation, or joint effusion. There is no evidence of arthropathy or other focal bone abnormality. Soft tissues are unremarkable. IMPRESSION: Negative. Electronically Signed By: Lucienne Capers M.D. On: 11/30/2013 23:53    Relevant past medical, surgical, family and social history reviewed and updated as indicated.  Allergies and medications reviewed and  updated. Current Outpatient Prescriptions on File Prior to Visit  Medication Sig  . brimonidine (ALPHAGAN P) 0.1 % SOLN Place 1 drop into both eyes 2 (two) times daily.   . Calcium-Magnesium-Vitamin D [phone] MG-MG-UNIT TABS Take 1 tablet by mouth 2 (two) times daily.  . clonazePAM (KLONOPIN) 0.5 MG tablet TAKE 1 TABLET BY MOUTH AT BEDTIME AS NEEDED  . docusate sodium (COLACE) 100 MG capsule Take 100 mg by mouth at bedtime as needed for constipation.   Marland Kitchen estradiol (ESTRACE) 0.1 MG/GM vaginal cream Place 2 g vaginally as directed.    . felodipine (PLENDIL) 5 MG 24 hr tablet TAKE 1 TABLET DAILY  . glucose blood test strip Use as instructed--FREESTYLE LITE  . ibandronate (BONIVA) 3 MG/3ML SOLN injection Inject 3 mg into the vein every 3 (three) months.   . latanoprost (XALATAN) 0.005 % ophthalmic solution Place 1 drop into both eyes at bedtime.   Marland Kitchen levothyroxine (SYNTHROID, LEVOTHROID) 50 MCG tablet TAKE 1 TABLET DAILY  . Multiple Vitamin (MULTIVITAMIN) capsule Take 1 capsule by mouth every morning.   . Omega-3 Fatty Acids (FISH OIL CONCENTRATE) 1000 MG CAPS Take 2 capsules by mouth 2 (two) times daily.    . polyethylene glycol (MIRALAX / GLYCOLAX) packet Take 17 g by mouth 3 (three) times a week. As needed for constipation  . sertraline (ZOLOFT) 25 MG tablet Take 1 tablet (25 mg total) by mouth daily.  . traMADol (ULTRAM) 50 MG tablet TAKE 1 TABLET BY MOUTH EVERY 6 HOURS AS NEEDED  . warfarin (COUMADIN) 2.5 MG tablet Take 2 tablets daily  except 1 tablet on Wednesdays and Saturdays  . [DISCONTINUED] Calcium Carbonate-Vitamin D (CALCIUM PLUS VITAMIN D PO) Take 2 tablets by mouth daily.     No current facility-administered medications on file prior to visit.    Review of Systems Per HPI unless specifically indicated above    Objective:    BP 164/84  Pulse 84  Temp(Src) 97.5 F (36.4 C) (Oral)  Wt 115 lb 8 oz (52.39 kg)  Physical Exam  Nursing note and vitals  reviewed. Constitutional: She appears well-developed and well-nourished. No distress.  Cardiovascular: Normal rate, normal heart sounds and intact distal pulses.  An irregularly irregular rhythm present.  No murmur heard. Pulmonary/Chest: Effort normal. No respiratory distress. She has decreased breath sounds (LLL). She has no wheezes. She has no rhonchi. She has no rales. She exhibits tenderness and bony tenderness. She exhibits no laceration.    Coarse L>R Clear RLL Tender to palpation diffusely anterior lower ribcage - areas are point tender. No bruising  Abdominal: Soft. Bowel sounds are normal. She exhibits no distension and no mass. There is no tenderness. There is no rebound and no guarding.  No HSM, mild discomfort to palpation of spleen but not as marked as palpation of ribcage   Results for orders placed in visit on 11/15/13  POCT INR      Result Value Ref Range   INR 3.0        Assessment & Plan:   Problem List Items Addressed This Visit   Rib pain on left side - Primary     After fall at home. Persistent diffuse anterior L lower ribcage pain, although some improving. Will recheck xrays today to follow injury. Continue tramadol and discussed use of body pillow for further support. rec continued deep breathing exercises. Update if not improving each day. No focal spleen pain on exam, lungs clear.    Relevant Orders      DG Ribs Unilateral Left       Follow up plan: Return if symptoms worsen or fail to improve.

## 2013-12-07 NOTE — Assessment & Plan Note (Signed)
After fall at home. Persistent diffuse anterior L lower ribcage pain, although some improving. Will recheck xrays today to follow injury. Continue tramadol and discussed use of body pillow for further support. rec continued deep breathing exercises. Update if not improving each day. No focal spleen pain on exam, lungs clear.

## 2013-12-08 ENCOUNTER — Other Ambulatory Visit: Payer: Self-pay | Admitting: *Deleted

## 2013-12-08 MED ORDER — SERTRALINE HCL 25 MG PO TABS: 25.0000 mg | ORAL_TABLET | Freq: Every day | ORAL | Status: DC

## 2013-12-15 ENCOUNTER — Telehealth: Payer: Self-pay | Admitting: Family Medicine

## 2013-12-15 NOTE — Telephone Encounter (Signed)
Pt called to update you on how she is doing after her fall. Still feeling about the same. Active and walking around but no danger warning signs per pt. Still on pain meds but says when meds are wearing off her pain is 8 out of 10 with 10 being the worst. Pain is located in left chest area still. Comfortable most of the time.

## 2013-12-17 NOTE — Telephone Encounter (Signed)
Noted. Ensure each day improving some.  continue current meds including tramadol

## 2013-12-19 DIAGNOSIS — H35059 Retinal neovascularization, unspecified, unspecified eye: Secondary | ICD-10-CM | POA: Diagnosis not present

## 2013-12-19 DIAGNOSIS — H353290 Exudative age-related macular degeneration, unspecified eye, stage unspecified: Secondary | ICD-10-CM | POA: Diagnosis not present

## 2013-12-19 NOTE — Telephone Encounter (Signed)
Left vm for pt to return call  

## 2013-12-20 ENCOUNTER — Ambulatory Visit (INDEPENDENT_AMBULATORY_CARE_PROVIDER_SITE_OTHER): Payer: Medicare Other | Admitting: *Deleted

## 2013-12-20 DIAGNOSIS — Z7901 Long term (current) use of anticoagulants: Secondary | ICD-10-CM | POA: Diagnosis not present

## 2013-12-20 DIAGNOSIS — G459 Transient cerebral ischemic attack, unspecified: Secondary | ICD-10-CM | POA: Diagnosis not present

## 2013-12-20 DIAGNOSIS — I4891 Unspecified atrial fibrillation: Secondary | ICD-10-CM

## 2013-12-20 DIAGNOSIS — Z5181 Encounter for therapeutic drug level monitoring: Secondary | ICD-10-CM

## 2013-12-20 LAB — POCT INR: INR: 3.1

## 2013-12-20 NOTE — Telephone Encounter (Signed)
Pt says she has been feeling much better over the past 3 days and continues to take 1 Tramadol daily.  She will call us if she starts to feel worse.

## 2013-12-25 ENCOUNTER — Encounter (HOSPITAL_COMMUNITY): Payer: Self-pay | Admitting: Emergency Medicine

## 2013-12-25 ENCOUNTER — Emergency Department (HOSPITAL_COMMUNITY)
Admission: EM | Admit: 2013-12-25 | Discharge: 2013-12-26 | Disposition: A | Discharge status: Home / Self Care | Payer: Medicare Other | Attending: Emergency Medicine | Admitting: Emergency Medicine

## 2013-12-25 DIAGNOSIS — I129 Hypertensive chronic kidney disease with stage 1 through stage 4 chronic kidney disease, or unspecified chronic kidney disease: Secondary | ICD-10-CM | POA: Diagnosis not present

## 2013-12-25 DIAGNOSIS — J449 Chronic obstructive pulmonary disease, unspecified: Secondary | ICD-10-CM | POA: Insufficient documentation

## 2013-12-25 DIAGNOSIS — Z79899 Other long term (current) drug therapy: Secondary | ICD-10-CM | POA: Diagnosis not present

## 2013-12-25 DIAGNOSIS — Z8719 Personal history of other diseases of the digestive system: Secondary | ICD-10-CM | POA: Insufficient documentation

## 2013-12-25 DIAGNOSIS — Z8669 Personal history of other diseases of the nervous system and sense organs: Secondary | ICD-10-CM | POA: Insufficient documentation

## 2013-12-25 DIAGNOSIS — I10 Essential (primary) hypertension: Secondary | ICD-10-CM | POA: Diagnosis not present

## 2013-12-25 DIAGNOSIS — Z85828 Personal history of other malignant neoplasm of skin: Secondary | ICD-10-CM | POA: Insufficient documentation

## 2013-12-25 DIAGNOSIS — Z7901 Long term (current) use of anticoagulants: Secondary | ICD-10-CM | POA: Insufficient documentation

## 2013-12-25 DIAGNOSIS — Z8739 Personal history of other diseases of the musculoskeletal system and connective tissue: Secondary | ICD-10-CM | POA: Insufficient documentation

## 2013-12-25 DIAGNOSIS — J4489 Other specified chronic obstructive pulmonary disease: Secondary | ICD-10-CM | POA: Insufficient documentation

## 2013-12-25 DIAGNOSIS — Z8673 Personal history of transient ischemic attack (TIA), and cerebral infarction without residual deficits: Secondary | ICD-10-CM | POA: Insufficient documentation

## 2013-12-25 DIAGNOSIS — N182 Chronic kidney disease, stage 2 (mild): Secondary | ICD-10-CM | POA: Diagnosis not present

## 2013-12-25 DIAGNOSIS — Z87891 Personal history of nicotine dependence: Secondary | ICD-10-CM | POA: Diagnosis not present

## 2013-12-25 DIAGNOSIS — Z8781 Personal history of (healed) traumatic fracture: Secondary | ICD-10-CM | POA: Diagnosis not present

## 2013-12-25 DIAGNOSIS — Z862 Personal history of diseases of the blood and blood-forming organs and certain disorders involving the immune mechanism: Secondary | ICD-10-CM | POA: Diagnosis not present

## 2013-12-25 DIAGNOSIS — E119 Type 2 diabetes mellitus without complications: Secondary | ICD-10-CM | POA: Insufficient documentation

## 2013-12-25 DIAGNOSIS — N390 Urinary tract infection, site not specified: Secondary | ICD-10-CM | POA: Diagnosis not present

## 2013-12-25 DIAGNOSIS — F411 Generalized anxiety disorder: Secondary | ICD-10-CM | POA: Insufficient documentation

## 2013-12-25 DIAGNOSIS — Z8619 Personal history of other infectious and parasitic diseases: Secondary | ICD-10-CM | POA: Diagnosis not present

## 2013-12-25 DIAGNOSIS — E039 Hypothyroidism, unspecified: Secondary | ICD-10-CM | POA: Diagnosis not present

## 2013-12-25 DIAGNOSIS — I4891 Unspecified atrial fibrillation: Secondary | ICD-10-CM | POA: Insufficient documentation

## 2013-12-25 LAB — URINALYSIS, ROUTINE W REFLEX MICROSCOPIC
BILIRUBIN URINE: NEGATIVE
GLUCOSE, UA: NEGATIVE mg/dL
KETONES UR: NEGATIVE mg/dL
Nitrite: NEGATIVE
PH: 7.5 (ref 5.0–8.0)
PROTEIN: NEGATIVE mg/dL
Specific Gravity, Urine: 1.01 (ref 1.005–1.030)
Urobilinogen, UA: 0.2 mg/dL (ref 0.0–1.0)

## 2013-12-25 LAB — URINE MICROSCOPIC-ADD ON

## 2013-12-25 MED ORDER — DEXTROSE 5 % IV SOLN
1.0000 g | Freq: Once | INTRAVENOUS | Status: AC
  Administered 2013-12-26: 1 g via INTRAVENOUS
  Filled 2013-12-25: qty 10

## 2013-12-25 NOTE — ED Notes (Signed)
Fever, dysuria onset today

## 2013-12-25 NOTE — ED Provider Notes (Signed)
CSN: [id number]     Arrival date & time 12/25/13  2142 History   First MD Initiated Contact with Patient 12/25/13 2344   This chart was scribed for Delora Fuel, MD by Anastasia Pall, ED Scribe. This patient was seen in room [facility] and the patient's care was started at 11:50 PM.    Chief Complaint  Patient presents with  . Fever     (Consider location/radiation/quality/duration/timing/severity/associated sxs/prior Treatment) The history is provided by the patient. No language interpreter was used.   HPI Comments: [name] is a 80 y.o. female who presents to the Emergency Department complaining of a fever max temp 102 onset earlier today. Patient states she has had associated burning dysuria, urinary urgency, urinary frequency, decreased urine output, and diaphoresis. She thinks she may have a UTI. She states that her nurse at [facility] told her to take the 1 Extra Strength Tylenol she is allotted. She states that she went to an urgent care for a UTI recently where they put her on Abx, she denies knowing which one.  Patient states that she fell 2 weeks ago and had internal busing and pain that has been getting better. She denies pain other than soreness from her fall. She denies chest pain, abdominal pain, chills, nausea, vomiting and any other symptoms.   PCP - Ria Bush, MD   Past Medical History  Diagnosis Date  . History of TIA (transient ischemic attack)   . Hypothyroidism   . History of rheumatic fever   . Osteoporosis 04/2013    Thoracic compression fracture, on bisphosphonate and cal/vit D  . Atrial fibrillation     Paroxysmal on coumadin  . Mixed hyperlipidemia   . Essential hypertension, benign   . Diabetes type 2, controlled     Diet controlled  . Pulmonary nodules     Stable bilateral on CT 01/2010, rec rpt yearly for 2 yrs, pt decided to stop f/u  . Aortic stenosis   . Anemia   . Arthritis   . History of skin cancer   . Anxiety   . Diverticulosis    . GERD (gastroesophageal reflux disease)   . Seasonal allergies   . History of pelvic fracture April 2013  . COPD (chronic obstructive pulmonary disease)   . Chronic gastritis 10/2009    Duodenitis on EGD  . Hiatal hernia 2011    Small  . Collagen vascular disease   . Glaucoma     Dr. Venetia Maxon  . Macular degeneration     Retinal hemorrhage (09/2013)  . CKD (chronic kidney disease) stage 2, GFR 60-89 ml/min    Past Surgical History  Procedure Laterality Date  . Tonsillectomy    . Cesarean section      (and h/o 6 miscarriages)  . Breast lumpectomy  1983    LEFT, benign  . Patella fracture surgery  05/2006    LEFT surgically repaired with pins and wire  . Dexa  05/2010    T score -4.5 spine, -2.4 femur; does not want to repeat  . Cataract extraction  2012    RIGHT  . Carotid US  2011    mild plaque formation  . Colonoscopy  03/2010    int hemorrhoids, diverticulosis, no need to repeat (Dr. Sydell Axon)  . Esophagogastroduodenoscopy  10/2009    small HH, multiple antric ulcerations s/p biopsy, mild chronic gastritis/duodenitis  . Esophagogastroduodenoscopy  05/26/2012    RMR: small HH, o/w normal.   . Dilation and curettage of uterus  x2  . Laparoscopic cholecystectomy  02/2013    Dr. Geroge Baseman  . Cholecystectomy N/A 02/27/2013    Procedure: LAPAROSCOPIC CHOLECYSTECTOMY;  Surgeon: Donato Heinz, MD;  Location: [facility];  Service: General;  Laterality: N/A;  . Dexa  04/2013    T score 3.9 [facility] spine, 2.6 hip   Family History  Problem Relation Age of Onset  . Coronary artery disease Mother 64    MI deceased  . Colon cancer Father 33  . Arrhythmia Sister   . Stroke Paternal Grandmother   . Diabetes Neg Hx    History  Substance Use Topics  . Smoking status: Former Smoker -- 1.00 packs/day for 20 years    Types: Cigarettes    Quit date: 10/13/1998  . Smokeless tobacco: Never Used  . Alcohol Use: No   OB History   Grav Para Term Preterm Abortions TAB SAB Ect Mult Living                  Review of Systems  All other systems reviewed and are negative.     Allergies  Propoxyphene n-acetaminophen; Codeine; Meperidine hcl; Morphine; and Shellfish allergy  Home Medications   Prior to Admission medications   Medication Sig Start Date End Date Taking? Authorizing Provider  acetaminophen (TYLENOL) 500 MG tablet Take 500 mg by mouth daily as needed.    Historical Provider, MD  brimonidine (ALPHAGAN P) 0.1 % SOLN Place 1 drop into both eyes 2 (two) times daily.     Historical Provider, MD  Calcium-Magnesium-Vitamin D [phone] MG-MG-UNIT TABS Take 1 tablet by mouth 2 (two) times daily.    Historical Provider, MD  clonazePAM (KLONOPIN) 0.5 MG tablet TAKE 1 TABLET BY MOUTH AT BEDTIME AS NEEDED 09/13/13   Ria Bush, MD  docusate sodium (COLACE) 100 MG capsule Take 100 mg by mouth at bedtime as needed for constipation.     Historical Provider, MD  estradiol (ESTRACE) 0.1 MG/GM vaginal cream Place 2 g vaginally as directed.      Historical Provider, MD  felodipine (PLENDIL) 5 MG 24 hr tablet TAKE 1 TABLET DAILY    Lendon Colonel, NP  glucose blood test strip Use as instructed--FREESTYLE LITE 05/23/13   Ria Bush, MD  ibandronate (BONIVA) 3 MG/3ML SOLN injection Inject 3 mg into the vein every 3 (three) months.  04/05/12   Ria Bush, MD  latanoprost (XALATAN) 0.005 % ophthalmic solution Place 1 drop into both eyes at bedtime.     Historical Provider, MD  levothyroxine (SYNTHROID, LEVOTHROID) 50 MCG tablet TAKE 1 TABLET DAILY 05/04/13   Ria Bush, MD  Multiple Vitamin (MULTIVITAMIN) capsule Take 1 capsule by mouth every morning.     Historical Provider, MD  Omega-3 Fatty Acids (FISH OIL CONCENTRATE) 1000 MG CAPS Take 2 capsules by mouth 2 (two) times daily.      Historical Provider, MD  polyethylene glycol (MIRALAX / GLYCOLAX) packet Take 17 g by mouth 3 (three) times a week. As needed for constipation    Historical Provider, MD  sertraline  (ZOLOFT) 25 MG tablet Take 1 tablet (25 mg total) by mouth daily. 12/08/13   Ria Bush, MD  traMADol (ULTRAM) 50 MG tablet TAKE 1 TABLET BY MOUTH EVERY 6 HOURS AS NEEDED 12/07/13   Ria Bush, MD  warfarin (COUMADIN) 2.5 MG tablet Take 2 tablets daily except 1 tablet on Wednesdays and Saturdays 11/22/13   Satira Sark, MD   BP 129/84  Pulse 97  Temp(Src) 99.3 F (  37.4 C) (Oral)  Resp 16  Ht 5\' 6"  (1.676 m)  Wt 112 lb (50.803 kg)  BMI 18.09 kg/m2  SpO2 92% Physical Exam  Nursing note and vitals reviewed. Constitutional: She is oriented to person, place, and time. She appears well-developed and well-nourished. No distress.  HENT:  Head: Normocephalic and atraumatic.  Eyes: Conjunctivae and EOM are normal.  Artificial lens right eye.  Neck: Normal range of motion. No JVD present. No tracheal deviation present.  Cardiovascular: Normal rate, regular rhythm and normal heart sounds.   No murmur heard. Pulmonary/Chest: Effort normal and breath sounds normal. No respiratory distress. She has no wheezes. She has no rales.  Abdominal: Soft. Bowel sounds are normal. She exhibits no mass. There is no tenderness.  Musculoskeletal: Normal range of motion. She exhibits no edema.  Lymphadenopathy:    She has no cervical adenopathy.  Neurological: She is alert and oriented to person, place, and time. She has normal reflexes. No cranial nerve deficit. Coordination normal.  Skin: Skin is warm and dry. No rash noted.  Psychiatric: She has a normal mood and affect. Her behavior is normal. Thought content normal.    ED Course  Procedures (including critical care time)  DIAGNOSTIC STUDIES: Oxygen Saturation is 92% on room air, adequate by my interpretation.    COORDINATION OF CARE: 12:04 AM-Discussed treatment plan which includes an IV, a blood check, and an antibiotic if the UA is positive for UTI with pt at bedside and pt agreed to plan. Will send the urine for culture and determine  if the proper antibiotic was prescribed tonight.   Medications  cefTRIAXone (ROCEPHIN) 1 g in dextrose 5 % 50 mL IVPB (1 g Intravenous New Bag/Given 12/26/13 0039)     Results for orders placed during the hospital encounter of 12/25/13  URINALYSIS, ROUTINE W REFLEX MICROSCOPIC      Result Value Ref Range   Color, Urine YELLOW  YELLOW   APPearance CLEAR  CLEAR   Specific Gravity, Urine 1.010  1.005 - 1.030   pH 7.5  5.0 - 8.0   Glucose, UA NEGATIVE  NEGATIVE mg/dL   Hgb urine dipstick TRACE (*) NEGATIVE   Bilirubin Urine NEGATIVE  NEGATIVE   Ketones, ur NEGATIVE  NEGATIVE mg/dL   Protein, ur NEGATIVE  NEGATIVE mg/dL   Urobilinogen, UA 0.2  0.0 - 1.0 mg/dL   Nitrite NEGATIVE  NEGATIVE   Leukocytes, UA LARGE (*) NEGATIVE  URINE MICROSCOPIC-ADD ON      Result Value Ref Range   Squamous Epithelial / LPF RARE  RARE   WBC, UA 21-50  <3 WBC/hpf   RBC / HPF 0-2  <3 RBC/hpf   Bacteria, UA MANY (*) RARE  CBC WITH DIFFERENTIAL      Result Value Ref Range   WBC 16.0 (*) 4.0 - 10.5 K/uL   RBC 3.87  3.87 - 5.11 MIL/uL   Hemoglobin 12.9  12.0 - 15.0 g/dL   HCT 37.7  36.0 - 46.0 %   MCV 97.4  78.0 - 100.0 fL   MCH 33.3  26.0 - 34.0 pg   MCHC 34.2  30.0 - 36.0 g/dL   RDW 14.3  11.5 - 15.5 %   Platelets 347  150 - 400 K/uL   Neutrophils Relative % 87 (*) 43 - 77 %   Neutro Abs 13.9 (*) 1.7 - 7.7 K/uL   Lymphocytes Relative 6 (*) 12 - 46 %   Lymphs Abs 0.9  0.7 - 4.0 K/uL  Monocytes Relative 7  3 - 12 %   Monocytes Absolute 1.2 (*) 0.1 - 1.0 K/uL   Eosinophils Relative 0  0 - 5 %   Eosinophils Absolute 0.0  0.0 - 0.7 K/uL   Basophils Relative 0  0 - 1 %   Basophils Absolute 0.0  0.0 - 0.1 K/uL  BASIC METABOLIC PANEL      Result Value Ref Range   Sodium 135 (*) 137 - 147 mEq/L   Potassium 3.7  3.7 - 5.3 mEq/L   Chloride 95 (*) 96 - 112 mEq/L   CO2 26  19 - 32 mEq/L   Glucose, Bld 150 (*) 70 - 99 mg/dL   BUN 14  6 - 23 mg/dL   Creatinine, Ser 0.72  0.50 - 1.10 mg/dL   Calcium 9.0   8.4 - 10.5 mg/dL   GFR calc non Af Amer 76 (*) >90 mL/min   GFR calc Af Amer 88 (*) >90 mL/min   MDM   Final diagnoses:  Urinary tract infection    Urinary tract infection. Although WBC is elevated with left shift, patient does not appear toxic in any way. She's given a dose of ceftriaxone in the ED and I feel she is safe to be treated as an outpatient. She's given a prescription for cephalexin but is advised to return should symptoms worsen.  I personally performed the services described in this documentation, which was scribed in my presence. The recorded information has been reviewed and is accurate.     Delora Fuel, MD 17/40/81 4481

## 2013-12-26 LAB — CBC WITH DIFFERENTIAL/PLATELET
Basophils Absolute: 0 10*3/uL (ref 0.0–0.1)
Basophils Relative: 0 % (ref 0–1)
Eosinophils Absolute: 0 10*3/uL (ref 0.0–0.7)
Eosinophils Relative: 0 % (ref 0–5)
HCT: 37.7 % (ref 36.0–46.0)
Hemoglobin: 12.9 g/dL (ref 12.0–15.0)
Lymphocytes Relative: 6 % — ABNORMAL LOW (ref 12–46)
Lymphs Abs: 0.9 10*3/uL (ref 0.7–4.0)
MCH: 33.3 pg (ref 26.0–34.0)
MCHC: 34.2 g/dL (ref 30.0–36.0)
MCV: 97.4 fL (ref 78.0–100.0)
Monocytes Absolute: 1.2 10*3/uL — ABNORMAL HIGH (ref 0.1–1.0)
Monocytes Relative: 7 % (ref 3–12)
Neutro Abs: 13.9 10*3/uL — ABNORMAL HIGH (ref 1.7–7.7)
Neutrophils Relative %: 87 % — ABNORMAL HIGH (ref 43–77)
Platelets: 347 10*3/uL (ref 150–400)
RBC: 3.87 MIL/uL (ref 3.87–5.11)
RDW: 14.3 % (ref 11.5–15.5)
WBC: 16 10*3/uL — ABNORMAL HIGH (ref 4.0–10.5)

## 2013-12-26 LAB — BASIC METABOLIC PANEL
BUN: 14 mg/dL (ref 6–23)
CO2: 26 mEq/L (ref 19–32)
Calcium: 9 mg/dL (ref 8.4–10.5)
Chloride: 95 mEq/L — ABNORMAL LOW (ref 96–112)
Creatinine, Ser: 0.72 mg/dL (ref 0.50–1.10)
GFR calc non Af Amer: 76 mL/min — ABNORMAL LOW (ref >90)
GFR, EST AFRICAN AMERICAN: 88 mL/min — AB (ref >90)
Glucose, Bld: 150 mg/dL — ABNORMAL HIGH (ref 70–99)
POTASSIUM: 3.7 meq/L (ref 3.7–5.3)
Sodium: 135 mEq/L — ABNORMAL LOW (ref 137–147)

## 2013-12-26 MED ORDER — CEPHALEXIN 500 MG PO CAPS: 500.0000 mg | ORAL_CAPSULE | Freq: Four times a day (QID) | ORAL | Status: DC

## 2013-12-26 NOTE — Discharge Instructions (Signed)
Urinary Tract Infection Urinary tract infections (UTIs) can develop anywhere along your urinary tract. Your urinary tract is your body's drainage system for removing wastes and extra water. Your urinary tract includes two kidneys, two ureters, a bladder, and a urethra. Your kidneys are a pair of bean-shaped organs. Each kidney is about the size of your fist. They are located below your ribs, one on each side of your spine. CAUSES Infections are caused by microbes, which are microscopic organisms, including fungi, viruses, and bacteria. These organisms are so small that they can only be seen through a microscope. Bacteria are the microbes that most commonly cause UTIs. SYMPTOMS  Symptoms of UTIs may vary by age and gender of the patient and by the location of the infection. Symptoms in young women typically include a frequent and intense urge to urinate and a painful, burning feeling in the bladder or urethra during urination. Older women and men are more likely to be tired, shaky, and weak and have muscle aches and abdominal pain. A fever may mean the infection is in your kidneys. Other symptoms of a kidney infection include pain in your back or sides below the ribs, nausea, and vomiting. DIAGNOSIS To diagnose a UTI, your caregiver will ask you about your symptoms. Your caregiver also will ask to provide a urine sample. The urine sample will be tested for bacteria and white blood cells. White blood cells are made by your body to help fight infection. TREATMENT  Typically, UTIs can be treated with medication. Because most UTIs are caused by a bacterial infection, they usually can be treated with the use of antibiotics. The choice of antibiotic and length of treatment depend on your symptoms and the type of bacteria causing your infection. HOME CARE INSTRUCTIONS  If you were prescribed antibiotics, take them exactly as your caregiver instructs you. Finish the medication even if you feel better after you  have only taken some of the medication.  Drink enough water and fluids to keep your urine clear or pale yellow.  Avoid caffeine, tea, and carbonated beverages. They tend to irritate your bladder.  Empty your bladder often. Avoid holding urine for long periods of time.  Empty your bladder before and after sexual intercourse.  After a bowel movement, women should cleanse from front to back. Use each tissue only once. SEEK MEDICAL CARE IF:   You have back pain.  You develop a fever.  Your symptoms do not begin to resolve within 3 days. SEEK IMMEDIATE MEDICAL CARE IF:   You have severe back pain or lower abdominal pain.  You develop chills.  You have nausea or vomiting.  You have continued burning or discomfort with urination. MAKE SURE YOU:   Understand these instructions.  Will watch your condition.  Will get help right away if you are not doing well or get worse. Document Released: 04/15/2005 Document Revised: 01/05/2012 Document Reviewed: 08/14/2011 [facility] Patient Information 2014 [place].  Cephalexin tablets or capsules What is this medicine? CEPHALEXIN (sef a LEX in) is a cephalosporin antibiotic. It is used to treat certain kinds of bacterial infections It will not work for colds, flu, or other viral infections. This medicine may be used for other purposes; ask your health care provider or pharmacist if you have questions. COMMON BRAND NAME(S): Biocef, Keflex, Keftab What should I tell my health care provider before I take this medicine? They need to know if you have any of these conditions: -kidney disease -stomach or intestine problems, especially colitis -  an unusual or allergic reaction to cephalexin, other cephalosporins, penicillins, other antibiotics, medicines, foods, dyes or preservatives -pregnant or trying to get pregnant -breast-feeding How should I use this medicine? Take this medicine by mouth with a full glass of water. Follow the  directions on the prescription label. This medicine can be taken with or without food. Take your medicine at regular intervals. Do not take your medicine more often than directed. Take all of your medicine as directed even if you think you are better. Do not skip doses or stop your medicine early. Talk to your pediatrician regarding the use of this medicine in children. While this drug may be prescribed for selected conditions, precautions do apply. Overdosage: If you think you have taken too much of this medicine contact a poison control center or emergency room at once. NOTE: This medicine is only for you. Do not share this medicine with others. What if I miss a dose? If you miss a dose, take it as soon as you can. If it is almost time for your next dose, take only that dose. Do not take double or extra doses. There should be at least 4 to 6 hours between doses. What may interact with this medicine? -probenecid -some other antibiotics This list may not describe all possible interactions. Give your health care provider a list of all the medicines, herbs, non-prescription drugs, or dietary supplements you use. Also tell them if you smoke, drink alcohol, or use illegal drugs. Some items may interact with your medicine. What should I watch for while using this medicine? Tell your doctor or health care professional if your symptoms do not begin to improve in a few days. Do not treat diarrhea with over the counter products. Contact your doctor if you have diarrhea that lasts more than 2 days or if it is severe and watery. If you have diabetes, you may get a false-positive result for sugar in your urine. Check with your doctor or health care professional. What side effects may I notice from receiving this medicine? Side effects that you should report to your doctor or health care professional as soon as possible: -allergic reactions like skin rash, itching or hives, swelling of the face, lips, or  tongue -breathing problems -pain or trouble passing urine -redness, blistering, peeling or loosening of the skin, including inside the mouth -severe or watery diarrhea -unusually weak or tired -yellowing of the eyes, skin Side effects that usually do not require medical attention (report to your doctor or health care professional if they continue or are bothersome): -gas or heartburn -genital or anal irritation -headache -joint or muscle pain -nausea, vomiting This list may not describe all possible side effects. Call your doctor for medical advice about side effects. You may report side effects to FDA at 1-800-FDA-1088. Where should I keep my medicine? Keep out of the reach of children. Store at room temperature between 59 and 86 degrees F (15 and 30 degrees C). Throw away any unused medicine after the expiration date. NOTE: This sheet is a summary. It may not cover all possible information. If you have questions about this medicine, talk to your doctor, pharmacist, or health care provider.  2014, [employer] Standard. (2007-10-10 17:09:13)

## 2013-12-28 ENCOUNTER — Telehealth: Payer: Self-pay

## 2013-12-28 LAB — URINE CULTURE

## 2013-12-28 NOTE — Telephone Encounter (Signed)
plz notify - urine returned sensitive to abx chosen. I don't think she needs such high dose of keflex - would suggest decrease to BID for rest of 10 day course.

## 2013-12-28 NOTE — Telephone Encounter (Signed)
Patient notified and will decrease abx to 1 BID.

## 2013-12-28 NOTE — Telephone Encounter (Signed)
Pt left v/m pt was seen at ED for UTI on 12/25/13; pt has been taking Cephalexin 500 mg one cap four times a day for 10 days and pt was to call for urine C/S report to see if antibiotic was going to take care of infection. Pt said she is much better, no fever and no burning or urgency of urine.[facility]. Pt request cb.

## 2013-12-31 ENCOUNTER — Telehealth (HOSPITAL_BASED_OUTPATIENT_CLINIC_OR_DEPARTMENT_OTHER): Payer: Self-pay | Admitting: Emergency Medicine

## 2013-12-31 NOTE — Telephone Encounter (Signed)
Post ED Visit - Positive Culture Follow-up  Culture report reviewed by antimicrobial stewardship pharmacist: []  Wes Leoti, Pharm.D., BCPS [x]  Heide Guile, Pharm.D., BCPS []  Alycia Rossetti, Pharm.D., BCPS []  Campbell, Pharm.D., BCPS, AAHIVP []  Legrand Como, Pharm.D., BCPS, AAHIVP []  Juliene Pina, Pharm.D.  Positive urine culture Treated with Keflex, organism sensitive to the same and no further patient follow-up is required at this time.  Matthew Saras, Rex Kras 12/31/2013, 10:25 AM

## 2014-01-15 ENCOUNTER — Telehealth: Payer: Self-pay | Admitting: Cardiology

## 2014-01-15 NOTE — Telephone Encounter (Signed)
New rx for warfarin.

## 2014-01-15 NOTE — Telephone Encounter (Signed)
See refill bin / tgs

## 2014-01-16 MED ORDER — WARFARIN SODIUM 2.5 MG PO TABS: ORAL_TABLET | ORAL | Status: DC

## 2014-01-16 NOTE — Telephone Encounter (Signed)
Rx sent to [facility]

## 2014-01-17 ENCOUNTER — Ambulatory Visit (INDEPENDENT_AMBULATORY_CARE_PROVIDER_SITE_OTHER): Payer: Medicare Other | Admitting: *Deleted

## 2014-01-17 DIAGNOSIS — Z5181 Encounter for therapeutic drug level monitoring: Secondary | ICD-10-CM

## 2014-01-17 DIAGNOSIS — Z7901 Long term (current) use of anticoagulants: Secondary | ICD-10-CM | POA: Diagnosis not present

## 2014-01-17 DIAGNOSIS — G459 Transient cerebral ischemic attack, unspecified: Secondary | ICD-10-CM | POA: Diagnosis not present

## 2014-01-17 DIAGNOSIS — I4891 Unspecified atrial fibrillation: Secondary | ICD-10-CM

## 2014-01-17 LAB — POCT INR: INR: 2.7

## 2014-01-23 DIAGNOSIS — H353290 Exudative age-related macular degeneration, unspecified eye, stage unspecified: Secondary | ICD-10-CM | POA: Diagnosis not present

## 2014-01-23 DIAGNOSIS — H35059 Retinal neovascularization, unspecified, unspecified eye: Secondary | ICD-10-CM | POA: Diagnosis not present

## 2014-02-14 ENCOUNTER — Ambulatory Visit (INDEPENDENT_AMBULATORY_CARE_PROVIDER_SITE_OTHER): Payer: Medicare Other | Admitting: *Deleted

## 2014-02-14 DIAGNOSIS — Z7901 Long term (current) use of anticoagulants: Secondary | ICD-10-CM | POA: Diagnosis not present

## 2014-02-14 DIAGNOSIS — I4891 Unspecified atrial fibrillation: Secondary | ICD-10-CM

## 2014-02-14 DIAGNOSIS — Z5181 Encounter for therapeutic drug level monitoring: Secondary | ICD-10-CM | POA: Diagnosis not present

## 2014-02-14 DIAGNOSIS — G459 Transient cerebral ischemic attack, unspecified: Secondary | ICD-10-CM

## 2014-02-14 LAB — POCT INR: INR: 2.9

## 2014-02-22 ENCOUNTER — Other Ambulatory Visit: Payer: Self-pay | Admitting: Family Medicine

## 2014-02-22 NOTE — Telephone Encounter (Signed)
Ok to refill in Dr. [name] absence? Last filled 12/07/13 #90 0RF

## 2014-02-23 NOTE — Telephone Encounter (Signed)
Please call in.  Thanks.   

## 2014-02-23 NOTE — Telephone Encounter (Signed)
Rx called in as directed.   

## 2014-02-26 ENCOUNTER — Telehealth: Payer: Self-pay | Admitting: *Deleted

## 2014-02-26 NOTE — Telephone Encounter (Signed)
Patient due for Boniva. Order in your IN box for completion.

## 2014-02-27 NOTE — Telephone Encounter (Signed)
Order faxed to [facility] and patient advised to call to schedule an appointment.

## 2014-02-27 NOTE — Telephone Encounter (Signed)
Signed and placed in Kim's box. 

## 2014-03-06 DIAGNOSIS — H353290 Exudative age-related macular degeneration, unspecified eye, stage unspecified: Secondary | ICD-10-CM | POA: Diagnosis not present

## 2014-03-06 DIAGNOSIS — H35059 Retinal neovascularization, unspecified, unspecified eye: Secondary | ICD-10-CM | POA: Diagnosis not present

## 2014-03-07 ENCOUNTER — Other Ambulatory Visit (HOSPITAL_COMMUNITY): Payer: Self-pay | Admitting: *Deleted

## 2014-03-07 DIAGNOSIS — Z85828 Personal history of other malignant neoplasm of skin: Secondary | ICD-10-CM | POA: Diagnosis not present

## 2014-03-08 ENCOUNTER — Encounter (HOSPITAL_COMMUNITY)
Admission: RE | Admit: 2014-03-08 | Discharge: 2014-03-08 | Disposition: A | Discharge status: Home / Self Care | Payer: Medicare Other | Source: Ambulatory Visit | Attending: Family Medicine | Admitting: Family Medicine

## 2014-03-08 DIAGNOSIS — M810 Age-related osteoporosis without current pathological fracture: Secondary | ICD-10-CM | POA: Insufficient documentation

## 2014-03-08 MED ORDER — IBANDRONATE SODIUM 3 MG/3ML IV SOLN: 3.0000 mg | Freq: Once | INTRAVENOUS | Status: DC

## 2014-03-08 MED ORDER — IBANDRONATE SODIUM 3 MG/3ML IV SOLN
INTRAVENOUS | Status: AC
  Administered 2014-03-08: 3 mg
  Filled 2014-03-08: qty 3

## 2014-03-12 ENCOUNTER — Other Ambulatory Visit: Payer: Self-pay | Admitting: Family Medicine

## 2014-03-12 NOTE — Telephone Encounter (Signed)
Ok to refill 

## 2014-03-13 NOTE — Telephone Encounter (Signed)
plz phone in. 

## 2014-03-13 NOTE — Telephone Encounter (Signed)
Rx called in as directed.   

## 2014-03-14 ENCOUNTER — Ambulatory Visit (INDEPENDENT_AMBULATORY_CARE_PROVIDER_SITE_OTHER): Payer: Medicare Other | Admitting: *Deleted

## 2014-03-14 DIAGNOSIS — G458 Other transient cerebral ischemic attacks and related syndromes: Secondary | ICD-10-CM

## 2014-03-14 DIAGNOSIS — G459 Transient cerebral ischemic attack, unspecified: Secondary | ICD-10-CM | POA: Diagnosis not present

## 2014-03-14 DIAGNOSIS — Z5181 Encounter for therapeutic drug level monitoring: Secondary | ICD-10-CM | POA: Diagnosis not present

## 2014-03-14 DIAGNOSIS — I4891 Unspecified atrial fibrillation: Secondary | ICD-10-CM

## 2014-03-14 DIAGNOSIS — Z7901 Long term (current) use of anticoagulants: Secondary | ICD-10-CM | POA: Diagnosis not present

## 2014-03-14 LAB — POCT INR: INR: 2.4

## 2014-04-11 ENCOUNTER — Ambulatory Visit (INDEPENDENT_AMBULATORY_CARE_PROVIDER_SITE_OTHER): Payer: Medicare Other | Admitting: *Deleted

## 2014-04-11 ENCOUNTER — Telehealth: Payer: Self-pay | Admitting: *Deleted

## 2014-04-11 DIAGNOSIS — D485 Neoplasm of uncertain behavior of skin: Secondary | ICD-10-CM | POA: Diagnosis not present

## 2014-04-11 DIAGNOSIS — L570 Actinic keratosis: Secondary | ICD-10-CM | POA: Diagnosis not present

## 2014-04-11 DIAGNOSIS — I4891 Unspecified atrial fibrillation: Secondary | ICD-10-CM | POA: Diagnosis not present

## 2014-04-11 DIAGNOSIS — G459 Transient cerebral ischemic attack, unspecified: Secondary | ICD-10-CM

## 2014-04-11 DIAGNOSIS — Z5181 Encounter for therapeutic drug level monitoring: Secondary | ICD-10-CM | POA: Diagnosis not present

## 2014-04-11 DIAGNOSIS — Z85828 Personal history of other malignant neoplasm of skin: Secondary | ICD-10-CM | POA: Diagnosis not present

## 2014-04-11 DIAGNOSIS — Z7901 Long term (current) use of anticoagulants: Secondary | ICD-10-CM

## 2014-04-11 DIAGNOSIS — D235 Other benign neoplasm of skin of trunk: Secondary | ICD-10-CM | POA: Diagnosis not present

## 2014-04-11 LAB — POCT INR: INR: 3.2

## 2014-04-11 NOTE — Telephone Encounter (Signed)
Pt needs to change appointment to 10/26 at 9:00, but the expiration date will not let me change it.

## 2014-04-11 NOTE — Telephone Encounter (Signed)
Done/lmr

## 2014-04-17 DIAGNOSIS — H1045 Other chronic allergic conjunctivitis: Secondary | ICD-10-CM | POA: Diagnosis not present

## 2014-04-19 DIAGNOSIS — H4011X2 Primary open-angle glaucoma, moderate stage: Secondary | ICD-10-CM | POA: Diagnosis not present

## 2014-04-19 DIAGNOSIS — H3531 Nonexudative age-related macular degeneration: Secondary | ICD-10-CM | POA: Diagnosis not present

## 2014-04-24 ENCOUNTER — Other Ambulatory Visit: Payer: Self-pay | Admitting: Family Medicine

## 2014-04-24 DIAGNOSIS — H3532 Exudative age-related macular degeneration: Secondary | ICD-10-CM | POA: Diagnosis not present

## 2014-04-24 DIAGNOSIS — H3531 Nonexudative age-related macular degeneration: Secondary | ICD-10-CM | POA: Diagnosis not present

## 2014-04-24 DIAGNOSIS — Z1231 Encounter for screening mammogram for malignant neoplasm of breast: Secondary | ICD-10-CM

## 2014-04-26 ENCOUNTER — Ambulatory Visit (INDEPENDENT_AMBULATORY_CARE_PROVIDER_SITE_OTHER): Payer: Medicare Other | Admitting: Family Medicine

## 2014-04-26 ENCOUNTER — Encounter: Payer: Self-pay | Admitting: Family Medicine

## 2014-04-26 VITALS — BP 130/68 | HR 64 | Temp 97.8°F | Ht 64.75 in | Wt 110.2 lb

## 2014-04-26 DIAGNOSIS — I10 Essential (primary) hypertension: Secondary | ICD-10-CM

## 2014-04-26 DIAGNOSIS — E538 Deficiency of other specified B group vitamins: Secondary | ICD-10-CM | POA: Diagnosis not present

## 2014-04-26 DIAGNOSIS — Z23 Encounter for immunization: Secondary | ICD-10-CM

## 2014-04-26 DIAGNOSIS — I4819 Other persistent atrial fibrillation: Secondary | ICD-10-CM

## 2014-04-26 DIAGNOSIS — F410 Panic disorder [episodic paroxysmal anxiety] without agoraphobia: Secondary | ICD-10-CM

## 2014-04-26 DIAGNOSIS — K5900 Constipation, unspecified: Secondary | ICD-10-CM

## 2014-04-26 DIAGNOSIS — M810 Age-related osteoporosis without current pathological fracture: Secondary | ICD-10-CM | POA: Diagnosis not present

## 2014-04-26 DIAGNOSIS — I481 Persistent atrial fibrillation: Secondary | ICD-10-CM

## 2014-04-26 DIAGNOSIS — F418 Other specified anxiety disorders: Secondary | ICD-10-CM

## 2014-04-26 DIAGNOSIS — E785 Hyperlipidemia, unspecified: Secondary | ICD-10-CM | POA: Diagnosis not present

## 2014-04-26 DIAGNOSIS — Z0000 Encounter for general adult medical examination without abnormal findings: Secondary | ICD-10-CM | POA: Diagnosis not present

## 2014-04-26 DIAGNOSIS — R5382 Chronic fatigue, unspecified: Secondary | ICD-10-CM | POA: Diagnosis not present

## 2014-04-26 DIAGNOSIS — Z7189 Other specified counseling: Secondary | ICD-10-CM

## 2014-04-26 DIAGNOSIS — E039 Hypothyroidism, unspecified: Secondary | ICD-10-CM | POA: Diagnosis not present

## 2014-04-26 DIAGNOSIS — E119 Type 2 diabetes mellitus without complications: Secondary | ICD-10-CM | POA: Diagnosis not present

## 2014-04-26 LAB — CBC WITH DIFFERENTIAL/PLATELET
BASOS PCT: 0.5 % (ref 0.0–3.0)
Basophils Absolute: 0 10*3/uL (ref 0.0–0.1)
EOS PCT: 1.5 % (ref 0.0–5.0)
Eosinophils Absolute: 0.1 10*3/uL (ref 0.0–0.7)
HEMATOCRIT: 44.8 % (ref 36.0–46.0)
Hemoglobin: 14.6 g/dL (ref 12.0–15.0)
Lymphocytes Relative: 24.3 % (ref 12.0–46.0)
Lymphs Abs: 2.2 10*3/uL (ref 0.7–4.0)
MCHC: 32.5 g/dL (ref 30.0–36.0)
MCV: 100.6 fl — ABNORMAL HIGH (ref 78.0–100.0)
MONO ABS: 0.6 10*3/uL (ref 0.1–1.0)
MONOS PCT: 6.4 % (ref 3.0–12.0)
Neutro Abs: 6.1 10*3/uL (ref 1.4–7.7)
Neutrophils Relative %: 67.3 % (ref 43.0–77.0)
Platelets: 336 10*3/uL (ref 150.0–400.0)
RBC: 4.45 Mil/uL (ref 3.87–5.11)
RDW: 14.4 % (ref 11.5–15.5)
WBC: 9 10*3/uL (ref 4.0–10.5)

## 2014-04-26 LAB — VITAMIN B12: Vitamin B-12: 450 pg/mL (ref 211–911)

## 2014-04-26 LAB — COMPREHENSIVE METABOLIC PANEL
ALK PHOS: 62 U/L (ref 39–117)
ALT: 23 U/L (ref 0–35)
AST: 30 U/L (ref 0–37)
Albumin: 3.4 g/dL — ABNORMAL LOW (ref 3.5–5.2)
BILIRUBIN TOTAL: 0.8 mg/dL (ref 0.2–1.2)
BUN: 11 mg/dL (ref 6–23)
CO2: 33 mEq/L — ABNORMAL HIGH (ref 19–32)
CREATININE: 0.7 mg/dL (ref 0.4–1.2)
Calcium: 9.4 mg/dL (ref 8.4–10.5)
Chloride: 102 mEq/L (ref 96–112)
GFR: 84.2 mL/min (ref >60.00)
Glucose, Bld: 100 mg/dL — ABNORMAL HIGH (ref 70–99)
Potassium: 4.9 mEq/L (ref 3.5–5.1)
Sodium: 141 mEq/L (ref 135–145)
Total Protein: 7.4 g/dL (ref 6.0–8.3)

## 2014-04-26 LAB — LIPID PANEL
Cholesterol: 242 mg/dL — ABNORMAL HIGH (ref 0–200)
HDL: 50.2 mg/dL (ref >39.00)
LDL CALC: 165 mg/dL — AB (ref 0–99)
NONHDL: 191.8
Total CHOL/HDL Ratio: 5
Triglycerides: 132 mg/dL (ref 0.0–149.0)
VLDL: 26.4 mg/dL (ref 0.0–40.0)

## 2014-04-26 LAB — HEMOGLOBIN A1C: HEMOGLOBIN A1C: 6.9 % — AB (ref 4.6–6.5)

## 2014-04-26 LAB — TSH: TSH: 1.17 u[IU]/mL (ref 0.35–4.50)

## 2014-04-26 MED ORDER — SERTRALINE HCL 50 MG PO TABS: 50.0000 mg | ORAL_TABLET | Freq: Every day | ORAL | Status: DC

## 2014-04-26 MED ORDER — TRAMADOL HCL 50 MG PO TABS: ORAL_TABLET | ORAL | Status: DC

## 2014-04-26 NOTE — Assessment & Plan Note (Signed)
rec start miralax daily.

## 2014-04-26 NOTE — Progress Notes (Signed)
BP 130/68  Pulse 64  Temp(Src) 97.8 F (36.6 C) (Oral)  Ht 5' 4.75" (1.645 m)  Wt 110 lb 4 oz (50.009 kg)  BMI 18.48 kg/m2   CC: medicare wellness visit  Subjective:    Patient ID: [name], female    DOB: [birth year], 80 y.o.   MRN: [id number]  HPI: [name] is a 80 y.o. female presenting on 04/26/2014 for Annual Exam   Planning on return to [place] in January for 1 month this year. Osteoporosis on boniva.  Feeling persistent imbalance and weakness, persistent tremors - intention and postural, not resting. Persistent insomnia. Self stopped klonopin 2 wks ago. Anxiety attacks have worsened. No dizziness.   Hearing screen normal. Vision screen stable - sees eye doctor regularly 1 fall this year - fell down while standing on chair. Screens positive for depression - PHQH9 = 8 --> "mood doing bad".  Preventative: Last colonoscopy was 3 yrs ago (2011), rec no need to repeat. Father did die of colon cancer. GI - in [place].  Mammogram last 05/2013 WNL, schedules herself.  Well woman with Dr. Glo Herring in [place] - planning on returning in 3 years DEXA Date: 04/2013 T score 3.9 AP spine, 2.6 hip. On boniva since 2013 Tdap 11/2013 Pneumovax - had one after age 44yo. prevnar today. Flu shot - today  Shingles shot - around 2011 Advanced directive on file (04/2013): Desires no prolonged life sustaining measures if terminally ill. Husband is HCPOA.  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  Allergies and medications reviewed and updated. Current Outpatient Prescriptions on File Prior to Visit  Medication Sig  . acetaminophen (TYLENOL) 500 MG tablet Take 500 mg by mouth daily as needed.  .  brimonidine (ALPHAGAN P) 0.1 % SOLN Place 1 drop into both eyes 2 (two) times daily.   . Calcium-Magnesium-Vitamin D 132-440-102 MG-MG-UNIT TABS Take 1 tablet by mouth 2 (two) times daily.  . clonazePAM (KLONOPIN) 0.5 MG tablet TAKE 1 TABLET BY MOUTH AT BEDTIME AS NEEDED  . estradiol (ESTRACE) 0.1 MG/GM vaginal cream Place 2 g vaginally as directed.    . felodipine (PLENDIL) 5 MG 24 hr tablet TAKE 1 TABLET DAILY  . glucose blood test strip Use as instructed--FREESTYLE LITE  . ibandronate (BONIVA) 3 MG/3ML SOLN injection Inject 3 mg into the vein every 3 (three) months.   . latanoprost (XALATAN) 0.005 % ophthalmic solution Place 1 drop into both eyes at bedtime.   Marland Kitchen levothyroxine (SYNTHROID, LEVOTHROID) 50 MCG tablet TAKE 1 TABLET DAILY  . Multiple Vitamin (MULTIVITAMIN) capsule Take 1 capsule by mouth every morning.   . Omega-3 Fatty Acids (FISH OIL CONCENTRATE) 1000 MG CAPS Take 2 capsules by mouth 2 (two) times daily.    . polyethylene glycol (MIRALAX / GLYCOLAX) packet Take 17 g by mouth 3 (three) times a week. As needed for constipation  . warfarin (COUMADIN) 2.5 MG tablet Take 2 tablets daily except 1 tablet on Wednesdays and Saturdays  . [DISCONTINUED] Calcium Carbonate-Vitamin D (CALCIUM PLUS VITAMIN D PO) Take 2 tablets by mouth daily.     No current facility-administered medications on file prior to visit.    Review of Systems Per HPI unless specifically indicated above    Objective:    BP 130/68  Pulse  64  Temp(Src) 97.8 F (36.6 C) (Oral)  Ht 5' 4.75" (1.645 m)  Wt 110 lb 4 oz (50.009 kg)  BMI 18.48 kg/m2  Physical Exam  Nursing note and vitals reviewed. Constitutional: She is oriented to person, place, and time. She appears well-developed and well-nourished. No distress.  HENT:  Head: Normocephalic and atraumatic.  Right Ear: Hearing, tympanic membrane, external ear and ear canal normal.  Left Ear: Hearing, tympanic membrane, external ear and ear canal normal.  Nose:  Nose normal.  Mouth/Throat: Uvula is midline, oropharynx is clear and moist and mucous membranes are normal. No oropharyngeal exudate, posterior oropharyngeal edema or posterior oropharyngeal erythema.  Eyes: Conjunctivae and EOM are normal. Pupils are equal, round, and reactive to light. No scleral icterus.  Neck: Normal range of motion. Neck supple. Carotid bruit is not present. No thyromegaly present.  Cardiovascular: Normal rate, normal heart sounds and intact distal pulses.  An irregularly irregular rhythm present.  No murmur heard. Pulses:      Radial pulses are 2+ on the right side, and 2+ on the left side.  Pulmonary/Chest: Effort normal and breath sounds normal. No respiratory distress. She has no wheezes. She has no rales.  Abdominal: Soft. Bowel sounds are normal. She exhibits no distension and no mass. There is no tenderness. There is no rebound and no guarding.  Musculoskeletal: Normal range of motion. She exhibits no edema.  Lymphadenopathy:    She has no cervical adenopathy.  Neurological: She is alert and oriented to person, place, and time. She has normal strength. No sensory deficit. She displays a negative Romberg sign. Coordination and gait normal.  CN grossly intact, station and gait intact Recall 3/3  Calculation 5/5 serial 3s 5/5 strength BUE, no pronator drift  Skin: Skin is warm and dry. No rash noted.  Psychiatric: She has a normal mood and affect. Her behavior is normal. Judgment and thought content normal.   Results for orders placed in visit on 04/11/14  POCT INR      Result Value Ref Range   INR 3.2        Assessment & Plan:   Problem List Items Addressed This Visit   Persistent atrial fibrillation     Chronic, stable.    Osteoporosis     Continue bisphosphonate, calcium/vit D.    Medicare annual wellness visit, subsequent     I have personally reviewed the Medicare Annual Wellness questionnaire and have noted 1. The patient's medical and social  history 2. Their use of alcohol, tobacco or illicit drugs 3. Their current medications and supplements 4. The patient's functional ability including ADL's, fall risks, home safety risks and hearing or visual impairment. 5. Diet and physical activity 6. Evidence for depression or mood disorders The patients weight, height, BMI have been recorded in the chart.  Hearing and vision has been addressed. I have made referrals, counseling and provided education to the patient based review of the above and I have provided the pt with a written personalized care plan for preventive services. Provider list updated - see scanned questionairre.  Reviewed preventative protocols and updated unless pt declined.    Hypothyroidism     Check today.    Hyperlipidemia     Check FLP today. Only on fish oil 2gm bid.    Fatigue     Persistent trouble - check labs today for reversible causes.    Essential hypertension, benign     Chronic, stable. Continue plendil 5mg  daily.  Diabetes type 2, controlled     Check A1c today.    Depression with anxiety     Increase sertraline to 50mg  daily given endorsed depressed mood.    Constipation     rec start miralax daily.    Anxiety attack     Worse since she self tapered off klonopin. rec restart this and will increase sertraline to 50mg  daily.    Relevant Medications      sertraline (ZOLOFT) tablet   Advanced care planning/counseling discussion - Primary     Advanced directive on file (04/2013): Desires no prolonged life sustaining measures if terminally ill. Husband is HCPOA.        Follow up plan: Return in about 6 months (around 10/26/2014), or as needed, for follow up visit.

## 2014-04-26 NOTE — Assessment & Plan Note (Signed)

## 2014-04-26 NOTE — Assessment & Plan Note (Signed)
Advanced directive on file (04/2013): Desires no prolonged life sustaining measures if terminally ill. Husband is HCPOA.

## 2014-04-26 NOTE — Assessment & Plan Note (Signed)
Check A1c today.

## 2014-04-26 NOTE — Assessment & Plan Note (Signed)
Chronic, stable. Continue plendil 5mg  daily.

## 2014-04-26 NOTE — Assessment & Plan Note (Signed)
Persistent trouble - check labs today for reversible causes.

## 2014-04-26 NOTE — Patient Instructions (Addendum)
Flu and prevnar today. Increase sertraline to 50mg  daily, restart klonopin nightly. Try miralax on a daily basis, with lots of water. I've refilled tramadol. Blood work today. Return as needed or in 6 months for follow up visit.

## 2014-04-26 NOTE — Assessment & Plan Note (Signed)
Continue bisphosphonate, calcium/vit D.

## 2014-04-26 NOTE — Progress Notes (Signed)
Pre visit review using our clinic review tool, if applicable. No additional management support is needed unless otherwise documented below in the visit note. 

## 2014-04-26 NOTE — Assessment & Plan Note (Signed)
Worse since she self tapered off klonopin. rec restart this and will increase sertraline to 50mg  daily.

## 2014-04-26 NOTE — Assessment & Plan Note (Signed)
Chronic, stable 

## 2014-04-26 NOTE — Assessment & Plan Note (Signed)
Check FLP today. Only on fish oil 2gm bid.

## 2014-04-26 NOTE — Addendum Note (Signed)
Addended by: Ria Bush on: 04/26/2014 01:04 PM   Modules accepted: Orders

## 2014-04-26 NOTE — Assessment & Plan Note (Signed)
Check today 

## 2014-04-26 NOTE — Addendum Note (Signed)
Addended by: Royann Shivers A on: 04/26/2014 12:41 PM   Modules accepted: Orders

## 2014-04-26 NOTE — Assessment & Plan Note (Signed)
Increase sertraline to 50mg  daily given endorsed depressed mood.

## 2014-04-27 ENCOUNTER — Telehealth: Payer: Self-pay | Admitting: Family Medicine

## 2014-04-27 NOTE — Telephone Encounter (Signed)
emmi mailed  °

## 2014-04-30 ENCOUNTER — Encounter: Payer: Self-pay | Admitting: *Deleted

## 2014-05-01 ENCOUNTER — Other Ambulatory Visit: Payer: Self-pay | Admitting: Family Medicine

## 2014-05-14 ENCOUNTER — Ambulatory Visit (INDEPENDENT_AMBULATORY_CARE_PROVIDER_SITE_OTHER): Payer: Medicare Other | Admitting: *Deleted

## 2014-05-14 DIAGNOSIS — G459 Transient cerebral ischemic attack, unspecified: Secondary | ICD-10-CM | POA: Diagnosis not present

## 2014-05-14 DIAGNOSIS — Z5181 Encounter for therapeutic drug level monitoring: Secondary | ICD-10-CM

## 2014-05-14 DIAGNOSIS — I4891 Unspecified atrial fibrillation: Secondary | ICD-10-CM

## 2014-05-14 DIAGNOSIS — Z7901 Long term (current) use of anticoagulants: Secondary | ICD-10-CM

## 2014-05-14 DIAGNOSIS — I4819 Other persistent atrial fibrillation: Secondary | ICD-10-CM

## 2014-05-14 DIAGNOSIS — I481 Persistent atrial fibrillation: Secondary | ICD-10-CM

## 2014-05-14 LAB — POCT INR: INR: 2.4

## 2014-05-30 ENCOUNTER — Ambulatory Visit (HOSPITAL_COMMUNITY)
Admission: RE | Admit: 2014-05-30 | Discharge: 2014-05-30 | Disposition: A | Discharge status: Home / Self Care | Payer: Medicare Other | Source: Ambulatory Visit | Attending: Family Medicine | Admitting: Family Medicine

## 2014-05-30 DIAGNOSIS — Z1231 Encounter for screening mammogram for malignant neoplasm of breast: Secondary | ICD-10-CM | POA: Insufficient documentation

## 2014-05-30 LAB — HM MAMMOGRAPHY: HM Mammogram: NORMAL

## 2014-05-31 ENCOUNTER — Encounter: Payer: Self-pay | Admitting: *Deleted

## 2014-06-07 ENCOUNTER — Encounter (HOSPITAL_COMMUNITY)
Admission: RE | Admit: 2014-06-07 | Discharge: 2014-06-07 | Disposition: A | Discharge status: Home / Self Care | Payer: Medicare Other | Source: Ambulatory Visit | Attending: Family Medicine | Admitting: Family Medicine

## 2014-06-07 DIAGNOSIS — M810 Age-related osteoporosis without current pathological fracture: Secondary | ICD-10-CM | POA: Diagnosis not present

## 2014-06-07 MED ORDER — IBANDRONATE SODIUM 3 MG/3ML IV SOLN
INTRAVENOUS | Status: AC
  Administered 2014-06-07: 3 mg via INTRAVENOUS
  Filled 2014-06-07: qty 3

## 2014-06-11 ENCOUNTER — Ambulatory Visit (INDEPENDENT_AMBULATORY_CARE_PROVIDER_SITE_OTHER): Payer: Medicare Other | Admitting: *Deleted

## 2014-06-11 DIAGNOSIS — I481 Persistent atrial fibrillation: Secondary | ICD-10-CM

## 2014-06-11 DIAGNOSIS — I4891 Unspecified atrial fibrillation: Secondary | ICD-10-CM

## 2014-06-11 DIAGNOSIS — I4819 Other persistent atrial fibrillation: Secondary | ICD-10-CM

## 2014-06-11 DIAGNOSIS — G459 Transient cerebral ischemic attack, unspecified: Secondary | ICD-10-CM | POA: Diagnosis not present

## 2014-06-11 DIAGNOSIS — Z7901 Long term (current) use of anticoagulants: Secondary | ICD-10-CM

## 2014-06-11 DIAGNOSIS — Z5181 Encounter for therapeutic drug level monitoring: Secondary | ICD-10-CM

## 2014-06-11 LAB — POCT INR: INR: 3

## 2014-06-19 DIAGNOSIS — H3532 Exudative age-related macular degeneration: Secondary | ICD-10-CM | POA: Diagnosis not present

## 2014-07-04 DIAGNOSIS — L570 Actinic keratosis: Secondary | ICD-10-CM | POA: Diagnosis not present

## 2014-07-04 DIAGNOSIS — Z85828 Personal history of other malignant neoplasm of skin: Secondary | ICD-10-CM | POA: Diagnosis not present

## 2014-07-04 DIAGNOSIS — Z08 Encounter for follow-up examination after completed treatment for malignant neoplasm: Secondary | ICD-10-CM | POA: Diagnosis not present

## 2014-07-11 ENCOUNTER — Ambulatory Visit (INDEPENDENT_AMBULATORY_CARE_PROVIDER_SITE_OTHER): Payer: Medicare Other | Admitting: *Deleted

## 2014-07-11 ENCOUNTER — Other Ambulatory Visit: Payer: Self-pay | Admitting: Adult Health

## 2014-07-11 DIAGNOSIS — G459 Transient cerebral ischemic attack, unspecified: Secondary | ICD-10-CM

## 2014-07-11 DIAGNOSIS — I4891 Unspecified atrial fibrillation: Secondary | ICD-10-CM

## 2014-07-11 DIAGNOSIS — I481 Persistent atrial fibrillation: Secondary | ICD-10-CM

## 2014-07-11 DIAGNOSIS — I4819 Other persistent atrial fibrillation: Secondary | ICD-10-CM

## 2014-07-11 DIAGNOSIS — Z7901 Long term (current) use of anticoagulants: Secondary | ICD-10-CM | POA: Diagnosis not present

## 2014-07-11 DIAGNOSIS — Z5181 Encounter for therapeutic drug level monitoring: Secondary | ICD-10-CM | POA: Diagnosis not present

## 2014-07-11 LAB — POCT INR: INR: 2.9

## 2014-07-16 ENCOUNTER — Other Ambulatory Visit: Payer: Self-pay | Admitting: Family Medicine

## 2014-07-16 NOTE — Telephone Encounter (Signed)
Ok to refill in Dr. [name] absence? Tramadol last filled 04/26/14 #90 0RF; Clonazepam 03/13/14 #03/13/14 #90 0RF. Please send back to me.

## 2014-07-17 NOTE — Telephone Encounter (Signed)
Phoned in to pharmacy. 

## 2014-07-17 NOTE — Telephone Encounter (Signed)
Please call in.  Thanks.   

## 2014-07-25 DIAGNOSIS — H401432 Capsular glaucoma with pseudoexfoliation of lens, bilateral, moderate stage: Secondary | ICD-10-CM | POA: Diagnosis not present

## 2014-07-25 DIAGNOSIS — H2512 Age-related nuclear cataract, left eye: Secondary | ICD-10-CM | POA: Diagnosis not present

## 2014-07-27 DIAGNOSIS — H401432 Capsular glaucoma with pseudoexfoliation of lens, bilateral, moderate stage: Secondary | ICD-10-CM | POA: Diagnosis not present

## 2014-08-08 ENCOUNTER — Ambulatory Visit (INDEPENDENT_AMBULATORY_CARE_PROVIDER_SITE_OTHER): Payer: Medicare Other | Admitting: *Deleted

## 2014-08-08 DIAGNOSIS — G459 Transient cerebral ischemic attack, unspecified: Secondary | ICD-10-CM

## 2014-08-08 DIAGNOSIS — I4891 Unspecified atrial fibrillation: Secondary | ICD-10-CM | POA: Diagnosis not present

## 2014-08-08 DIAGNOSIS — I4819 Other persistent atrial fibrillation: Secondary | ICD-10-CM

## 2014-08-08 DIAGNOSIS — Z5181 Encounter for therapeutic drug level monitoring: Secondary | ICD-10-CM | POA: Diagnosis not present

## 2014-08-08 DIAGNOSIS — I481 Persistent atrial fibrillation: Secondary | ICD-10-CM

## 2014-08-08 DIAGNOSIS — Z7901 Long term (current) use of anticoagulants: Secondary | ICD-10-CM | POA: Diagnosis not present

## 2014-08-08 LAB — POCT INR: INR: 3.6

## 2014-08-22 DIAGNOSIS — H401432 Capsular glaucoma with pseudoexfoliation of lens, bilateral, moderate stage: Secondary | ICD-10-CM | POA: Diagnosis not present

## 2014-09-05 ENCOUNTER — Ambulatory Visit (INDEPENDENT_AMBULATORY_CARE_PROVIDER_SITE_OTHER): Payer: Medicare Other | Admitting: *Deleted

## 2014-09-05 DIAGNOSIS — Z5181 Encounter for therapeutic drug level monitoring: Secondary | ICD-10-CM | POA: Diagnosis not present

## 2014-09-05 DIAGNOSIS — I4891 Unspecified atrial fibrillation: Secondary | ICD-10-CM | POA: Diagnosis not present

## 2014-09-05 DIAGNOSIS — I4819 Other persistent atrial fibrillation: Secondary | ICD-10-CM

## 2014-09-05 DIAGNOSIS — G459 Transient cerebral ischemic attack, unspecified: Secondary | ICD-10-CM

## 2014-09-05 DIAGNOSIS — I481 Persistent atrial fibrillation: Secondary | ICD-10-CM

## 2014-09-05 DIAGNOSIS — Z7901 Long term (current) use of anticoagulants: Secondary | ICD-10-CM | POA: Diagnosis not present

## 2014-09-05 LAB — POCT INR: INR: 2.3

## 2014-09-05 MED ORDER — WARFARIN SODIUM 2.5 MG PO TABS: ORAL_TABLET | ORAL | Status: DC

## 2014-09-11 DIAGNOSIS — H3532 Exudative age-related macular degeneration: Secondary | ICD-10-CM | POA: Diagnosis not present

## 2014-09-11 DIAGNOSIS — H35052 Retinal neovascularization, unspecified, left eye: Secondary | ICD-10-CM | POA: Diagnosis not present

## 2014-09-17 ENCOUNTER — Telehealth: Payer: Self-pay | Admitting: *Deleted

## 2014-09-17 NOTE — Telephone Encounter (Signed)
LMOM that I would like to check her INR before she goes out of the country.  She is to call back and reschedule that appt.

## 2014-09-17 NOTE — Telephone Encounter (Signed)
Pt is leaving out of town on 09/21/14, does she need seen before hand?

## 2014-09-19 ENCOUNTER — Ambulatory Visit (INDEPENDENT_AMBULATORY_CARE_PROVIDER_SITE_OTHER): Payer: Medicare Other | Admitting: *Deleted

## 2014-09-19 DIAGNOSIS — G459 Transient cerebral ischemic attack, unspecified: Secondary | ICD-10-CM

## 2014-09-19 DIAGNOSIS — I4891 Unspecified atrial fibrillation: Secondary | ICD-10-CM

## 2014-09-19 DIAGNOSIS — I481 Persistent atrial fibrillation: Secondary | ICD-10-CM | POA: Diagnosis not present

## 2014-09-19 DIAGNOSIS — Z5181 Encounter for therapeutic drug level monitoring: Secondary | ICD-10-CM | POA: Diagnosis not present

## 2014-09-19 DIAGNOSIS — I4819 Other persistent atrial fibrillation: Secondary | ICD-10-CM

## 2014-09-19 DIAGNOSIS — Z7901 Long term (current) use of anticoagulants: Secondary | ICD-10-CM

## 2014-09-19 LAB — POCT INR: INR: 2.8

## 2014-10-20 ENCOUNTER — Other Ambulatory Visit: Payer: Self-pay | Admitting: Family Medicine

## 2014-10-23 NOTE — Telephone Encounter (Signed)
Rx called in as directed.   

## 2014-10-23 NOTE — Telephone Encounter (Signed)
plz phone in. 

## 2014-10-23 NOTE — Telephone Encounter (Signed)
Ok to refill 

## 2014-10-24 ENCOUNTER — Ambulatory Visit (INDEPENDENT_AMBULATORY_CARE_PROVIDER_SITE_OTHER): Payer: Medicare Other | Admitting: *Deleted

## 2014-10-24 DIAGNOSIS — I4819 Other persistent atrial fibrillation: Secondary | ICD-10-CM

## 2014-10-24 DIAGNOSIS — Z5181 Encounter for therapeutic drug level monitoring: Secondary | ICD-10-CM | POA: Diagnosis not present

## 2014-10-24 DIAGNOSIS — I481 Persistent atrial fibrillation: Secondary | ICD-10-CM

## 2014-10-24 DIAGNOSIS — I4891 Unspecified atrial fibrillation: Secondary | ICD-10-CM

## 2014-10-24 DIAGNOSIS — G459 Transient cerebral ischemic attack, unspecified: Secondary | ICD-10-CM | POA: Diagnosis not present

## 2014-10-24 DIAGNOSIS — Z7901 Long term (current) use of anticoagulants: Secondary | ICD-10-CM

## 2014-10-24 LAB — POCT INR: INR: 3.7

## 2014-10-29 ENCOUNTER — Telehealth: Payer: Self-pay | Admitting: Family Medicine

## 2014-10-29 DIAGNOSIS — H3530 Unspecified macular degeneration: Secondary | ICD-10-CM | POA: Insufficient documentation

## 2014-10-29 DIAGNOSIS — H409 Unspecified glaucoma: Secondary | ICD-10-CM | POA: Insufficient documentation

## 2014-10-29 NOTE — Telephone Encounter (Signed)
Referral placed.

## 2014-10-29 NOTE — Telephone Encounter (Signed)
Pt would like a referral for an eye doctor, he doesn't want to see Dr Venetia Maxon.

## 2014-11-01 DIAGNOSIS — L570 Actinic keratosis: Secondary | ICD-10-CM | POA: Diagnosis not present

## 2014-11-01 DIAGNOSIS — L820 Inflamed seborrheic keratosis: Secondary | ICD-10-CM | POA: Diagnosis not present

## 2014-11-13 DIAGNOSIS — L249 Irritant contact dermatitis, unspecified cause: Secondary | ICD-10-CM | POA: Diagnosis not present

## 2014-11-13 DIAGNOSIS — T498X5A Adverse effect of other topical agents, initial encounter: Secondary | ICD-10-CM | POA: Diagnosis not present

## 2014-11-14 ENCOUNTER — Ambulatory Visit (INDEPENDENT_AMBULATORY_CARE_PROVIDER_SITE_OTHER): Payer: Medicare Other | Admitting: *Deleted

## 2014-11-14 DIAGNOSIS — G459 Transient cerebral ischemic attack, unspecified: Secondary | ICD-10-CM

## 2014-11-14 DIAGNOSIS — Z5181 Encounter for therapeutic drug level monitoring: Secondary | ICD-10-CM | POA: Diagnosis not present

## 2014-11-14 DIAGNOSIS — I4819 Other persistent atrial fibrillation: Secondary | ICD-10-CM

## 2014-11-14 DIAGNOSIS — I481 Persistent atrial fibrillation: Secondary | ICD-10-CM

## 2014-11-14 DIAGNOSIS — I4891 Unspecified atrial fibrillation: Secondary | ICD-10-CM | POA: Diagnosis not present

## 2014-11-14 DIAGNOSIS — Z7901 Long term (current) use of anticoagulants: Secondary | ICD-10-CM

## 2014-11-14 LAB — POCT INR: INR: 3

## 2014-11-24 ENCOUNTER — Other Ambulatory Visit: Payer: Self-pay | Admitting: Cardiology

## 2014-12-10 ENCOUNTER — Ambulatory Visit (INDEPENDENT_AMBULATORY_CARE_PROVIDER_SITE_OTHER): Payer: Medicare Other | Admitting: Adult Health

## 2014-12-10 ENCOUNTER — Encounter: Payer: Self-pay | Admitting: Adult Health

## 2014-12-10 VITALS — BP 158/80 | HR 72 | Ht 66.0 in | Wt 113.0 lb

## 2014-12-10 DIAGNOSIS — I482 Chronic atrial fibrillation, unspecified: Secondary | ICD-10-CM

## 2014-12-10 DIAGNOSIS — I10 Essential (primary) hypertension: Secondary | ICD-10-CM

## 2014-12-10 MED ORDER — FELODIPINE ER 5 MG PO TB24: 5.0000 mg | ORAL_TABLET | Freq: Every day | ORAL | Status: DC
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
## 2014-12-10 NOTE — Progress Notes (Signed)
Cardiology Office Note   Date:  12/10/2014   ID:  MARETTA [name], DOB 1925-10-14, MRN [id number]  PCP:  Ria Bush, MD  Cardiologist:  [place]/ Jory Sims, NP   Chief Complaint  Patient presents with  . Atrial Fibrillation  . Hypertension      History of Present Illness: [name] is a 81 y.o. female who presents for ongoing assessment and management of atrial fibrillation, CHADS VASC Score of 4 on coumadin therapy, hypertension. She was last seen by Dr. Domenic Polite on 11/27/2013 with good control of HR and BP. No changes were made to her medical regimen.  She comes today without cardiac complaints. Continues to struggle with anxiety and depression which she talks about openly. She and her PCP are working together on this. She denies racing HR, palpitations, or  Dyspnea. She has not taken her antihypertensive today yet.        Past Medical History  Diagnosis Date  . History of TIA (transient ischemic attack)   . Hypothyroidism   . History of rheumatic fever   . Osteoporosis 04/2013    Thoracic compression fracture, on bisphosphonate and cal/vit D  . Atrial fibrillation     Paroxysmal on coumadin  . Mixed hyperlipidemia   . Essential hypertension, benign   . Diabetes type 2, controlled     Diet controlled  . Pulmonary nodules     Stable bilateral on CT 01/2010, rec rpt yearly for 2 yrs, pt decided to stop f/u  . Aortic stenosis   . Anemia   . Arthritis   . History of skin cancer   . Anxiety   . Diverticulosis   . GERD (gastroesophageal reflux disease)   . Seasonal allergies   . History of pelvic fracture April 2013  . COPD (chronic obstructive pulmonary disease)   . Chronic gastritis 10/2009    Duodenitis on EGD  . Hiatal hernia 2011    Small  . Collagen vascular disease   . Glaucoma     Dr. Venetia Maxon  . Macular degeneration     Retinal hemorrhage (09/2013)  . CKD (chronic kidney disease) stage 2, GFR 60-89 ml/min     Past Surgical History   Procedure Laterality Date  . Tonsillectomy    . Cesarean section      (and h/o 6 miscarriages)  . Breast lumpectomy  1983    LEFT, benign  . Patella fracture surgery  05/2006    LEFT surgically repaired with pins and wire  . Dexa  05/2010    T score -4.5 spine, -2.4 femur; does not want to repeat  . Cataract extraction  2012    RIGHT  . Carotid US  2011    mild plaque formation  . Colonoscopy  03/2010    int hemorrhoids, diverticulosis, no need to repeat (Dr. Sydell Axon)  . Esophagogastroduodenoscopy  10/2009    small HH, multiple antric ulcerations s/p biopsy, mild chronic gastritis/duodenitis  . Esophagogastroduodenoscopy  05/26/2012    RMR: small HH, o/w normal.   . Dilation and curettage of uterus      x2  . Laparoscopic cholecystectomy  02/2013    Dr. Geroge Baseman  . Cholecystectomy N/A 02/27/2013    Procedure: LAPAROSCOPIC CHOLECYSTECTOMY;  Surgeon: Donato Heinz, MD;  Location: [facility];  Service: General;  Laterality: N/A;  . Dexa  04/2013    T score 3.9 [facility] spine, 2.6 hip     Current Outpatient Prescriptions  Medication Sig Dispense Refill  . acetaminophen (TYLENOL)  500 MG tablet Take 500 mg by mouth daily as needed.    . brimonidine (ALPHAGAN P) 0.1 % SOLN Place 1 drop into both eyes 2 (two) times daily.     . Calcium-Magnesium-Vitamin D [id number] MG-MG-UNIT TABS Take 1 tablet by mouth 2 (two) times daily.    . clonazePAM (KLONOPIN) 0.5 MG tablet TAKE 1 TABLET BY MOUTH AT BEDTIME AS NEEDED 90 tablet 0  . estradiol (ESTRACE) 0.1 MG/GM vaginal cream Place 2 g vaginally as directed.      . felodipine (PLENDIL) 5 MG 24 hr tablet TAKE 1 TABLET DAILY 30 tablet 0  . glucose blood test strip Use as instructed--FREESTYLE LITE 100 each 3  . ibandronate (BONIVA) 3 MG/3ML SOLN injection Inject 3 mg into the vein every 3 (three) months.  3.6 mL   . latanoprost (XALATAN) 0.005 % ophthalmic solution Place 1 drop into both eyes at bedtime.     Marland Kitchen levothyroxine (SYNTHROID, LEVOTHROID) 50 MCG  tablet TAKE 1 TABLET DAILY 90 tablet 2  . Magnesium Oxide 500 MG (LAX) TABS Take 1 tablet by mouth 3 (three) times a week.    . Multiple Vitamin (MULTIVITAMIN) capsule Take 1 capsule by mouth every morning.     . Omega-3 Fatty Acids (FISH OIL CONCENTRATE) 1000 MG CAPS Take 2 capsules by mouth 2 (two) times daily.      . polyethylene glycol (MIRALAX / GLYCOLAX) packet Take 17 g by mouth daily. As needed for constipation    . sertraline (ZOLOFT) 50 MG tablet Take 1 tablet (50 mg total) by mouth daily. 90 tablet 3  . warfarin (COUMADIN) 2.5 MG tablet Take 2 tablets daily except 1 tablet on Tuesdays, Thursdays and Saturdays 180 tablet 3  . [DISCONTINUED] Calcium Carbonate-Vitamin D (CALCIUM PLUS VITAMIN D PO) Take 2 tablets by mouth daily.       No current facility-administered medications for this visit.    Allergies:   Propoxyphene n-acetaminophen; Codeine; Meperidine hcl; Morphine; and Shellfish allergy    Social History:  The patient  reports that she quit smoking about 16 years ago. Her smoking use included Cigarettes. She has a 20 pack-year smoking history. She has never used smokeless tobacco. She reports that she does not drink alcohol or use illicit drugs.   Family History:  The patient's family history includes Arrhythmia in her sister; Colon cancer (age of onset: 37) in her father; Coronary artery disease (age of onset: 74) in her mother; Stroke in her paternal grandmother. There is no history of Diabetes.    ROS: .   All other systems are reviewed and negative.Unless otherwise mentioned in H&P above.   PHYSICAL EXAM: VS:  BP 158/80 mmHg  Pulse 72  Ht 5\' 6"  (1.676 m)  Wt 113 lb (51.256 kg)  BMI 18.25 kg/m2  SpO2 98% , BMI Body mass index is 18.25 kg/(m^2). GEN: Well nourished, well developed, in no acute distress HEENT: normal Neck: no JVD, carotid bruits, or masses Cardiac: RRR; no murmurs, rubs, or gallops,no edema  Respiratory:  Clear to auscultation bilaterally, normal  work of breathing GI: soft, nontender, nondistended, + BS MS: no deformity or atrophy Skin: warm and dry, no rash Neuro:  Strength and sensation are intact Psych: euthymic mood, full affect   Recent Labs: 04/26/2014: ALT 23; BUN 11; Creatinine 0.7; Hemoglobin 14.6; Platelets 336.0; Potassium 4.9; Sodium 141; TSH 1.17    Lipid Panel    Component Value Date/Time   CHOL 242* 04/26/2014 1305   TRIG 132.0  04/26/2014 1305   TRIG 91 04/14/2011   HDL 50.20 04/26/2014 1305   CHOLHDL 5 04/26/2014 1305   VLDL 26.4 04/26/2014 1305   LDLCALC 165* 04/26/2014 1305   LDLDIRECT 136.3 04/12/2013 0930      Wt Readings from Last 3 Encounters:  12/10/14 113 lb (51.256 kg)  06/07/14 108 lb (48.988 kg)  04/26/14 110 lb 4 oz (50.009 kg)      Other studies Reviewed: Additional studies/ records that were reviewed today include: None Review of the above records demonstrates: N/A   ASSESSMENT AND PLAN:  1. Atrial fibrillation: Will make no changes in medication regimen as she is asymptomatic. She will have refills on Plendil. She will see coumadin clinic as directed.   2. Hypertension: She is slightly elevated today. She has not yet taken her antihypertensive medication. She will do so when she returns home. She is encouraged to take it at the same time every day. Refills are provided.   Current medicines are reviewed at length with the patient today.    Labs/ tests ordered today includeNo orders of the defined types were placed in this encounter.     Disposition:   FU with one year unless symptomatic.   Signed, Jory Sims, NP  12/10/2014 2:33 PM    [facility] [address], [place], [place] [address] Phone: [phone]; Fax: [phone]

## 2014-12-10 NOTE — Patient Instructions (Signed)
Your physician wants you to follow-up in: 1 year with Kathryn Lawrence, NP. You will receive a reminder letter in the mail two months in advance. If you don't receive a letter, please call our office to schedule the follow-up appointment.  Your physician recommends that you continue on your current medications as directed. Please refer to the Current Medication list given to you today.  Thank you for choosing [facility]!   

## 2014-12-12 ENCOUNTER — Ambulatory Visit (INDEPENDENT_AMBULATORY_CARE_PROVIDER_SITE_OTHER): Payer: Medicare Other | Admitting: *Deleted

## 2014-12-12 DIAGNOSIS — Z5181 Encounter for therapeutic drug level monitoring: Secondary | ICD-10-CM

## 2014-12-12 DIAGNOSIS — Z7901 Long term (current) use of anticoagulants: Secondary | ICD-10-CM

## 2014-12-12 DIAGNOSIS — I481 Persistent atrial fibrillation: Secondary | ICD-10-CM

## 2014-12-12 DIAGNOSIS — I4819 Other persistent atrial fibrillation: Secondary | ICD-10-CM

## 2014-12-12 DIAGNOSIS — G459 Transient cerebral ischemic attack, unspecified: Secondary | ICD-10-CM

## 2014-12-12 DIAGNOSIS — I4891 Unspecified atrial fibrillation: Secondary | ICD-10-CM

## 2014-12-12 LAB — POCT INR: INR: 1.9

## 2014-12-24 DIAGNOSIS — H2512 Age-related nuclear cataract, left eye: Secondary | ICD-10-CM | POA: Diagnosis not present

## 2014-12-24 DIAGNOSIS — H3562 Retinal hemorrhage, left eye: Secondary | ICD-10-CM | POA: Diagnosis not present

## 2014-12-24 DIAGNOSIS — H3532 Exudative age-related macular degeneration: Secondary | ICD-10-CM | POA: Diagnosis not present

## 2014-12-24 DIAGNOSIS — H3531 Nonexudative age-related macular degeneration: Secondary | ICD-10-CM | POA: Diagnosis not present

## 2015-01-09 ENCOUNTER — Ambulatory Visit (INDEPENDENT_AMBULATORY_CARE_PROVIDER_SITE_OTHER): Payer: Medicare Other | Admitting: *Deleted

## 2015-01-09 DIAGNOSIS — I4891 Unspecified atrial fibrillation: Secondary | ICD-10-CM | POA: Diagnosis not present

## 2015-01-09 DIAGNOSIS — Z7901 Long term (current) use of anticoagulants: Secondary | ICD-10-CM | POA: Diagnosis not present

## 2015-01-09 DIAGNOSIS — Z5181 Encounter for therapeutic drug level monitoring: Secondary | ICD-10-CM

## 2015-01-09 DIAGNOSIS — I4819 Other persistent atrial fibrillation: Secondary | ICD-10-CM

## 2015-01-09 DIAGNOSIS — I481 Persistent atrial fibrillation: Secondary | ICD-10-CM

## 2015-01-09 DIAGNOSIS — G459 Transient cerebral ischemic attack, unspecified: Secondary | ICD-10-CM | POA: Diagnosis not present

## 2015-01-09 LAB — POCT INR: INR: 2.2

## 2015-01-15 DIAGNOSIS — E119 Type 2 diabetes mellitus without complications: Secondary | ICD-10-CM | POA: Diagnosis not present

## 2015-01-15 DIAGNOSIS — H2512 Age-related nuclear cataract, left eye: Secondary | ICD-10-CM | POA: Diagnosis not present

## 2015-01-15 DIAGNOSIS — H40013 Open angle with borderline findings, low risk, bilateral: Secondary | ICD-10-CM | POA: Diagnosis not present

## 2015-01-15 DIAGNOSIS — Z961 Presence of intraocular lens: Secondary | ICD-10-CM | POA: Diagnosis not present

## 2015-01-15 DIAGNOSIS — H2589 Other age-related cataract: Secondary | ICD-10-CM | POA: Diagnosis not present

## 2015-01-15 DIAGNOSIS — H3531 Nonexudative age-related macular degeneration: Secondary | ICD-10-CM | POA: Diagnosis not present

## 2015-01-15 LAB — HM DIABETES EYE EXAM

## 2015-01-16 ENCOUNTER — Telehealth: Payer: Self-pay | Admitting: Cardiology

## 2015-01-16 NOTE — Telephone Encounter (Signed)
Patient wants to know if she needs to take antibiotics prior to dental cleaning / tg

## 2015-01-17 NOTE — Telephone Encounter (Signed)
Pt.notified

## 2015-01-17 NOTE — Telephone Encounter (Signed)
No she does not need antibiotics.   Thanks

## 2015-01-22 ENCOUNTER — Encounter: Payer: Self-pay | Admitting: Family Medicine

## 2015-01-25 ENCOUNTER — Ambulatory Visit (INDEPENDENT_AMBULATORY_CARE_PROVIDER_SITE_OTHER): Payer: Medicare Other | Admitting: Family Medicine

## 2015-01-25 ENCOUNTER — Encounter: Payer: Self-pay | Admitting: Family Medicine

## 2015-01-25 VITALS — BP 122/76 | HR 81 | Temp 97.4°F | Wt 114.5 lb

## 2015-01-25 DIAGNOSIS — J029 Acute pharyngitis, unspecified: Secondary | ICD-10-CM | POA: Diagnosis not present

## 2015-01-25 NOTE — Assessment & Plan Note (Signed)
Improved, likely not from the chlorine given the lack of sudden onset of sx at that point.  More likely resolving viral process.  Use saline as needed in meantime and f/u prn.  Nontoxic.  She agrees.

## 2015-01-25 NOTE — Progress Notes (Signed)
Pre visit review using our clinic review tool, if applicable. No additional management support is needed unless otherwise documented below in the visit note.  ST for about 10 days.  Never had a fever.  She is some better.  She had cleaned a pool, used chlorine additive and didn't know if that caused a problem.  She didn't have any new eye sx- she has dry/gritty sensation at baseline.  No FCNAVD.  No ear pain.  Mild HA in the meantime.  No rhinorrhea but some post nasal gtt with throat clearing.  Minimal cough o/w.  No wheeze.  She is better overall now.    Meds, vitals, and allergies reviewed.   ROS: See HPI.  Otherwise, noncontributory.  nad Ncat, conjunctival injection noted B (at baseline per patient) Tm w/o erythema Nasal exam minimally stuffy, no purulent discharge OP wnl, MMM, no erythema.  Neck supple, no LA IRR, not tachy ctab

## 2015-01-25 NOTE — Patient Instructions (Signed)
Gargle with salt water and use nasal saline if needed.  You should gradually improve.  Take care.  Glad to see you.

## 2015-01-31 DIAGNOSIS — L570 Actinic keratosis: Secondary | ICD-10-CM | POA: Diagnosis not present

## 2015-02-05 ENCOUNTER — Encounter: Payer: Self-pay | Admitting: Obstetrics and Gynecology

## 2015-02-05 ENCOUNTER — Ambulatory Visit (INDEPENDENT_AMBULATORY_CARE_PROVIDER_SITE_OTHER): Payer: Medicare Other | Admitting: Obstetrics and Gynecology

## 2015-02-05 ENCOUNTER — Other Ambulatory Visit (HOSPITAL_COMMUNITY)
Admission: RE | Admit: 2015-02-05 | Discharge: 2015-02-05 | Disposition: A | Discharge status: Home / Self Care | Payer: Medicare Other | Source: Ambulatory Visit | Attending: Obstetrics and Gynecology | Admitting: Obstetrics and Gynecology

## 2015-02-05 VITALS — BP 130/70 | Ht 66.0 in

## 2015-02-05 DIAGNOSIS — Z01419 Encounter for gynecological examination (general) (routine) without abnormal findings: Secondary | ICD-10-CM

## 2015-02-05 DIAGNOSIS — Z780 Asymptomatic menopausal state: Secondary | ICD-10-CM | POA: Insufficient documentation

## 2015-02-05 DIAGNOSIS — Z800 Family history of malignant neoplasm of digestive organs: Secondary | ICD-10-CM | POA: Insufficient documentation

## 2015-02-05 DIAGNOSIS — Z87891 Personal history of nicotine dependence: Secondary | ICD-10-CM | POA: Insufficient documentation

## 2015-02-05 DIAGNOSIS — Z124 Encounter for screening for malignant neoplasm of cervix: Secondary | ICD-10-CM | POA: Insufficient documentation

## 2015-02-05 DIAGNOSIS — N816 Rectocele: Secondary | ICD-10-CM | POA: Insufficient documentation

## 2015-02-05 DIAGNOSIS — Z79899 Other long term (current) drug therapy: Secondary | ICD-10-CM | POA: Insufficient documentation

## 2015-02-05 DIAGNOSIS — Z1151 Encounter for screening for human papillomavirus (HPV): Secondary | ICD-10-CM | POA: Insufficient documentation

## 2015-02-05 DIAGNOSIS — E119 Type 2 diabetes mellitus without complications: Secondary | ICD-10-CM | POA: Insufficient documentation

## 2015-02-05 DIAGNOSIS — J449 Chronic obstructive pulmonary disease, unspecified: Secondary | ICD-10-CM | POA: Insufficient documentation

## 2015-02-05 DIAGNOSIS — M810 Age-related osteoporosis without current pathological fracture: Secondary | ICD-10-CM | POA: Insufficient documentation

## 2015-02-05 NOTE — Progress Notes (Signed)
Patient ID: [name], female   DOB: [birth year], 81 y.o.   MRN: [id number]  Chief Complaint  Patient presents with  . Pap and Physical    annual exam     HPI [name] is a 81 y.o. female.  She lives in [place] and [place]. NO c/o   HPI  Past Medical History  Diagnosis Date  . History of TIA (transient ischemic attack)   . Hypothyroidism   . History of rheumatic fever   . Osteoporosis 04/2013    Thoracic compression fracture, on bisphosphonate and cal/vit D  . Atrial fibrillation     Paroxysmal on coumadin  . Mixed hyperlipidemia   . Essential hypertension, benign   . Diabetes type 2, controlled     Diet controlled  . Pulmonary nodules     Stable bilateral on CT 01/2010, rec rpt yearly for 2 yrs, pt decided to stop f/u  . Aortic stenosis   . Anemia   . Arthritis   . History of skin cancer   . Anxiety   . Diverticulosis   . GERD (gastroesophageal reflux disease)   . Seasonal allergies   . History of pelvic fracture April 2013  . COPD (chronic obstructive pulmonary disease)   . Chronic gastritis 10/2009    Duodenitis on EGD  . Hiatal hernia 2011    Small  . Collagen vascular disease   . Glaucoma     Dr. Venetia Maxon  . Dry ARMD     Retinal hemorrhage (09/2013) [place]  . CKD (chronic kidney disease) stage 2, GFR 60-89 ml/min   . Macular degeneration     Past Surgical History  Procedure Laterality Date  . Tonsillectomy    . Cesarean section      (and h/o 6 miscarriages)  . Breast lumpectomy  1983    LEFT, benign  . Patella fracture surgery  05/2006    LEFT surgically repaired with pins and wire  . Dexa  05/2010    T score -4.5 spine, -2.4 femur; does not want to repeat  . Cataract extraction  2012    RIGHT  . Carotid US  2011    mild plaque formation  . Colonoscopy  03/2010    int hemorrhoids, diverticulosis, no need to repeat (Dr. Sydell Axon)  . Esophagogastroduodenoscopy  10/2009    small HH, multiple antric ulcerations s/p biopsy, mild chronic  gastritis/duodenitis  . Esophagogastroduodenoscopy  05/26/2012    RMR: small HH, o/w normal.   . Dilation and curettage of uterus      x2  . Laparoscopic cholecystectomy  02/2013    Dr. Geroge Baseman  . Cholecystectomy N/A 02/27/2013    Procedure: LAPAROSCOPIC CHOLECYSTECTOMY;  Surgeon: Donato Heinz, MD;  Location: [facility];  Service: General;  Laterality: N/A;  . Dexa  04/2013    T score 3.9 [facility] spine, 2.6 hip    Family History  Problem Relation Age of Onset  . Coronary artery disease Mother 27    MI deceased  . Colon cancer Father 28  . Arrhythmia Sister   . Stroke Paternal Grandmother   . Diabetes Neg Hx     Social History History  Substance Use Topics  . Smoking status: Former Smoker -- 1.00 packs/day for 20 years    Types: Cigarettes    Quit date: 10/13/1998  . Smokeless tobacco: Never Used  . Alcohol Use: No    Allergies  Allergen Reactions  . Propoxyphene N-Acetaminophen Shortness Of Breath  . Codeine Nausea And  Vomiting  . Meperidine Hcl Nausea And Vomiting  . Morphine Nausea And Vomiting  . Shellfish Allergy Hives    Current Outpatient Prescriptions  Medication Sig Dispense Refill  . acetaminophen (TYLENOL) 500 MG tablet Take 500 mg by mouth daily as needed.    . brimonidine (ALPHAGAN P) 0.1 % SOLN Place 1 drop into both eyes 2 (two) times daily.     . Calcium-Magnesium-Vitamin D [phone] MG-MG-UNIT TABS Take 1 tablet by mouth 2 (two) times daily.    . clonazePAM (KLONOPIN) 0.5 MG tablet TAKE 1 TABLET BY MOUTH AT BEDTIME AS NEEDED 90 tablet 0  . estradiol (ESTRACE) 0.1 MG/GM vaginal cream Place 2 g vaginally as directed.      . felodipine (PLENDIL) 5 MG 24 hr tablet Take 1 tablet (5 mg total) by mouth daily. 90 tablet 4  . glucose blood test strip Use as instructed--FREESTYLE LITE 100 each 3  . ibandronate (BONIVA) 3 MG/3ML SOLN injection Inject 3 mg into the vein every 3 (three) months.  3.6 mL   . latanoprost (XALATAN) 0.005 % ophthalmic solution Place 1 drop  into both eyes at bedtime.     Marland Kitchen levothyroxine (SYNTHROID, LEVOTHROID) 50 MCG tablet TAKE 1 TABLET DAILY 90 tablet 2  . Magnesium Oxide 500 MG (LAX) TABS Take 1 tablet by mouth 3 (three) times a week.    . Multiple Vitamin (MULTIVITAMIN) capsule Take 1 capsule by mouth every morning.     . Multiple Vitamins-Minerals (ICAPS AREDS FORMULA PO) Take by mouth. Once daily    . Omega-3 Fatty Acids (FISH OIL CONCENTRATE) 1000 MG CAPS Take 2 capsules by mouth 2 (two) times daily.      . polyethylene glycol (MIRALAX / GLYCOLAX) packet Take 17 g by mouth daily. As needed for constipation    . sertraline (ZOLOFT) 50 MG tablet Take 1 tablet (50 mg total) by mouth daily. 90 tablet 3  . warfarin (COUMADIN) 2.5 MG tablet Take 2 tablets daily except 1 tablet on Tuesdays, Thursdays and Saturdays 180 tablet 3  . [DISCONTINUED] Calcium Carbonate-Vitamin D (CALCIUM PLUS VITAMIN D PO) Take 2 tablets by mouth daily.       No current facility-administered medications for this visit.    Review of Systems Review of Systems  Constitutional: Negative.   HENT: Negative.   Cardiovascular: Negative.   Gastrointestinal: Positive for constipation.  Genitourinary: Positive for urgency.  Musculoskeletal: Negative.   Skin: Negative.   Neurological: Negative.   Hematological: Negative.   Psychiatric/Behavioral: Negative.     Blood pressure 130/70, height 5\' 6"  (1.676 m).  Physical Exam Physical Exam  Constitutional: She is oriented to person, place, and time. She appears well-developed and well-nourished.  Appears stated aged  HENT:  Head: Normocephalic and atraumatic.  Eyes: EOM are normal.  Neck: Normal range of motion.  Cardiovascular: Normal rate and regular rhythm.   Pulmonary/Chest: Effort normal and breath sounds normal.  Abdominal: Soft. Bowel sounds are normal.  Genitourinary: Guaiac negative stool. No vaginal discharge found.  Rectocele to 90*above a Tight perineal body  Neurological: She is alert  and oriented to person, place, and time.  Skin: Skin is warm and dry.  Several unchanged AK on back. No scaling or assymmetry.  Psychiatric: She has a normal mood and affect.  hemoccult NEG  Data Reviewed vs Hemoccult neg  Assessment    Annual postmeno exam Rectocele     Plan    Labs thru Dr Lynnae Sandhoff Instructed with med explainer re: rectocele Continue  HT premaring vc 2x /wk prn Consider post repair prn          Reda Gettis V 02/05/2015, 3:56 PM

## 2015-02-05 NOTE — Progress Notes (Signed)
Patient ID: [name], female   DOB: 04/26/1926, 81 y.o.   MRN: [id number] Pt here today for annual exam. Pt denies any problems or concerns at this time.

## 2015-02-06 ENCOUNTER — Ambulatory Visit (INDEPENDENT_AMBULATORY_CARE_PROVIDER_SITE_OTHER): Payer: Medicare Other | Admitting: *Deleted

## 2015-02-06 DIAGNOSIS — Z5181 Encounter for therapeutic drug level monitoring: Secondary | ICD-10-CM | POA: Diagnosis not present

## 2015-02-06 DIAGNOSIS — G459 Transient cerebral ischemic attack, unspecified: Secondary | ICD-10-CM | POA: Diagnosis not present

## 2015-02-06 DIAGNOSIS — I481 Persistent atrial fibrillation: Secondary | ICD-10-CM

## 2015-02-06 DIAGNOSIS — I4891 Unspecified atrial fibrillation: Secondary | ICD-10-CM

## 2015-02-06 DIAGNOSIS — Z7901 Long term (current) use of anticoagulants: Secondary | ICD-10-CM

## 2015-02-06 DIAGNOSIS — I4819 Other persistent atrial fibrillation: Secondary | ICD-10-CM

## 2015-02-06 LAB — POCT INR: INR: 2.4

## 2015-02-06 MED ORDER — WARFARIN SODIUM 2.5 MG PO TABS: ORAL_TABLET | ORAL | Status: DC

## 2015-02-07 LAB — CYTOLOGY - PAP

## 2015-02-11 ENCOUNTER — Other Ambulatory Visit (HOSPITAL_COMMUNITY): Payer: Self-pay | Admitting: *Deleted

## 2015-02-12 ENCOUNTER — Encounter (HOSPITAL_COMMUNITY)
Admission: RE | Admit: 2015-02-12 | Discharge: 2015-02-12 | Disposition: A | Discharge status: Home / Self Care | Payer: Medicare Other | Source: Ambulatory Visit | Attending: Family Medicine | Admitting: Family Medicine

## 2015-02-12 DIAGNOSIS — M810 Age-related osteoporosis without current pathological fracture: Secondary | ICD-10-CM | POA: Insufficient documentation

## 2015-02-12 MED ORDER — IBANDRONATE SODIUM 3 MG/3ML IV SOLN
3.0000 mg | Freq: Once | INTRAVENOUS | Status: AC
  Administered 2015-02-12: 3 mg via INTRAVENOUS

## 2015-02-12 MED ORDER — IBANDRONATE SODIUM 3 MG/3ML IV SOLN
INTRAVENOUS | Status: AC
  Filled 2015-02-12: qty 3

## 2015-03-05 ENCOUNTER — Telehealth: Payer: Self-pay | Admitting: *Deleted

## 2015-03-05 NOTE — Telephone Encounter (Signed)
Please call [name]. She missed a dose of coumdin.

## 2015-03-05 NOTE — Telephone Encounter (Signed)
Pt forgot to take coumadin last night.  Took it this morning when she got up.  Wants to know what to do about coumadin dose tonight.  Told pt to take 1/2 tablet.  She has a coumadin appt tomorrow.  She verbalized understanding.

## 2015-03-06 ENCOUNTER — Ambulatory Visit (INDEPENDENT_AMBULATORY_CARE_PROVIDER_SITE_OTHER): Payer: Medicare Other | Admitting: *Deleted

## 2015-03-06 DIAGNOSIS — Z5181 Encounter for therapeutic drug level monitoring: Secondary | ICD-10-CM | POA: Diagnosis not present

## 2015-03-06 DIAGNOSIS — Z7901 Long term (current) use of anticoagulants: Secondary | ICD-10-CM | POA: Diagnosis not present

## 2015-03-06 DIAGNOSIS — I4819 Other persistent atrial fibrillation: Secondary | ICD-10-CM

## 2015-03-06 DIAGNOSIS — G459 Transient cerebral ischemic attack, unspecified: Secondary | ICD-10-CM

## 2015-03-06 DIAGNOSIS — I481 Persistent atrial fibrillation: Secondary | ICD-10-CM

## 2015-03-06 DIAGNOSIS — I4891 Unspecified atrial fibrillation: Secondary | ICD-10-CM

## 2015-03-06 LAB — POCT INR: INR: 2.2

## 2015-04-12 ENCOUNTER — Other Ambulatory Visit: Payer: Self-pay | Admitting: Family Medicine

## 2015-04-12 NOTE — Telephone Encounter (Signed)
Needs a follow up appt.

## 2015-04-15 ENCOUNTER — Other Ambulatory Visit: Payer: Self-pay | Admitting: Family Medicine

## 2015-04-16 DIAGNOSIS — H35361 Drusen (degenerative) of macula, right eye: Secondary | ICD-10-CM | POA: Diagnosis not present

## 2015-04-16 DIAGNOSIS — H3531 Nonexudative age-related macular degeneration: Secondary | ICD-10-CM | POA: Diagnosis not present

## 2015-04-16 DIAGNOSIS — H2512 Age-related nuclear cataract, left eye: Secondary | ICD-10-CM | POA: Diagnosis not present

## 2015-04-17 ENCOUNTER — Ambulatory Visit (INDEPENDENT_AMBULATORY_CARE_PROVIDER_SITE_OTHER): Payer: Medicare Other | Admitting: *Deleted

## 2015-04-17 DIAGNOSIS — Z7901 Long term (current) use of anticoagulants: Secondary | ICD-10-CM | POA: Diagnosis not present

## 2015-04-17 DIAGNOSIS — I481 Persistent atrial fibrillation: Secondary | ICD-10-CM

## 2015-04-17 DIAGNOSIS — G459 Transient cerebral ischemic attack, unspecified: Secondary | ICD-10-CM

## 2015-04-17 DIAGNOSIS — Z5181 Encounter for therapeutic drug level monitoring: Secondary | ICD-10-CM | POA: Diagnosis not present

## 2015-04-17 DIAGNOSIS — I4891 Unspecified atrial fibrillation: Secondary | ICD-10-CM | POA: Diagnosis not present

## 2015-04-17 DIAGNOSIS — I4819 Other persistent atrial fibrillation: Secondary | ICD-10-CM

## 2015-04-17 LAB — POCT INR: INR: 2.3

## 2015-04-26 ENCOUNTER — Ambulatory Visit (INDEPENDENT_AMBULATORY_CARE_PROVIDER_SITE_OTHER): Payer: Medicare Other

## 2015-04-26 DIAGNOSIS — Z23 Encounter for immunization: Secondary | ICD-10-CM | POA: Diagnosis not present

## 2015-04-29 ENCOUNTER — Other Ambulatory Visit: Payer: Self-pay | Admitting: Obstetrics and Gynecology

## 2015-04-29 DIAGNOSIS — Z1231 Encounter for screening mammogram for malignant neoplasm of breast: Secondary | ICD-10-CM

## 2015-05-07 DIAGNOSIS — L570 Actinic keratosis: Secondary | ICD-10-CM | POA: Diagnosis not present

## 2015-05-07 DIAGNOSIS — L821 Other seborrheic keratosis: Secondary | ICD-10-CM | POA: Diagnosis not present

## 2015-05-28 ENCOUNTER — Other Ambulatory Visit: Payer: Self-pay | Admitting: Family Medicine

## 2015-05-28 NOTE — Telephone Encounter (Signed)
Ok to refill 

## 2015-05-28 NOTE — Telephone Encounter (Signed)
plz phone in. 

## 2015-05-29 ENCOUNTER — Ambulatory Visit (INDEPENDENT_AMBULATORY_CARE_PROVIDER_SITE_OTHER): Payer: Medicare Other | Admitting: *Deleted

## 2015-05-29 DIAGNOSIS — G459 Transient cerebral ischemic attack, unspecified: Secondary | ICD-10-CM | POA: Diagnosis not present

## 2015-05-29 DIAGNOSIS — I4891 Unspecified atrial fibrillation: Secondary | ICD-10-CM | POA: Diagnosis not present

## 2015-05-29 DIAGNOSIS — I4819 Other persistent atrial fibrillation: Secondary | ICD-10-CM

## 2015-05-29 DIAGNOSIS — Z7901 Long term (current) use of anticoagulants: Secondary | ICD-10-CM | POA: Diagnosis not present

## 2015-05-29 DIAGNOSIS — Z5181 Encounter for therapeutic drug level monitoring: Secondary | ICD-10-CM

## 2015-05-29 DIAGNOSIS — I481 Persistent atrial fibrillation: Secondary | ICD-10-CM

## 2015-05-29 LAB — POCT INR: INR: 3.7

## 2015-05-29 NOTE — Telephone Encounter (Signed)
Rx called in as directed.   

## 2015-06-06 ENCOUNTER — Other Ambulatory Visit: Payer: Self-pay | Admitting: *Deleted

## 2015-06-06 ENCOUNTER — Ambulatory Visit (HOSPITAL_COMMUNITY)
Admission: RE | Admit: 2015-06-06 | Discharge: 2015-06-06 | Disposition: A | Discharge status: Home / Self Care | Payer: Medicare Other | Source: Ambulatory Visit | Attending: Obstetrics and Gynecology | Admitting: Obstetrics and Gynecology

## 2015-06-06 DIAGNOSIS — Z1231 Encounter for screening mammogram for malignant neoplasm of breast: Secondary | ICD-10-CM | POA: Diagnosis not present

## 2015-06-06 MED ORDER — SERTRALINE HCL 50 MG PO TABS: ORAL_TABLET | ORAL | Status: DC

## 2015-06-06 NOTE — Telephone Encounter (Signed)
Ok to refill to mail order? Will need written Rx. 

## 2015-06-06 NOTE — Telephone Encounter (Addendum)
Printed and in Kim's box. plz double check with pharmacy - #90 phoned in to [facility] on 05/28/2015

## 2015-06-07 MED ORDER — CLONAZEPAM 0.5 MG PO TABS: 0.5000 mg | ORAL_TABLET | Freq: Every evening | ORAL | Status: DC | PRN

## 2015-06-07 NOTE — Telephone Encounter (Signed)
Faxed order to [facility]. Patient will call to schedule her own appt.

## 2015-06-07 NOTE — Telephone Encounter (Signed)
Filled and in Kim's box. 

## 2015-06-07 NOTE — Telephone Encounter (Signed)
Spoke with patient and she said to disregard the [facility] request. Rx shredded. She said she was overdue for boniva infusion and needed order faxed to [facility]. Order in your IN box for review/completion.

## 2015-06-17 ENCOUNTER — Other Ambulatory Visit (HOSPITAL_COMMUNITY): Payer: Self-pay | Admitting: *Deleted

## 2015-06-18 ENCOUNTER — Ambulatory Visit (HOSPITAL_COMMUNITY)
Admission: RE | Admit: 2015-06-18 | Discharge: 2015-06-18 | Disposition: A | Discharge status: Home / Self Care | Payer: Medicare Other | Source: Ambulatory Visit | Attending: Family Medicine | Admitting: Family Medicine

## 2015-06-18 DIAGNOSIS — M810 Age-related osteoporosis without current pathological fracture: Secondary | ICD-10-CM | POA: Insufficient documentation

## 2015-06-18 MED ORDER — IBANDRONATE SODIUM 3 MG/3ML IV SOLN: 3.0000 mg | Freq: Once | INTRAVENOUS | Status: DC

## 2015-06-18 MED ORDER — IBANDRONATE SODIUM 3 MG/3ML IV SOLN
INTRAVENOUS | Status: AC
  Administered 2015-06-18: 3 mg
  Filled 2015-06-18: qty 3

## 2015-06-18 MED ORDER — SODIUM CHLORIDE 0.9 % IV SOLN: Freq: Once | INTRAVENOUS | Status: DC

## 2015-06-18 NOTE — Discharge Instructions (Signed)
Ibandronate injection °What is this medicine? °IBANDRONATE (i BAN droh nate) slows calcium loss from bones. It is used to treat osteoporosis in women past the age of menopause. °This medicine may be used for other purposes; ask your health care provider or pharmacist if you have questions. °What should I tell my health care provider before I take this medicine? °They need to know if you have any of these conditions: °-dental disease °-kidney disease °-low levels of calcium in the blood °-low levels of vitamin D in the blood °-an unusual or allergic reaction to ibandronate, other medicines, foods, dyes, or preservatives °-pregnant or trying to get pregnant °-breast-feeding °How should I use this medicine? °This medicine is for injection into a vein. It is given by a health care professional in a hospital or clinic setting. °Talk to your pediatrician regarding the use of this medicine in children. Special care may be needed. °Overdosage: If you think you have taken too much of this medicine contact a poison control center or emergency room at once. °NOTE: This medicine is only for you. Do not share this medicine with others. °What if I miss a dose? °It is important not to miss your dose. Call your doctor or health care professional if you are unable to keep an appointment. °What may interact with this medicine? °-teriparatide °This list may not describe all possible interactions. Give your health care provider a list of all the medicines, herbs, non-prescription drugs, or dietary supplements you use. Also tell them if you smoke, drink alcohol, or use illegal drugs. Some items may interact with your medicine. °What should I watch for while using this medicine? °Visit your doctor or health care professional for regular check ups. It may be some time before you see the benefit from this medicine. Do not stop taking your medicine except on your doctor's advice. Your doctor or health care professional may order blood tests  and other tests to see how you are doing. °You should make sure you get enough calcium and vitamin D while you are taking this medicine, unless your doctor tells you not to. Discuss the foods you eat and the vitamins you take with your health care professional. °Some people who take this medicine have severe bone, joint, and/or muscle pain. This medicine may also increase your risk for a broken thigh bone. Tell your doctor right away if you have pain in your upper leg or groin. Tell your doctor if you have any pain that does not go away or that gets worse. °What side effects may I notice from receiving this medicine? °Side effects that you should report to your doctor or health care professional as soon as possible: °-allergic reactions such as skin rash or itching, hives, swelling of the face, lips, throat, or tongue °-changes in vision °-chest pain °-fever, flu-like symptoms °-heartburn or stomach pain °-jaw pain, especially after dental work °Side effects that usually do not require medical attention (report to your doctor or health care professional if they continue or are bothersome): °-bone, muscle or joint pain °-diarrhea or constipation °-eye pain or itching °-headache °-irritation at site where injected °-nausea °This list may not describe all possible side effects. Call your doctor for medical advice about side effects. You may report side effects to FDA at 1-800-FDA-1088. °Where should I keep my medicine? °This drug is given in a hospital or clinic and will not be stored at home. °NOTE: This sheet is a summary. It may not cover all possible information.   If you have questions about this medicine, talk to your doctor, pharmacist, or health care provider. °  °© 2016, [employer]vier/[employer]. (2011-01-02 09:02:20) ° °

## 2015-06-19 ENCOUNTER — Ambulatory Visit (INDEPENDENT_AMBULATORY_CARE_PROVIDER_SITE_OTHER): Payer: Medicare Other | Admitting: *Deleted

## 2015-06-19 DIAGNOSIS — I4891 Unspecified atrial fibrillation: Secondary | ICD-10-CM | POA: Diagnosis not present

## 2015-06-19 DIAGNOSIS — Z5181 Encounter for therapeutic drug level monitoring: Secondary | ICD-10-CM

## 2015-06-19 DIAGNOSIS — G459 Transient cerebral ischemic attack, unspecified: Secondary | ICD-10-CM

## 2015-06-19 DIAGNOSIS — Z7901 Long term (current) use of anticoagulants: Secondary | ICD-10-CM

## 2015-06-19 DIAGNOSIS — I481 Persistent atrial fibrillation: Secondary | ICD-10-CM

## 2015-06-19 DIAGNOSIS — I4819 Other persistent atrial fibrillation: Secondary | ICD-10-CM

## 2015-06-19 LAB — POCT INR: INR: 2.3

## 2015-06-21 ENCOUNTER — Ambulatory Visit (INDEPENDENT_AMBULATORY_CARE_PROVIDER_SITE_OTHER): Payer: Medicare Other | Admitting: Family Medicine

## 2015-06-21 ENCOUNTER — Encounter: Payer: Self-pay | Admitting: Family Medicine

## 2015-06-21 VITALS — BP 138/78 | HR 73 | Temp 97.6°F | Wt 118.5 lb

## 2015-06-21 DIAGNOSIS — M545 Low back pain: Secondary | ICD-10-CM

## 2015-06-21 NOTE — Patient Instructions (Signed)
Likely a muscle strain, possible SI joint irritation.  I would try icing 77min on, 60min off.  Gently stretch before getting up.  Update [place] as needed.  Take care.  Glad to see you.

## 2015-06-21 NOTE — Progress Notes (Signed)
Pre visit review using our clinic review tool, if applicable. No additional management support is needed unless otherwise documented below in the visit note.  About 5 days ago, had pain near L hip/buttock area.  Pain getting out of bed initially.  No trauma.  Got some better as the day went on.  "I creep around with a cane from the pain."  No pain sitting.  No bruising.  No R sided pain.   She has chronic intermittent back pain.  She prev felt a radicular pain once in the L leg but not o/w.  No rash.  No interventions as home yet.  She has zero pain at rest or when still.  She can still bear weight.    Meds, vitals, and allergies reviewed.   ROS: See HPI.  Otherwise, noncontributory.  nad ncat rrr ctab abd soft Back w/o midline pain but L SI ttp and on testing.  Normal L hip rom o/w   SLR neg.  S/s wnl BLE

## 2015-06-23 NOTE — Assessment & Plan Note (Signed)
Likely a muscle strain, with possible SI joint irritation.  Would try icing 43min on, 14min off.  Gently stretch before getting up.  Update [place] as needed.  Not at the point of needing pain meds.  She agrees.

## 2015-06-24 ENCOUNTER — Other Ambulatory Visit: Payer: Self-pay | Admitting: Family Medicine

## 2015-06-25 ENCOUNTER — Telehealth: Payer: Self-pay | Admitting: *Deleted

## 2015-06-25 MED ORDER — TRAMADOL HCL 50 MG PO TABS: 50.0000 mg | ORAL_TABLET | Freq: Two times a day (BID) | ORAL | Status: DC | PRN

## 2015-06-25 NOTE — Telephone Encounter (Signed)
Patient left a voicemail that she was seen Friday with hip pain. Patient stated that it is getting some better, but has decided that she would like some pain medication for it. Pharmacy [facility]

## 2015-06-25 NOTE — Telephone Encounter (Signed)
Medication phoned to pharmacy. Patient advised.  

## 2015-06-25 NOTE — Telephone Encounter (Signed)
Patient went to the [facility] in [place] and they didn't have the prescription.  Patient can be reached at [phone].

## 2015-06-25 NOTE — Telephone Encounter (Signed)
Phoned [facility] and the prescription was there and ready for pickup.  Attendant says it may have been in the work que when the patient came by.  Patient advised.

## 2015-06-25 NOTE — Telephone Encounter (Signed)
Please call in tramadol, use sparingly.  Thanks.

## 2015-07-04 ENCOUNTER — Ambulatory Visit (INDEPENDENT_AMBULATORY_CARE_PROVIDER_SITE_OTHER): Payer: Medicare Other | Admitting: Family Medicine

## 2015-07-04 ENCOUNTER — Encounter: Payer: Self-pay | Admitting: Radiology

## 2015-07-04 ENCOUNTER — Encounter: Payer: Self-pay | Admitting: Family Medicine

## 2015-07-04 VITALS — BP 132/82 | HR 76 | Temp 97.3°F | Wt 123.5 lb

## 2015-07-04 DIAGNOSIS — G5702 Lesion of sciatic nerve, left lower limb: Secondary | ICD-10-CM | POA: Diagnosis not present

## 2015-07-04 DIAGNOSIS — Z79899 Other long term (current) drug therapy: Secondary | ICD-10-CM | POA: Diagnosis not present

## 2015-07-04 MED ORDER — PREDNISONE 20 MG PO TABS: ORAL_TABLET | ORAL | Status: DC

## 2015-07-04 NOTE — Assessment & Plan Note (Signed)
Discussed with patient. Recommend prednisone course, continue tylenol (stop tramadol) and provided with exercises from [facility] pt advisor. If not improving with treatment, will refer to PT. Advised update [place] if worsening for possible imaging.

## 2015-07-04 NOTE — Progress Notes (Signed)
Pre visit review using our clinic review tool, if applicable. No additional management support is needed unless otherwise documented below in the visit note. 

## 2015-07-04 NOTE — Progress Notes (Signed)
BP 132/82 mmHg  Pulse 76  Temp(Src) 97.3 F (36.3 C) (Oral)  Wt 123 lb 8 oz (56.019 kg)   CC: f/u hip pain  Subjective:    Patient ID: [name], female    DOB: 06-29-1926, 81 y.o.   MRN: [id number]  HPI: [name] is a 81 y.o. female presenting on 07/04/2015 for Follow-up   Seen here by Dr Damita Dunnings 2 wks ago with L lower back pain attributed to muscle strain with possible SIJ irritation. rec icing, gentle stretching. Also provided with tramadol for pain which helped. Not better. Tramadol is not helping. Tylenol does help but she only takes once daily.  "It's my hip". Worse with walking and standing. No pain with laying down or sitting down. No shooting pain down leg. No significant lower back pain. Points to L buttock. May have started after she increased treadmill walking. Initially sharp shooting pain down leg, now more dull grabbing pain that stays at left buttock.   She has started using walker at home because of unsteadiness.   Relevant past medical, surgical, family and social history reviewed and updated as indicated. Interim medical history since our last visit reviewed. Allergies and medications reviewed and updated. Current Outpatient Prescriptions on File Prior to Visit  Medication Sig  . brimonidine (ALPHAGAN P) 0.1 % SOLN Place 1 drop into both eyes 2 (two) times daily.   . Calcium-Magnesium-Vitamin D [id number] MG-MG-UNIT TABS Take 1 tablet by mouth 2 (two) times daily.  . clonazePAM (KLONOPIN) 0.5 MG tablet Take 1 tablet (0.5 mg total) by mouth at bedtime as needed.  . felodipine (PLENDIL) 5 MG 24 hr tablet Take 1 tablet (5 mg total) by mouth daily.  Marland Kitchen glucose blood test strip Use as instructed--FREESTYLE LITE  . ibandronate (BONIVA) 3 MG/3ML SOLN injection Inject 3 mg into the vein every 3 (three) months.   . latanoprost (XALATAN) 0.005 % ophthalmic solution Place 1 drop into both eyes at bedtime.   Marland Kitchen levothyroxine (SYNTHROID, LEVOTHROID) 50 MCG  tablet TAKE 1 TABLET DAILY  . Magnesium Oxide 500 MG (LAX) TABS Take 1 tablet by mouth 3 (three) times a week.  . Multiple Vitamin (MULTIVITAMIN) capsule Take 1 capsule by mouth every morning.   . Multiple Vitamins-Minerals (ICAPS AREDS FORMULA PO) Take by mouth. Once daily  . Omega-3 Fatty Acids (FISH OIL CONCENTRATE) 1000 MG CAPS Take 2 capsules by mouth 2 (two) times daily.    . polyethylene glycol (MIRALAX / GLYCOLAX) packet Take 17 g by mouth daily. As needed for constipation  . sertraline (ZOLOFT) 50 MG tablet TAKE 1 TABLET (50 MG TOTAL) BY MOUTH DAILY. **MUST HAVE PHYSICAL FOR FURTHER REFILLS**  . warfarin (COUMADIN) 2.5 MG tablet Take 2 tablets daily except 1 tablet on Sundays, Tuesdays and Thursdays  . [DISCONTINUED] Calcium Carbonate-Vitamin D (CALCIUM PLUS VITAMIN D PO) Take 2 tablets by mouth daily.     No current facility-administered medications on file prior to visit.    Review of Systems Per HPI unless specifically indicated in ROS section     Objective:    BP 132/82 mmHg  Pulse 76  Temp(Src) 97.3 F (36.3 C) (Oral)  Wt 123 lb 8 oz (56.019 kg)  Wt Readings from Last 3 Encounters:  07/04/15 123 lb 8 oz (56.019 kg)  06/21/15 118 lb 8 oz (53.751 kg)  02/12/15 115 lb (52.164 kg)   Body mass index is 19.94 kg/(m^2).  Physical Exam  Constitutional: She appears well-developed and well-nourished. No  distress.  Musculoskeletal: She exhibits no edema.  No pain midline spine No paraspinous mm tenderness Neg SLR bilaterally. No pain with int/ext rotation at hip. Neg FABER. No pain at SIJ, GTB  ++ pain at left sciatic notch  Psychiatric: She has a normal mood and affect.  Nursing note and vitals reviewed.  Results for orders placed or performed in visit on 06/19/15  POCT INR  Result Value Ref Range   INR 2.3       Assessment & Plan:   Problem List Items Addressed This Visit    Piriformis syndrome of left side - Primary    Discussed with patient. Recommend  prednisone course, continue tylenol (stop tramadol) and provided with exercises from [facility] pt advisor. If not improving with treatment, will refer to PT. Advised update [place] if worsening for possible imaging.          Follow up plan: No Follow-up on file.

## 2015-07-04 NOTE — Patient Instructions (Addendum)
UDS today. I do think you have sciatica. Treat with prednisone course and may take tylenol as needed for pain (up to 3 a day). Do exercises provided today. If no better after 1 week let me know for PT referral. If worsening pain or any weakness of that left leg, let us know right away.

## 2015-07-17 ENCOUNTER — Telehealth: Payer: Self-pay

## 2015-07-17 ENCOUNTER — Ambulatory Visit (INDEPENDENT_AMBULATORY_CARE_PROVIDER_SITE_OTHER): Payer: Medicare Other | Admitting: *Deleted

## 2015-07-17 DIAGNOSIS — G459 Transient cerebral ischemic attack, unspecified: Secondary | ICD-10-CM | POA: Diagnosis not present

## 2015-07-17 DIAGNOSIS — I4819 Other persistent atrial fibrillation: Secondary | ICD-10-CM

## 2015-07-17 DIAGNOSIS — Z5181 Encounter for therapeutic drug level monitoring: Secondary | ICD-10-CM

## 2015-07-17 DIAGNOSIS — Z7901 Long term (current) use of anticoagulants: Secondary | ICD-10-CM

## 2015-07-17 DIAGNOSIS — I481 Persistent atrial fibrillation: Secondary | ICD-10-CM

## 2015-07-17 DIAGNOSIS — I4891 Unspecified atrial fibrillation: Secondary | ICD-10-CM

## 2015-07-17 LAB — POCT INR: INR: 3.3

## 2015-07-17 MED ORDER — NYSTATIN 100000 UNIT/ML MT SUSP: 5.0000 mL | Freq: Three times a day (TID) | OROMUCOSAL | Status: DC

## 2015-07-17 NOTE — Telephone Encounter (Signed)
plz notify nystatin swish/swallow sent in for patient. Update if persistent thrush after treatment.

## 2015-07-17 NOTE — Telephone Encounter (Signed)
Pt left v/m; pt having thrush since taking prednisone; pt seen 07/04/15. Pt request med sent to [facility] in [place]. FYI that hip is much better since taking the prednisone. Pt request cb.

## 2015-07-18 ENCOUNTER — Encounter: Payer: Self-pay | Admitting: *Deleted

## 2015-07-18 ENCOUNTER — Encounter: Payer: Self-pay | Admitting: Family Medicine

## 2015-07-18 NOTE — Telephone Encounter (Signed)
Patient notified and verbalized understanding. 

## 2015-07-25 ENCOUNTER — Telehealth: Payer: Self-pay | Admitting: *Deleted

## 2015-07-25 NOTE — Telephone Encounter (Signed)
FYI : patient states that she is now on Mystatin Oral Suspension / tg

## 2015-07-25 NOTE — Telephone Encounter (Signed)
Pt has been on Dukes Mixture oral tid x 5 days.  Has 1/2 bottle left.  Reviewed chart.  INR was checked 12/29 and coumadin was held one night.  Told pt to continue current dose of coumadin and eat extra greens/salads while on med.  She was instructed to call if she develops any increased bruising or s/s of bleeding.  She verbalized understanding.  INR appt 08/07/15

## 2015-07-30 ENCOUNTER — Telehealth: Payer: Self-pay | Admitting: *Deleted

## 2015-07-30 NOTE — Telephone Encounter (Signed)
Pt aware of results of pap done last year

## 2015-07-31 NOTE — Telephone Encounter (Addendum)
Pt used the nystatin and tongue cleared up but now tongue has white coating again and sore. Pt wanted to know what to do. Pt has medicare wellness appt on 08/08/15. [facility] [place]. Pt request cb. Pt is aware Dr Darnell Level out of office this afternoon and cb 08/01/15 is OK.

## 2015-08-01 ENCOUNTER — Other Ambulatory Visit (INDEPENDENT_AMBULATORY_CARE_PROVIDER_SITE_OTHER): Payer: Medicare Other

## 2015-08-01 ENCOUNTER — Other Ambulatory Visit: Payer: Self-pay | Admitting: Family Medicine

## 2015-08-01 DIAGNOSIS — R634 Abnormal weight loss: Secondary | ICD-10-CM | POA: Diagnosis not present

## 2015-08-01 DIAGNOSIS — I481 Persistent atrial fibrillation: Secondary | ICD-10-CM

## 2015-08-01 DIAGNOSIS — E039 Hypothyroidism, unspecified: Secondary | ICD-10-CM

## 2015-08-01 DIAGNOSIS — M810 Age-related osteoporosis without current pathological fracture: Secondary | ICD-10-CM | POA: Diagnosis not present

## 2015-08-01 DIAGNOSIS — I4819 Other persistent atrial fibrillation: Secondary | ICD-10-CM

## 2015-08-01 DIAGNOSIS — E785 Hyperlipidemia, unspecified: Secondary | ICD-10-CM | POA: Diagnosis not present

## 2015-08-01 DIAGNOSIS — E119 Type 2 diabetes mellitus without complications: Secondary | ICD-10-CM

## 2015-08-01 DIAGNOSIS — I10 Essential (primary) hypertension: Secondary | ICD-10-CM

## 2015-08-01 LAB — COMPREHENSIVE METABOLIC PANEL
ALT: 14 U/L (ref 0–35)
AST: 19 U/L (ref 0–37)
Albumin: 3.6 g/dL (ref 3.5–5.2)
Alkaline Phosphatase: 75 U/L (ref 39–117)
BUN: 12 mg/dL (ref 6–23)
CHLORIDE: 107 meq/L (ref 96–112)
CO2: 31 mEq/L (ref 19–32)
Calcium: 9.2 mg/dL (ref 8.4–10.5)
Creatinine, Ser: 0.73 mg/dL (ref 0.40–1.20)
GFR: 79.98 mL/min (ref >60.00)
Glucose, Bld: 96 mg/dL (ref 70–99)
POTASSIUM: 4 meq/L (ref 3.5–5.1)
Sodium: 143 mEq/L (ref 135–145)
TOTAL PROTEIN: 6.3 g/dL (ref 6.0–8.3)
Total Bilirubin: 0.8 mg/dL (ref 0.2–1.2)

## 2015-08-01 LAB — CBC WITH DIFFERENTIAL/PLATELET
Basophils Absolute: 0 10*3/uL (ref 0.0–0.1)
Basophils Relative: 0.6 % (ref 0.0–3.0)
EOS ABS: 0.2 10*3/uL (ref 0.0–0.7)
EOS PCT: 3.3 % (ref 0.0–5.0)
HCT: 37.5 % (ref 36.0–46.0)
Hemoglobin: 12.3 g/dL (ref 12.0–15.0)
LYMPHS ABS: 1.9 10*3/uL (ref 0.7–4.0)
Lymphocytes Relative: 29.5 % (ref 12.0–46.0)
MCHC: 32.7 g/dL (ref 30.0–36.0)
MCV: 102.8 fl — ABNORMAL HIGH (ref 78.0–100.0)
MONO ABS: 0.8 10*3/uL (ref 0.1–1.0)
MONOS PCT: 12.7 % — AB (ref 3.0–12.0)
NEUTROS ABS: 3.4 10*3/uL (ref 1.4–7.7)
NEUTROS PCT: 53.9 % (ref 43.0–77.0)
Platelets: 330 10*3/uL (ref 150.0–400.0)
RBC: 3.65 Mil/uL — AB (ref 3.87–5.11)
RDW: 14.6 % (ref 11.5–15.5)
WBC: 6.3 10*3/uL (ref 4.0–10.5)

## 2015-08-01 LAB — TSH: TSH: 2.94 u[IU]/mL (ref 0.35–4.50)

## 2015-08-01 LAB — VITAMIN D 25 HYDROXY (VIT D DEFICIENCY, FRACTURES): VITD: 52.9 ng/mL (ref 30.00–100.00)

## 2015-08-01 LAB — LIPID PANEL
CHOLESTEROL: 174 mg/dL (ref 0–200)
HDL: 49 mg/dL (ref >39.00)
LDL CALC: 109 mg/dL — AB (ref 0–99)
NonHDL: 124.58
Total CHOL/HDL Ratio: 4
Triglycerides: 76 mg/dL (ref 0.0–149.0)
VLDL: 15.2 mg/dL (ref 0.0–40.0)

## 2015-08-01 LAB — HEMOGLOBIN A1C: Hgb A1c MFr Bld: 6.2 % (ref 4.6–6.5)

## 2015-08-01 MED ORDER — NYSTATIN 100000 UNIT/ML MT SUSP: 5.0000 mL | Freq: Three times a day (TID) | OROMUCOSAL | Status: DC

## 2015-08-01 NOTE — Telephone Encounter (Signed)
Patient notified and verbalized understanding. 

## 2015-08-01 NOTE — Addendum Note (Signed)
Addended by: Ria Bush on: 08/01/2015 07:16 AM   Modules accepted: Orders

## 2015-08-01 NOTE — Telephone Encounter (Signed)
Would treat with another course of nystatin sent to pharmacy. Will recheck at CPE

## 2015-08-02 ENCOUNTER — Encounter: Payer: Self-pay | Admitting: Family Medicine

## 2015-08-07 ENCOUNTER — Ambulatory Visit (INDEPENDENT_AMBULATORY_CARE_PROVIDER_SITE_OTHER): Payer: Medicare Other | Admitting: Pharmacist

## 2015-08-07 DIAGNOSIS — Z7901 Long term (current) use of anticoagulants: Secondary | ICD-10-CM | POA: Diagnosis not present

## 2015-08-07 DIAGNOSIS — I4891 Unspecified atrial fibrillation: Secondary | ICD-10-CM

## 2015-08-07 DIAGNOSIS — G459 Transient cerebral ischemic attack, unspecified: Secondary | ICD-10-CM | POA: Diagnosis not present

## 2015-08-07 DIAGNOSIS — Z5181 Encounter for therapeutic drug level monitoring: Secondary | ICD-10-CM

## 2015-08-07 DIAGNOSIS — I4819 Other persistent atrial fibrillation: Secondary | ICD-10-CM

## 2015-08-07 DIAGNOSIS — I481 Persistent atrial fibrillation: Secondary | ICD-10-CM

## 2015-08-07 LAB — POCT INR: INR: 2.1

## 2015-08-08 ENCOUNTER — Ambulatory Visit (INDEPENDENT_AMBULATORY_CARE_PROVIDER_SITE_OTHER): Payer: Medicare Other | Admitting: Family Medicine

## 2015-08-08 ENCOUNTER — Encounter: Payer: Self-pay | Admitting: Family Medicine

## 2015-08-08 VITALS — BP 124/84 | HR 80 | Temp 97.4°F | Wt 122.5 lb

## 2015-08-08 DIAGNOSIS — F418 Other specified anxiety disorders: Secondary | ICD-10-CM

## 2015-08-08 DIAGNOSIS — E039 Hypothyroidism, unspecified: Secondary | ICD-10-CM

## 2015-08-08 DIAGNOSIS — E119 Type 2 diabetes mellitus without complications: Secondary | ICD-10-CM

## 2015-08-08 DIAGNOSIS — K219 Gastro-esophageal reflux disease without esophagitis: Secondary | ICD-10-CM

## 2015-08-08 DIAGNOSIS — Z0000 Encounter for general adult medical examination without abnormal findings: Secondary | ICD-10-CM | POA: Diagnosis not present

## 2015-08-08 DIAGNOSIS — E785 Hyperlipidemia, unspecified: Secondary | ICD-10-CM

## 2015-08-08 DIAGNOSIS — M810 Age-related osteoporosis without current pathological fracture: Secondary | ICD-10-CM

## 2015-08-08 DIAGNOSIS — K146 Glossodynia: Secondary | ICD-10-CM | POA: Insufficient documentation

## 2015-08-08 DIAGNOSIS — R634 Abnormal weight loss: Secondary | ICD-10-CM

## 2015-08-08 DIAGNOSIS — I10 Essential (primary) hypertension: Secondary | ICD-10-CM

## 2015-08-08 DIAGNOSIS — I4819 Other persistent atrial fibrillation: Secondary | ICD-10-CM

## 2015-08-08 DIAGNOSIS — D7589 Other specified diseases of blood and blood-forming organs: Secondary | ICD-10-CM

## 2015-08-08 DIAGNOSIS — Z7189 Other specified counseling: Secondary | ICD-10-CM

## 2015-08-08 MED ORDER — POLYETHYLENE GLYCOL 3350 17 G PO PACK: 17.0000 g | PACK | Freq: Every day | ORAL | Status: DC

## 2015-08-08 MED ORDER — FREESTYLE LANCETS MISC: Status: DC

## 2015-08-08 NOTE — Assessment & Plan Note (Signed)
Chronic, improved off med. Only taking fish oil 2 capsules BID. Has been more compliant with diabetic diet.

## 2015-08-08 NOTE — Assessment & Plan Note (Signed)
Macrocytosis with monocytosis. ?vit deficiency related. Check vitamins next visit, as well as periph smear.

## 2015-08-08 NOTE — Assessment & Plan Note (Signed)
Chronic, stable. Continue current levothyroxine dose.  

## 2015-08-08 NOTE — Progress Notes (Signed)
Pre visit review using our clinic review tool, if applicable. No additional management support is needed unless otherwise documented below in the visit note. 

## 2015-08-08 NOTE — Assessment & Plan Note (Signed)
Chronic, stable. Continue pendil 5mg  daily.

## 2015-08-08 NOTE — Patient Instructions (Addendum)
Add on b12 539mg daily.over the counter.  We will refer you to audiologist to check hearing. Continue boniva every 3 months. Return as needed or in 6 months for follow up visit. We will check labs for vitamins.   Health Maintenance, Female Adopting a healthy lifestyle and getting preventive care can go a long way to promote health and wellness. Talk with your health care provider about what schedule of regular examinations is right for you. This is a good chance for you to check in with your provider about disease prevention and staying healthy. In between checkups, there are plenty of things you can do on your own. Experts have done a lot of research about which lifestyle changes and preventive measures are most likely to keep you healthy. Ask your health care provider for more information. WEIGHT AND DIET  Eat a healthy diet  Be sure to include plenty of vegetables, fruits, low-fat dairy products, and lean protein.  Do not eat a lot of foods high in solid fats, added sugars, or salt.  Get regular exercise. This is one of the most important things you can do for your health.  Most adults should exercise for at least 150 minutes each week. The exercise should increase your heart rate and make you sweat (moderate-intensity exercise).  Most adults should also do strengthening exercises at least twice a week. This is in addition to the moderate-intensity exercise.  Maintain a healthy weight  Body mass index (BMI) is a measurement that can be used to identify possible weight problems. It estimates body fat based on height and weight. Your health care provider can help determine your BMI and help you achieve or maintain a healthy weight.  For females 217years of age and older:   A BMI below 18.5 is considered underweight.  A BMI of 18.5 to 24.9 is normal.  A BMI of 25 to 29.9 is considered overweight.  A BMI of 30 and above is considered obese.  Watch levels of cholesterol and blood  lipids  You should start having your blood tested for lipids and cholesterol at 80years of age, then have this test every 5 years.  You may need to have your cholesterol levels checked more often if:  Your lipid or cholesterol levels are high.  You are older than 80years of age.  You are at high risk for heart disease.  CANCER SCREENING   Lung Cancer  Lung cancer screening is recommended for adults 553859years old who are at high risk for lung cancer because of a history of smoking.  A yearly low-dose CT scan of the lungs is recommended for people who:  Currently smoke.  Have quit within the past 15 years.  Have at least a 30-pack-year history of smoking. A pack year is smoking an average of one pack of cigarettes a day for 1 year.  Yearly screening should continue until it has been 15 years since you quit.  Yearly screening should stop if you develop a health problem that would prevent you from having lung cancer treatment.  Breast Cancer  Practice breast self-awareness. This means understanding how your breasts normally appear and feel.  It also means doing regular breast self-exams. Let your health care provider know about any changes, no matter how small.  If you are in your 20s or 30s, you should have a clinical breast exam (CBE) by a health care provider every 1-3 years as part of a regular health exam.  If  you are 40 or older, have a CBE every year. Also consider having a breast X-ray (mammogram) every year.  If you have a family history of breast cancer, talk to your health care provider about genetic screening.  If you are at high risk for breast cancer, talk to your health care provider about having an MRI and a mammogram every year.  Breast cancer gene (BRCA) assessment is recommended for women who have family members with BRCA-related cancers. BRCA-related cancers include:  Breast.  Ovarian.  Tubal.  Peritoneal cancers.  Results of the assessment  will determine the need for genetic counseling and BRCA1 and BRCA2 testing. Cervical Cancer Your health care provider may recommend that you be screened regularly for cancer of the pelvic organs (ovaries, uterus, and vagina). This screening involves a pelvic examination, including checking for microscopic changes to the surface of your cervix (Pap test). You may be encouraged to have this screening done every 3 years, beginning at age 4.  For women ages 41-65, health care providers may recommend pelvic exams and Pap testing every 3 years, or they may recommend the Pap and pelvic exam, combined with testing for human papilloma virus (HPV), every 5 years. Some types of HPV increase your risk of cervical cancer. Testing for HPV may also be done on women of any age with unclear Pap test results.  Other health care providers may not recommend any screening for nonpregnant women who are considered low risk for pelvic cancer and who do not have symptoms. Ask your health care provider if a screening pelvic exam is right for you.  If you have had past treatment for cervical cancer or a condition that could lead to cancer, you need Pap tests and screening for cancer for at least 20 years after your treatment. If Pap tests have been discontinued, your risk factors (such as having a new sexual partner) need to be reassessed to determine if screening should resume. Some women have medical problems that increase the chance of getting cervical cancer. In these cases, your health care provider may recommend more frequent screening and Pap tests. Colorectal Cancer  This type of cancer can be detected and often prevented.  Routine colorectal cancer screening usually begins at 80 years of age and continues through 80 years of age.  Your health care provider may recommend screening at an earlier age if you have risk factors for colon cancer.  Your health care provider may also recommend using home test kits to check  for hidden blood in the stool.  A small camera at the end of a tube can be used to examine your colon directly (sigmoidoscopy or colonoscopy). This is done to check for the earliest forms of colorectal cancer.  Routine screening usually begins at age 46.  Direct examination of the colon should be repeated every 5-10 years through 80 years of age. However, you may need to be screened more often if early forms of precancerous polyps or small growths are found. Skin Cancer  Check your skin from head to toe regularly.  Tell your health care provider about any new moles or changes in moles, especially if there is a change in a mole's shape or color.  Also tell your health care provider if you have a mole that is larger than the size of a pencil eraser.  Always use sunscreen. Apply sunscreen liberally and repeatedly throughout the day.  Protect yourself by wearing long sleeves, pants, a wide-brimmed hat, and sunglasses whenever you are  outside. HEART DISEASE, DIABETES, AND HIGH BLOOD PRESSURE   High blood pressure causes heart disease and increases the risk of stroke. High blood pressure is more likely to develop in:  People who have blood pressure in the high end of the normal range (130-139/85-89 mm Hg).  People who are overweight or obese.  People who are African American.  If you are 22-43 years of age, have your blood pressure checked every 3-5 years. If you are 52 years of age or older, have your blood pressure checked every year. You should have your blood pressure measured twice--once when you are at a hospital or clinic, and once when you are not at a hospital or clinic. Record the average of the two measurements. To check your blood pressure when you are not at a hospital or clinic, you can use:  An automated blood pressure machine at a pharmacy.  A home blood pressure monitor.  If you are between 63 years and 68 years old, ask your health care provider if you should take  aspirin to prevent strokes.  Have regular diabetes screenings. This involves taking a blood sample to check your fasting blood sugar level.  If you are at a normal weight and have a low risk for diabetes, have this test once every three years after 80 years of age.  If you are overweight and have a high risk for diabetes, consider being tested at a younger age or more often. PREVENTING INFECTION  Hepatitis B  If you have a higher risk for hepatitis B, you should be screened for this virus. You are considered at high risk for hepatitis B if:  You were born in a country where hepatitis B is common. Ask your health care provider which countries are considered high risk.  Your parents were born in a high-risk country, and you have not been immunized against hepatitis B (hepatitis B vaccine).  You have HIV or AIDS.  You use needles to inject street drugs.  You live with someone who has hepatitis B.  You have had sex with someone who has hepatitis B.  You get hemodialysis treatment.  You take certain medicines for conditions, including cancer, organ transplantation, and autoimmune conditions. Hepatitis C  Blood testing is recommended for:  Everyone born from [birth year] through 1963.  Anyone with known risk factors for hepatitis C. Sexually transmitted infections (STIs)  You should be screened for sexually transmitted infections (STIs) including gonorrhea and chlamydia if:  You are sexually active and are younger than 80 years of age.  You are older than 81 years of age and your health care provider tells you that you are at risk for this type of infection.  Your sexual activity has changed since you were last screened and you are at an increased risk for chlamydia or gonorrhea. Ask your health care provider if you are at risk.  If you do not have HIV, but are at risk, it may be recommended that you take a prescription medicine daily to prevent HIV infection. This is called  pre-exposure prophylaxis (PrEP). You are considered at risk if:  You are sexually active and do not regularly use condoms or know the HIV status of your partner(s).  You take drugs by injection.  You are sexually active with a partner who has HIV. Talk with your health care provider about whether you are at high risk of being infected with HIV. If you choose to begin PrEP, you should first be tested for HIV.  You should then be tested every 3 months for as long as you are taking PrEP.  PREGNANCY   If you are premenopausal and you may become pregnant, ask your health care provider about preconception counseling.  If you may become pregnant, take 400 to 800 micrograms (mcg) of folic acid every day.  If you want to prevent pregnancy, talk to your health care provider about birth control (contraception). OSTEOPOROSIS AND MENOPAUSE   Osteoporosis is a disease in which the bones lose minerals and strength with aging. This can result in serious bone fractures. Your risk for osteoporosis can be identified using a bone density scan.  If you are 37 years of age or older, or if you are at risk for osteoporosis and fractures, ask your health care provider if you should be screened.  Ask your health care provider whether you should take a calcium or vitamin D supplement to lower your risk for osteoporosis.  Menopause may have certain physical symptoms and risks.  Hormone replacement therapy may reduce some of these symptoms and risks. Talk to your health care provider about whether hormone replacement therapy is right for you.  HOME CARE INSTRUCTIONS   Schedule regular health, dental, and eye exams.  Stay current with your immunizations.   Do not use any tobacco products including cigarettes, chewing tobacco, or electronic cigarettes.  If you are pregnant, do not drink alcohol.  If you are breastfeeding, limit how much and how often you drink alcohol.  Limit alcohol intake to no more than 1  drink per day for nonpregnant women. One drink equals 12 ounces of beer, 5 ounces of wine, or 1 ounces of hard liquor.  Do not use street drugs.  Do not share needles.  Ask your health care provider for help if you need support or information about quitting drugs.  Tell your health care provider if you often feel depressed.  Tell your health care provider if you have ever been abused or do not feel safe at home.   This information is not intended to replace advice given to you by your health care provider. Make sure you discuss any questions you have with your health care provider.   Document Released: 01/19/2011 Document Revised: 07/27/2014 Document Reviewed: 06/07/2013 Elsevier Interactive Patient Education [employer].

## 2015-08-08 NOTE — Assessment & Plan Note (Signed)
Stable on sertraline 50mg daily. Continue. 

## 2015-08-08 NOTE — Progress Notes (Signed)
BP 124/84 mmHg  Pulse 80  Temp(Src) 97.4 F (36.3 C) (Oral)  Wt 122 lb 8 oz (55.566 kg)   CC: Medicare wellness visit Subjective:    Patient ID: [name], female    DOB: 12-Dec-1925, 82 y.o.   MRN: [id number]  HPI: [name] is a 82 y.o. female presenting on 08/08/2015 for Annual Exam   No planned trip to [place]  Hearing screen stable. Vision screen - sees eye doctor regularly No falls in last year PHQ2 = 0  Preventative: Last colonoscopy was 3 yrs ago (2011), rec no need to repeat. Father did die of colon cancer. GI - in [place].  Mammogram last 05/2015 WNL, schedules herself.  Well woman with Dr. Glo Herring in [place] - 01/2015 stable eval. On premaring 2x/wk prn DEXA Date: 04/2013 T score 3.9 AP spine, 2.6 hip. On boniva since 2013. Last boniva injection 05/2015. Prior on oral bisphosphonates - stopped due to h/o chronic gastritis.  Flu shot - 04/2015 Tdap 11/2013 Pneumovax - 200. prevnar 2015 Shingles shot - around 2011 Advanced directive on file (04/2013): Desires no prolonged life sustaining measures if terminally ill. Husband is HCPOA.  Seat belt use discussed Sunscreen use discussed. No changing moles on skin. Sees derm regularly.   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 Interim medical history since our last visit reviewed. Allergies and medications reviewed and updated. Current Outpatient Prescriptions on File Prior to Visit  Medication Sig  . acetaminophen (TYLENOL) 500 MG tablet Take 1 tablet (500 mg total) by mouth 3 (three) times daily as needed.  . brimonidine (ALPHAGAN P) 0.1 % SOLN Place 1 drop into both eyes 2 (two) times daily.   . Calcium-Magnesium-Vitamin D  [id number] MG-MG-UNIT TABS Take 1 tablet by mouth 2 (two) times daily.  . clonazePAM (KLONOPIN) 0.5 MG tablet Take 1 tablet (0.5 mg total) by mouth at bedtime as needed.  . felodipine (PLENDIL) 5 MG 24 hr tablet Take 1 tablet (5 mg total) by mouth daily.  Marland Kitchen glucose blood test strip Use as instructed--FREESTYLE LITE  . ibandronate (BONIVA) 3 MG/3ML SOLN injection Inject 3 mg into the vein every 3 (three) months.   . latanoprost (XALATAN) 0.005 % ophthalmic solution Place 1 drop into both eyes at bedtime.   Marland Kitchen levothyroxine (SYNTHROID, LEVOTHROID) 50 MCG tablet TAKE 1 TABLET DAILY  . Magnesium Oxide 500 MG (LAX) TABS Take 1 tablet by mouth 3 (three) times a week.  . Multiple Vitamin (MULTIVITAMIN) capsule Take 1 capsule by mouth every morning.   . Multiple Vitamins-Minerals (ICAPS AREDS FORMULA PO) Take by mouth. Once daily  . nystatin (MYCOSTATIN) 100000 UNIT/ML suspension Take 5 mLs (500,000 Units total) by mouth 3 (three) times daily.  . Omega-3 Fatty Acids (FISH OIL CONCENTRATE) 1000 MG CAPS Take 2 capsules by mouth 2 (two) times daily.    . sertraline (ZOLOFT) 50 MG tablet TAKE 1 TABLET (50 MG TOTAL) BY MOUTH DAILY. **MUST HAVE PHYSICAL FOR FURTHER REFILLS**  . warfarin (COUMADIN) 2.5 MG tablet Take 2 tablets daily except 1 tablet on Sundays, Tuesdays and Thursdays  . [DISCONTINUED] Calcium Carbonate-Vitamin D (CALCIUM PLUS VITAMIN D PO) Take 2 tablets by mouth daily.     No current facility-administered medications on file prior to visit.    Review  of Systems Per HPI unless specifically indicated in ROS section     Objective:    BP 124/84 mmHg  Pulse 80  Temp(Src) 97.4 F (36.3 C) (Oral)  Wt 122 lb 8 oz (55.566 kg)  Wt Readings from Last 3 Encounters:  08/08/15 122 lb 8 oz (55.566 kg)  07/04/15 123 lb 8 oz (56.019 kg)  06/21/15 118 lb 8 oz (53.751 kg)   Body mass index is 19.78 kg/(m^2).  Physical Exam  Constitutional: She is oriented to person, place, and time. She appears  well-developed and well-nourished. No distress.  HENT:  Head: Normocephalic and atraumatic.  Right Ear: Hearing, tympanic membrane, external ear and ear canal normal.  Left Ear: Hearing, tympanic membrane, external ear and ear canal normal.  Nose: Nose normal.  Mouth/Throat: Uvula is midline, oropharynx is clear and moist and mucous membranes are normal. No oropharyngeal exudate, posterior oropharyngeal edema or posterior oropharyngeal erythema.  Small ridge anterior mid tongue with slight white coating  Eyes: Conjunctivae and EOM are normal. Pupils are equal, round, and reactive to light. No scleral icterus.  Neck: Normal range of motion. Neck supple. Carotid bruit is not present. No thyromegaly present.  Cardiovascular: Normal rate, normal heart sounds and intact distal pulses.  An irregular rhythm present.  No murmur heard. Pulses:      Radial pulses are 2+ on the right side, and 2+ on the left side.  Pulmonary/Chest: Effort normal and breath sounds normal. No respiratory distress. She has no wheezes. She has no rales.  Abdominal: Soft. Bowel sounds are normal. She exhibits no distension and no mass. There is no tenderness. There is no rebound and no guarding.  Musculoskeletal: Normal range of motion. She exhibits no edema.  Lymphadenopathy:    She has no cervical adenopathy.  Neurological: She is alert and oriented to person, place, and time.  CN grossly intact, station and gait intact Recall 3/3 Calculation 4/5 serial 3s  Skin: Skin is warm and dry. No rash noted.  Psychiatric: She has a normal mood and affect. Her behavior is normal. Judgment and thought content normal.  Nursing note and vitals reviewed.  Results for orders placed or performed in visit on 08/07/15  POCT INR  Result Value Ref Range   INR 2.1    Lab Results  Component Value Date   VITAMINB12 450 04/26/2014       Assessment & Plan:   Problem List Items Addressed This Visit    RESOLVED: WEIGHT LOSS, ABNORMAL     Weight gain noted.       Soreness of tongue    Finish nystatin swish swallow. Continue listerine. ?vit deficiency related. Check vitamins next visit      Persistent atrial fibrillation (HCC)    Chronic, stable. Continue coumadin.       Osteoporosis    Severe. Continue IV bisphosphonate. Started 2013. Prior on oral bisphosphonate. Continue cal/vit D BID, continue weight bearing exercise.      Medicare annual wellness visit, subsequent - Primary    I have personally reviewed the Medicare Annual Wellness questionnaire and have noted 1. The patient's medical and social history 2. Their use of alcohol, tobacco or illicit drugs 3. Their current medications and supplements 4. The patient's functional ability including ADL's, fall risks, home safety risks and hearing or visual impairment. Cognitive function has been assessed and addressed as indicated.  5. Diet and physical activity 6. Evidence for depression or mood disorders The patients weight, height, BMI have been recorded  in the chart. I have made referrals, counseling and provided education to the patient based on review of the above and I have provided the pt with a written personalized care plan for preventive services. Provider list updated.. See scanned questionairre as needed for further documentation. Reviewed preventative protocols and updated unless pt declined.       Macrocytosis    Macrocytosis with monocytosis. ?vit deficiency related. Check vitamins next visit, as well as periph smear.       Hypothyroidism    Chronic, stable. Continue current levothyroxine dose      Hyperlipidemia    Chronic, improved off med. Only taking fish oil 2 capsules BID. Has been more compliant with diabetic diet.      GERD (gastroesophageal reflux disease)    Chronic, stable off PPI. Continue to monitor.      Relevant Medications   polyethylene glycol (MIRALAX / GLYCOLAX) packet   Essential hypertension, benign    Chronic,  stable. Continue pendil 5mg  daily.      Diet-controlled diabetes mellitus (HCC)    Chronic, stable. Diet controlled diabetes. Improved control with stricter adherence to diabetic diet      Depression with anxiety    Stable on sertraline 50mg  daily. Continue.      Advanced care planning/counseling discussion    Advanced directive on file (04/2013): Desires no prolonged life sustaining measures if terminally ill. Husband Kerry Dory is HCPOA.           Follow up plan: Return in about 6 months (around 02/05/2016), or as needed, for follow up visit.

## 2015-08-08 NOTE — Assessment & Plan Note (Addendum)
Chronic, stable. Diet controlled diabetes. Improved control with stricter adherence to diabetic diet

## 2015-08-08 NOTE — Assessment & Plan Note (Signed)
Chronic, stable off PPI. Continue to monitor.

## 2015-08-08 NOTE — Assessment & Plan Note (Addendum)
Severe. Continue IV bisphosphonate. Started 2013. Prior on oral bisphosphonate. Continue cal/vit D BID, continue weight bearing exercise.

## 2015-08-08 NOTE — Assessment & Plan Note (Signed)

## 2015-08-08 NOTE — Assessment & Plan Note (Signed)
Weight gain noted.

## 2015-08-08 NOTE — Assessment & Plan Note (Signed)
Advanced directive on file (04/2013): Desires no prolonged life sustaining measures if terminally ill. Husband Kerry Dory is HCPOA.

## 2015-08-08 NOTE — Assessment & Plan Note (Signed)
Chronic, stable. Continue coumadin 

## 2015-08-08 NOTE — Assessment & Plan Note (Signed)
Finish nystatin swish swallow. Continue listerine. ?vit deficiency related. Check vitamins next visit

## 2015-08-16 ENCOUNTER — Telehealth: Payer: Self-pay | Admitting: Family Medicine

## 2015-08-16 DIAGNOSIS — H9190 Unspecified hearing loss, unspecified ear: Secondary | ICD-10-CM

## 2015-08-16 NOTE — Telephone Encounter (Signed)
Pt called asking about audiology referral. I do not see where referral was placed. Please advise.  Pt wants [facility]/afternoon appt  [phone]

## 2015-08-16 NOTE — Telephone Encounter (Signed)
Referral placed.

## 2015-08-19 NOTE — Telephone Encounter (Signed)
[facility] 09/02/15 @ 230p Dr. Janace Hoard  pt aware -arc

## 2015-08-29 ENCOUNTER — Telehealth: Payer: Self-pay | Admitting: *Deleted

## 2015-08-29 NOTE — Telephone Encounter (Signed)
Pt has been put on Amoxicillin 500 mg every 8 hrs

## 2015-08-29 NOTE — Telephone Encounter (Signed)
Pt started amoxicillin 3 x days x 7 days yesterday.  Told pt to continue current dose of coumadin while on Amoxicillin.  Told pt to eat extra greens/salads while on Abx.  Will finish 09/05/15 am.  She has INR appt on 2/15.  She verbalized understanding.

## 2015-09-04 ENCOUNTER — Ambulatory Visit (INDEPENDENT_AMBULATORY_CARE_PROVIDER_SITE_OTHER): Payer: Medicare Other | Admitting: *Deleted

## 2015-09-04 DIAGNOSIS — I4819 Other persistent atrial fibrillation: Secondary | ICD-10-CM

## 2015-09-04 DIAGNOSIS — Z7901 Long term (current) use of anticoagulants: Secondary | ICD-10-CM

## 2015-09-04 DIAGNOSIS — I481 Persistent atrial fibrillation: Secondary | ICD-10-CM

## 2015-09-04 DIAGNOSIS — I4891 Unspecified atrial fibrillation: Secondary | ICD-10-CM

## 2015-09-04 DIAGNOSIS — G459 Transient cerebral ischemic attack, unspecified: Secondary | ICD-10-CM

## 2015-09-04 DIAGNOSIS — Z5181 Encounter for therapeutic drug level monitoring: Secondary | ICD-10-CM

## 2015-09-04 DIAGNOSIS — H903 Sensorineural hearing loss, bilateral: Secondary | ICD-10-CM | POA: Diagnosis not present

## 2015-09-04 LAB — POCT INR: INR: 1.9

## 2015-09-17 DIAGNOSIS — H04123 Dry eye syndrome of bilateral lacrimal glands: Secondary | ICD-10-CM | POA: Diagnosis not present

## 2015-09-17 DIAGNOSIS — H2589 Other age-related cataract: Secondary | ICD-10-CM | POA: Diagnosis not present

## 2015-09-17 DIAGNOSIS — H2512 Age-related nuclear cataract, left eye: Secondary | ICD-10-CM | POA: Diagnosis not present

## 2015-09-17 DIAGNOSIS — Z961 Presence of intraocular lens: Secondary | ICD-10-CM | POA: Diagnosis not present

## 2015-09-17 DIAGNOSIS — H10413 Chronic giant papillary conjunctivitis, bilateral: Secondary | ICD-10-CM | POA: Diagnosis not present

## 2015-09-17 DIAGNOSIS — H40013 Open angle with borderline findings, low risk, bilateral: Secondary | ICD-10-CM | POA: Diagnosis not present

## 2015-09-18 ENCOUNTER — Telehealth: Payer: Self-pay

## 2015-09-18 NOTE — Telephone Encounter (Signed)
Pt left v/m; pt is scheduled for oral surgery and pt wants to postpone Boniva IV infusion for one month. Pt wants Dr [name] input. Pt request cb. Last annual exam 08/08/15.

## 2015-09-19 NOTE — Telephone Encounter (Signed)
Patient notified and verbalized understanding. 

## 2015-09-19 NOTE — Telephone Encounter (Signed)
I agree - postpone next boniva infusion for 3-6 months after dental procedure to allow adequate time for healing.

## 2015-10-02 ENCOUNTER — Ambulatory Visit (INDEPENDENT_AMBULATORY_CARE_PROVIDER_SITE_OTHER): Payer: Medicare Other | Admitting: *Deleted

## 2015-10-02 DIAGNOSIS — I481 Persistent atrial fibrillation: Secondary | ICD-10-CM

## 2015-10-02 DIAGNOSIS — Z7901 Long term (current) use of anticoagulants: Secondary | ICD-10-CM | POA: Diagnosis not present

## 2015-10-02 DIAGNOSIS — I4819 Other persistent atrial fibrillation: Secondary | ICD-10-CM

## 2015-10-02 DIAGNOSIS — I4891 Unspecified atrial fibrillation: Secondary | ICD-10-CM | POA: Diagnosis not present

## 2015-10-02 DIAGNOSIS — Z5181 Encounter for therapeutic drug level monitoring: Secondary | ICD-10-CM

## 2015-10-02 DIAGNOSIS — G459 Transient cerebral ischemic attack, unspecified: Secondary | ICD-10-CM

## 2015-10-02 LAB — POCT INR: INR: 1.9

## 2015-10-14 ENCOUNTER — Telehealth: Payer: Self-pay | Admitting: Family Medicine

## 2015-10-14 NOTE — Telephone Encounter (Signed)
TH scheduled appt with Dr Deborra Medina on 10/15/15 at 10:30.

## 2015-10-14 NOTE — Telephone Encounter (Signed)
PLEASE NOTE: All timestamps contained within this report are represented as [place] Standard Time. CONFIDENTIALTY NOTICE: This fax transmission is intended only for the addressee. It contains information that is legally privileged, confidential or otherwise protected from use or disclosure. If you are not the intended recipient, you are strictly prohibited from reviewing, disclosing, copying using or disseminating any of this information or taking any action in reliance on or regarding this information. If you have received this fax in error, please notify us immediately by telephone so that we can arrange for its return to [place]. Phone: [phone], Toll-Free: [phone], Fax: [phone] Page: 1 of 1 Call Id: [id number] [facility] Patient Name: [name] DOB: [birth year] Initial Comment Caller states she has cough and fever. Nurse Assessment Nurse: Marcelline Deist, RN, Lynda Date/Time (Eastern Time): 10/14/2015 9:00:04 AM Confirm and document reason for call. If symptomatic, describe symptoms. You must click the next button to save text entered. ---Caller states she has cough and fever. Has had cough for a couple weeks, worse over the weekend, waking up with sweats. Fever is not high. Was on PCN for oral surgery recently. Had rheumatic fever as a child. The cough is now productive, white mucus. Has the patient traveled out of the country within the last 30 days? ---Not Applicable Does the patient have any new or worsening symptoms? ---Yes Will a triage be completed? ---Yes Related visit to physician within the last 2 weeks? ---No Does the PT have any chronic conditions? (i.e. diabetes, asthma, etc.) ---Yes List chronic conditions. ---on blood thinner, thyroid rx, on BP rx. Is this a behavioral health or substance abuse call? ---No Guidelines Guideline Title Affirmed Question Affirmed Notes Cough  - Acute Productive Fever present > 3 days (72 hours) Final Disposition User See Physician within [facility], RN, Kermit Balo Comments No availability with patient's provider today or tomorrow, or with Dr. Damita Dunnings who she has seen previously. Scheduled with Dr. Deborra Medina tomorrow. Caller states she has A fib, aortic insufficiency, prolapse of the valve. Had mild chest pain the other day & took her nitroglycerine. Fever > 3 days. Referrals REFERRED TO PCP OFFICE Disagree/Comply: Comply

## 2015-10-15 ENCOUNTER — Ambulatory Visit (INDEPENDENT_AMBULATORY_CARE_PROVIDER_SITE_OTHER): Payer: Medicare Other | Admitting: Family Medicine

## 2015-10-15 ENCOUNTER — Encounter: Payer: Self-pay | Admitting: Family Medicine

## 2015-10-15 VITALS — BP 120/74 | HR 93 | Temp 97.9°F | Wt 124.2 lb

## 2015-10-15 DIAGNOSIS — J069 Acute upper respiratory infection, unspecified: Secondary | ICD-10-CM | POA: Diagnosis not present

## 2015-10-15 MED ORDER — DOXYCYCLINE HYCLATE 100 MG PO TABS: 100.0000 mg | ORAL_TABLET | Freq: Two times a day (BID) | ORAL | Status: DC

## 2015-10-15 NOTE — Patient Instructions (Signed)
Great to see you. We are treating you with you doxycyline 100 mg twice daily x 10 days. Please let your coumadin nurse know that I want you to recheck your INR in 10 days.

## 2015-10-15 NOTE — Assessment & Plan Note (Signed)
Progressive with history and physical concerning for bronchitis/PNA. Avoid zpack due to cardiac issues. Avoid PCN as recent use- spiked temp while taking it. Doxycyline 100 mg twice daily x 10 days. Advised recheck coumadin in 10 days and discussed bleeding precautions. Call or return to clinic prn if these symptoms worsen or fail to improve as anticipated. The patient indicates understanding of these issues and agrees with the plan.

## 2015-10-15 NOTE — Progress Notes (Signed)
Pre visit review using our clinic review tool, if applicable. No additional management support is needed unless otherwise documented below in the visit note. 

## 2015-10-15 NOTE — Progress Notes (Signed)
Subjective:   Patient ID: [name], female    DOB: 1925/12/01, 82 y.o.   MRN: [id number]  [name] is a pleasant 82 y.o. year old female pt of Dr. Darnell Level, who presents to clinic today with Cough and Fever  on 10/15/2015  HPI:  Two weeks of worsening cough.  Started with nasal congestion and tickle in back of her throat and has progressed to productive cough, increased weakness and fever.  Tmax last night was 102. She is taking some OTC cough suppressant.  Just finished prolonged course of PCN for dental work.  Takes coumadin.  Due for INR tomorrow.  Lab Results  Component Value Date   INR 1.9 10/02/2015   INR 1.9 09/04/2015   INR 2.1 08/07/2015   Current Outpatient Prescriptions on File Prior to Visit  Medication Sig Dispense Refill  . acetaminophen (TYLENOL) 500 MG tablet Take 1 tablet (500 mg total) by mouth 3 (three) times daily as needed.    . Calcium-Magnesium-Vitamin D [id number] MG-MG-UNIT TABS Take 1 tablet by mouth 2 (two) times daily.    . clonazePAM (KLONOPIN) 0.5 MG tablet Take 1 tablet (0.5 mg total) by mouth at bedtime as needed. 90 tablet 0  . felodipine (PLENDIL) 5 MG 24 hr tablet Take 1 tablet (5 mg total) by mouth daily. 90 tablet 4  . glucose blood test strip Use as instructed--FREESTYLE LITE 100 each 3  . ibandronate (BONIVA) 3 MG/3ML SOLN injection Inject 3 mg into the vein every 3 (three) months.  3.6 mL   . Lancets (FREESTYLE) lancets E11.9 Use as instructed 50 each 3  . latanoprost (XALATAN) 0.005 % ophthalmic solution Place 1 drop into both eyes at bedtime.     Marland Kitchen levothyroxine (SYNTHROID, LEVOTHROID) 50 MCG tablet TAKE 1 TABLET DAILY 90 tablet 3  . Magnesium Oxide 500 MG (LAX) TABS Take 1 tablet by mouth 3 (three) times a week.    . Multiple Vitamin (MULTIVITAMIN) capsule Take 1 capsule by mouth every morning.     . Multiple Vitamins-Minerals (ICAPS AREDS FORMULA PO) Take by mouth. Once daily    . nystatin (MYCOSTATIN) 100000 UNIT/ML  suspension Take 5 mLs (500,000 Units total) by mouth 3 (three) times daily. 180 mL 0  . Omega-3 Fatty Acids (FISH OIL CONCENTRATE) 1000 MG CAPS Take 2 capsules by mouth 2 (two) times daily.      . polyethylene glycol (MIRALAX / GLYCOLAX) packet Take 17 g by mouth daily. As needed for constipation 30 each 3  . sertraline (ZOLOFT) 50 MG tablet TAKE 1 TABLET (50 MG TOTAL) BY MOUTH DAILY. **MUST HAVE PHYSICAL FOR FURTHER REFILLS** 90 tablet 1  . vitamin B-12 (CYANOCOBALAMIN) 500 MCG tablet Take 500 mcg by mouth daily.    Marland Kitchen warfarin (COUMADIN) 2.5 MG tablet Take 2 tablets daily except 1 tablet on Sundays, Tuesdays and Thursdays 180 tablet 3  . [DISCONTINUED] Calcium Carbonate-Vitamin D (CALCIUM PLUS VITAMIN D PO) Take 2 tablets by mouth daily.       No current facility-administered medications on file prior to visit.    Allergies  Allergen Reactions  . Propoxyphene N-Acetaminophen Shortness Of Breath  . Codeine Nausea And Vomiting  . Meperidine Hcl Nausea And Vomiting  . Morphine Nausea And Vomiting  . Shellfish Allergy Hives    Past Medical History  Diagnosis Date  . History of TIA (transient ischemic attack)   . Hypothyroidism   . History of rheumatic fever   . Osteoporosis 04/2013  Thoracic compression fracture, on bisphosphonate and cal/vit D  . Atrial fibrillation (HCC)     Paroxysmal on coumadin  . Mixed hyperlipidemia   . Essential hypertension, benign   . Diabetes type 2, controlled ([place])     Diet controlled  . Pulmonary nodules     Stable bilateral on CT 01/2010, rec rpt yearly for 2 yrs, pt decided to stop f/u  . Aortic stenosis   . Anemia   . Arthritis   . History of skin cancer   . Anxiety   . Diverticulosis   . GERD (gastroesophageal reflux disease)   . Seasonal allergies   . History of pelvic fracture April 2013  . COPD (chronic obstructive pulmonary disease) ([place])   . Chronic gastritis 10/2009    Duodenitis on EGD  . Hiatal hernia 2011    Small  . Collagen  vascular disease ([place])   . Glaucoma     Dr. Venetia Maxon  . Dry ARMD     Retinal hemorrhage (09/2013) [place]  . CKD (chronic kidney disease) stage 2, GFR 60-89 ml/min   . Macular degeneration   . Abnormal drug screen 06/2015    klonopin negative. ?regular use    Past Surgical History  Procedure Laterality Date  . Tonsillectomy    . Cesarean section      (and h/o 6 miscarriages)  . Breast lumpectomy  1983    LEFT, benign  . Patella fracture surgery  05/2006    LEFT surgically repaired with pins and wire  . Dexa  05/2010    T score -4.5 spine, -2.4 femur; does not want to repeat  . Cataract extraction  2012    RIGHT  . Carotid US  2011    mild plaque formation  . Colonoscopy  03/2010    int hemorrhoids, diverticulosis, no need to repeat (Dr. Sydell Axon)  . Esophagogastroduodenoscopy  10/2009    small HH, multiple antric ulcerations s/p biopsy, mild chronic gastritis/duodenitis  . Esophagogastroduodenoscopy  05/26/2012    RMR: small HH, o/w normal.   . Dilation and curettage of uterus      x2  . Laparoscopic cholecystectomy  02/2013    Dr. Geroge Baseman  . Cholecystectomy N/A 02/27/2013    Procedure: LAPAROSCOPIC CHOLECYSTECTOMY;  Surgeon: Donato Heinz, MD;  Location: [facility];  Service: General;  Laterality: N/A;  . Dexa  04/2013    T score 3.9 [facility] spine, 2.6 hip    Family History  Problem Relation Age of Onset  . Coronary artery disease Mother 33    MI deceased  . Colon cancer Father 66  . Arrhythmia Sister   . Stroke Paternal Grandmother   . Diabetes Neg Hx     Social History   Social History  . Marital Status: Married    Spouse Name: N/A  . Number of Children: N/A  . Years of Education: N/A   Occupational History  . Retired     Former Therapist, sports   Social History Main Topics  . Smoking status: Former Smoker -- 1.00 packs/day for 20 years    Types: Cigarettes    Quit date: 10/13/1998  . Smokeless tobacco: Never Used  . Alcohol Use: No  . Drug Use: No  . Sexual Activity: No    Other Topics Concern  . 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   The PMH, PSH, Social History, Family History, Medications, and allergies have been reviewed in [facility], and have been updated if relevant.   Review of Systems  Constitutional: Positive for fever and fatigue. Negative for unexpected weight change.  HENT: Positive for congestion, postnasal drip, rhinorrhea and sore throat. Negative for sinus pressure, sneezing, tinnitus, trouble swallowing and voice change.   Respiratory: Positive for cough, shortness of breath and wheezing.   Cardiovascular: Negative.   Gastrointestinal: Negative.   Genitourinary: Negative.   Musculoskeletal: Negative.   Skin: Negative.   Allergic/Immunologic: Negative.   Neurological: Negative.   Hematological: Negative.   All other systems reviewed and are negative.      Objective:    BP 120/74 mmHg  Pulse 93  Temp(Src) 97.9 F (36.6 C) (Oral)  Wt 124 lb 4 oz (56.359 kg)  SpO2 92%   Physical Exam  Constitutional: She is oriented to person, place, and time. She appears well-developed and well-nourished. No distress.  HENT:  Head: Normocephalic.  Right Ear: Hearing and tympanic membrane normal.  Left Ear: Hearing and tympanic membrane normal.  Nose: Mucosal edema present. Right sinus exhibits no maxillary sinus tenderness and no frontal sinus tenderness. Left sinus exhibits no maxillary sinus tenderness and no frontal sinus tenderness.  Mouth/Throat: Posterior oropharyngeal erythema present. No posterior oropharyngeal edema.  Poor dentition  Cardiovascular: An irregularly irregular rhythm present.  Pulmonary/Chest: She has no decreased breath sounds. She has wheezes  in the right lower field, the left middle field and the left lower field. She has rhonchi in the right lower field, the left middle field and the left lower field.  Musculoskeletal: Normal range of motion.  Neurological: She is alert and oriented to person, place, and time. No cranial nerve deficit.  Skin: Skin is warm and dry. She is not diaphoretic.  Psychiatric: She has a normal mood and affect. Her behavior is normal. Judgment and thought content normal.  Nursing note and vitals reviewed.         Assessment & Plan:   No diagnosis found. No Follow-up on file.

## 2015-10-16 ENCOUNTER — Ambulatory Visit (INDEPENDENT_AMBULATORY_CARE_PROVIDER_SITE_OTHER): Payer: Medicare Other | Admitting: *Deleted

## 2015-10-16 DIAGNOSIS — Z5181 Encounter for therapeutic drug level monitoring: Secondary | ICD-10-CM

## 2015-10-16 DIAGNOSIS — I4891 Unspecified atrial fibrillation: Secondary | ICD-10-CM

## 2015-10-16 DIAGNOSIS — G459 Transient cerebral ischemic attack, unspecified: Secondary | ICD-10-CM

## 2015-10-16 DIAGNOSIS — I4819 Other persistent atrial fibrillation: Secondary | ICD-10-CM

## 2015-10-16 DIAGNOSIS — Z7901 Long term (current) use of anticoagulants: Secondary | ICD-10-CM

## 2015-10-16 DIAGNOSIS — I481 Persistent atrial fibrillation: Secondary | ICD-10-CM

## 2015-10-16 LAB — POCT INR: INR: 3.2

## 2015-10-17 ENCOUNTER — Encounter (HOSPITAL_COMMUNITY): Payer: Self-pay | Admitting: Emergency Medicine

## 2015-10-17 ENCOUNTER — Inpatient Hospital Stay (HOSPITAL_COMMUNITY)
Admission: EM | Admit: 2015-10-17 | Discharge: 2015-10-20 | DRG: 192 | Disposition: A | Discharge status: Home / Self Care | Payer: Medicare Other | Attending: Internal Medicine | Admitting: Internal Medicine

## 2015-10-17 ENCOUNTER — Emergency Department (HOSPITAL_COMMUNITY): Payer: Medicare Other

## 2015-10-17 DIAGNOSIS — I482 Chronic atrial fibrillation: Secondary | ICD-10-CM | POA: Diagnosis not present

## 2015-10-17 DIAGNOSIS — Z823 Family history of stroke: Secondary | ICD-10-CM

## 2015-10-17 DIAGNOSIS — N182 Chronic kidney disease, stage 2 (mild): Secondary | ICD-10-CM | POA: Diagnosis present

## 2015-10-17 DIAGNOSIS — R0602 Shortness of breath: Secondary | ICD-10-CM | POA: Diagnosis not present

## 2015-10-17 DIAGNOSIS — E039 Hypothyroidism, unspecified: Secondary | ICD-10-CM | POA: Diagnosis present

## 2015-10-17 DIAGNOSIS — J181 Lobar pneumonia, unspecified organism: Secondary | ICD-10-CM | POA: Diagnosis not present

## 2015-10-17 DIAGNOSIS — J984 Other disorders of lung: Secondary | ICD-10-CM | POA: Diagnosis present

## 2015-10-17 DIAGNOSIS — E782 Mixed hyperlipidemia: Secondary | ICD-10-CM | POA: Diagnosis present

## 2015-10-17 DIAGNOSIS — Z7901 Long term (current) use of anticoagulants: Secondary | ICD-10-CM

## 2015-10-17 DIAGNOSIS — E1122 Type 2 diabetes mellitus with diabetic chronic kidney disease: Secondary | ICD-10-CM | POA: Diagnosis not present

## 2015-10-17 DIAGNOSIS — M810 Age-related osteoporosis without current pathological fracture: Secondary | ICD-10-CM | POA: Diagnosis present

## 2015-10-17 DIAGNOSIS — I129 Hypertensive chronic kidney disease with stage 1 through stage 4 chronic kidney disease, or unspecified chronic kidney disease: Secondary | ICD-10-CM | POA: Diagnosis not present

## 2015-10-17 DIAGNOSIS — Z8249 Family history of ischemic heart disease and other diseases of the circulatory system: Secondary | ICD-10-CM

## 2015-10-17 DIAGNOSIS — Z87891 Personal history of nicotine dependence: Secondary | ICD-10-CM

## 2015-10-17 DIAGNOSIS — J069 Acute upper respiratory infection, unspecified: Secondary | ICD-10-CM | POA: Diagnosis not present

## 2015-10-17 DIAGNOSIS — J441 Chronic obstructive pulmonary disease with (acute) exacerbation: Principal | ICD-10-CM | POA: Diagnosis present

## 2015-10-17 DIAGNOSIS — Z66 Do not resuscitate: Secondary | ICD-10-CM | POA: Diagnosis present

## 2015-10-17 DIAGNOSIS — K219 Gastro-esophageal reflux disease without esophagitis: Secondary | ICD-10-CM | POA: Diagnosis not present

## 2015-10-17 DIAGNOSIS — I4819 Other persistent atrial fibrillation: Secondary | ICD-10-CM | POA: Diagnosis present

## 2015-10-17 DIAGNOSIS — Z800 Family history of malignant neoplasm of digestive organs: Secondary | ICD-10-CM

## 2015-10-17 DIAGNOSIS — Z85828 Personal history of other malignant neoplasm of skin: Secondary | ICD-10-CM

## 2015-10-17 DIAGNOSIS — I481 Persistent atrial fibrillation: Secondary | ICD-10-CM | POA: Diagnosis not present

## 2015-10-17 DIAGNOSIS — H409 Unspecified glaucoma: Secondary | ICD-10-CM | POA: Diagnosis present

## 2015-10-17 DIAGNOSIS — E876 Hypokalemia: Secondary | ICD-10-CM | POA: Diagnosis present

## 2015-10-17 DIAGNOSIS — H3530 Unspecified macular degeneration: Secondary | ICD-10-CM | POA: Diagnosis present

## 2015-10-17 DIAGNOSIS — Z8673 Personal history of transient ischemic attack (TIA), and cerebral infarction without residual deficits: Secondary | ICD-10-CM

## 2015-10-17 DIAGNOSIS — R05 Cough: Secondary | ICD-10-CM | POA: Diagnosis not present

## 2015-10-17 LAB — INFLUENZA PANEL BY PCR (TYPE A & B)
H1N1 flu by pcr: NOT DETECTED
INFLAPCR: NEGATIVE
INFLBPCR: NEGATIVE

## 2015-10-17 LAB — CBC WITH DIFFERENTIAL/PLATELET
Basophils Absolute: 0 10*3/uL (ref 0.0–0.1)
Basophils Relative: 1 %
EOS ABS: 0 10*3/uL (ref 0.0–0.7)
EOS PCT: 1 %
HCT: 36.6 % (ref 36.0–46.0)
Hemoglobin: 12.2 g/dL (ref 12.0–15.0)
LYMPHS ABS: 2.5 10*3/uL (ref 0.7–4.0)
Lymphocytes Relative: 32 %
MCH: 32.4 pg (ref 26.0–34.0)
MCHC: 33.3 g/dL (ref 30.0–36.0)
MCV: 97.3 fL (ref 78.0–100.0)
MONOS PCT: 8 %
Monocytes Absolute: 0.6 10*3/uL (ref 0.1–1.0)
Neutro Abs: 4.5 10*3/uL (ref 1.7–7.7)
Neutrophils Relative %: 59 %
PLATELETS: 313 10*3/uL (ref 150–400)
RBC: 3.76 MIL/uL — ABNORMAL LOW (ref 3.87–5.11)
RDW: 13.4 % (ref 11.5–15.5)
WBC: 7.7 10*3/uL (ref 4.0–10.5)

## 2015-10-17 LAB — COMPREHENSIVE METABOLIC PANEL
ALT: 24 U/L (ref 14–54)
AST: 27 U/L (ref 15–41)
Albumin: 3 g/dL — ABNORMAL LOW (ref 3.5–5.0)
Alkaline Phosphatase: 66 U/L (ref 38–126)
Anion gap: 8 (ref 5–15)
BUN: 10 mg/dL (ref 6–20)
CALCIUM: 8.1 mg/dL — AB (ref 8.9–10.3)
CHLORIDE: 106 mmol/L (ref 101–111)
CO2: 27 mmol/L (ref 22–32)
CREATININE: 0.57 mg/dL (ref 0.44–1.00)
Glucose, Bld: 101 mg/dL — ABNORMAL HIGH (ref 65–99)
Potassium: 3.3 mmol/L — ABNORMAL LOW (ref 3.5–5.1)
Sodium: 141 mmol/L (ref 135–145)
TOTAL PROTEIN: 6.8 g/dL (ref 6.5–8.1)
Total Bilirubin: 1 mg/dL (ref 0.3–1.2)

## 2015-10-17 LAB — PROTIME-INR
INR: 2.25 — AB (ref 0.00–1.49)
Prothrombin Time: 24.6 seconds — ABNORMAL HIGH (ref 11.6–15.2)

## 2015-10-17 MED ORDER — LATANOPROST 0.005 % OP SOLN
1.0000 [drp] | Freq: Every day | OPHTHALMIC | Status: DC
  Administered 2015-10-18 – 2015-10-19 (×2): 1 [drp] via OPHTHALMIC
  Filled 2015-10-17: qty 2.5

## 2015-10-17 MED ORDER — IPRATROPIUM-ALBUTEROL 0.5-2.5 (3) MG/3ML IN SOLN
3.0000 mL | Freq: Once | RESPIRATORY_TRACT | Status: AC
  Administered 2015-10-17: 3 mL via RESPIRATORY_TRACT
  Filled 2015-10-17: qty 3

## 2015-10-17 MED ORDER — ADULT MULTIVITAMIN W/MINERALS CH
1.0000 | ORAL_TABLET | Freq: Every day | ORAL | Status: DC
  Administered 2015-10-18 – 2015-10-20 (×3): 1 via ORAL
  Filled 2015-10-17 (×3): qty 1

## 2015-10-17 MED ORDER — CLONAZEPAM 0.5 MG PO TABS
0.5000 mg | ORAL_TABLET | Freq: Every evening | ORAL | Status: DC | PRN
  Administered 2015-10-17 – 2015-10-19 (×2): 0.5 mg via ORAL
  Filled 2015-10-17 (×3): qty 1

## 2015-10-17 MED ORDER — ALBUTEROL SULFATE (2.5 MG/3ML) 0.083% IN NEBU
2.5000 mg | INHALATION_SOLUTION | Freq: Once | RESPIRATORY_TRACT | Status: AC
  Administered 2015-10-17: 2.5 mg via RESPIRATORY_TRACT
  Filled 2015-10-17: qty 3

## 2015-10-17 MED ORDER — LEVALBUTEROL HCL 0.63 MG/3ML IN NEBU
0.6300 mg | INHALATION_SOLUTION | Freq: Four times a day (QID) | RESPIRATORY_TRACT | Status: DC
  Administered 2015-10-18 – 2015-10-19 (×6): 0.63 mg via RESPIRATORY_TRACT
  Filled 2015-10-17 (×6): qty 3

## 2015-10-17 MED ORDER — LEVOTHYROXINE SODIUM 50 MCG PO TABS
50.0000 ug | ORAL_TABLET | Freq: Every day | ORAL | Status: DC
  Administered 2015-10-18 – 2015-10-20 (×3): 50 ug via ORAL
  Filled 2015-10-17 (×3): qty 1

## 2015-10-17 MED ORDER — POLYETHYLENE GLYCOL 3350 17 G PO PACK
17.0000 g | PACK | Freq: Every day | ORAL | Status: DC | PRN
  Administered 2015-10-18 – 2015-10-19 (×2): 17 g via ORAL
  Filled 2015-10-17 (×2): qty 1

## 2015-10-17 MED ORDER — IPRATROPIUM BROMIDE 0.02 % IN SOLN
0.5000 mg | Freq: Four times a day (QID) | RESPIRATORY_TRACT | Status: DC
  Administered 2015-10-18 – 2015-10-19 (×6): 0.5 mg via RESPIRATORY_TRACT
  Filled 2015-10-17 (×6): qty 2.5

## 2015-10-17 MED ORDER — GUAIFENESIN ER 600 MG PO TB12
600.0000 mg | ORAL_TABLET | Freq: Two times a day (BID) | ORAL | Status: DC
  Administered 2015-10-17 – 2015-10-20 (×6): 600 mg via ORAL
  Filled 2015-10-17 (×6): qty 1

## 2015-10-17 MED ORDER — VITAMIN B-12 1000 MCG PO TABS
500.0000 ug | ORAL_TABLET | Freq: Every day | ORAL | Status: DC
  Administered 2015-10-18 – 2015-10-20 (×3): 500 ug via ORAL
  Filled 2015-10-17 (×3): qty 1

## 2015-10-17 MED ORDER — METHYLPREDNISOLONE SODIUM SUCC 125 MG IJ SOLR
60.0000 mg | Freq: Four times a day (QID) | INTRAMUSCULAR | Status: DC
  Administered 2015-10-17 – 2015-10-19 (×7): 60 mg via INTRAVENOUS
  Filled 2015-10-17 (×7): qty 2

## 2015-10-17 MED ORDER — FELODIPINE ER 5 MG PO TB24
5.0000 mg | ORAL_TABLET | Freq: Every day | ORAL | Status: DC
  Administered 2015-10-18 – 2015-10-20 (×3): 5 mg via ORAL
  Filled 2015-10-17 (×5): qty 1

## 2015-10-17 MED ORDER — OMEGA-3-ACID ETHYL ESTERS 1 G PO CAPS
1.0000 g | ORAL_CAPSULE | Freq: Every day | ORAL | Status: DC
  Administered 2015-10-18 – 2015-10-20 (×3): 1 g via ORAL
  Filled 2015-10-17 (×3): qty 1

## 2015-10-17 MED ORDER — SERTRALINE HCL 50 MG PO TABS
50.0000 mg | ORAL_TABLET | Freq: Every day | ORAL | Status: DC
  Administered 2015-10-17 – 2015-10-19 (×3): 50 mg via ORAL
  Filled 2015-10-17 (×3): qty 1

## 2015-10-17 MED ORDER — POTASSIUM CHLORIDE CRYS ER 20 MEQ PO TBCR
40.0000 meq | EXTENDED_RELEASE_TABLET | Freq: Once | ORAL | Status: AC
  Administered 2015-10-17: 40 meq via ORAL
  Filled 2015-10-17: qty 2

## 2015-10-17 MED ORDER — ONDANSETRON HCL 4 MG PO TABS: 4.0000 mg | ORAL_TABLET | Freq: Four times a day (QID) | ORAL | Status: DC | PRN

## 2015-10-17 MED ORDER — SODIUM CHLORIDE 0.9% FLUSH
3.0000 mL | Freq: Two times a day (BID) | INTRAVENOUS | Status: DC
  Administered 2015-10-17 – 2015-10-20 (×5): 3 mL via INTRAVENOUS

## 2015-10-17 MED ORDER — WARFARIN SODIUM 5 MG PO TABS
2.5000 mg | ORAL_TABLET | Freq: Once | ORAL | Status: AC
  Administered 2015-10-17: 2.5 mg via ORAL
  Filled 2015-10-17: qty 1

## 2015-10-17 MED ORDER — ONDANSETRON HCL 4 MG/2ML IJ SOLN: 4.0000 mg | Freq: Four times a day (QID) | INTRAMUSCULAR | Status: DC | PRN

## 2015-10-17 MED ORDER — LEVALBUTEROL HCL 0.63 MG/3ML IN NEBU
0.6300 mg | INHALATION_SOLUTION | Freq: Four times a day (QID) | RESPIRATORY_TRACT | Status: DC
  Administered 2015-10-17: 0.63 mg via RESPIRATORY_TRACT
  Filled 2015-10-17: qty 3

## 2015-10-17 MED ORDER — METHYLPREDNISOLONE SODIUM SUCC 125 MG IJ SOLR
125.0000 mg | Freq: Once | INTRAMUSCULAR | Status: AC
  Administered 2015-10-17: 125 mg via INTRAVENOUS
  Filled 2015-10-17: qty 2

## 2015-10-17 MED ORDER — IPRATROPIUM BROMIDE 0.02 % IN SOLN
0.5000 mg | Freq: Four times a day (QID) | RESPIRATORY_TRACT | Status: DC
  Administered 2015-10-17: 0.5 mg via RESPIRATORY_TRACT
  Filled 2015-10-17: qty 2.5

## 2015-10-17 MED ORDER — DOXYCYCLINE HYCLATE 100 MG PO TABS
100.0000 mg | ORAL_TABLET | Freq: Two times a day (BID) | ORAL | Status: DC
  Administered 2015-10-17 – 2015-10-20 (×6): 100 mg via ORAL
  Filled 2015-10-17 (×6): qty 1

## 2015-10-17 MED ORDER — SODIUM CHLORIDE 0.9 % IV BOLUS (SEPSIS)
1000.0000 mL | Freq: Once | INTRAVENOUS | Status: AC
  Administered 2015-10-17: 1000 mL via INTRAVENOUS

## 2015-10-17 MED ORDER — WARFARIN - PHARMACIST DOSING INPATIENT: Status: DC

## 2015-10-17 MED ORDER — SODIUM CHLORIDE 0.9 % IV SOLN
INTRAVENOUS | Status: DC
  Administered 2015-10-17: 21:00:00 via INTRAVENOUS

## 2015-10-17 NOTE — ED Notes (Addendum)
Pt reports cough x2 weeks. Pt reports seen by PCP and was diagnosed with pneumonia. Pt sent here from urgent care with chest xray disc. nad noted. Pt reports increased dyspnea with exertion. nad noted.

## 2015-10-17 NOTE — Progress Notes (Signed)
ANTICOAGULATION CONSULT NOTE - Initial Consult  Pharmacy Consult for Warfarin Indication: atrial fibrillation  Allergies  Allergen Reactions  . Propoxyphene N-Acetaminophen Shortness Of Breath  . Codeine Nausea And Vomiting  . Meperidine Hcl Nausea And Vomiting  . Morphine Nausea And Vomiting  . Shellfish Allergy Hives    Patient Measurements: Height: 5\' 6"  (167.6 cm) Weight: 122 lb (55.339 kg) IBW/kg (Calculated) : 59.3  Vital Signs: Temp: 97.5 F (36.4 C) (03/30 1947) Temp Source: Oral (03/30 1947) BP: 144/78 mmHg (03/30 1947) Pulse Rate: 112 (03/30 1947)  Labs:  Recent Labs  10/16/15 0947 10/17/15 1504  HGB  --  12.2  HCT  --  36.6  PLT  --  313  LABPROT  --  24.6*  INR 3.2 2.25*  CREATININE  --  0.57    Estimated Creatinine Clearance: 42.4 mL/min (by C-G formula based on Cr of 0.57).   Medical History: Past Medical History  Diagnosis Date  . History of TIA (transient ischemic attack)   . Hypothyroidism   . History of rheumatic fever   . Osteoporosis 04/2013    Thoracic compression fracture, on bisphosphonate and cal/vit D  . Atrial fibrillation (HCC)     Paroxysmal on coumadin  . Mixed hyperlipidemia   . Essential hypertension, benign   . Diabetes type 2, controlled ([place])     Diet controlled  . Pulmonary nodules     Stable bilateral on CT 01/2010, rec rpt yearly for 2 yrs, pt decided to stop f/u  . Aortic stenosis   . Anemia   . Arthritis   . History of skin cancer   . Anxiety   . Diverticulosis   . GERD (gastroesophageal reflux disease)   . Seasonal allergies   . History of pelvic fracture April 2013  . COPD (chronic obstructive pulmonary disease) ([place])   . Chronic gastritis 10/2009    Duodenitis on EGD  . Hiatal hernia 2011    Small  . Collagen vascular disease ([place])   . Glaucoma     Dr. Venetia Maxon  . Dry ARMD     Retinal hemorrhage (09/2013) [place]  . CKD (chronic kidney disease) stage 2, GFR 60-89 ml/min   . Macular degeneration   .  Abnormal drug screen 06/2015    klonopin negative. ?regular use    Medications:  Prescriptions prior to admission  Medication Sig Dispense Refill Last Dose  . Calcium-Magnesium-Vitamin D [id number] MG-MG-UNIT TABS Take 1 tablet by mouth 2 (two) times daily.   Past Week at Unknown time  . cromolyn (OPTICROM) 4 % ophthalmic solution Place 1 drop into both eyes 4 (four) times daily as needed.  0 10/17/2015 at Unknown time  . doxycycline (VIBRA-TABS) 100 MG tablet Take 1 tablet (100 mg total) by mouth 2 (two) times daily. 20 tablet 0 10/17/2015 at Unknown time  . felodipine (PLENDIL) 5 MG 24 hr tablet Take 1 tablet (5 mg total) by mouth daily. 90 tablet 4 10/17/2015 at Unknown time  . latanoprost (XALATAN) 0.005 % ophthalmic solution Place 1 drop into both eyes at bedtime.    10/16/2015 at Unknown time  . levothyroxine (SYNTHROID, LEVOTHROID) 50 MCG tablet TAKE 1 TABLET DAILY 90 tablet 3 10/17/2015 at Unknown time  . Magnesium Oxide 500 MG (LAX) TABS Take 1 tablet by mouth 3 (three) times a week.   Past Week at Unknown time  . Multiple Vitamin (MULTIVITAMIN) capsule Take 1 capsule by mouth every morning.    10/17/2015 at Unknown time  .  Multiple Vitamins-Minerals (ICAPS AREDS FORMULA PO) Take 1 tablet by mouth daily.    10/17/2015 at Unknown time  . Omega-3 Fatty Acids (FISH OIL CONCENTRATE) 1000 MG CAPS Take 2 capsules by mouth 2 (two) times daily.     10/17/2015 at Unknown time  . sertraline (ZOLOFT) 50 MG tablet TAKE 1 TABLET (50 MG TOTAL) BY MOUTH DAILY. **MUST HAVE PHYSICAL FOR FURTHER REFILLS** (Patient taking differently: Take 50 mg by mouth at bedtime. TAKE 1 TABLET (50 MG TOTAL) BY MOUTH DAILY. **MUST HAVE PHYSICAL FOR FURTHER REFILLS**) 90 tablet 1 10/16/2015 at 2000  . vitamin B-12 (CYANOCOBALAMIN) 500 MCG tablet Take 500 mcg by mouth daily.   Past Week at Unknown time  . warfarin (COUMADIN) 2.5 MG tablet Take 2 tablets daily except 1 tablet on Sundays, Tuesdays and Thursdays (Patient taking  differently: Take 2.5-5 mg by mouth at bedtime. Take 2 tablets daily except 1 tablet on Thursday,Saturday,Sunday,Tuesday for 1 week while on antibiotic) 180 tablet 3 10/16/2015 at 2000  . acetaminophen (TYLENOL) 500 MG tablet Take 1 tablet (500 mg total) by mouth 3 (three) times daily as needed.   unknown  . clonazePAM (KLONOPIN) 0.5 MG tablet Take 1 tablet (0.5 mg total) by mouth at bedtime as needed. (Patient taking differently: Take 0.5 mg by mouth at bedtime as needed for anxiety. ) 90 tablet 0 unknown  . glucose blood test strip Use as instructed--FREESTYLE LITE 100 each 3 Taking  . ibandronate (BONIVA) 3 MG/3ML SOLN injection Inject 3 mg into the vein every 3 (three) months.  3.6 mL  4 months  . Lancets (FREESTYLE) lancets E11.9 Use as instructed 50 each 3 Taking  . nystatin (MYCOSTATIN) 100000 UNIT/ML suspension Take 5 mLs (500,000 Units total) by mouth 3 (three) times daily. (Patient not taking: Reported on 10/17/2015) 180 mL 0 Taking  . polyethylene glycol (MIRALAX / GLYCOLAX) packet Take 17 g by mouth daily. As needed for constipation 30 each 3 unknown    Assessment: Okay for Protocol, INR therapeutic.  Last dose 3/29 per home med list.  Goal of Therapy:  INR 2-3   Plan:  Warfarin 2.5mg  PO x 1 today. Daily PT/INR  Pricilla Larsson 10/17/2015,8:54 PM

## 2015-10-17 NOTE — ED Notes (Signed)
When patient returned from bathroom, her Oxygen Saturation was 81% on room air.

## 2015-10-17 NOTE — Progress Notes (Signed)
Pt ambulated to the bathroom and her SATS dropped to 80%. RT placed Pt on 2L [place]. SATS increased to to 96%

## 2015-10-17 NOTE — ED Provider Notes (Signed)
CSN: [id number]     Arrival date & time 10/17/15  1156 History   First MD Initiated Contact with Patient 10/17/15 1427     Chief Complaint  Patient presents with  . Cough     (Consider location/radiation/quality/duration/timing/severity/associated sxs/prior Treatment) Patient is a 82 y.o. female presenting with cough. The history is provided by the patient (Patient complains of cough shortness of breath for over week. She's also has significant fatigue).  Cough Cough characteristics:  Non-productive Severity:  Moderate Onset quality:  Sudden Progression:  Waxing and waning Chronicity:  New Smoker: no   Context: not animal exposure   Associated symptoms: shortness of breath and wheezing   Associated symptoms: no chest pain, no eye discharge, no headaches and no rash     Past Medical History  Diagnosis Date  . History of TIA (transient ischemic attack)   . Hypothyroidism   . History of rheumatic fever   . Osteoporosis 04/2013    Thoracic compression fracture, on bisphosphonate and cal/vit D  . Atrial fibrillation (HCC)     Paroxysmal on coumadin  . Mixed hyperlipidemia   . Essential hypertension, benign   . Diabetes type 2, controlled ([place])     Diet controlled  . Pulmonary nodules     Stable bilateral on CT 01/2010, rec rpt yearly for 2 yrs, pt decided to stop f/u  . Aortic stenosis   . Anemia   . Arthritis   . History of skin cancer   . Anxiety   . Diverticulosis   . GERD (gastroesophageal reflux disease)   . Seasonal allergies   . History of pelvic fracture April 2013  . COPD (chronic obstructive pulmonary disease) ([place])   . Chronic gastritis 10/2009    Duodenitis on EGD  . Hiatal hernia 2011    Small  . Collagen vascular disease ([place])   . Glaucoma     Dr. Venetia Maxon  . Dry ARMD     Retinal hemorrhage (09/2013) [place]  . CKD (chronic kidney disease) stage 2, GFR 60-89 ml/min   . Macular degeneration   . Abnormal drug screen 06/2015    klonopin negative. ?regular  use   Past Surgical History  Procedure Laterality Date  . Tonsillectomy    . Cesarean section      (and h/o 6 miscarriages)  . Breast lumpectomy  1983    LEFT, benign  . Patella fracture surgery  05/2006    LEFT surgically repaired with pins and wire  . Dexa  05/2010    T score -4.5 spine, -2.4 femur; does not want to repeat  . Cataract extraction  2012    RIGHT  . Carotid US  2011    mild plaque formation  . Colonoscopy  03/2010    int hemorrhoids, diverticulosis, no need to repeat (Dr. Sydell Axon)  . Esophagogastroduodenoscopy  10/2009    small HH, multiple antric ulcerations s/p biopsy, mild chronic gastritis/duodenitis  . Esophagogastroduodenoscopy  05/26/2012    RMR: small HH, o/w normal.   . Dilation and curettage of uterus      x2  . Laparoscopic cholecystectomy  02/2013    Dr. Geroge Baseman  . Cholecystectomy N/A 02/27/2013    Procedure: LAPAROSCOPIC CHOLECYSTECTOMY;  Surgeon: Donato Heinz, MD;  Location: [facility];  Service: General;  Laterality: N/A;  . Dexa  04/2013    T score 3.9 [facility] spine, 2.6 hip   Family History  Problem Relation Age of Onset  . Coronary artery disease Mother 29  MI deceased  . Colon cancer Father 61  . Arrhythmia Sister   . Stroke Paternal Grandmother   . Diabetes Neg Hx    Social History  Substance Use Topics  . Smoking status: Former Smoker -- 1.00 packs/day for 20 years    Types: Cigarettes    Quit date: 10/13/1998  . Smokeless tobacco: Never Used  . Alcohol Use: No   OB History    No data available     Review of Systems  Constitutional: Negative for appetite change and fatigue.  HENT: Negative for congestion, ear discharge and sinus pressure.   Eyes: Negative for discharge.  Respiratory: Positive for cough, shortness of breath and wheezing.   Cardiovascular: Negative for chest pain.  Gastrointestinal: Negative for abdominal pain and diarrhea.  Genitourinary: Negative for frequency and hematuria.  Musculoskeletal: Negative for back  pain.  Skin: Negative for rash.  Neurological: Negative for seizures and headaches.  Psychiatric/Behavioral: Negative for hallucinations.      Allergies  Propoxyphene n-acetaminophen; Codeine; Meperidine hcl; Morphine; and Shellfish allergy  Home Medications   Prior to Admission medications   Medication Sig Start Date End Date Taking? Authorizing Provider  Calcium-Magnesium-Vitamin D [id number] MG-MG-UNIT TABS Take 1 tablet by mouth 2 (two) times daily.   Yes Historical Provider, MD  cromolyn (OPTICROM) 4 % ophthalmic solution Place 1 drop into both eyes 4 (four) times daily as needed. 09/17/15  Yes Historical Provider, MD  doxycycline (VIBRA-TABS) 100 MG tablet Take 1 tablet (100 mg total) by mouth 2 (two) times daily. 10/15/15  Yes Lucille Passy, MD  felodipine (PLENDIL) 5 MG 24 hr tablet Take 1 tablet (5 mg total) by mouth daily. 12/10/14  Yes Lendon Colonel, NP  latanoprost (XALATAN) 0.005 % ophthalmic solution Place 1 drop into both eyes at bedtime.    Yes Historical Provider, MD  levothyroxine (SYNTHROID, LEVOTHROID) 50 MCG tablet TAKE 1 TABLET DAILY 06/24/15  Yes Ria Bush, MD  Magnesium Oxide 500 MG (LAX) TABS Take 1 tablet by mouth 3 (three) times a week.   Yes Historical Provider, MD  Multiple Vitamin (MULTIVITAMIN) capsule Take 1 capsule by mouth every morning.    Yes Historical Provider, MD  Multiple Vitamins-Minerals (ICAPS AREDS FORMULA PO) Take 1 tablet by mouth daily.    Yes Historical Provider, MD  Omega-3 Fatty Acids (FISH OIL CONCENTRATE) 1000 MG CAPS Take 2 capsules by mouth 2 (two) times daily.     Yes Historical Provider, MD  sertraline (ZOLOFT) 50 MG tablet TAKE 1 TABLET (50 MG TOTAL) BY MOUTH DAILY. **MUST HAVE PHYSICAL FOR FURTHER REFILLS** Patient taking differently: Take 50 mg by mouth at bedtime. TAKE 1 TABLET (50 MG TOTAL) BY MOUTH DAILY. **MUST HAVE PHYSICAL FOR FURTHER REFILLS** 06/06/15  Yes Ria Bush, MD  vitamin B-12 (CYANOCOBALAMIN) 500 MCG  tablet Take 500 mcg by mouth daily.   Yes Historical Provider, MD  warfarin (COUMADIN) 2.5 MG tablet Take 2 tablets daily except 1 tablet on Sundays, Tuesdays and Thursdays Patient taking differently: Take 2.5-5 mg by mouth at bedtime. Take 2 tablets daily except 1 tablet on Thursday,Saturday,Sunday,Tuesday for 1 week while on antibiotic 02/06/15  Yes Satira Sark, MD  acetaminophen (TYLENOL) 500 MG tablet Take 1 tablet (500 mg total) by mouth 3 (three) times daily as needed. 07/04/15   Ria Bush, MD  clonazePAM (KLONOPIN) 0.5 MG tablet Take 1 tablet (0.5 mg total) by mouth at bedtime as needed. Patient taking differently: Take 0.5 mg by mouth at bedtime as  needed for anxiety.  06/07/15   Ria Bush, MD  glucose blood test strip Use as instructed--FREESTYLE LITE 05/23/13   Ria Bush, MD  ibandronate (BONIVA) 3 MG/3ML SOLN injection Inject 3 mg into the vein every 3 (three) months.  04/05/12   Ria Bush, MD  Lancets (FREESTYLE) lancets E11.9 Use as instructed 08/08/15   Ria Bush, MD  nystatin (MYCOSTATIN) 100000 UNIT/ML suspension Take 5 mLs (500,000 Units total) by mouth 3 (three) times daily. Patient not taking: Reported on 10/17/2015 08/01/15   Ria Bush, MD  polyethylene glycol [facility] / Floria Raveling) packet Take 17 g by mouth daily. As needed for constipation 08/08/15   Ria Bush, MD   BP 131/70 mmHg  Pulse 99  Temp(Src) 98 F (36.7 C) (Oral)  Resp 18  Ht 5\' 6"  (1.676 m)  Wt 124 lb (56.246 kg)  BMI 20.02 kg/m2  SpO2 95% Physical Exam  Constitutional: She is oriented to person, place, and time. She appears well-developed.  HENT:  Head: Normocephalic.  Eyes: Conjunctivae and EOM are normal. No scleral icterus.  Neck: Neck supple. No thyromegaly present.  Cardiovascular: Normal rate and regular rhythm.  Exam reveals no gallop and no friction rub.   No murmur heard. Pulmonary/Chest: No stridor. She has wheezes. She has no rales. She exhibits  no tenderness.  Abdominal: She exhibits no distension. There is no tenderness. There is no rebound.  Musculoskeletal: Normal range of motion. She exhibits no edema.  Lymphadenopathy:    She has no cervical adenopathy.  Neurological: She is oriented to person, place, and time. She exhibits normal muscle tone. Coordination normal.  Skin: No rash noted. No erythema.  Psychiatric: She has a normal mood and affect. Her behavior is normal.    ED Course  Procedures (including critical care time) Labs Review Labs Reviewed  CBC WITH DIFFERENTIAL/PLATELET - Abnormal; Notable for the following:    RBC 3.76 (*)    All other components within normal limits  COMPREHENSIVE METABOLIC PANEL - Abnormal; Notable for the following:    Potassium 3.3 (*)    Glucose, Bld 101 (*)    Calcium 8.1 (*)    Albumin 3.0 (*)    All other components within normal limits  INFLUENZA PANEL BY PCR (TYPE A & B, H1N1)  PROTIME-INR    Imaging Review Dg Chest 2 View  10/17/2015  CLINICAL DATA:  Shortness of breath and productive cough for 2 weeks. Ex-smoker, COPD, diabetic. EXAM: CHEST  2 VIEW COMPARISON:  Chest x-ray dated 12/07/2013. FINDINGS: Lungs are hyperexpanded compatible with the given history of COPD. Coarse interstitial lung markings again noted bilaterally suggesting chronic interstitial lung disease. No new confluent airspace opacity to suggest a developing pneumonia. Probable atelectasis and/or chronic pleural thickening at each lung base, left greater than right. There are multiple wedge compression deformities within the thoracic spine which appear chronic and predominantly similar in degree when compared to an earlier chest CT of 06/13/2010. Stable levoscoliosis of the thoracic spine. No acute-appearing osseous abnormality. IMPRESSION: 1. Hyperexpanded lungs indicating COPD/emphysema. Suspect associated chronic bronchitic changes centrally and a diffuse bilateral chronic interstitial lung disease. 2. No acute  findings.  No evidence of pneumonia. 3. Additional chronic/incidental findings detailed above. Electronically Signed   By: Franki Cabot M.D.   On: 10/17/2015 15:38   I have personally reviewed and evaluated these images and lab results as part of my medical decision-making.   EKG Interpretation None      MDM   Final diagnoses:  COPD exacerbation ([place])    Patient with COPD exacerbation and viral syndrome. She will be admitted for hypoxia and treatment of COPD    Milton Ferguson, MD 10/17/15 1721

## 2015-10-17 NOTE — H&P (Signed)
PCP:   Ria Bush, MD   Chief Complaint:  Cough  HPI: 82 year old female who   has a past medical history of History of TIA (transient ischemic attack); Hypothyroidism; History of rheumatic fever; Osteoporosis (04/2013); Atrial fibrillation ([place]); Mixed hyperlipidemia; Essential hypertension, benign; Diabetes type 2, controlled ([place]); Pulmonary nodules; Aortic stenosis; Anemia; Arthritis; History of skin cancer; Anxiety; Diverticulosis; GERD (gastroesophageal reflux disease); Seasonal allergies; History of pelvic fracture (April 2013); COPD (chronic obstructive pulmonary disease) ([place]); Chronic gastritis (10/2009); Hiatal hernia (2011); Collagen vascular disease ([place]); Glaucoma; Dry ARMD; CKD (chronic kidney disease) stage 2, GFR 60-89 ml/min; Macular degeneration; and Abnormal drug screen (06/2015). Today presents to the hospital with chief complaint of cough over one week. Also has been complaining of shortness of breath. She has been coughing up white to brown colored phlegm. She was seen by PCP and started on doxycycline. Patient says she did not improve so she came to the hospital. She denies any chest pain, no fever or chills. No nausea vomiting or diarrhea. No dysuria urgency or frequency of urination. In the ED chest x-ray showed finding consistent with chronic bronchitis/COPD but no pneumonia.   Allergies:   Allergies  Allergen Reactions  . Propoxyphene N-Acetaminophen Shortness Of Breath  . Codeine Nausea And Vomiting  . Meperidine Hcl Nausea And Vomiting  . Morphine Nausea And Vomiting  . Shellfish Allergy Hives      Past Medical History  Diagnosis Date  . History of TIA (transient ischemic attack)   . Hypothyroidism   . History of rheumatic fever   . Osteoporosis 04/2013    Thoracic compression fracture, on bisphosphonate and cal/vit D  . Atrial fibrillation (HCC)     Paroxysmal on coumadin  . Mixed hyperlipidemia   . Essential hypertension, benign   . Diabetes  type 2, controlled ([place])     Diet controlled  . Pulmonary nodules     Stable bilateral on CT 01/2010, rec rpt yearly for 2 yrs, pt decided to stop f/u  . Aortic stenosis   . Anemia   . Arthritis   . History of skin cancer   . Anxiety   . Diverticulosis   . GERD (gastroesophageal reflux disease)   . Seasonal allergies   . History of pelvic fracture April 2013  . COPD (chronic obstructive pulmonary disease) ([place])   . Chronic gastritis 10/2009    Duodenitis on EGD  . Hiatal hernia 2011    Small  . Collagen vascular disease ([place])   . Glaucoma     Dr. Venetia Maxon  . Dry ARMD     Retinal hemorrhage (09/2013) [place]  . CKD (chronic kidney disease) stage 2, GFR 60-89 ml/min   . Macular degeneration   . Abnormal drug screen 06/2015    klonopin negative. ?regular use    Past Surgical History  Procedure Laterality Date  . Tonsillectomy    . Cesarean section      (and h/o 6 miscarriages)  . Breast lumpectomy  1983    LEFT, benign  . Patella fracture surgery  05/2006    LEFT surgically repaired with pins and wire  . Dexa  05/2010    T score -4.5 spine, -2.4 femur; does not want to repeat  . Cataract extraction  2012    RIGHT  . Carotid US  2011    mild plaque formation  . Colonoscopy  03/2010    int hemorrhoids, diverticulosis, no need to repeat (Dr. Sydell Axon)  . Esophagogastroduodenoscopy  10/2009  small HH, multiple antric ulcerations s/p biopsy, mild chronic gastritis/duodenitis  . Esophagogastroduodenoscopy  05/26/2012    RMR: small HH, o/w normal.   . Dilation and curettage of uterus      x2  . Laparoscopic cholecystectomy  02/2013    Dr. Geroge Baseman  . Cholecystectomy N/A 02/27/2013    Procedure: LAPAROSCOPIC CHOLECYSTECTOMY;  Surgeon: Donato Heinz, MD;  Location: [facility];  Service: General;  Laterality: N/A;  . Dexa  04/2013    T score 3.9 [facility] spine, 2.6 hip    Prior to Admission medications   Medication Sig Start Date End Date Taking? Authorizing Provider    Calcium-Magnesium-Vitamin D [id number] MG-MG-UNIT TABS Take 1 tablet by mouth 2 (two) times daily.   Yes Historical Provider, MD  cromolyn (OPTICROM) 4 % ophthalmic solution Place 1 drop into both eyes 4 (four) times daily as needed. 09/17/15  Yes Historical Provider, MD  doxycycline (VIBRA-TABS) 100 MG tablet Take 1 tablet (100 mg total) by mouth 2 (two) times daily. 10/15/15  Yes Lucille Passy, MD  felodipine (PLENDIL) 5 MG 24 hr tablet Take 1 tablet (5 mg total) by mouth daily. 12/10/14  Yes Lendon Colonel, NP  latanoprost (XALATAN) 0.005 % ophthalmic solution Place 1 drop into both eyes at bedtime.    Yes Historical Provider, MD  levothyroxine (SYNTHROID, LEVOTHROID) 50 MCG tablet TAKE 1 TABLET DAILY 06/24/15  Yes Ria Bush, MD  Magnesium Oxide 500 MG (LAX) TABS Take 1 tablet by mouth 3 (three) times a week.   Yes Historical Provider, MD  Multiple Vitamin (MULTIVITAMIN) capsule Take 1 capsule by mouth every morning.    Yes Historical Provider, MD  Multiple Vitamins-Minerals (ICAPS AREDS FORMULA PO) Take 1 tablet by mouth daily.    Yes Historical Provider, MD  Omega-3 Fatty Acids (FISH OIL CONCENTRATE) 1000 MG CAPS Take 2 capsules by mouth 2 (two) times daily.     Yes Historical Provider, MD  sertraline (ZOLOFT) 50 MG tablet TAKE 1 TABLET (50 MG TOTAL) BY MOUTH DAILY. **MUST HAVE PHYSICAL FOR FURTHER REFILLS** Patient taking differently: Take 50 mg by mouth at bedtime. TAKE 1 TABLET (50 MG TOTAL) BY MOUTH DAILY. **MUST HAVE PHYSICAL FOR FURTHER REFILLS** 06/06/15  Yes Ria Bush, MD  vitamin B-12 (CYANOCOBALAMIN) 500 MCG tablet Take 500 mcg by mouth daily.   Yes Historical Provider, MD  warfarin (COUMADIN) 2.5 MG tablet Take 2 tablets daily except 1 tablet on Sundays, Tuesdays and Thursdays Patient taking differently: Take 2.5-5 mg by mouth at bedtime. Take 2 tablets daily except 1 tablet on Thursday,Saturday,Sunday,Tuesday for 1 week while on antibiotic 02/06/15  Yes Satira Sark, MD  acetaminophen (TYLENOL) 500 MG tablet Take 1 tablet (500 mg total) by mouth 3 (three) times daily as needed. 07/04/15   Ria Bush, MD  clonazePAM (KLONOPIN) 0.5 MG tablet Take 1 tablet (0.5 mg total) by mouth at bedtime as needed. Patient taking differently: Take 0.5 mg by mouth at bedtime as needed for anxiety.  06/07/15   Ria Bush, MD  glucose blood test strip Use as instructed--FREESTYLE LITE 05/23/13   Ria Bush, MD  ibandronate (BONIVA) 3 MG/3ML SOLN injection Inject 3 mg into the vein every 3 (three) months.  04/05/12   Ria Bush, MD  Lancets (FREESTYLE) lancets E11.9 Use as instructed 08/08/15   Ria Bush, MD  nystatin (MYCOSTATIN) 100000 UNIT/ML suspension Take 5 mLs (500,000 Units total) by mouth 3 (three) times daily. Patient not taking: Reported on 10/17/2015 08/01/15  Ria Bush, MD  polyethylene glycol [facility] / GLYCOLAX) packet Take 17 g by mouth daily. As needed for constipation 08/08/15   Ria Bush, MD    Social History:  reports that she quit smoking about 17 years ago. Her smoking use included Cigarettes. She has a 20 pack-year smoking history. She has never used smokeless tobacco. She reports that she does not drink alcohol or use illicit drugs.  Family History  Problem Relation Age of Onset  . Coronary artery disease Mother 74    MI deceased  . Colon cancer Father 56  . Arrhythmia Sister   . Stroke Paternal Grandmother   . Diabetes Neg Hx     Filed Weights   10/17/15 1201  Weight: 56.246 kg (124 lb)    All the positives are listed in BOLD  Review of Systems:  HEENT: Headache, blurred vision, runny nose, sore throat Neck: Hypothyroidism, hyperthyroidism,,lymphadenopathy Chest : Shortness of breath, history of COPD, Asthma, cough Heart : Chest pain, history of coronary arterey disease GI:  Nausea, vomiting, diarrhea, constipation, GERD GU: Dysuria, urgency, frequency of urination, hematuria Neuro:  Stroke, seizures, syncope Psych: Depression, anxiety, hallucinations   Physical Exam: Blood pressure 131/70, pulse 99, temperature 98 F (36.7 C), temperature source Oral, resp. rate 18, height 5\' 6"  (1.676 m), weight 56.246 kg (124 lb), SpO2 96 %. Constitutional:   Patient is a well-developed and well-nourished *female in no acute distress and cooperative with exam. Head: Normocephalic and atraumatic Mouth: Mucus membranes moist Eyes: PERRL, EOMI, conjunctivae normal Neck: Supple, No Thyromegaly Cardiovascular: RRR, S1 normal, S2 normal Pulmonary/Chest: Bilateral rhonchi on auscultation Abdominal: Soft. Non-tender, non-distended, bowel sounds are normal, no masses, organomegaly, or guarding present.  Neurological: A&O x3, Strength is normal and symmetric bilaterally, cranial nerve II-XII are grossly intact, no focal motor deficit, sensory intact to light touch bilaterally.  Extremities : No Cyanosis, Clubbing or Edema  Labs on Admission:  Basic Metabolic Panel:  Recent Labs Lab 10/17/15 1504  NA 141  K 3.3*  CL 106  CO2 27  GLUCOSE 101*  BUN 10  CREATININE 0.57  CALCIUM 8.1*   Liver Function Tests:  Recent Labs Lab 10/17/15 1504  AST 27  ALT 24  ALKPHOS 66  BILITOT 1.0  PROT 6.8  ALBUMIN 3.0*   CBC:  Recent Labs Lab 10/17/15 1504  WBC 7.7  NEUTROABS 4.5  HGB 12.2  HCT 36.6  MCV 97.3  PLT 313    Radiological Exams on Admission: Dg Chest 2 View  10/17/2015  CLINICAL DATA:  Shortness of breath and productive cough for 2 weeks. Ex-smoker, COPD, diabetic. EXAM: CHEST  2 VIEW COMPARISON:  Chest x-ray dated 12/07/2013. FINDINGS: Lungs are hyperexpanded compatible with the given history of COPD. Coarse interstitial lung markings again noted bilaterally suggesting chronic interstitial lung disease. No new confluent airspace opacity to suggest a developing pneumonia. Probable atelectasis and/or chronic pleural thickening at each lung base, left greater than right.  There are multiple wedge compression deformities within the thoracic spine which appear chronic and predominantly similar in degree when compared to an earlier chest CT of 06/13/2010. Stable levoscoliosis of the thoracic spine. No acute-appearing osseous abnormality. IMPRESSION: 1. Hyperexpanded lungs indicating COPD/emphysema. Suspect associated chronic bronchitic changes centrally and a diffuse bilateral chronic interstitial lung disease. 2. No acute findings.  No evidence of pneumonia. 3. Additional chronic/incidental findings detailed above. Electronically Signed   By: Franki Cabot M.D.   On: 10/17/2015 15:38    EKG: Independently reviewed. Atrial  fibrillation   Assessment/Plan Active Problems:   Persistent atrial fibrillation (HCC)   Hypothyroidism   COPD exacerbation (HCC)   Hypokalemia  COPD exacerbation Admit the patient in telemetry, start Solu-Medrol 60 mg IV every 6 hours. Mucinex one tablet by mouth twice a day. Doxycycline 100 g by mouth twice a day. Will start Xopenex nebulizer along with ipratropium nebulizers every 6 hours  Hypokalemia Replace potassium and check BMP in a.m.  Atrial fibrillation Continue Coumadin. Heart rate is well controlled.  Hypothyroidism Continue Synthroid     Code status: DNR  Family discussion: Admission, patients condition and plan of care including tests being ordered have been discussed with the patient and her friend at bedside* who indicate understanding and agree with the plan and Code Status.   Time Spent on Admission: 60 min  [facility] Pager: [phone] 10/17/2015, 5:55 PM  If 7PM-7AM, please contact night-coverage  [email or web address]  Password [email or web address]

## 2015-10-18 ENCOUNTER — Encounter (HOSPITAL_COMMUNITY): Payer: Self-pay

## 2015-10-18 DIAGNOSIS — I482 Chronic atrial fibrillation: Secondary | ICD-10-CM | POA: Diagnosis present

## 2015-10-18 DIAGNOSIS — M810 Age-related osteoporosis without current pathological fracture: Secondary | ICD-10-CM | POA: Diagnosis present

## 2015-10-18 DIAGNOSIS — Z66 Do not resuscitate: Secondary | ICD-10-CM | POA: Diagnosis present

## 2015-10-18 DIAGNOSIS — K219 Gastro-esophageal reflux disease without esophagitis: Secondary | ICD-10-CM | POA: Diagnosis present

## 2015-10-18 DIAGNOSIS — Z823 Family history of stroke: Secondary | ICD-10-CM | POA: Diagnosis not present

## 2015-10-18 DIAGNOSIS — I481 Persistent atrial fibrillation: Secondary | ICD-10-CM

## 2015-10-18 DIAGNOSIS — E876 Hypokalemia: Secondary | ICD-10-CM | POA: Diagnosis present

## 2015-10-18 DIAGNOSIS — H409 Unspecified glaucoma: Secondary | ICD-10-CM | POA: Diagnosis present

## 2015-10-18 DIAGNOSIS — E782 Mixed hyperlipidemia: Secondary | ICD-10-CM | POA: Diagnosis present

## 2015-10-18 DIAGNOSIS — Z87891 Personal history of nicotine dependence: Secondary | ICD-10-CM | POA: Diagnosis not present

## 2015-10-18 DIAGNOSIS — J441 Chronic obstructive pulmonary disease with (acute) exacerbation: Secondary | ICD-10-CM | POA: Diagnosis not present

## 2015-10-18 DIAGNOSIS — Z8249 Family history of ischemic heart disease and other diseases of the circulatory system: Secondary | ICD-10-CM | POA: Diagnosis not present

## 2015-10-18 DIAGNOSIS — E039 Hypothyroidism, unspecified: Secondary | ICD-10-CM | POA: Diagnosis present

## 2015-10-18 DIAGNOSIS — N182 Chronic kidney disease, stage 2 (mild): Secondary | ICD-10-CM | POA: Diagnosis present

## 2015-10-18 DIAGNOSIS — Z85828 Personal history of other malignant neoplasm of skin: Secondary | ICD-10-CM | POA: Diagnosis not present

## 2015-10-18 DIAGNOSIS — Z7901 Long term (current) use of anticoagulants: Secondary | ICD-10-CM | POA: Diagnosis not present

## 2015-10-18 DIAGNOSIS — Z800 Family history of malignant neoplasm of digestive organs: Secondary | ICD-10-CM | POA: Diagnosis not present

## 2015-10-18 DIAGNOSIS — E1122 Type 2 diabetes mellitus with diabetic chronic kidney disease: Secondary | ICD-10-CM | POA: Diagnosis present

## 2015-10-18 DIAGNOSIS — Z8673 Personal history of transient ischemic attack (TIA), and cerebral infarction without residual deficits: Secondary | ICD-10-CM | POA: Diagnosis not present

## 2015-10-18 DIAGNOSIS — I129 Hypertensive chronic kidney disease with stage 1 through stage 4 chronic kidney disease, or unspecified chronic kidney disease: Secondary | ICD-10-CM | POA: Diagnosis present

## 2015-10-18 DIAGNOSIS — H3530 Unspecified macular degeneration: Secondary | ICD-10-CM | POA: Diagnosis present

## 2015-10-18 LAB — COMPREHENSIVE METABOLIC PANEL
ALT: 22 U/L (ref 14–54)
ANION GAP: 8 (ref 5–15)
AST: 24 U/L (ref 15–41)
Albumin: 2.8 g/dL — ABNORMAL LOW (ref 3.5–5.0)
Alkaline Phosphatase: 61 U/L (ref 38–126)
BILIRUBIN TOTAL: 0.7 mg/dL (ref 0.3–1.2)
BUN: 15 mg/dL (ref 6–20)
CHLORIDE: 110 mmol/L (ref 101–111)
CO2: 24 mmol/L (ref 22–32)
Calcium: 8.2 mg/dL — ABNORMAL LOW (ref 8.9–10.3)
Creatinine, Ser: 0.51 mg/dL (ref 0.44–1.00)
Glucose, Bld: 179 mg/dL — ABNORMAL HIGH (ref 65–99)
Potassium: 4 mmol/L (ref 3.5–5.1)
Sodium: 142 mmol/L (ref 135–145)
TOTAL PROTEIN: 6.6 g/dL (ref 6.5–8.1)

## 2015-10-18 LAB — PROTIME-INR
INR: 2.2 — AB (ref 0.00–1.49)
PROTHROMBIN TIME: 24.3 s — AB (ref 11.6–15.2)

## 2015-10-18 LAB — CBC
HEMATOCRIT: 35.3 % — AB (ref 36.0–46.0)
Hemoglobin: 11.9 g/dL — ABNORMAL LOW (ref 12.0–15.0)
MCH: 33 pg (ref 26.0–34.0)
MCHC: 33.7 g/dL (ref 30.0–36.0)
MCV: 97.8 fL (ref 78.0–100.0)
Platelets: 340 10*3/uL (ref 150–400)
RBC: 3.61 MIL/uL — AB (ref 3.87–5.11)
RDW: 13.2 % (ref 11.5–15.5)
WBC: 5.7 10*3/uL (ref 4.0–10.5)

## 2015-10-18 MED ORDER — ACETAMINOPHEN 325 MG PO TABS
650.0000 mg | ORAL_TABLET | Freq: Once | ORAL | Status: AC
  Administered 2015-10-18: 650 mg via ORAL
  Filled 2015-10-18: qty 2

## 2015-10-18 MED ORDER — WARFARIN SODIUM 5 MG PO TABS
5.0000 mg | ORAL_TABLET | Freq: Once | ORAL | Status: DC
  Filled 2015-10-18: qty 1

## 2015-10-18 MED ORDER — LATANOPROST 0.005 % OP SOLN
OPHTHALMIC | Status: AC
  Filled 2015-10-18: qty 2.5

## 2015-10-18 NOTE — Progress Notes (Signed)
[facility] for Warfarin Indication: atrial fibrillation  Allergies  Allergen Reactions  . Propoxyphene N-Acetaminophen Shortness Of Breath  . Codeine Nausea And Vomiting  . Meperidine Hcl Nausea And Vomiting  . Morphine Nausea And Vomiting  . Shellfish Allergy Hives    Patient Measurements: Height: 5\' 6"  (167.6 cm) Weight: 122 lb (55.339 kg) IBW/kg (Calculated) : 59.3  Vital Signs: Temp: 97.8 F (36.6 C) (03/31 0647) Temp Source: Oral (03/31 0647) BP: 139/88 mmHg (03/31 0647) Pulse Rate: 92 (03/31 0647)  Labs:  Recent Labs  10/16/15 0947 10/17/15 1504 10/18/15 0628  HGB  --  12.2 11.9*  HCT  --  36.6 35.3*  PLT  --  313 340  LABPROT  --  24.6* 24.3*  INR 3.2 2.25* 2.20*  CREATININE  --  0.57 0.51    Estimated Creatinine Clearance: 42.4 mL/min (by C-G formula based on Cr of 0.51).   Medical History: Past Medical History  Diagnosis Date  . History of TIA (transient ischemic attack)   . Hypothyroidism   . History of rheumatic fever   . Osteoporosis 04/2013    Thoracic compression fracture, on bisphosphonate and cal/vit D  . Atrial fibrillation (HCC)     Paroxysmal on coumadin  . Mixed hyperlipidemia   . Essential hypertension, benign   . Diabetes type 2, controlled ([name])     Diet controlled  . Pulmonary nodules     Stable bilateral on CT 01/2010, rec rpt yearly for 2 yrs, pt decided to stop f/u  . Aortic stenosis   . Anemia   . Arthritis   . History of skin cancer   . Anxiety   . Diverticulosis   . GERD (gastroesophageal reflux disease)   . Seasonal allergies   . History of pelvic fracture April 2013  . COPD (chronic obstructive pulmonary disease) ([place])   . Chronic gastritis 10/2009    Duodenitis on EGD  . Hiatal hernia 2011    Small  . Collagen vascular disease ([place])   . Glaucoma     Dr. Venetia Maxon  . Dry ARMD     Retinal hemorrhage (09/2013) [place]  . CKD (chronic kidney disease) stage 2, GFR 60-89 ml/min    . Macular degeneration   . Abnormal drug screen 06/2015    klonopin negative. ?regular use    Medications:  Prescriptions prior to admission  Medication Sig Dispense Refill Last Dose  . Calcium-Magnesium-Vitamin D [id number] MG-MG-UNIT TABS Take 1 tablet by mouth 2 (two) times daily.   Past Week at Unknown time  . cromolyn (OPTICROM) 4 % ophthalmic solution Place 1 drop into both eyes 4 (four) times daily as needed.  0 10/17/2015 at Unknown time  . doxycycline (VIBRA-TABS) 100 MG tablet Take 1 tablet (100 mg total) by mouth 2 (two) times daily. 20 tablet 0 10/17/2015 at Unknown time  . felodipine (PLENDIL) 5 MG 24 hr tablet Take 1 tablet (5 mg total) by mouth daily. 90 tablet 4 10/17/2015 at Unknown time  . latanoprost (XALATAN) 0.005 % ophthalmic solution Place 1 drop into both eyes at bedtime.    10/16/2015 at Unknown time  . levothyroxine (SYNTHROID, LEVOTHROID) 50 MCG tablet TAKE 1 TABLET DAILY 90 tablet 3 10/17/2015 at Unknown time  . Magnesium Oxide 500 MG (LAX) TABS Take 1 tablet by mouth 3 (three) times a week.   Past Week at Unknown time  . Multiple Vitamin (MULTIVITAMIN) capsule Take 1 capsule by mouth every morning.  10/17/2015 at Unknown time  . Multiple Vitamins-Minerals (ICAPS AREDS FORMULA PO) Take 1 tablet by mouth daily.    10/17/2015 at Unknown time  . Omega-3 Fatty Acids (FISH OIL CONCENTRATE) 1000 MG CAPS Take 2 capsules by mouth 2 (two) times daily.     10/17/2015 at Unknown time  . sertraline (ZOLOFT) 50 MG tablet TAKE 1 TABLET (50 MG TOTAL) BY MOUTH DAILY. **MUST HAVE PHYSICAL FOR FURTHER REFILLS** (Patient taking differently: Take 50 mg by mouth at bedtime. TAKE 1 TABLET (50 MG TOTAL) BY MOUTH DAILY. **MUST HAVE PHYSICAL FOR FURTHER REFILLS**) 90 tablet 1 10/16/2015 at 2000  . vitamin B-12 (CYANOCOBALAMIN) 500 MCG tablet Take 500 mcg by mouth daily.   Past Week at Unknown time  . warfarin (COUMADIN) 2.5 MG tablet Take 2 tablets daily except 1 tablet on Sundays, Tuesdays and  Thursdays (Patient taking differently: Take 2.5-5 mg by mouth at bedtime. Take 2 tablets daily except 1 tablet on Thursday,Saturday,Sunday,Tuesday for 1 week while on antibiotic) 180 tablet 3 10/16/2015 at 2000  . acetaminophen (TYLENOL) 500 MG tablet Take 1 tablet (500 mg total) by mouth 3 (three) times daily as needed.   unknown  . clonazePAM (KLONOPIN) 0.5 MG tablet Take 1 tablet (0.5 mg total) by mouth at bedtime as needed. (Patient taking differently: Take 0.5 mg by mouth at bedtime as needed for anxiety. ) 90 tablet 0 unknown  . glucose blood test strip Use as instructed--FREESTYLE LITE 100 each 3 Taking  . ibandronate (BONIVA) 3 MG/3ML SOLN injection Inject 3 mg into the vein every 3 (three) months.  3.6 mL  4 months  . Lancets (FREESTYLE) lancets E11.9 Use as instructed 50 each 3 Taking  . nystatin (MYCOSTATIN) 100000 UNIT/ML suspension Take 5 mLs (500,000 Units total) by mouth 3 (three) times daily. (Patient not taking: Reported on 10/17/2015) 180 mL 0 Taking  . polyethylene glycol (MIRALAX / GLYCOLAX) packet Take 17 g by mouth daily. As needed for constipation 30 each 3 unknown    Assessment:  INR remains therapeutic.  No bleeding complications noted  Goal of Therapy:  INR 2-3   Plan:  Warfarin 5 mg PO x 1 today. Daily PT/INR  [name] 10/18/2015,8:52 AM

## 2015-10-18 NOTE — Progress Notes (Signed)
[facility] PROGRESS NOTE  [name] [id number] DOB: 05-01-1926 DOA: 10/17/2015 PCP: Ria Bush, MD  HPI/Brief narrative [phone] who presented with 1 week hx of cough with increased sob and wheezing. Patient was admitted for further work up of copd exacerbation  Assessment/Plan: COPD exacerbation Patient is continued on Solu-Medrol 60 mg IV every 6 hours. Continue Mucinex one tablet by mouth twice a day. Patient started on Doxycycline 100 g by mouth twice a day. Will continue Xopenex nebulizer along with ipratropium nebulizers every 6 hours Patient has noted improvement this AM  Hypokalemia Replaced  Atrial fibrillation Continue Coumadin. Heart rate is controlled.  Hypothyroidism Continue Synthroid as tolerated  Code Status: DNR Family Communication: pt in room, family at bedside Disposition Plan: Possible d/c in 48-72hrs   Consultants:    Procedures:    Antibiotics: Anti-infectives    Start     Dose/Rate Route Frequency Ordered Stop   10/17/15 2200  doxycycline (VIBRA-TABS) tablet 100 mg     100 mg Oral 2 times daily 10/17/15 2044         HPI/Subjective: Feels much better today  Objective: Filed Vitals:   10/17/15 1947 10/17/15 2126 10/18/15 0647 10/18/15 0821  BP: 144/78  139/88   Pulse: 112  92   Temp: 97.5 F (36.4 C)  97.8 F (36.6 C)   TempSrc: Oral  Oral   Resp: 16  16   Height: 5\' 6"  (1.676 m)     Weight: 55.339 kg (122 lb)     SpO2: 97% 97% 94% 98%    Intake/Output Summary (Last 24 hours) at 10/18/15 1305 Last data filed at 10/18/15 0900  Gross per 24 hour  Intake    240 ml  Output      0 ml  Net    240 ml   Filed Weights   10/17/15 1201 10/17/15 1947  Weight: 56.246 kg (124 lb) 55.339 kg (122 lb)    Exam:   General:  Awake, in nad  Cardiovascular: regular, s1, s2  Respiratory: normal resp effort, end-expiratory wheezing B  Abdomen: soft, nondistended  Musculoskeletal: perfused, no clubbing   Data  Reviewed: Basic Metabolic Panel:  Recent Labs Lab 10/17/15 1504 10/18/15 0628  NA 141 142  K 3.3* 4.0  CL 106 110  CO2 27 24  GLUCOSE 101* 179*  BUN 10 15  CREATININE 0.57 0.51  CALCIUM 8.1* 8.2*   Liver Function Tests:  Recent Labs Lab 10/17/15 1504 10/18/15 0628  AST 27 24  ALT 24 22  ALKPHOS 66 61  BILITOT 1.0 0.7  PROT 6.8 6.6  ALBUMIN 3.0* 2.8*   No results for input(s): LIPASE, AMYLASE in the last 168 hours. No results for input(s): AMMONIA in the last 168 hours. CBC:  Recent Labs Lab 10/17/15 1504 10/18/15 0628  WBC 7.7 5.7  NEUTROABS 4.5  --   HGB 12.2 11.9*  HCT 36.6 35.3*  MCV 97.3 97.8  PLT 313 340   Cardiac Enzymes: No results for input(s): CKTOTAL, CKMB, CKMBINDEX, TROPONINI in the last 168 hours. BNP (last 3 results) No results for input(s): BNP in the last 8760 hours.  ProBNP (last 3 results) No results for input(s): PROBNP in the last 8760 hours.  CBG: No results for input(s): GLUCAP in the last 168 hours.  No results found for this or any previous visit (from the past 240 hour(s)).   Studies: Dg Chest 2 View  10/17/2015  CLINICAL DATA:  Shortness of breath and productive cough for  2 weeks. Ex-smoker, COPD, diabetic. EXAM: CHEST  2 VIEW COMPARISON:  Chest x-ray dated 12/07/2013. FINDINGS: Lungs are hyperexpanded compatible with the given history of COPD. Coarse interstitial lung markings again noted bilaterally suggesting chronic interstitial lung disease. No new confluent airspace opacity to suggest a developing pneumonia. Probable atelectasis and/or chronic pleural thickening at each lung base, left greater than right. There are multiple wedge compression deformities within the thoracic spine which appear chronic and predominantly similar in degree when compared to an earlier chest CT of 06/13/2010. Stable levoscoliosis of the thoracic spine. No acute-appearing osseous abnormality. IMPRESSION: 1. Hyperexpanded lungs indicating  COPD/emphysema. Suspect associated chronic bronchitic changes centrally and a diffuse bilateral chronic interstitial lung disease. 2. No acute findings.  No evidence of pneumonia. 3. Additional chronic/incidental findings detailed above. Electronically Signed   By: Franki Cabot M.D.   On: 10/17/2015 15:38    Scheduled Meds: . doxycycline  100 mg Oral BID  . felodipine  5 mg Oral Daily  . guaiFENesin  600 mg Oral BID  . ipratropium  0.5 mg Nebulization Q6H WA  . latanoprost  1 drop Both Eyes QHS  . levalbuterol  0.63 mg Nebulization Q6H WA  . levothyroxine  50 mcg Oral QAC breakfast  . methylPREDNISolone (SOLU-MEDROL) injection  60 mg Intravenous Q6H  . multivitamin with minerals  1 tablet Oral Daily  . omega-3 acid ethyl esters  1 g Oral Daily  . sertraline  50 mg Oral QHS  . sodium chloride flush  3 mL Intravenous Q12H  . vitamin B-12  500 mcg Oral Daily  . warfarin  5 mg Oral Once  . Warfarin - Pharmacist Dosing Inpatient   Does not apply Q24H   Continuous Infusions: . sodium chloride 10 mL/hr at 10/17/15 2045    Active Problems:   Persistent atrial fibrillation (HCC)   Hypothyroidism   COPD exacerbation ([place])    CHIU, STEPHEN K  [facility] Pager [phone]. If 7PM-7AM, please contact night-coverage at [email or web address], password [facility] 10/18/2015, 1:05 PM  LOS: 1 day

## 2015-10-18 NOTE — Progress Notes (Signed)
Patient refuses bed alarm. She says, "I have done this myself for 89 years, I know what I am doing." Explained to patient the importance of calling when she needs to get up, and that this was for her safety due to her feeling weak.

## 2015-10-19 LAB — PROTIME-INR
INR: 3.17 — ABNORMAL HIGH (ref 0.00–1.49)
Prothrombin Time: 31.9 seconds — ABNORMAL HIGH (ref 11.6–15.2)

## 2015-10-19 MED ORDER — METHYLPREDNISOLONE SODIUM SUCC 125 MG IJ SOLR
60.0000 mg | Freq: Two times a day (BID) | INTRAMUSCULAR | Status: DC
  Administered 2015-10-19 – 2015-10-20 (×2): 60 mg via INTRAVENOUS
  Filled 2015-10-19 (×2): qty 2

## 2015-10-19 MED ORDER — ACETAMINOPHEN 325 MG PO TABS: 650.0000 mg | ORAL_TABLET | Freq: Four times a day (QID) | ORAL | Status: DC | PRN

## 2015-10-19 NOTE — Progress Notes (Signed)
[facility] PROGRESS NOTE  [name] [id number] DOB: 13-Jan-1926 DOA: 10/17/2015 PCP: Ria Bush, MD  HPI/Brief narrative [phone] who presented with 1 week hx of cough with increased sob and wheezing. Patient was admitted for further work up of copd exacerbation  Assessment/Plan: COPD exacerbation Reports improvement this AM and inquiring about going home Continue Mucinex one tablet by mouth twice a day. Patient started on Doxycycline 100 g by mouth twice a day. Will continue Xopenex nebulizer along with ipratropium nebulizers every 6 hours Pt currently on q6hr solumedrol. Will decrease to q12 today and possibly once daily tomorrow  Hypokalemia Replaced  Atrial fibrillation Continue Coumadin. Heart rate is controlled.  Hypothyroidism Continue Synthroid as tolerated  Code Status: DNR Family Communication: pt in room, family at bedside Disposition Plan: Possible d/c in 24-48hrs   Consultants:    Procedures:    Antibiotics: Anti-infectives    Start     Dose/Rate Route Frequency Ordered Stop   10/17/15 2200  doxycycline (VIBRA-TABS) tablet 100 mg     100 mg Oral 2 times daily 10/17/15 2044        HPI/Subjective: Feels better today. Asking about going home  Objective: Filed Vitals:   10/18/15 2203 10/19/15 0503 10/19/15 0740 10/19/15 1354  BP: 103/61 138/75    Pulse: 87 86    Temp: 98.2 F (36.8 C) 98.1 F (36.7 C)    TempSrc: Oral Oral    Resp: 18 20    Height:      Weight:      SpO2: 96% 98% 99% 98%    Intake/Output Summary (Last 24 hours) at 10/19/15 1415 Last data filed at 10/19/15 0838  Gross per 24 hour  Intake    243 ml  Output      0 ml  Net    243 ml   Filed Weights   10/17/15 1201 10/17/15 1947  Weight: 56.246 kg (124 lb) 55.339 kg (122 lb)    Exam:   General:  Awake, in nad, laying in bed  Cardiovascular: regular, s1, s2  Respiratory: normal resp effort, no audible wheezing on exam today  Abdomen: soft,  nondistended  Musculoskeletal: perfused, no clubbing   Data Reviewed: Basic Metabolic Panel:  Recent Labs Lab 10/17/15 1504 10/18/15 0628  NA 141 142  [name] 3.3* 4.0  CL 106 110  CO2 27 24  GLUCOSE 101* 179*  BUN 10 15  CREATININE 0.57 0.51  CALCIUM 8.1* 8.2*   Liver Function Tests:  Recent Labs Lab 10/17/15 1504 10/18/15 0628  AST 27 24  ALT 24 22  ALKPHOS 66 61  BILITOT 1.0 0.7  PROT 6.8 6.6  ALBUMIN 3.0* 2.8*   No results for input(s): LIPASE, AMYLASE in the last 168 hours. No results for input(s): AMMONIA in the last 168 hours. CBC:  Recent Labs Lab 10/17/15 1504 10/18/15 0628  WBC 7.7 5.7  NEUTROABS 4.5  --   HGB 12.2 11.9*  HCT 36.6 35.3*  MCV 97.3 97.8  PLT 313 340   Cardiac Enzymes: No results for input(s): CKTOTAL, CKMB, CKMBINDEX, TROPONINI in the last 168 hours. BNP (last 3 results) No results for input(s): BNP in the last 8760 hours.  ProBNP (last 3 results) No results for input(s): PROBNP in the last 8760 hours.  CBG: No results for input(s): GLUCAP in the last 168 hours.  No results found for this or any previous visit (from the past 240 hour(s)).   Studies: Dg Chest 2 View  10/17/2015  CLINICAL  DATA:  Shortness of breath and productive cough for 2 weeks. Ex-smoker, COPD, diabetic. EXAM: CHEST  2 VIEW COMPARISON:  Chest x-ray dated 12/07/2013. FINDINGS: Lungs are hyperexpanded compatible with the given history of COPD. Coarse interstitial lung markings again noted bilaterally suggesting chronic interstitial lung disease. No new confluent airspace opacity to suggest a developing pneumonia. Probable atelectasis and/or chronic pleural thickening at each lung base, left greater than right. There are multiple wedge compression deformities within the thoracic spine which appear chronic and predominantly similar in degree when compared to an earlier chest CT of 06/13/2010. Stable levoscoliosis of the thoracic spine. No acute-appearing osseous  abnormality. IMPRESSION: 1. Hyperexpanded lungs indicating COPD/emphysema. Suspect associated chronic bronchitic changes centrally and a diffuse bilateral chronic interstitial lung disease. 2. No acute findings.  No evidence of pneumonia. 3. Additional chronic/incidental findings detailed above. Electronically Signed   By: Franki Cabot M.D.   On: 10/17/2015 15:38    Scheduled Meds: . doxycycline  100 mg Oral BID  . felodipine  5 mg Oral Daily  . guaiFENesin  600 mg Oral BID  . ipratropium  0.5 mg Nebulization Q6H WA  . latanoprost  1 drop Both Eyes QHS  . levalbuterol  0.63 mg Nebulization Q6H WA  . levothyroxine  50 mcg Oral QAC breakfast  . methylPREDNISolone (SOLU-MEDROL) injection  60 mg Intravenous Q12H  . multivitamin with minerals  1 tablet Oral Daily  . omega-3 acid ethyl esters  1 g Oral Daily  . sertraline  50 mg Oral QHS  . sodium chloride flush  3 mL Intravenous Q12H  . vitamin B-12  500 mcg Oral Daily  . warfarin  5 mg Oral Once  . Warfarin - Pharmacist Dosing Inpatient   Does not apply Q24H   Continuous Infusions: . sodium chloride 10 mL/hr at 10/17/15 2045    Active Problems:   Persistent atrial fibrillation (HCC)   Hypothyroidism   COPD exacerbation ([place])    [name]  [facility] Pager [phone]. If 7PM-7AM, please contact night-coverage at [email or web address], password [facility] 10/19/2015, 2:15 PM  LOS: 2 days

## 2015-10-19 NOTE — Progress Notes (Signed)
[place] for Warfarin Indication: atrial fibrillation  Allergies  Allergen Reactions  . Propoxyphene N-Acetaminophen Shortness Of Breath  . Codeine Nausea And Vomiting  . Meperidine Hcl Nausea And Vomiting  . Morphine Nausea And Vomiting  . Shellfish Allergy Hives    Patient Measurements: Height: 5\' 6"  (167.6 cm) Weight: 122 lb (55.339 kg) IBW/kg (Calculated) : 59.3  Vital Signs: Temp: 98.1 F (36.7 C) (04/01 0503) Temp Source: Oral (04/01 0503) BP: 138/75 mmHg (04/01 0503) Pulse Rate: 86 (04/01 0503)  Labs:  Recent Labs  10/17/15 1504 10/18/15 0628 10/19/15 0615  HGB 12.2 11.9*  --   HCT 36.6 35.3*  --   PLT 313 340  --   LABPROT 24.6* 24.3* 31.9*  INR 2.25* 2.20* 3.17*  CREATININE 0.57 0.51  --     Estimated Creatinine Clearance: 42.4 mL/min (by C-G formula based on Cr of 0.51).   Medical History: Past Medical History  Diagnosis Date  . History of TIA (transient ischemic attack)   . Hypothyroidism   . History of rheumatic fever   . Osteoporosis 04/2013    Thoracic compression fracture, on bisphosphonate and cal/vit D  . Atrial fibrillation (HCC)     Paroxysmal on coumadin  . Mixed hyperlipidemia   . Essential hypertension, benign   . Diabetes type 2, controlled ([place])     Diet controlled  . Pulmonary nodules     Stable bilateral on CT 01/2010, rec rpt yearly for 2 yrs, pt decided to stop f/u  . Aortic stenosis   . Anemia   . Arthritis   . History of skin cancer   . Anxiety   . Diverticulosis   . GERD (gastroesophageal reflux disease)   . Seasonal allergies   . History of pelvic fracture April 2013  . COPD (chronic obstructive pulmonary disease) ([place])   . Chronic gastritis 10/2009    Duodenitis on EGD  . Hiatal hernia 2011    Small  . Collagen vascular disease ([place])   . Glaucoma     Dr. Venetia Maxon  . Dry ARMD     Retinal hemorrhage (09/2013) [place]  . CKD (chronic kidney disease) stage 2, GFR 60-89 ml/min    . Macular degeneration   . Abnormal drug screen 06/2015    klonopin negative. ?regular use    Medications:  Prescriptions prior to admission  Medication Sig Dispense Refill Last Dose  . Calcium-Magnesium-Vitamin D [id number] MG-MG-UNIT TABS Take 1 tablet by mouth 2 (two) times daily.   Past Week at Unknown time  . cromolyn (OPTICROM) 4 % ophthalmic solution Place 1 drop into both eyes 4 (four) times daily as needed.  0 10/17/2015 at Unknown time  . doxycycline (VIBRA-TABS) 100 MG tablet Take 1 tablet (100 mg total) by mouth 2 (two) times daily. 20 tablet 0 10/17/2015 at Unknown time  . felodipine (PLENDIL) 5 MG 24 hr tablet Take 1 tablet (5 mg total) by mouth daily. 90 tablet 4 10/17/2015 at Unknown time  . latanoprost (XALATAN) 0.005 % ophthalmic solution Place 1 drop into both eyes at bedtime.    10/16/2015 at Unknown time  . levothyroxine (SYNTHROID, LEVOTHROID) 50 MCG tablet TAKE 1 TABLET DAILY 90 tablet 3 10/17/2015 at Unknown time  . Magnesium Oxide 500 MG (LAX) TABS Take 1 tablet by mouth 3 (three) times a week.   Past Week at Unknown time  . Multiple Vitamin (MULTIVITAMIN) capsule Take 1 capsule by mouth every morning.    10/17/2015 at  Unknown time  . Multiple Vitamins-Minerals (ICAPS AREDS FORMULA PO) Take 1 tablet by mouth daily.    10/17/2015 at Unknown time  . Omega-3 Fatty Acids (FISH OIL CONCENTRATE) 1000 MG CAPS Take 2 capsules by mouth 2 (two) times daily.     10/17/2015 at Unknown time  . sertraline (ZOLOFT) 50 MG tablet TAKE 1 TABLET (50 MG TOTAL) BY MOUTH DAILY. **MUST HAVE PHYSICAL FOR FURTHER REFILLS** (Patient taking differently: Take 50 mg by mouth at bedtime. TAKE 1 TABLET (50 MG TOTAL) BY MOUTH DAILY. **MUST HAVE PHYSICAL FOR FURTHER REFILLS**) 90 tablet 1 10/16/2015 at 2000  . vitamin B-12 (CYANOCOBALAMIN) 500 MCG tablet Take 500 mcg by mouth daily.   Past Week at Unknown time  . warfarin (COUMADIN) 2.5 MG tablet Take 2 tablets daily except 1 tablet on Sundays, Tuesdays and  Thursdays (Patient taking differently: Take 2.5-5 mg by mouth at bedtime. Take 2 tablets daily except 1 tablet on Thursday,Saturday,Sunday,Tuesday for 1 week while on antibiotic) 180 tablet 3 10/16/2015 at 2000  . acetaminophen (TYLENOL) 500 MG tablet Take 1 tablet (500 mg total) by mouth 3 (three) times daily as needed.   unknown  . clonazePAM (KLONOPIN) 0.5 MG tablet Take 1 tablet (0.5 mg total) by mouth at bedtime as needed. (Patient taking differently: Take 0.5 mg by mouth at bedtime as needed for anxiety. ) 90 tablet 0 unknown  . glucose blood test strip Use as instructed--FREESTYLE LITE 100 each 3 Taking  . ibandronate (BONIVA) 3 MG/3ML SOLN injection Inject 3 mg into the vein every 3 (three) months.  3.6 mL  4 months  . Lancets (FREESTYLE) lancets E11.9 Use as instructed 50 each 3 Taking  . nystatin (MYCOSTATIN) 100000 UNIT/ML suspension Take 5 mLs (500,000 Units total) by mouth 3 (three) times daily. (Patient not taking: Reported on 10/17/2015) 180 mL 0 Taking  . polyethylene glycol (MIRALAX / GLYCOLAX) packet Take 17 g by mouth daily. As needed for constipation 30 each 3 unknown    Assessment:  INR increased to 3.17 today.  Coumadin not given yesterday as ordered.  Doxycycline started which may increase effects of coumadin.  No bleeding noted  Goal of Therapy:  INR 2-3   Plan:  No coumadin today Daily PT/INR Monitor for bleeding complications  Georgio Hattabaugh Poteet 10/19/2015,8:37 AM

## 2015-10-20 LAB — PROTIME-INR
INR: 3.3 — ABNORMAL HIGH (ref 0.00–1.49)
PROTHROMBIN TIME: 32.9 s — AB (ref 11.6–15.2)

## 2015-10-20 MED ORDER — PREDNISONE 5 MG PO TABS: 5.0000 mg | ORAL_TABLET | Freq: Every day | ORAL | Status: DC

## 2015-10-20 MED ORDER — ALBUTEROL SULFATE HFA 108 (90 BASE) MCG/ACT IN AERS: 2.0000 | INHALATION_SPRAY | Freq: Four times a day (QID) | RESPIRATORY_TRACT | Status: DC | PRN

## 2015-10-20 MED ORDER — IPRATROPIUM BROMIDE 0.02 % IN SOLN
0.5000 mg | Freq: Three times a day (TID) | RESPIRATORY_TRACT | Status: DC
  Administered 2015-10-20: 0.5 mg via RESPIRATORY_TRACT
  Filled 2015-10-20: qty 2.5

## 2015-10-20 MED ORDER — LEVALBUTEROL HCL 0.63 MG/3ML IN NEBU
0.6300 mg | INHALATION_SOLUTION | Freq: Three times a day (TID) | RESPIRATORY_TRACT | Status: DC
  Administered 2015-10-20: 0.63 mg via RESPIRATORY_TRACT
  Filled 2015-10-20: qty 3

## 2015-10-20 NOTE — Progress Notes (Signed)
Patient alert and oriented, independent, VSS, pt. Tolerating diet well. No complaints of pain or nausea. Pt. Had IV removed tip intact. Pt. Had prescriptions given. Pt. Voiced understanding of discharge instructions with no further questions. Pt. Discharged via wheelchair with auxilliary.  

## 2015-10-20 NOTE — Progress Notes (Signed)
[place] for Warfarin Indication: atrial fibrillation   Labs:  Recent Labs  10/17/15 1504 10/18/15 0628 10/19/15 0615 10/20/15 0625  HGB 12.2 11.9*  --   --   HCT 36.6 35.3*  --   --   PLT 313 340  --   --   LABPROT 24.6* 24.3* 31.9* 32.9*  INR 2.25* 2.20* 3.17* 3.30*  CREATININE 0.57 0.51  --   --     Estimated Creatinine Clearance: 42.4 mL/min (by C-G formula based on Cr of 0.51).  Assessment:  INR increased to 3.3 today.  Coumadin not given past 2 days.  No bleeding reported  Doxycycline started which may increase effects of coumadin.  Goal of Therapy:  INR 2-3   Plan:  No coumadin today Daily PT/INR Monitor for bleeding complications  [name] 10/20/2015,8:50 AM

## 2015-10-20 NOTE — Discharge Summary (Addendum)
Physician Discharge Summary  [name] [id number] DOB: 01-29-1926 DOA: 10/17/2015  PCP: Ria Bush, MD  Admit date: 10/17/2015 Discharge date: 10/20/2015  Time spent: 20 minutes  Recommendations for Outpatient Follow-up:  1. Follow up with PCP in 2-3 weeks   Discharge Diagnoses:  Active Problems:   Persistent atrial fibrillation (HCC)   Hypothyroidism   COPD exacerbation [facility])   Discharge Condition: Improved  Diet recommendation: Heart healthy  Filed Weights   10/17/15 1201 10/17/15 1947  Weight: 56.246 kg (124 lb) 55.339 kg (122 lb)    History of present illness:  Please review dictated H and P from 3/30 for details. Briefly, 82yo who presented with 1 week hx of cough with increased sob and wheezing. Patient was admitted for further work up of copd exacerbation.  Hospital Course:  COPD exacerbation Patient much improvement since admission. Continued Mucinex one tablet by mouth twice a day. Patient was started on Doxycycline 100 g by mouth twice a day. Continued Xopenex nebulizer along with ipratropium nebulizers every 6 hours Improved with IV steroid taper. To continue PO prednisone taper as outpatient  Hypokalemia Replaced  Chronic Atrial fibrillation CHADS-VASc of 6 Continue Coumadin. Heart rate remained controlled.  Hypothyroidism Continue Synthroid as tolerated  Discharge Exam: Filed Vitals:   10/19/15 1943 10/19/15 2218 10/20/15 0525 10/20/15 0939  BP:  115/56 136/78   Pulse: 74 86 84   Temp:  98 F (36.7 C) 98.1 F (36.7 C)   TempSrc:  Oral Oral   Resp: 16  20   Height:      Weight:      SpO2: 98% 96% 99% 98%    General: Awake, in nad Cardiovascular: regular, s1,s2 Respiratory: normal resp effort, no wheezing  Discharge Instructions     Medication List    TAKE these medications        acetaminophen 500 MG tablet  Commonly known as:  TYLENOL  Take 1 tablet (500 mg total) by mouth 3 (three) times daily as needed.      albuterol 108 (90 Base) MCG/ACT inhaler  Commonly known as:  PROVENTIL HFA;VENTOLIN HFA  Inhale 2 puffs into the lungs every 6 (six) hours as needed for wheezing or shortness of breath.     Calcium-Magnesium-Vitamin D [id number] MG-MG-UNIT Tabs  Take 1 tablet by mouth 2 (two) times daily.     clonazePAM 0.5 MG tablet  Commonly known as:  KLONOPIN  Take 1 tablet (0.5 mg total) by mouth at bedtime as needed.     cromolyn 4 % ophthalmic solution  Commonly known as:  OPTICROM  Place 1 drop into both eyes 4 (four) times daily as needed.     doxycycline 100 MG tablet  Commonly known as:  VIBRA-TABS  Take 1 tablet (100 mg total) by mouth 2 (two) times daily.     felodipine 5 MG 24 hr tablet  Commonly known as:  PLENDIL  Take 1 tablet (5 mg total) by mouth daily.     FISH OIL CONCENTRATE 1000 MG Caps  Take 2 capsules by mouth 2 (two) times daily.     freestyle lancets  E11.9 Use as instructed     glucose blood test strip  Use as instructed--FREESTYLE LITE     ibandronate 3 MG/3ML Soln injection  Commonly known as:  BONIVA  Inject 3 mg into the vein every 3 (three) months.     ICAPS AREDS FORMULA PO  Take 1 tablet by mouth daily.     latanoprost 0.005 %  ophthalmic solution  Commonly known as:  XALATAN  Place 1 drop into both eyes at bedtime.     levothyroxine 50 MCG tablet  Commonly known as:  SYNTHROID, LEVOTHROID  TAKE 1 TABLET DAILY     Magnesium Oxide 500 MG (LAX) Tabs  Take 1 tablet by mouth 3 (three) times a week.     multivitamin capsule  Take 1 capsule by mouth every morning.     nystatin 100000 UNIT/ML suspension  Commonly known as:  MYCOSTATIN  Take 5 mLs (500,000 Units total) by mouth 3 (three) times daily.     polyethylene glycol packet  Commonly known as:  MIRALAX / GLYCOLAX  Take 17 g by mouth daily. As needed for constipation     predniSONE 5 MG tablet  Commonly known as:  DELTASONE  Take 1 tablet (5 mg total) by mouth daily with breakfast.   Start taking on:  10/21/2015     sertraline 50 MG tablet  Commonly known as:  ZOLOFT  TAKE 1 TABLET (50 MG TOTAL) BY MOUTH DAILY. **MUST HAVE PHYSICAL FOR FURTHER REFILLS**     vitamin B-12 500 MCG tablet  Commonly known as:  CYANOCOBALAMIN  Take 500 mcg by mouth daily.     warfarin 2.5 MG tablet  Commonly known as:  COUMADIN  Take 2 tablets daily except 1 tablet on Sundays, Tuesdays and Thursdays       Allergies  Allergen Reactions  . Propoxyphene N-Acetaminophen Shortness Of Breath  . Codeine Nausea And Vomiting  . Meperidine Hcl Nausea And Vomiting  . Morphine Nausea And Vomiting  . Shellfish Allergy Hives      The results of significant diagnostics from this hospitalization (including imaging, microbiology, ancillary and laboratory) are listed below for reference.    Significant Diagnostic Studies: Dg Chest 2 View  10/17/2015  CLINICAL DATA:  Shortness of breath and productive cough for 2 weeks. Ex-smoker, COPD, diabetic. EXAM: CHEST  2 VIEW COMPARISON:  Chest x-ray dated 12/07/2013. FINDINGS: Lungs are hyperexpanded compatible with the given history of COPD. Coarse interstitial lung markings again noted bilaterally suggesting chronic interstitial lung disease. No new confluent airspace opacity to suggest a developing pneumonia. Probable atelectasis and/or chronic pleural thickening at each lung base, left greater than right. There are multiple wedge compression deformities within the thoracic spine which appear chronic and predominantly similar in degree when compared to an earlier chest CT of 06/13/2010. Stable levoscoliosis of the thoracic spine. No acute-appearing osseous abnormality. IMPRESSION: 1. Hyperexpanded lungs indicating COPD/emphysema. Suspect associated chronic bronchitic changes centrally and a diffuse bilateral chronic interstitial lung disease. 2. No acute findings.  No evidence of pneumonia. 3. Additional chronic/incidental findings detailed above. Electronically  Signed   By: Franki Cabot M.D.   On: 10/17/2015 15:38    Microbiology: No results found for this or any previous visit (from the past 240 hour(s)).   Labs: Basic Metabolic Panel:  Recent Labs Lab 10/17/15 1504 10/18/15 0628  NA 141 142  K 3.3* 4.0  CL 106 110  CO2 27 24  GLUCOSE 101* 179*  BUN 10 15  CREATININE 0.57 0.51  CALCIUM 8.1* 8.2*   Liver Function Tests:  Recent Labs Lab 10/17/15 1504 10/18/15 0628  AST 27 24  ALT 24 22  ALKPHOS 66 61  BILITOT 1.0 0.7  PROT 6.8 6.6  ALBUMIN 3.0* 2.8*   No results for input(s): LIPASE, AMYLASE in the last 168 hours. No results for input(s): AMMONIA in the last 168 hours. CBC:  Recent Labs Lab 10/17/15 1504 10/18/15 0628  WBC 7.7 5.7  NEUTROABS 4.5  --   HGB 12.2 11.9*  HCT 36.6 35.3*  MCV 97.3 97.8  PLT 313 340   Cardiac Enzymes: No results for input(s): CKTOTAL, CKMB, CKMBINDEX, TROPONINI in the last 168 hours. BNP: BNP (last 3 results) No results for input(s): BNP in the last 8760 hours.  ProBNP (last 3 results) No results for input(s): PROBNP in the last 8760 hours.  CBG: No results for input(s): GLUCAP in the last 168 hours.   Signed:  Makhiya Coburn K  [facility] 10/20/2015, 5:00 PM

## 2015-10-21 ENCOUNTER — Telehealth: Payer: Self-pay | Admitting: *Deleted

## 2015-10-21 NOTE — Telephone Encounter (Signed)
Transition Care Management Follow-up Telephone Call  Date discharged? 10/20/15   How have you been since you were released from the hospital? Still coughing, slight improvement only   Do you understand why you were in the hospital? yes   Do you understand the discharge instructions? yes   Where were you discharged to? home   Items Reviewed:  Medications reviewed: yes  Allergies reviewed: yes  Dietary changes reviewed: no  Referrals reviewed: none   Functional Questionnaire:   Activities of Daily Living (ADLs):   She states they are independent in the following: ambulation, bathing and hygiene, feeding, continence, grooming, toileting and dressing States they require assistance with the following: none   Any transportation issues/concerns?: no   Any patient concerns? no   Confirmed importance and date/time of follow-up visits scheduled yes, 10/25/15 @ 1100  Provider Appointment booked with Ria Bush, MD  Confirmed with patient if condition begins to worsen call PCP or go to the ER.  Patient was given the office number and encouraged to call back with question or concerns.  : yes

## 2015-10-23 ENCOUNTER — Ambulatory Visit (INDEPENDENT_AMBULATORY_CARE_PROVIDER_SITE_OTHER): Payer: Medicare Other | Admitting: *Deleted

## 2015-10-23 DIAGNOSIS — I4819 Other persistent atrial fibrillation: Secondary | ICD-10-CM

## 2015-10-23 DIAGNOSIS — Z5181 Encounter for therapeutic drug level monitoring: Secondary | ICD-10-CM

## 2015-10-23 DIAGNOSIS — Z7901 Long term (current) use of anticoagulants: Secondary | ICD-10-CM | POA: Diagnosis not present

## 2015-10-23 DIAGNOSIS — I481 Persistent atrial fibrillation: Secondary | ICD-10-CM

## 2015-10-23 DIAGNOSIS — G459 Transient cerebral ischemic attack, unspecified: Secondary | ICD-10-CM

## 2015-10-23 DIAGNOSIS — I4891 Unspecified atrial fibrillation: Secondary | ICD-10-CM

## 2015-10-23 LAB — POCT INR: INR: 1.7

## 2015-10-25 ENCOUNTER — Ambulatory Visit (INDEPENDENT_AMBULATORY_CARE_PROVIDER_SITE_OTHER): Payer: Medicare Other | Admitting: Family Medicine

## 2015-10-25 ENCOUNTER — Encounter: Payer: Self-pay | Admitting: Family Medicine

## 2015-10-25 VITALS — BP 120/70 | HR 68 | Temp 97.5°F | Wt 120.8 lb

## 2015-10-25 DIAGNOSIS — M810 Age-related osteoporosis without current pathological fracture: Secondary | ICD-10-CM | POA: Diagnosis not present

## 2015-10-25 DIAGNOSIS — I4819 Other persistent atrial fibrillation: Secondary | ICD-10-CM

## 2015-10-25 DIAGNOSIS — J441 Chronic obstructive pulmonary disease with (acute) exacerbation: Secondary | ICD-10-CM | POA: Diagnosis not present

## 2015-10-25 DIAGNOSIS — I481 Persistent atrial fibrillation: Secondary | ICD-10-CM

## 2015-10-25 DIAGNOSIS — J449 Chronic obstructive pulmonary disease, unspecified: Secondary | ICD-10-CM

## 2015-10-25 NOTE — Assessment & Plan Note (Signed)
Continues slowed recovery, breathing almost back to baseline, mild fatigue remains. Finish prednisone, doxycycline, discussed albuterol use.

## 2015-10-25 NOTE — Progress Notes (Signed)
Pre visit review using our clinic review tool, if applicable. No additional management support is needed unless otherwise documented below in the visit note. 

## 2015-10-25 NOTE — Patient Instructions (Signed)
Use albuterol as needed for shortness of breath, wheezing or cough. Finish antibiotics and steroid taper. Return in 1 month for spirometry breathing test Good to see you today, call us with questions.

## 2015-10-25 NOTE — Assessment & Plan Note (Signed)
I have asked her to return in 1 month for spirometry to further assess need for daily controller medication.  Infrequent bronchitis  Consider rpt PNA vaccine - last pneumovax 2000.

## 2015-10-25 NOTE — Progress Notes (Signed)
BP 120/70 mmHg  Pulse 68  Temp(Src) 97.5 F (36.4 C)  Wt 120 lb 12.8 oz (54.795 kg)   CC: hosp f/u visit  Subjective:    Patient ID: [name], female    DOB: July 18, 1926, 82 y.o.   MRN: [id number]  HPI: [name] is a 82 y.o. female presenting on 10/25/2015 for Hospitalization Follow-up   Recent hospitalization for COPD exacerbation in setting of chronic atrial fibrillation (on chronic anticoagulant coumadin). She was seen previously by Dr Deborra Medina who started her on doxycycline. She did have hypoxia on presentation. Admitted, treated with mucinex, continued doxycycline to complete 10d course, xopenex neb + atrovent PRN, and IV steroid taper. Discharged with prolonged oral prednisone taper to complete 2.5 week course. Staying fatigued. Has only used albuterol once which helped. Husband was sick prior to pt getting sick.   Ex smoker - quit 2000. Smoked 20 PY. No recent breathing test.  Pending dental work.   Admit date: 10/17/2015 Discharge date: 10/20/2015 F/u phone call: 10/21/2015  Recommendations for Outpatient Follow-up:  1. Follow up with PCP in 2-3 weeks  Discharge Diagnoses:  Active Problems:  Persistent atrial fibrillation (HCC)  Hypothyroidism  COPD exacerbation ([place])  Discharge Condition: Improved Diet recommendation: Heart healthy  Relevant past medical, surgical, family and social history reviewed and updated as indicated. Interim medical history since our last visit reviewed. Allergies and medications reviewed and updated. Current Outpatient Prescriptions on File Prior to Visit  Medication Sig  . acetaminophen (TYLENOL) 500 MG tablet Take 1 tablet (500 mg total) by mouth 3 (three) times daily as needed.  Marland Kitchen albuterol (PROVENTIL HFA;VENTOLIN HFA) 108 (90 Base) MCG/ACT inhaler Inhale 2 puffs into the lungs every 6 (six) hours as needed for wheezing or shortness of breath.  . Calcium-Magnesium-Vitamin D K2505718 MG-MG-UNIT TABS Take 1 tablet by mouth 2  (two) times daily.  . clonazePAM (KLONOPIN) 0.5 MG tablet Take 1 tablet (0.5 mg total) by mouth at bedtime as needed. (Patient taking differently: Take 0.5 mg by mouth at bedtime as needed for anxiety. )  . cromolyn (OPTICROM) 4 % ophthalmic solution Place 1 drop into both eyes 4 (four) times daily as needed.  . doxycycline (VIBRA-TABS) 100 MG tablet Take 1 tablet (100 mg total) by mouth 2 (two) times daily.  . felodipine (PLENDIL) 5 MG 24 hr tablet Take 1 tablet (5 mg total) by mouth daily.  Marland Kitchen glucose blood test strip Use as instructed--FREESTYLE LITE  . ibandronate (BONIVA) 3 MG/3ML SOLN injection Inject 3 mg into the vein every 3 (three) months.   . Lancets (FREESTYLE) lancets E11.9 Use as instructed  . latanoprost (XALATAN) 0.005 % ophthalmic solution Place 1 drop into both eyes at bedtime.   Marland Kitchen levothyroxine (SYNTHROID, LEVOTHROID) 50 MCG tablet TAKE 1 TABLET DAILY  . Magnesium Oxide 500 MG (LAX) TABS Take 1 tablet by mouth 3 (three) times a week.  . Multiple Vitamin (MULTIVITAMIN) capsule Take 1 capsule by mouth every morning.   . Multiple Vitamins-Minerals (ICAPS AREDS FORMULA PO) Take 1 tablet by mouth daily.   . Omega-3 Fatty Acids (FISH OIL CONCENTRATE) 1000 MG CAPS Take 2 capsules by mouth 2 (two) times daily.    . polyethylene glycol (MIRALAX / GLYCOLAX) packet Take 17 g by mouth daily. As needed for constipation  . predniSONE (DELTASONE) 5 MG tablet Take 1 tablet (5 mg total) by mouth daily with breakfast.  . sertraline (ZOLOFT) 50 MG tablet TAKE 1 TABLET (50 MG TOTAL) BY MOUTH  DAILY. **MUST HAVE PHYSICAL FOR FURTHER REFILLS** (Patient taking differently: Take 50 mg by mouth at bedtime. TAKE 1 TABLET (50 MG TOTAL) BY MOUTH DAILY. **MUST HAVE PHYSICAL FOR FURTHER REFILLS**)  . vitamin B-12 (CYANOCOBALAMIN) 500 MCG tablet Take 500 mcg by mouth daily.  Marland Kitchen warfarin (COUMADIN) 2.5 MG tablet Take 2 tablets daily except 1 tablet on Sundays, Tuesdays and Thursdays (Patient taking differently:  Take 2.5-5 mg by mouth at bedtime. Take 2 tablets daily except 1 tablet on Thursday,Saturday,Sunday,Tuesday for 1 week while on antibiotic)  . [DISCONTINUED] Calcium Carbonate-Vitamin D (CALCIUM PLUS VITAMIN D PO) Take 2 tablets by mouth daily.     No current facility-administered medications on file prior to visit.    Review of Systems Per HPI unless specifically indicated in ROS section     Objective:    BP 120/70 mmHg  Pulse 68  Temp(Src) 97.5 F (36.4 C)  Wt 120 lb 12.8 oz (54.795 kg)  Wt Readings from Last 3 Encounters:  10/25/15 120 lb 12.8 oz (54.795 kg)  10/17/15 122 lb (55.339 kg)  10/15/15 124 lb 4 oz (56.359 kg)    Physical Exam  Constitutional: She appears well-developed and well-nourished. No distress.  HENT:  Mouth/Throat: Oropharynx is clear and moist. No oropharyngeal exudate.  Cardiovascular: Normal rate, regular rhythm, normal heart sounds and intact distal pulses.   No murmur heard. Pulmonary/Chest: Effort normal and breath sounds normal. No respiratory distress. She has no wheezes. She has no rales.  Lungs clear  Musculoskeletal: She exhibits no edema.  Skin: Skin is warm and dry. No rash noted.  Psychiatric: She has a normal mood and affect.  Nursing note and vitals reviewed.  Results for orders placed or performed in visit on 10/23/15  POCT INR  Result Value Ref Range   INR 1.7    CHEST 2 VIEW  COMPARISON: Chest x-ray dated 12/07/2013.  FINDINGS: Lungs are hyperexpanded compatible with the given history of COPD. Coarse interstitial lung markings again noted bilaterally suggesting chronic interstitial lung disease. No new confluent airspace opacity to suggest a developing pneumonia. Probable atelectasis and/or chronic pleural thickening at each lung base, left greater than right.  There are multiple wedge compression deformities within the thoracic spine which appear chronic and predominantly similar in degree when compared to an earlier  chest CT of 06/13/2010. Stable levoscoliosis of the thoracic spine. No acute-appearing osseous abnormality.  IMPRESSION: 1. Hyperexpanded lungs indicating COPD/emphysema. Suspect associated chronic bronchitic changes centrally and a diffuse bilateral chronic interstitial lung disease. 2. No acute findings. No evidence of pneumonia. 3. Additional chronic/incidental findings detailed above.   Electronically Signed  By: Franki Cabot M.D.  On: 10/17/2015 15:38    Assessment & Plan:   Problem List Items Addressed This Visit    Persistent atrial fibrillation (HCC)    Chronic, stable. Continue coumadin.      Osteoporosis   COPD (chronic obstructive pulmonary disease) ([place])    I have asked her to return in 1 month for spirometry to further assess need for daily controller medication.  Infrequent bronchitis  Consider rpt PNA vaccine - last pneumovax 2000.      COPD exacerbation ([place]) - Primary    Continues slowed recovery, breathing almost back to baseline, mild fatigue remains. Finish prednisone, doxycycline, discussed albuterol use.          Follow up plan: Return in about 4 weeks (around 11/22/2015), or as needed, for follow up visit.  Ria Bush, MD

## 2015-10-26 NOTE — Assessment & Plan Note (Signed)
Chronic, stable. Continue coumadin 

## 2015-10-30 ENCOUNTER — Ambulatory Visit (INDEPENDENT_AMBULATORY_CARE_PROVIDER_SITE_OTHER): Payer: Medicare Other | Admitting: *Deleted

## 2015-10-30 DIAGNOSIS — Z5181 Encounter for therapeutic drug level monitoring: Secondary | ICD-10-CM | POA: Diagnosis not present

## 2015-10-30 DIAGNOSIS — Z7901 Long term (current) use of anticoagulants: Secondary | ICD-10-CM

## 2015-10-30 DIAGNOSIS — I4891 Unspecified atrial fibrillation: Secondary | ICD-10-CM

## 2015-10-30 DIAGNOSIS — G459 Transient cerebral ischemic attack, unspecified: Secondary | ICD-10-CM | POA: Diagnosis not present

## 2015-10-30 DIAGNOSIS — I481 Persistent atrial fibrillation: Secondary | ICD-10-CM

## 2015-10-30 DIAGNOSIS — I4819 Other persistent atrial fibrillation: Secondary | ICD-10-CM

## 2015-10-30 LAB — POCT INR: INR: 5.3

## 2015-11-04 ENCOUNTER — Telehealth: Payer: Self-pay | Admitting: Family Medicine

## 2015-11-04 MED ORDER — METHOCARBAMOL 500 MG PO TABS: 500.0000 mg | ORAL_TABLET | Freq: Two times a day (BID) | ORAL | Status: DC | PRN

## 2015-11-04 MED ORDER — METHOCARBAMOL 500 MG PO TABS: 250.0000 mg | ORAL_TABLET | Freq: Two times a day (BID) | ORAL | Status: DC | PRN

## 2015-11-04 NOTE — Telephone Encounter (Signed)
4/25 visit should be for spirometry.  Ok to continue tylenol. May try robaxin 500mg  1/2-1 tablet twice daily as needed for muscle spasm - caution it can cause sedation.

## 2015-11-04 NOTE — Telephone Encounter (Signed)
Already sent in

## 2015-11-04 NOTE — Telephone Encounter (Signed)
Spoke to patient. She would like the Robaxin. What quantity do you want  Me to send in?

## 2015-11-04 NOTE — Telephone Encounter (Signed)
Was talking to patient and was disconnected. Tried to call back, but got message

## 2015-11-04 NOTE — Telephone Encounter (Signed)
Spoke to patient.She is having left hip pain and muscle spasms like she had before. The muscle spasms are new. She is taking Tylenol for pain. She wanted to know if there is anything she can do or take before her appointment on the 25th.

## 2015-11-04 NOTE — Telephone Encounter (Signed)
Pt called, scheduled hospital follow up for 04/25 and is taking rx for hip pain.  Pt is requesting cb at [phone]  Thank you

## 2015-11-06 ENCOUNTER — Ambulatory Visit (INDEPENDENT_AMBULATORY_CARE_PROVIDER_SITE_OTHER): Payer: Medicare Other | Admitting: *Deleted

## 2015-11-06 DIAGNOSIS — Z5181 Encounter for therapeutic drug level monitoring: Secondary | ICD-10-CM

## 2015-11-06 DIAGNOSIS — Z7901 Long term (current) use of anticoagulants: Secondary | ICD-10-CM | POA: Diagnosis not present

## 2015-11-06 DIAGNOSIS — G459 Transient cerebral ischemic attack, unspecified: Secondary | ICD-10-CM | POA: Diagnosis not present

## 2015-11-06 DIAGNOSIS — I4891 Unspecified atrial fibrillation: Secondary | ICD-10-CM | POA: Diagnosis not present

## 2015-11-06 DIAGNOSIS — I481 Persistent atrial fibrillation: Secondary | ICD-10-CM

## 2015-11-06 DIAGNOSIS — I4819 Other persistent atrial fibrillation: Secondary | ICD-10-CM

## 2015-11-06 LAB — POCT INR: INR: 3.9

## 2015-11-12 ENCOUNTER — Ambulatory Visit (INDEPENDENT_AMBULATORY_CARE_PROVIDER_SITE_OTHER): Payer: Medicare Other | Admitting: Family Medicine

## 2015-11-12 ENCOUNTER — Ambulatory Visit (INDEPENDENT_AMBULATORY_CARE_PROVIDER_SITE_OTHER)
Admission: RE | Admit: 2015-11-12 | Discharge: 2015-11-12 | Disposition: A | Discharge status: Home / Self Care | Payer: Medicare Other | Source: Ambulatory Visit | Attending: Family Medicine | Admitting: Family Medicine

## 2015-11-12 ENCOUNTER — Encounter: Payer: Self-pay | Admitting: Family Medicine

## 2015-11-12 VITALS — BP 120/80 | HR 79 | Temp 98.0°F | Wt 125.0 lb

## 2015-11-12 DIAGNOSIS — M810 Age-related osteoporosis without current pathological fracture: Secondary | ICD-10-CM

## 2015-11-12 DIAGNOSIS — M5441 Lumbago with sciatica, right side: Secondary | ICD-10-CM

## 2015-11-12 DIAGNOSIS — K5900 Constipation, unspecified: Secondary | ICD-10-CM | POA: Diagnosis not present

## 2015-11-12 DIAGNOSIS — M5136 Other intervertebral disc degeneration, lumbar region: Secondary | ICD-10-CM | POA: Diagnosis not present

## 2015-11-12 DIAGNOSIS — M1611 Unilateral primary osteoarthritis, right hip: Secondary | ICD-10-CM | POA: Diagnosis not present

## 2015-11-12 MED ORDER — TRAMADOL HCL 50 MG PO TABS: 50.0000 mg | ORAL_TABLET | Freq: Two times a day (BID) | ORAL | Status: DC | PRN

## 2015-11-12 NOTE — Assessment & Plan Note (Addendum)
Severe. Currently off bisphosphonate over last 5 months due to planned dental work.

## 2015-11-12 NOTE — Progress Notes (Signed)
Pre visit review using our clinic review tool, if applicable. No additional management support is needed unless otherwise documented below in the visit note. 

## 2015-11-12 NOTE — Progress Notes (Signed)
BP 120/80 mmHg  Pulse 79  Temp(Src) 98 F (36.7 C)  Wt 125 lb (56.7 kg)  SpO2 95%   CC: worsening hip and back pain Subjective:    Patient ID: [name], female    DOB: 1926-01-31, 82 y.o.   MRN: [id number]  HPI: [name] is a 82 y.o. female presenting on 11/12/2015 for Hospitalization Follow-up and Medication Refill   2 wk h/o worsening lower back and bilateral hip pain. Pain worsens at night. Unable to get out of bed without husband's assistance. Endorses pain at R inner groin. Some R leg spasming as well. + bilateral leg weakness present. Unable to get up from sitting position.  Denies inciting trauma/injury.   Denies numbness of legs, fevers/chills, bowel/bladder accidents  Currently taking robaxin 500mg  BID PRN (not very helpful) and tylenol 500mg  TID scheduled.   Known osteoporosis DEXA 2014 (T -3.9 spine, -2.6 hip) H/o pelvic fracture 09/2011 - L pubic ramus fracture.  Longstanding on bisphosphonate (boniva IV Q60mo) but off med for last 5 months 2/2 planned oral surgery.  She does stool daily and takes miralax envelope daily.   Relevant past medical, surgical, family and social history reviewed and updated as indicated. Interim medical history since our last visit reviewed. Allergies and medications reviewed and updated. Current Outpatient Prescriptions on File Prior to Visit  Medication Sig  . acetaminophen (TYLENOL) 500 MG tablet Take 1 tablet (500 mg total) by mouth 3 (three) times daily as needed.  Marland Kitchen albuterol (PROVENTIL HFA;VENTOLIN HFA) 108 (90 Base) MCG/ACT inhaler Inhale 2 puffs into the lungs every 6 (six) hours as needed for wheezing or shortness of breath.  . Calcium-Magnesium-Vitamin D [id number] MG-MG-UNIT TABS Take 1 tablet by mouth 2 (two) times daily.  . clonazePAM (KLONOPIN) 0.5 MG tablet Take 1 tablet (0.5 mg total) by mouth at bedtime as needed. (Patient taking differently: Take 0.5 mg by mouth at bedtime as needed for anxiety. )  .  cromolyn (OPTICROM) 4 % ophthalmic solution Place 1 drop into both eyes 4 (four) times daily as needed.  . felodipine (PLENDIL) 5 MG 24 hr tablet Take 1 tablet (5 mg total) by mouth daily.  Marland Kitchen glucose blood test strip Use as instructed--FREESTYLE LITE  . ibandronate (BONIVA) 3 MG/3ML SOLN injection Inject 3 mg into the vein every 3 (three) months.   . Lancets (FREESTYLE) lancets E11.9 Use as instructed  . latanoprost (XALATAN) 0.005 % ophthalmic solution Place 1 drop into both eyes at bedtime.   Marland Kitchen levothyroxine (SYNTHROID, LEVOTHROID) 50 MCG tablet TAKE 1 TABLET DAILY  . Magnesium Oxide 500 MG (LAX) TABS Take 1 tablet by mouth 3 (three) times a week.  . methocarbamol (ROBAXIN) 500 MG tablet Take 0.5-1 tablets (250-500 mg total) by mouth 2 (two) times daily as needed for muscle spasms.  . Multiple Vitamin (MULTIVITAMIN) capsule Take 1 capsule by mouth every morning.   . Multiple Vitamins-Minerals (ICAPS AREDS FORMULA PO) Take 1 tablet by mouth daily.   . Omega-3 Fatty Acids (FISH OIL CONCENTRATE) 1000 MG CAPS Take 2 capsules by mouth 2 (two) times daily.    . polyethylene glycol (MIRALAX / GLYCOLAX) packet Take 17 g by mouth daily. As needed for constipation  . sertraline (ZOLOFT) 50 MG tablet TAKE 1 TABLET (50 MG TOTAL) BY MOUTH DAILY. **MUST HAVE PHYSICAL FOR FURTHER REFILLS** (Patient taking differently: Take 50 mg by mouth at bedtime. TAKE 1 TABLET (50 MG TOTAL) BY MOUTH DAILY. **MUST HAVE PHYSICAL FOR FURTHER REFILLS**)  .  vitamin B-12 (CYANOCOBALAMIN) 500 MCG tablet Take 500 mcg by mouth daily.  Marland Kitchen warfarin (COUMADIN) 2.5 MG tablet Take 2.5 mg by mouth as directed. Take 2 tablets daily except 1 tablet on Tuesdays and Thursdays  . [DISCONTINUED] Calcium Carbonate-Vitamin D (CALCIUM PLUS VITAMIN D PO) Take 2 tablets by mouth daily.     No current facility-administered medications on file prior to visit.    Review of Systems Per HPI unless specifically indicated in ROS section     Objective:     BP 120/80 mmHg  Pulse 79  Temp(Src) 98 F (36.7 C)  Wt 125 lb (56.7 kg)  SpO2 95%  Wt Readings from Last 3 Encounters:  11/12/15 125 lb (56.7 kg)  10/25/15 120 lb 12.8 oz (54.795 kg)  10/17/15 122 lb (55.339 kg)    Physical Exam  Constitutional: She is oriented to person, place, and time. She appears well-developed and well-nourished. No distress.  Musculoskeletal: She exhibits no edema.  No pain midline spine No paraspinous mm tenderness ++ SLR R>L Pain with int/ext rotation at R hip. Neg FABER. No pain at SIJ, GTB or sciatic notch bilaterally.  No pain to palpation along R femur  Neurological: She is alert and oriented to person, place, and time. She has normal strength. No sensory deficit.  5/5 strength BLE  Skin: Skin is warm and dry. No rash noted.  Psychiatric: She has a normal mood and affect.  Nursing note and vitals reviewed.  Results for orders placed or performed in visit on 11/06/15  POCT INR  Result Value Ref Range   INR 3.9       Assessment & Plan:   Problem List Items Addressed This Visit    Constipation    Compliant with 17gm miralax daily - given marked stool burden on xray and starting tramadol, rec increase miralax to 17mg  BID, hold for diarrhea.      Osteoporosis    Severe. Currently off bisphosphonate over last 5 months due to planned dental work.      Relevant Orders   DG Lumbar Spine Complete   DG HIP UNILAT WITH PELVIS 2-3 VIEWS RIGHT   Pain in lower back - Primary    Initially midline lower back pain, now predominantly located at R pelvis/hip. Given osteoporosis and bisphosphonate use concern for vertebral compression fracture or atypical hip fracture - check xrays today. No hip or pelvic fracture on my read. Marked lumbar DDD/arthritis and ?L5/S1 disease as well - will await radiology evaluation.  Treat pain with tramadol - pt has tolerated well in the past.       Relevant Medications   traMADol (ULTRAM) 50 MG tablet   Other  Relevant Orders   DG Lumbar Spine Complete   DG HIP UNILAT WITH PELVIS 2-3 VIEWS RIGHT       Follow up plan: Return if symptoms worsen or fail to improve.  Ria Bush, MD

## 2015-11-12 NOTE — Assessment & Plan Note (Signed)
Compliant with 17gm miralax daily - given marked stool burden on xray and starting tramadol, rec increase miralax to 17mg  BID, hold for diarrhea.

## 2015-11-12 NOTE — Assessment & Plan Note (Addendum)
Initially midline lower back pain, now predominantly located at R pelvis/hip. Given osteoporosis and bisphosphonate use concern for vertebral compression fracture or atypical hip fracture - check xrays today. No hip or pelvic fracture on my read. Marked lumbar DDD/arthritis and ?L5/S1 disease as well - will await radiology evaluation.  Treat pain with tramadol - pt has tolerated well in the past.

## 2015-11-12 NOTE — Patient Instructions (Addendum)
xrays show lots of arthritis - will await final radiology read. Increase miralax to 1 packet twice daily for 1 week - hold for diarrhea.  For pain - tramadol 50mg  twice daily as needed.  We will be in touch with further recommendations.  Lots of water, lots of rest.

## 2015-11-14 ENCOUNTER — Other Ambulatory Visit: Payer: Self-pay | Admitting: Family Medicine

## 2015-11-15 ENCOUNTER — Other Ambulatory Visit: Payer: Self-pay | Admitting: *Deleted

## 2015-11-15 MED ORDER — POLYETHYLENE GLYCOL 3350 17 G PO PACK: 17.0000 g | PACK | Freq: Every day | ORAL | Status: DC

## 2015-11-17 ENCOUNTER — Encounter (HOSPITAL_COMMUNITY): Payer: Self-pay | Admitting: Cardiology

## 2015-11-17 ENCOUNTER — Emergency Department (HOSPITAL_COMMUNITY): Payer: Medicare Other

## 2015-11-17 ENCOUNTER — Emergency Department (HOSPITAL_COMMUNITY)
Admission: EM | Admit: 2015-11-17 | Discharge: 2015-11-17 | Disposition: A | Discharge status: Home / Self Care | Payer: Medicare Other | Attending: Emergency Medicine | Admitting: Emergency Medicine

## 2015-11-17 DIAGNOSIS — I509 Heart failure, unspecified: Secondary | ICD-10-CM | POA: Diagnosis not present

## 2015-11-17 DIAGNOSIS — I130 Hypertensive heart and chronic kidney disease with heart failure and stage 1 through stage 4 chronic kidney disease, or unspecified chronic kidney disease: Secondary | ICD-10-CM | POA: Diagnosis not present

## 2015-11-17 DIAGNOSIS — I110 Hypertensive heart disease with heart failure: Secondary | ICD-10-CM | POA: Diagnosis not present

## 2015-11-17 DIAGNOSIS — E782 Mixed hyperlipidemia: Secondary | ICD-10-CM | POA: Insufficient documentation

## 2015-11-17 DIAGNOSIS — M810 Age-related osteoporosis without current pathological fracture: Secondary | ICD-10-CM | POA: Diagnosis not present

## 2015-11-17 DIAGNOSIS — Z8673 Personal history of transient ischemic attack (TIA), and cerebral infarction without residual deficits: Secondary | ICD-10-CM | POA: Insufficient documentation

## 2015-11-17 DIAGNOSIS — J984 Other disorders of lung: Secondary | ICD-10-CM | POA: Diagnosis not present

## 2015-11-17 DIAGNOSIS — E039 Hypothyroidism, unspecified: Secondary | ICD-10-CM | POA: Diagnosis not present

## 2015-11-17 DIAGNOSIS — N182 Chronic kidney disease, stage 2 (mild): Secondary | ICD-10-CM | POA: Insufficient documentation

## 2015-11-17 DIAGNOSIS — Z87891 Personal history of nicotine dependence: Secondary | ICD-10-CM | POA: Insufficient documentation

## 2015-11-17 DIAGNOSIS — M1990 Unspecified osteoarthritis, unspecified site: Secondary | ICD-10-CM | POA: Diagnosis not present

## 2015-11-17 DIAGNOSIS — Z79899 Other long term (current) drug therapy: Secondary | ICD-10-CM | POA: Insufficient documentation

## 2015-11-17 DIAGNOSIS — Z7901 Long term (current) use of anticoagulants: Secondary | ICD-10-CM | POA: Diagnosis not present

## 2015-11-17 DIAGNOSIS — E119 Type 2 diabetes mellitus without complications: Secondary | ICD-10-CM | POA: Diagnosis not present

## 2015-11-17 DIAGNOSIS — I4891 Unspecified atrial fibrillation: Secondary | ICD-10-CM | POA: Diagnosis not present

## 2015-11-17 DIAGNOSIS — M7989 Other specified soft tissue disorders: Secondary | ICD-10-CM | POA: Diagnosis present

## 2015-11-17 DIAGNOSIS — J449 Chronic obstructive pulmonary disease, unspecified: Secondary | ICD-10-CM | POA: Diagnosis not present

## 2015-11-17 LAB — CBC
HEMATOCRIT: 32.9 % — AB (ref 36.0–46.0)
HEMOGLOBIN: 10.9 g/dL — AB (ref 12.0–15.0)
MCH: 32.1 pg (ref 26.0–34.0)
MCHC: 33.1 g/dL (ref 30.0–36.0)
MCV: 96.8 fL (ref 78.0–100.0)
Platelets: 420 10*3/uL — ABNORMAL HIGH (ref 150–400)
RBC: 3.4 MIL/uL — AB (ref 3.87–5.11)
RDW: 14.6 % (ref 11.5–15.5)
WBC: 7 10*3/uL (ref 4.0–10.5)

## 2015-11-17 LAB — COMPREHENSIVE METABOLIC PANEL
ALBUMIN: 2.9 g/dL — AB (ref 3.5–5.0)
ALT: 26 U/L (ref 14–54)
ANION GAP: 8 (ref 5–15)
AST: 25 U/L (ref 15–41)
Alkaline Phosphatase: 92 U/L (ref 38–126)
BILIRUBIN TOTAL: 0.8 mg/dL (ref 0.3–1.2)
BUN: 14 mg/dL (ref 6–20)
CO2: 27 mmol/L (ref 22–32)
Calcium: 8.5 mg/dL — ABNORMAL LOW (ref 8.9–10.3)
Chloride: 101 mmol/L (ref 101–111)
Creatinine, Ser: 0.7 mg/dL (ref 0.44–1.00)
Glucose, Bld: 102 mg/dL — ABNORMAL HIGH (ref 65–99)
POTASSIUM: 3.9 mmol/L (ref 3.5–5.1)
Sodium: 136 mmol/L (ref 135–145)
TOTAL PROTEIN: 6.2 g/dL — AB (ref 6.5–8.1)

## 2015-11-17 LAB — BRAIN NATRIURETIC PEPTIDE: B Natriuretic Peptide: 469 pg/mL — ABNORMAL HIGH (ref 0.0–100.0)

## 2015-11-17 MED ORDER — FUROSEMIDE 40 MG PO TABS
40.0000 mg | ORAL_TABLET | Freq: Once | ORAL | Status: AC
  Administered 2015-11-17: 40 mg via ORAL
  Filled 2015-11-17: qty 1

## 2015-11-17 MED ORDER — FUROSEMIDE 20 MG PO TABS: 20.0000 mg | ORAL_TABLET | Freq: Every day | ORAL | Status: DC

## 2015-11-17 NOTE — ED Notes (Signed)
Bilateral feet swelling since last night.  Pt c/o discoloration in feet.

## 2015-11-17 NOTE — ED Notes (Signed)
Positive dp pulses bilaterally.

## 2015-11-17 NOTE — Discharge Instructions (Signed)

## 2015-11-17 NOTE — ED Provider Notes (Signed)
CSN: [id number]     Arrival date & time 11/17/15  1019 History  By signing my name below, I, Doran Stabler, attest that this documentation has been prepared under the direction and in the presence of Valerie Chapel, MD. Electronically Signed: Doran Stabler, ED Scribe. 11/17/2015. 11:09 AM.   Chief Complaint  Patient presents with  . Leg Swelling   The history is provided by the patient. No language interpreter was used.   HPI Comments: [name] is a 82 y.o. female who presents to the Emergency Department with a  PMHx of atrial fibrillation complaining of bilateral leg swelling that began last night. Pt denies taking any Tylenol. Pt denies any fever, chills, CP, SOB, abdominal pain, N/V/D, or any other symptoms at this time. Pt denies any alcohol use.   Sx are persistent, mild, no SOB, nothing makes better.  Pt chronically has problems moving her bowels. She takes Miralax BID.  Electronic Medical Records Reviewed.  Past Medical History  Diagnosis Date  . History of TIA (transient ischemic attack)   . Hypothyroidism   . History of rheumatic fever   . Osteoporosis 04/2013    Thoracic compression fracture, on bisphosphonate and cal/vit D  . Atrial fibrillation (HCC)     Paroxysmal on coumadin  . Mixed hyperlipidemia   . Essential hypertension, benign   . Diabetes type 2, controlled ([place])     Diet controlled  . Pulmonary nodules     Stable bilateral on CT 01/2010, rec rpt yearly for 2 yrs, pt decided to stop f/u  . Aortic stenosis   . Anemia   . Arthritis   . History of skin cancer   . Anxiety   . Diverticulosis   . GERD (gastroesophageal reflux disease)   . Seasonal allergies   . History of pelvic fracture April 2013  . COPD (chronic obstructive pulmonary disease) ([place])   . Chronic gastritis 10/2009    Duodenitis on EGD  . Hiatal hernia 2011    Small  . Collagen vascular disease ([place])   . Glaucoma     Dr. Venetia Maxon  . Dry ARMD     Retinal hemorrhage (09/2013) [place]  .  CKD (chronic kidney disease) stage 2, GFR 60-89 ml/min   . Macular degeneration   . Abnormal drug screen 06/2015    klonopin negative. ?regular use   Past Surgical History  Procedure Laterality Date  . Tonsillectomy    . Cesarean section      (and h/o 6 miscarriages)  . Breast lumpectomy  1983    LEFT, benign  . Patella fracture surgery  05/2006    LEFT surgically repaired with pins and wire  . Dexa  05/2010    T score -4.5 spine, -2.4 femur; does not want to repeat  . Cataract extraction  2012    RIGHT  . Carotid US  2011    mild plaque formation  . Colonoscopy  03/2010    int hemorrhoids, diverticulosis, no need to repeat (Dr. Sydell Axon)  . Esophagogastroduodenoscopy  10/2009    small HH, multiple antric ulcerations s/p biopsy, mild chronic gastritis/duodenitis  . Esophagogastroduodenoscopy  05/26/2012    RMR: small HH, o/w normal.   . Dilation and curettage of uterus      x2  . Laparoscopic cholecystectomy  02/2013    Dr. Geroge Baseman  . Cholecystectomy N/A 02/27/2013    Procedure: LAPAROSCOPIC CHOLECYSTECTOMY;  Surgeon: Donato Heinz, MD;  Location: [facility];  Service: General;  Laterality: N/A;  .  Dexa  04/2013    T score 3.9 [facility] spine, 2.6 hip   Family History  Problem Relation Age of Onset  . Coronary artery disease Mother 22    MI deceased  . Colon cancer Father 81  . Arrhythmia Sister   . Stroke Paternal Grandmother   . Diabetes Neg Hx    Social History  Substance Use Topics  . Smoking status: Former Smoker -- 1.00 packs/day for 20 years    Types: Cigarettes    Quit date: 10/13/1998  . Smokeless tobacco: Never Used  . Alcohol Use: No   OB History    No data available     Review of Systems  Constitutional: Negative for fever and chills.  Respiratory: Negative for shortness of breath.   Cardiovascular: Positive for leg swelling. Negative for chest pain.  Gastrointestinal: Negative for nausea, vomiting, abdominal pain and diarrhea.  All other systems reviewed and  are negative.   Allergies  Propoxyphene n-acetaminophen; Codeine; Meperidine hcl; Morphine; and Shellfish allergy  Home Medications   Prior to Admission medications   Medication Sig Start Date End Date Taking? Authorizing Provider  acetaminophen (TYLENOL) 500 MG tablet Take 1 tablet (500 mg total) by mouth 3 (three) times daily as needed. 07/04/15  Yes Ria Bush, MD  albuterol (PROVENTIL HFA;VENTOLIN HFA) 108 (90 Base) MCG/ACT inhaler Inhale 2 puffs into the lungs every 6 (six) hours as needed for wheezing or shortness of breath. 10/20/15  Yes Donne Hazel, MD  Calcium-Magnesium-Vitamin D [phone] MG-MG-UNIT TABS Take 1 tablet by mouth 2 (two) times daily.   Yes Historical Provider, MD  clonazePAM (KLONOPIN) 0.5 MG tablet Take 1 tablet (0.5 mg total) by mouth at bedtime as needed. Patient taking differently: Take 0.5 mg by mouth at bedtime as needed for anxiety.  06/07/15  Yes Ria Bush, MD  cromolyn (OPTICROM) 4 % ophthalmic solution Place 1 drop into both eyes 4 (four) times daily as needed. 09/17/15  Yes Historical Provider, MD  felodipine (PLENDIL) 5 MG 24 hr tablet Take 1 tablet (5 mg total) by mouth daily. 12/10/14  Yes Lendon Colonel, NP  ibandronate (BONIVA) 3 MG/3ML SOLN injection Inject 3 mg into the vein every 3 (three) months.  04/05/12  Yes Ria Bush, MD  latanoprost (XALATAN) 0.005 % ophthalmic solution Place 1 drop into both eyes at bedtime.    Yes Historical Provider, MD  levothyroxine (SYNTHROID, LEVOTHROID) 50 MCG tablet TAKE 1 TABLET DAILY 06/24/15  Yes Ria Bush, MD  Magnesium Oxide 500 MG (LAX) TABS Take 1 tablet by mouth 3 (three) times a week.   Yes Historical Provider, MD  methocarbamol (ROBAXIN) 500 MG tablet Take 0.5-1 tablets (250-500 mg total) by mouth 2 (two) times daily as needed for muscle spasms. 11/04/15  Yes Ria Bush, MD  Multiple Vitamin (MULTIVITAMIN) capsule Take 1 capsule by mouth every morning.    Yes Historical  Provider, MD  Multiple Vitamins-Minerals (ICAPS AREDS FORMULA PO) Take 1 tablet by mouth daily.    Yes Historical Provider, MD  Omega-3 Fatty Acids (FISH OIL CONCENTRATE) 1000 MG CAPS Take 2 capsules by mouth 2 (two) times daily.     Yes Historical Provider, MD  polyethylene glycol (MIRALAX / GLYCOLAX) packet Take 17 g by mouth daily. 11/15/15  Yes Ria Bush, MD  sertraline (ZOLOFT) 50 MG tablet TAKE 1 TABLET (50 MG TOTAL) BY MOUTH DAILY. **MUST HAVE PHYSICAL FOR FURTHER REFILLS** Patient taking differently: Take 50 mg by mouth at bedtime. TAKE 1 TABLET (50  MG TOTAL) BY MOUTH DAILY. **MUST HAVE PHYSICAL FOR FURTHER REFILLS** 06/06/15  Yes Ria Bush, MD  traMADol (ULTRAM) 50 MG tablet Take 1 tablet (50 mg total) by mouth 3 (three) times daily as needed. 11/14/15  Yes Ria Bush, MD  vitamin B-12 (CYANOCOBALAMIN) 500 MCG tablet Take 500 mcg by mouth daily.   Yes Historical Provider, MD  warfarin (COUMADIN) 2.5 MG tablet Take 2.5 mg by mouth as directed. Take 2 tablets daily except 1 tablet on Tuesdays, Thursdays, and Saturdays   Yes Historical Provider, MD  furosemide (LASIX) 20 MG tablet Take 1 tablet (20 mg total) by mouth daily. 11/17/15   Valerie Chapel, MD   BP 110/65 mmHg  Pulse 81  Temp(Src) 98.3 F (36.8 C) (Oral)  Resp 18  SpO2 94%   Physical Exam  Constitutional: She appears well-developed and well-nourished. No distress.  HENT:  Head: Normocephalic and atraumatic.  Mouth/Throat: Oropharynx is clear and moist. No oropharyngeal exudate.  Eyes: Conjunctivae and EOM are normal. Pupils are equal, round, and reactive to light. Right eye exhibits no discharge. Left eye exhibits no discharge. No scleral icterus.  Neck: Normal range of motion. Neck supple. No JVD present. No thyromegaly present.  Cardiovascular: Normal rate, normal heart sounds and intact distal pulses.  An irregularly irregular rhythm present. Exam reveals no gallop and no friction rub.   No murmur  heard. Pulmonary/Chest: Effort normal and breath sounds normal. No respiratory distress. She has no wheezes. She has no rales.  Abdominal: Soft. Bowel sounds are normal. She exhibits no distension and no mass. There is no tenderness.  Musculoskeletal: Normal range of motion. She exhibits no edema or tenderness.  symmetrical 1+ pitting pedal edema   Lymphadenopathy:    She has no cervical adenopathy.  Neurological: She is alert. Coordination normal.  Skin: Skin is warm and dry. No rash noted. No erythema.  Psychiatric: She has a normal mood and affect. Her behavior is normal.  Nursing note and vitals reviewed.   ED Course  Procedures  DIAGNOSTIC STUDIES: Oxygen Saturation is 95% on room air, normal by my interpretation.    COORDINATION OF CARE: 11:03 AM Will give lasix. Will order blood work. Discussed treatment plan with pt at bedside and pt agreed to plan.  Labs Review Labs Reviewed  BRAIN NATRIURETIC PEPTIDE - Abnormal; Notable for the following:    B Natriuretic Peptide 469.0 (*)    All other components within normal limits  COMPREHENSIVE METABOLIC PANEL - Abnormal; Notable for the following:    Glucose, Bld 102 (*)    Calcium 8.5 (*)    Total Protein 6.2 (*)    Albumin 2.9 (*)    All other components within normal limits  CBC - Abnormal; Notable for the following:    RBC 3.40 (*)    Hemoglobin 10.9 (*)    HCT 32.9 (*)    Platelets 420 (*)    All other components within normal limits    Imaging Review Dg Chest 2 View  11/17/2015  CLINICAL DATA:  Bilateral lower extremity swelling which started last night. History of atrial fibrillation. EXAM: CHEST  2 VIEW COMPARISON:  10/17/2015 FINDINGS: Mild cardiac enlargement. The lungs are hyperinflated. Coarsened interstitial markings are noted bilaterally. Small left pleural effusion and mild interstitial edema is identified. IMPRESSION: 1. Mild CHF. 2. Chronic lung disease Electronically Signed   By: Kerby Moors M.D.   On:  11/17/2015 12:47   I have personally reviewed and evaluated these images and lab  results as part of my medical decision-making.   EKG Interpretation   Date/Time:  Sunday November 17 2015 12:15:16 EDT Ventricular Rate:  79 PR Interval:    QRS Duration: 83 QT Interval:  378 QTC Calculation: 433 R Axis:   16 Text Interpretation:  Atrial fibrillation Low voltage, precordial leads  Anteroseptal infarct, age indeterminate Poor R wave progression  Nonspecific T wave abnormality Since last tracing rate slower Confirmed by  Alixis Harmon  MD, Naseem Adler ([id number]) on 11/17/2015 1:07:10 PM      MDM   Final diagnoses:  Congestive heart failure, unspecified congestive heart failure chronicity, unspecified congestive heart failure type ([place])   The pt already sees cards for afib Needs to call for echo in AM Stable but has mild CHF likely given elevated BNP and CXR Pt informed Agreeable to d/c.  Can call in AM - stable, no hypoxia - RA sat of 98 for me.  Meds given in ED:  Medications  furosemide (LASIX) tablet 40 mg (40 mg Oral Given 11/17/15 1136)    New Prescriptions   FUROSEMIDE (LASIX) 20 MG TABLET    Take 1 tablet (20 mg total) by mouth daily.       Valerie Chapel, MD 11/17/15 1341

## 2015-11-18 ENCOUNTER — Telehealth: Payer: Self-pay

## 2015-11-18 ENCOUNTER — Other Ambulatory Visit: Payer: Self-pay | Admitting: Family Medicine

## 2015-11-18 NOTE — Telephone Encounter (Signed)
PLEASE NOTE: All timestamps contained within this report are represented as [place] Standard Time. CONFIDENTIALTY NOTICE: This fax transmission is intended only for the addressee. It contains information that is legally privileged, confidential or otherwise protected from use or disclosure. If you are not the intended recipient, you are strictly prohibited from reviewing, disclosing, copying using or disseminating any of this information or taking any action in reliance on or regarding this information. If you have received this fax in error, please notify us immediately by telephone so that we can arrange for its return to [place]. Phone: [phone], Toll-Free: [phone], Fax: [phone] Page: 1 of 2 Call Id: [id number] [place] Patient Name: [name] Gender: Female DOB: 08/25/1925 Age: 82 Y 2 M 14 D Return Phone Number: [phone] (Primary) Address: City/State/Zip: [place] Client [place] [facility] - Client Client Site [place] Physician Ria Bush - MD Contact Type Call Who Is Calling Patient / Member / Family / Caregiver Call Type Triage / Clinical Relationship To Patient Self Return Phone Number [phone] (Primary) Chief Complaint Leg Swelling And Edema Reason for Call Symptomatic / Request for [place] she is having pain in her hip, is having a hard time walking, feet are swollen [facility]. PreDisposition Go to Urgent Care/Walk-In Clinic Translation No Nurse Assessment Nurse: Einar Gip, RN, Neoma Laming Date/Time (Eastern Time): 11/17/2015 8:55:23 AM Confirm and document reason for call. If symptomatic, describe symptoms. You must click the next button to save text entered. ---Caller states she has been seeing the doctor about pain in her hips. States  last night her feet were swollen. States swelling has gone down some during the night. One is more swollen than the other. Swelling goes to the ankles. Has the patient traveled out of the country within the last 30 days? ---No Does the patient have any new or worsening symptoms? ---Yes Will a triage be completed? ---Yes Related visit to physician within the last 2 weeks? ---Yes Does the PT have any chronic conditions? (i.e. diabetes, asthma, etc.) ---Yes List chronic conditions. ---atrial fib, thyroid disease, hypertension, Is this a behavioral health or substance abuse call? ---No Guidelines Guideline Title Affirmed Question Affirmed Notes Nurse Date/Time [name] Time) Leg Swelling and Edema [1] Thigh, calf, or ankle swelling AND [2] bilateral AND [3] 1 side is more swollen Einar Gip, RN, Neoma Laming 11/17/2015 8:59:15 AM Disp. Time [name] Time) Disposition Final User PLEASE NOTE: All timestamps contained within this report are represented as [place] Standard Time. CONFIDENTIALTY NOTICE: This fax transmission is intended only for the addressee. It contains information that is legally privileged, confidential or otherwise protected from use or disclosure. If you are not the intended recipient, you are strictly prohibited from reviewing, disclosing, copying using or disseminating any of this information or taking any action in reliance on or regarding this information. If you have received this fax in error, please notify us immediately by telephone so that we can arrange for its return to [place]. Phone: [phone], Toll-Free: [phone], Fax: [phone] Page: 2 of 2 Call Id: [id number] 11/17/2015 9:02:10 AM See Physician within 4 Hours (or PCP triage) Yes Einar Gip, RN, Garrel Ridgel Understands: Yes Disagree/Comply: Comply Care Advice Given Per Guideline SEE PHYSICIAN WITHIN 4 HOURS (or PCP triage): CALL EMS IF: Chest pain or shortness of breath occurs. CARE ADVICE given per Leg Swelling  and Edema (Adult)  guideline. Referrals GO TO FACILITY OTHER - SPECIFY

## 2015-11-18 NOTE — Telephone Encounter (Signed)
Pt requesting refill, medicare wellness visit 08/08/15, last filled on 06/06/15 #90, 1 refill. Ok to refill?

## 2015-11-18 NOTE — Telephone Encounter (Signed)
Per chart review tab pt seen at [facility] ED on 11/17/15.

## 2015-11-20 ENCOUNTER — Ambulatory Visit (INDEPENDENT_AMBULATORY_CARE_PROVIDER_SITE_OTHER): Payer: Medicare Other | Admitting: *Deleted

## 2015-11-20 DIAGNOSIS — H40013 Open angle with borderline findings, low risk, bilateral: Secondary | ICD-10-CM | POA: Diagnosis not present

## 2015-11-20 DIAGNOSIS — G459 Transient cerebral ischemic attack, unspecified: Secondary | ICD-10-CM | POA: Diagnosis not present

## 2015-11-20 DIAGNOSIS — Z7901 Long term (current) use of anticoagulants: Secondary | ICD-10-CM

## 2015-11-20 DIAGNOSIS — Z961 Presence of intraocular lens: Secondary | ICD-10-CM | POA: Diagnosis not present

## 2015-11-20 DIAGNOSIS — Z5181 Encounter for therapeutic drug level monitoring: Secondary | ICD-10-CM

## 2015-11-20 DIAGNOSIS — H353132 Nonexudative age-related macular degeneration, bilateral, intermediate dry stage: Secondary | ICD-10-CM | POA: Diagnosis not present

## 2015-11-20 DIAGNOSIS — I4891 Unspecified atrial fibrillation: Secondary | ICD-10-CM | POA: Diagnosis not present

## 2015-11-20 DIAGNOSIS — I481 Persistent atrial fibrillation: Secondary | ICD-10-CM

## 2015-11-20 DIAGNOSIS — H2589 Other age-related cataract: Secondary | ICD-10-CM | POA: Diagnosis not present

## 2015-11-20 DIAGNOSIS — I4819 Other persistent atrial fibrillation: Secondary | ICD-10-CM

## 2015-11-20 DIAGNOSIS — H2512 Age-related nuclear cataract, left eye: Secondary | ICD-10-CM | POA: Diagnosis not present

## 2015-11-20 LAB — POCT INR: INR: 5.4

## 2015-11-22 ENCOUNTER — Ambulatory Visit (INDEPENDENT_AMBULATORY_CARE_PROVIDER_SITE_OTHER): Payer: Medicare Other | Admitting: Family Medicine

## 2015-11-22 ENCOUNTER — Encounter: Payer: Self-pay | Admitting: Family Medicine

## 2015-11-22 VITALS — BP 118/66 | HR 94 | Temp 98.1°F | Wt 122.5 lb

## 2015-11-22 DIAGNOSIS — I10 Essential (primary) hypertension: Secondary | ICD-10-CM

## 2015-11-22 DIAGNOSIS — I509 Heart failure, unspecified: Secondary | ICD-10-CM | POA: Diagnosis not present

## 2015-11-22 DIAGNOSIS — R791 Abnormal coagulation profile: Secondary | ICD-10-CM

## 2015-11-22 DIAGNOSIS — J449 Chronic obstructive pulmonary disease, unspecified: Secondary | ICD-10-CM

## 2015-11-22 DIAGNOSIS — I50810 Right heart failure, unspecified: Secondary | ICD-10-CM | POA: Insufficient documentation

## 2015-11-22 DIAGNOSIS — J984 Other disorders of lung: Secondary | ICD-10-CM

## 2015-11-22 LAB — CREATININE, SERUM: Creatinine, Ser: 0.58 mg/dL (ref 0.40–1.20)

## 2015-11-22 LAB — POTASSIUM: POTASSIUM: 3.8 meq/L (ref 3.5–5.1)

## 2015-11-22 MED ORDER — FUROSEMIDE 20 MG PO TABS: 10.0000 mg | ORAL_TABLET | Freq: Every day | ORAL | Status: DC | PRN

## 2015-11-22 MED ORDER — FELODIPINE ER 2.5 MG PO TB24: 2.5000 mg | ORAL_TABLET | Freq: Every day | ORAL | Status: DC

## 2015-11-22 NOTE — Assessment & Plan Note (Signed)
Anticipate from more regular use of tramadol recently - pt has since discontinued tramadol.

## 2015-11-22 NOTE — Progress Notes (Signed)
Pre visit review using our clinic review tool, if applicable. No additional management support is needed unless otherwise documented below in the visit note. 

## 2015-11-22 NOTE — Assessment & Plan Note (Signed)
Anticipate by exam today and recent xray findings she has chronic interstitial lung disease +/- COPD. Pending spirometry later this month.

## 2015-11-22 NOTE — Progress Notes (Signed)
BP 118/66 mmHg  Pulse 94  Temp(Src) 98.1 F (36.7 C) (Oral)  Wt 122 lb 8 oz (55.566 kg)  SpO2 96%   CC: hosp f/u visit  Subjective:    Patient ID: [name], female    DOB: [birth year], 82 y.o.   MRN: [id number]  HPI: [name] is a 82 y.o. female presenting on 11/22/2015 for Hospitalization Follow-up   Seen here 11/12/2015 with R sided back pain with sciatica - xrays stable. See prior note for details. Treated with tramadol scheduled.   Seen at [facility] ER on 11/17/2015 with worsening new bilateral foot swelling - BNP 469. Cr stable. Hgb 10.9. CXR showing mild CHF. Treated with lasix x1, advised to contact cards for echocardiogram. Lasix 20mg  daily started. No known h/o CHF. Lasix has been very effective. Ongoing leg swelling. She has cardiology appt and echocardiogram scheduled for 11/27/2015.   Recent INR 5.4 (11/20/2015), s/p adjustment by coumadin clinic. She has recently increased scheduled tramadol.   Relevant past medical, surgical, family and social history reviewed and updated as indicated. Interim medical history since our last visit reviewed. Allergies and medications reviewed and updated. Current Outpatient Prescriptions on File Prior to Visit  Medication Sig  . acetaminophen (TYLENOL) 500 MG tablet Take 1 tablet (500 mg total) by mouth 3 (three) times daily as needed.  Marland Kitchen albuterol (PROVENTIL HFA;VENTOLIN HFA) 108 (90 Base) MCG/ACT inhaler Inhale 2 puffs into the lungs every 6 (six) hours as needed for wheezing or shortness of breath.  . Calcium-Magnesium-Vitamin D [id number] MG-MG-UNIT TABS Take 1 tablet by mouth 2 (two) times daily.  . clonazePAM (KLONOPIN) 0.5 MG tablet Take 1 tablet (0.5 mg total) by mouth at bedtime as needed. (Patient taking differently: Take 0.5 mg by mouth at bedtime as needed for anxiety. )  . cromolyn (OPTICROM) 4 % ophthalmic solution Place 1 drop into both eyes 4 (four) times daily as needed.  . ibandronate (BONIVA) 3 MG/3ML SOLN injection  Inject 3 mg into the vein every 3 (three) months.   . latanoprost (XALATAN) 0.005 % ophthalmic solution Place 1 drop into both eyes at bedtime.   Marland Kitchen levothyroxine (SYNTHROID, LEVOTHROID) 50 MCG tablet TAKE 1 TABLET DAILY  . Magnesium Oxide 500 MG (LAX) TABS Take 1 tablet by mouth 3 (three) times a week.  . methocarbamol (ROBAXIN) 500 MG tablet Take 0.5-1 tablets (250-500 mg total) by mouth 2 (two) times daily as needed for muscle spasms.  . Multiple Vitamin (MULTIVITAMIN) capsule Take 1 capsule by mouth every morning.   . Multiple Vitamins-Minerals (ICAPS AREDS FORMULA PO) Take 1 tablet by mouth daily.   . Omega-3 Fatty Acids (FISH OIL CONCENTRATE) 1000 MG CAPS Take 2 capsules by mouth 2 (two) times daily.    . polyethylene glycol (MIRALAX / GLYCOLAX) packet Take 17 g by mouth daily.  . sertraline (ZOLOFT) 50 MG tablet Take 1 tablet (50 mg total) by mouth daily.  . vitamin B-12 (CYANOCOBALAMIN) 500 MCG tablet Take 500 mcg by mouth daily.  Marland Kitchen warfarin (COUMADIN) 2.5 MG tablet Take 2.5 mg by mouth daily.   . [DISCONTINUED] Calcium Carbonate-Vitamin D (CALCIUM PLUS VITAMIN D PO) Take 2 tablets by mouth daily.     No current facility-administered medications on file prior to visit.    Review of Systems Per HPI unless specifically indicated in ROS section     Objective:    BP 118/66 mmHg  Pulse 94  Temp(Src) 98.1 F (36.7 C) (Oral)  Wt 122 lb 8  oz (55.566 kg)  SpO2 96%  Wt Readings from Last 3 Encounters:  11/22/15 122 lb 8 oz (55.566 kg)  11/12/15 125 lb (56.7 kg)  10/25/15 120 lb 12.8 oz (54.795 kg)    Physical Exam  Constitutional: She appears well-developed and well-nourished. No distress.  Neck: Normal range of motion. Neck supple. JVD present.  Cardiovascular: Normal rate, regular rhythm, normal heart sounds and intact distal pulses.   No murmur heard. Pulmonary/Chest: Effort normal and breath sounds normal. No respiratory distress. She has no wheezes. She has no rales.  Coarse  crackles bibasilarly  Musculoskeletal: She exhibits no edema.  Stiff with position changes. No significant edema. No pain to palpation at GTB, SIJ, or sciatic notch  Skin: Skin is warm and dry. No rash noted.  Psychiatric: She has a normal mood and affect.  Vitals reviewed.  Results for orders placed or performed in visit on 11/20/15  POCT INR  Result Value Ref Range   INR 5.4    Lab Results  Component Value Date   CREATININE 0.70 11/17/2015       Assessment & Plan:   Problem List Items Addressed This Visit    Essential hypertension, benign    Possible contribution of felodipine to pedal edema - will decrease to 2.5mg  ER daily.      Relevant Medications   felodipine (PLENDIL) 2.5 MG 24 hr tablet   furosemide (LASIX) 20 MG tablet   Chronic nonspecific lung disease ([place])    Anticipate by exam today and recent xray findings she has chronic interstitial lung disease +/- COPD. Pending spirometry later this month.       Acute congestive heart failure (HCC) - Primary    Elevated BNP, pedal edema, JVD. Improvement noted with 1wk lasix 20mg  course.  Suggested she keep 10mg  lasix on hand PRN recurrent edema. Check Cr and K today. Pending echo and cards f/u next week.      Relevant Medications   felodipine (PLENDIL) 2.5 MG 24 hr tablet   furosemide (LASIX) 20 MG tablet   Other Relevant Orders   Potassium   Creatinine, serum   Supratherapeutic INR    Anticipate from more regular use of tramadol recently - pt has since discontinued tramadol.           Follow up plan: Return if symptoms worsen or fail to improve.  Ria Bush, MD

## 2015-11-22 NOTE — Assessment & Plan Note (Signed)
Possible contribution of felodipine to pedal edema - will decrease to 2.5mg  ER daily.

## 2015-11-22 NOTE — Patient Instructions (Addendum)
Blood work today Lohman to stop tramadol.  May take lasix 20mg  1/2 tablet as needed for leg swelling.  Decrease felodipine to 2.5mg  daily. Keep appointment for ultrasound and cardiology appointment next week.

## 2015-11-22 NOTE — Assessment & Plan Note (Signed)
Elevated BNP, pedal edema, JVD. Improvement noted with 1wk lasix 20mg  course.  Suggested she keep 10mg  lasix on hand PRN recurrent edema. Check Cr and K today. Pending echo and cards f/u next week.

## 2015-11-26 ENCOUNTER — Telehealth: Payer: Self-pay | Admitting: Cardiology

## 2015-11-26 NOTE — Telephone Encounter (Signed)
When pt was seen in the ER they wanted her to have an Echo in 2 wks per AVS, she called wanting to know if she would need the Echo prior to her apt tomorrow

## 2015-11-26 NOTE — Telephone Encounter (Signed)
Called pt and told her to come to her appt. With Dr. Domenic Polite and if he still wants to order an echo we will do it at that appt.

## 2015-11-27 ENCOUNTER — Ambulatory Visit (INDEPENDENT_AMBULATORY_CARE_PROVIDER_SITE_OTHER): Payer: Medicare Other | Admitting: Cardiology

## 2015-11-27 ENCOUNTER — Ambulatory Visit (INDEPENDENT_AMBULATORY_CARE_PROVIDER_SITE_OTHER): Payer: Medicare Other | Admitting: *Deleted

## 2015-11-27 ENCOUNTER — Encounter: Payer: Self-pay | Admitting: Cardiology

## 2015-11-27 VITALS — BP 114/72 | HR 83 | Ht 66.0 in | Wt 122.0 lb

## 2015-11-27 DIAGNOSIS — G459 Transient cerebral ischemic attack, unspecified: Secondary | ICD-10-CM | POA: Diagnosis not present

## 2015-11-27 DIAGNOSIS — I5032 Chronic diastolic (congestive) heart failure: Secondary | ICD-10-CM | POA: Diagnosis not present

## 2015-11-27 DIAGNOSIS — I482 Chronic atrial fibrillation, unspecified: Secondary | ICD-10-CM

## 2015-11-27 DIAGNOSIS — I4819 Other persistent atrial fibrillation: Secondary | ICD-10-CM

## 2015-11-27 DIAGNOSIS — I4891 Unspecified atrial fibrillation: Secondary | ICD-10-CM | POA: Diagnosis not present

## 2015-11-27 DIAGNOSIS — I481 Persistent atrial fibrillation: Secondary | ICD-10-CM

## 2015-11-27 DIAGNOSIS — Z7901 Long term (current) use of anticoagulants: Secondary | ICD-10-CM

## 2015-11-27 DIAGNOSIS — Z8673 Personal history of transient ischemic attack (TIA), and cerebral infarction without residual deficits: Secondary | ICD-10-CM

## 2015-11-27 DIAGNOSIS — I10 Essential (primary) hypertension: Secondary | ICD-10-CM | POA: Diagnosis not present

## 2015-11-27 DIAGNOSIS — Z5181 Encounter for therapeutic drug level monitoring: Secondary | ICD-10-CM

## 2015-11-27 LAB — POCT INR: INR: 1.6

## 2015-11-27 NOTE — Patient Instructions (Signed)

## 2015-11-27 NOTE — Progress Notes (Signed)
Cardiology Office Note  Date: 11/27/2015   ID: [name], DOB 01-Jan-1926, MRN [id number]  PCP: Ria Bush, MD  Primary Cardiologist: Rozann Lesches, MD   Chief Complaint  Patient presents with  . Atrial Fibrillation  . Hospitalization Follow-up    History of Present Illness: Valerie KELLOG is an 82 y.o. female that I have not seen in the office since May 2015. Her most recent visit was with Ms. Lawrence NP in May 2016. Records indicate ER visit in late April with leg edema. BNP level was 469 and her chest x-ray showed mild interstitial edema with a small left pleural effusion suggestive of congestive heart failure. She has not had a recent echocardiogram.  She states that the leg edema occurred over a weekend, she has done well since using low-dose Lasix, now on an as-needed basis. She does not report any unusual sodium intake or increased fluid intake. She does seem to have slowed down since I last saw her, no longer traveling to [place] with her husband. She has had a lot of discomfort from arthritis.  She is on Coumadin with follow-up in the anticoagulation clinic. No reported falls or bleeding problems.  Past Medical History  Diagnosis Date  . History of TIA (transient ischemic attack)   . Hypothyroidism   . History of rheumatic fever   . Osteoporosis 04/2013    Thoracic compression fracture, on bisphosphonate and cal/vit D  . Paroxysmal atrial fibrillation (HCC)   . Mixed hyperlipidemia   . Essential hypertension, benign   . Diabetes type 2, controlled ([place])     Diet controlled  . Pulmonary nodules     Stable bilateral on CT 01/2010, rec rpt yearly for 2 yrs, pt decided to stop f/u  . Aortic sclerosis ([place])   . Anemia   . Arthritis   . History of skin cancer   . Anxiety   . Diverticulosis   . GERD (gastroesophageal reflux disease)   . Seasonal allergies   . History of pelvic fracture April 2013  . COPD (chronic obstructive pulmonary disease)  ([place])   . Chronic gastritis 10/2009    Duodenitis on EGD  . Hiatal hernia 2011    Small  . Collagen vascular disease ([place])   . Glaucoma     Dr. Venetia Maxon  . Dry ARMD     Retinal hemorrhage (09/2013) [place]  . CKD (chronic kidney disease) stage 2, GFR 60-89 ml/min   . Macular degeneration   . Abnormal drug screen 06/2015    Past Surgical History  Procedure Laterality Date  . Tonsillectomy    . Cesarean section      (and h/o 6 miscarriages)  . Breast lumpectomy  1983    LEFT, benign  . Patella fracture surgery  05/2006    LEFT surgically repaired with pins and wire  . Dexa  05/2010    T score -4.5 spine, -2.4 femur; does not want to repeat  . Cataract extraction  2012    RIGHT  . Carotid US  2011    mild plaque formation  . Colonoscopy  03/2010    int hemorrhoids, diverticulosis, no need to repeat (Dr. Sydell Axon)  . Esophagogastroduodenoscopy  10/2009    small HH, multiple antric ulcerations s/p biopsy, mild chronic gastritis/duodenitis  . Esophagogastroduodenoscopy  05/26/2012    RMR: small HH, o/w normal.   . Dilation and curettage of uterus      x2  . Laparoscopic cholecystectomy  02/2013  Dr. Geroge Baseman  . Cholecystectomy N/A 02/27/2013    Procedure: LAPAROSCOPIC CHOLECYSTECTOMY;  Surgeon: Donato Heinz, MD;  Location: [facility];  Service: General;  Laterality: N/A;  . Dexa  04/2013    T score 3.9 [facility] spine, 2.6 hip    Current Outpatient Prescriptions  Medication Sig Dispense Refill  . acetaminophen (TYLENOL) 500 MG tablet Take 1 tablet (500 mg total) by mouth 3 (three) times daily as needed.    Marland Kitchen albuterol (PROVENTIL HFA;VENTOLIN HFA) 108 (90 Base) MCG/ACT inhaler Inhale 2 puffs into the lungs every 6 (six) hours as needed for wheezing or shortness of breath. 1 Inhaler 0  . Calcium-Magnesium-Vitamin D [id number] MG-MG-UNIT TABS Take 1 tablet by mouth 2 (two) times daily.    . clonazePAM (KLONOPIN) 0.5 MG tablet Take 1 tablet (0.5 mg total) by mouth at bedtime as needed.  (Patient taking differently: Take 0.5 mg by mouth at bedtime as needed for anxiety. ) 90 tablet 0  . cromolyn (OPTICROM) 4 % ophthalmic solution Place 1 drop into both eyes 4 (four) times daily as needed.  0  . felodipine (PLENDIL) 2.5 MG 24 hr tablet Take 1 tablet (2.5 mg total) by mouth daily. 30 tablet 6  . furosemide (LASIX) 20 MG tablet Take 0.5 tablets (10 mg total) by mouth daily as needed for edema. 30 tablet 0  . ibandronate (BONIVA) 3 MG/3ML SOLN injection Inject 3 mg into the vein every 3 (three) months.  3.6 mL   . latanoprost (XALATAN) 0.005 % ophthalmic solution Place 1 drop into both eyes at bedtime.     Marland Kitchen levothyroxine (SYNTHROID, LEVOTHROID) 50 MCG tablet TAKE 1 TABLET DAILY 90 tablet 3  . Magnesium Oxide 500 MG (LAX) TABS Take 1 tablet by mouth 3 (three) times a week.    . methocarbamol (ROBAXIN) 500 MG tablet Take 0.5-1 tablets (250-500 mg total) by mouth 2 (two) times daily as needed for muscle spasms. 30 tablet 0  . Multiple Vitamin (MULTIVITAMIN) capsule Take 1 capsule by mouth every morning.     . Multiple Vitamins-Minerals (ICAPS AREDS FORMULA PO) Take 1 tablet by mouth daily.     . Omega-3 Fatty Acids (FISH OIL CONCENTRATE) 1000 MG CAPS Take 2 capsules by mouth 2 (two) times daily.      . polyethylene glycol (MIRALAX / GLYCOLAX) packet Take 17 g by mouth daily. 30 each 3  . sertraline (ZOLOFT) 50 MG tablet Take 1 tablet (50 mg total) by mouth daily. 90 tablet 0  . vitamin B-12 (CYANOCOBALAMIN) 500 MCG tablet Take 500 mcg by mouth daily.    Marland Kitchen warfarin (COUMADIN) 2.5 MG tablet Take 2.5 mg by mouth daily.     . [DISCONTINUED] Calcium Carbonate-Vitamin D (CALCIUM PLUS VITAMIN D PO) Take 2 tablets by mouth daily.       No current facility-administered medications for this visit.   Allergies:  Propoxyphene n-acetaminophen; Codeine; Meperidine hcl; Morphine; and Shellfish allergy   Social History: The patient  reports that she quit smoking about 17 years ago. Her smoking use  included Cigarettes. She has a 20 pack-year smoking history. She has never used smokeless tobacco. She reports that she does not drink alcohol or use illicit drugs.   ROS:  Please see the history of present illness. Otherwise, complete review of systems is positive for diffuse arthritic pains and stiffness.  All other systems are reviewed and negative.   Physical Exam: VS:  BP 114/72 mmHg  Pulse 83  Ht 5\' 6"  (1.676 m)  Wt 122 lb (55.339 kg)  BMI 19.70 kg/m2  SpO2 93%, BMI Body mass index is 19.7 kg/(m^2).  Wt Readings from Last 3 Encounters:  11/27/15 122 lb (55.339 kg)  11/22/15 122 lb 8 oz (55.566 kg)  11/12/15 125 lb (56.7 kg)    General: Thin elderly woman, no distress. Using a cane. HEENT: Conjunctiva and lids normal, oropharynx clear. Neck: Supple, no elevated JVP or carotid bruits, no thyromegaly. Lungs: Clear to auscultation, nonlabored breathing at rest. Cardiac: Irregularly irregular, no S3, soft systolic murmur, no pericardial rub. Abdomen: Soft, nontender, bowel sounds present. Extremities: No pitting edema, distal pulses 2+. Skin: Warm and dry. Musculoskeletal: Kyphosis noted. Neuropsychiatric: Alert and oriented x3, affect grossly appropriate.  ECG: I personally reviewed the tracing from 11/17/2015 which showed rate-controlled atrial fibrillation with poor R-wave progression and nonspecific T-wave changes.  Recent Labwork: 08/01/2015: TSH 2.94 11/17/2015: ALT 26; AST 25; B Natriuretic Peptide 469.0*; BUN 14; Hemoglobin 10.9*; Platelets 420*; Sodium 136 11/22/2015: Creatinine, Ser 0.58; Potassium 3.8     Component Value Date/Time   CHOL 174 08/01/2015 0845   TRIG 76.0 08/01/2015 0845   TRIG 91 04/14/2011   HDL 49.00 08/01/2015 0845   CHOLHDL 4 08/01/2015 0845   VLDL 15.2 08/01/2015 0845   LDLCALC 109* 08/01/2015 0845   LDLDIRECT 136.3 04/12/2013 0930    Other Studies Reviewed Today:  Echocardiogram 05/07/2008: SUMMARY - The aortic valve was mildly to  moderately calcified. - There was mild fibrocalcific change of the aortic root. - The effective orifice of mitral regurgitation by proximal    isovelocity surface area was 0.12 cm^2. The volume of mitral    regurgitation by proximal isovelocity surface area was 17 cc. - The left atrium was mild to moderately dilated. - The right atrium was mildly dilated.  COMPARISONS - Compared to the previous study of 24-Jan-2003 :LV function now    normal.  Chest x-ray 11/17/2015: FINDINGS: Mild cardiac enlargement. The lungs are hyperinflated. Coarsened interstitial markings are noted bilaterally. Small left pleural effusion and mild interstitial edema is identified.  IMPRESSION: 1. Mild CHF. 2. Chronic lung disease  Assessment and Plan:  1. Chronic atrial fibrillation. She is not on any heart rate lowering medications, continues on Coumadin. No changes made today.  2. Recent suspected diastolic heart failure, acute on chronic exacerbation with leg edema. Her last echocardiogram was in 2009, LVEF was normal at that point, she did have a sclerotic aortic valve. Soft systolic murmur in aortic position today. We will obtain a follow-up echocardiogram to reassess cardiac structure and function. She does have a history of rheumatic fever.  3. Prior history of TIA, continues on long-term Coumadin with chronic atrial fibrillation.  4. Essential hypertension, blood pressure is normal today.  Current medicines were reviewed with the patient today.   Orders Placed This Encounter  Procedures  . Echocardiogram    Disposition: FU with me in 6 months.   Signed, Satira Sark, MD, [facility] 11/27/2015 8:56 AM    [place] [place] [facility]. [address], [place], [place] [address] Phone: [phone]; Fax: [phone]

## 2015-11-28 ENCOUNTER — Ambulatory Visit (INDEPENDENT_AMBULATORY_CARE_PROVIDER_SITE_OTHER): Payer: Medicare Other | Admitting: Family Medicine

## 2015-11-28 ENCOUNTER — Encounter: Payer: Self-pay | Admitting: Family Medicine

## 2015-11-28 VITALS — BP 130/68 | HR 89 | Temp 98.0°F

## 2015-11-28 DIAGNOSIS — M5441 Lumbago with sciatica, right side: Secondary | ICD-10-CM

## 2015-11-28 DIAGNOSIS — R791 Abnormal coagulation profile: Secondary | ICD-10-CM

## 2015-11-28 DIAGNOSIS — J984 Other disorders of lung: Secondary | ICD-10-CM

## 2015-11-28 DIAGNOSIS — J441 Chronic obstructive pulmonary disease with (acute) exacerbation: Secondary | ICD-10-CM | POA: Diagnosis not present

## 2015-11-28 DIAGNOSIS — I509 Heart failure, unspecified: Secondary | ICD-10-CM | POA: Diagnosis not present

## 2015-11-28 NOTE — Assessment & Plan Note (Signed)
Reviewed spirometry with patient - overall no significant COPD, no h/o recurrent bronchitis, no smoking history (although + 2nd hand smoke exposure). Mild restrictive pattern.  No need for daily medication. Will continue to monitor for now. Known history of pulmonary nodules, stable on last evaluation (CT 2011).

## 2015-11-28 NOTE — Progress Notes (Signed)
Pre visit review using our clinic review tool, if applicable. No additional management support is needed unless otherwise documented below in the visit note. 

## 2015-11-28 NOTE — Assessment & Plan Note (Addendum)
Lab Results  Component Value Date   INR 1.6 11/27/2015   INR 5.4 11/20/2015   INR 3.9 11/06/2015  followed by coumadin clinic at cardiology.

## 2015-11-28 NOTE — Progress Notes (Signed)
BP 130/68 mmHg  Pulse 89  Temp(Src) 98 F (36.7 C)  SpO2 94%   CC: spirometry  Subjective:    Patient ID: [name], female    DOB: [birth year], 82 y.o.   MRN: [id number]  HPI: [name] is a 81 y.o. female presenting on 11/28/2015 for Follow-up   Reviewed recent cardiology note - stable period, continue lasix PRN pedal edema.   Recent hospitalization for COPD exacerbation in setting of chronic atrial fibrillation on coumadin. Hospitalized with hypoxia. No h/o recurrent bronchitis or COPD exacerbations.   She does stay dyspneic with exertion (chronic).   I brought patient back for in office spirometry: Mild restriction. Post bronchodilator test not clearly improved. Pre: FVC 73%, FEV1 73%, ratio 0.69 Post: FVC 76%, FEV1 79%, ratio 1.03  Ongoing R lower back pain with bilateral sciatica although improving slowly. Taking tylenol 500mg  TID. She also takes robaxin. Whole tramadol tablet caused fuzzy head/loopiness.   Relevant past medical, surgical, family and social history reviewed and updated as indicated. Interim medical history since our last visit reviewed. Allergies and medications reviewed and updated. Current Outpatient Prescriptions on File Prior to Visit  Medication Sig  . albuterol (PROVENTIL HFA;VENTOLIN HFA) 108 (90 Base) MCG/ACT inhaler Inhale 2 puffs into the lungs every 6 (six) hours as needed for wheezing or shortness of breath.  . Calcium-Magnesium-Vitamin D [id number] MG-MG-UNIT TABS Take 1 tablet by mouth 2 (two) times daily.  . clonazePAM (KLONOPIN) 0.5 MG tablet Take 1 tablet (0.5 mg total) by mouth at bedtime as needed. (Patient taking differently: Take 0.5 mg by mouth at bedtime as needed for anxiety. )  . cromolyn (OPTICROM) 4 % ophthalmic solution Place 1 drop into both eyes 4 (four) times daily as needed.  . felodipine (PLENDIL) 2.5 MG 24 hr tablet Take 1 tablet (2.5 mg total) by mouth daily.  . furosemide (LASIX) 20 MG tablet Take 0.5  tablets (10 mg total) by mouth daily as needed for edema.  . ibandronate (BONIVA) 3 MG/3ML SOLN injection Inject 3 mg into the vein every 3 (three) months.   . latanoprost (XALATAN) 0.005 % ophthalmic solution Place 1 drop into both eyes at bedtime.   Marland Kitchen levothyroxine (SYNTHROID, LEVOTHROID) 50 MCG tablet TAKE 1 TABLET DAILY  . Magnesium Oxide 500 MG (LAX) TABS Take 1 tablet by mouth 3 (three) times a week.  . methocarbamol (ROBAXIN) 500 MG tablet Take 0.5-1 tablets (250-500 mg total) by mouth 2 (two) times daily as needed for muscle spasms.  . Multiple Vitamin (MULTIVITAMIN) capsule Take 1 capsule by mouth every morning.   . Multiple Vitamins-Minerals (ICAPS AREDS FORMULA PO) Take 1 tablet by mouth daily.   . Omega-3 Fatty Acids (FISH OIL CONCENTRATE) 1000 MG CAPS Take 2 capsules by mouth 2 (two) times daily.    . polyethylene glycol (MIRALAX / GLYCOLAX) packet Take 17 g by mouth daily.  . sertraline (ZOLOFT) 50 MG tablet Take 1 tablet (50 mg total) by mouth daily.  . vitamin B-12 (CYANOCOBALAMIN) 500 MCG tablet Take 500 mcg by mouth daily.  Marland Kitchen warfarin (COUMADIN) 2.5 MG tablet Take 2.5 mg by mouth daily.   . [DISCONTINUED] Calcium Carbonate-Vitamin D (CALCIUM PLUS VITAMIN D PO) Take 2 tablets by mouth daily.     No current facility-administered medications on file prior to visit.    Review of Systems Per HPI unless specifically indicated in ROS section     Objective:    BP 130/68 mmHg  Pulse 89  Temp(Src)  98 F (36.7 C)  SpO2 94%  Wt Readings from Last 3 Encounters:  11/27/15 122 lb (55.339 kg)  11/22/15 122 lb 8 oz (55.566 kg)  11/12/15 125 lb (56.7 kg)    Physical Exam  Constitutional: She appears well-developed and well-nourished. No distress.  Musculoskeletal: She exhibits no edema.  2+ DP bilaterally No cuts on feet Sensation intact at feet No pain midline spine No paraspinous mm tenderness Neg SLR bilaterally. Mild discomfort with int/ext rotation at hip. Neg  FABER. Stiff with movements.  Skin: Skin is warm and dry. No rash noted.  Psychiatric: She has a normal mood and affect.  Nursing note and vitals reviewed.  Results for orders placed or performed in visit on 11/27/15  POCT INR  Result Value Ref Range   INR 1.6    CHEST 2 VIEW COMPARISON: 10/17/2015 FINDINGS: Mild cardiac enlargement. The lungs are hyperinflated. Coarsened interstitial markings are noted bilaterally. Small left pleural effusion and mild interstitial edema is identified. IMPRESSION: 1. Mild CHF. 2. Chronic lung disease Electronically Signed  By: Kerby Moors M.D.  On: 11/17/2015 12:47    Assessment & Plan:   Problem List Items Addressed This Visit    Pain in lower back    Not consistent with claudication/arterial insufficiency. Anticipate lumbar DDD, recent xrays reassuring.  Reviewed pain regimen - 1000mg  tylenol BID + PRN tramadol 1/2 tab. Update with effect.       Relevant Medications   acetaminophen (TYLENOL) 500 MG tablet   traMADol (ULTRAM) 50 MG tablet   Chronic nonspecific lung disease ([place]) - Primary    Reviewed spirometry with patient - overall no significant COPD, no h/o recurrent bronchitis, no smoking history (although + 2nd hand smoke exposure). Mild restrictive pattern.  No need for daily medication. Will continue to monitor for now. Known history of pulmonary nodules, stable on last evaluation (CT 2011).       Relevant Orders   Spirometry with Graph (Completed)   Acute congestive heart failure ([place])    Pending echo tomorrow.      Supratherapeutic INR    Lab Results  Component Value Date   INR 1.6 11/27/2015   INR 5.4 11/20/2015   INR 3.9 11/06/2015  followed by coumadin clinic at cardiology.       RESOLVED: Chronic lung disease       Follow up plan: Return if symptoms worsen or fail to improve.  Ria Bush, MD

## 2015-11-28 NOTE — Assessment & Plan Note (Signed)
Not consistent with claudication/arterial insufficiency. Anticipate lumbar DDD, recent xrays reassuring.  Reviewed pain regimen - 1000mg  tylenol BID + PRN tramadol 1/2 tab. Update with effect.

## 2015-11-28 NOTE — Assessment & Plan Note (Signed)
Pending echo tomorrow.

## 2015-11-28 NOTE — Assessment & Plan Note (Deleted)
Reviewed spirometry with patient - overall no significant COPD, no h/o recurrent bronchitis, no smoking history (although + 2nd hand smoke exposure). Mild restrictive pattern.  No need for daily medication. Will continue to monitor for now. Known history of pulmonary nodules, stable on last evaluation (CT 2011).

## 2015-11-28 NOTE — Patient Instructions (Addendum)
Spirometry looking overall ok today - no need for extra daily medicine.  Increase tylenol to 1000mg  twice daily May take tramadol for pain as needed 1/2 tablet at a time.  Let us know how we're doing, and if not seeing improvement every day.

## 2015-11-29 ENCOUNTER — Ambulatory Visit (HOSPITAL_COMMUNITY)
Admission: RE | Admit: 2015-11-29 | Discharge: 2015-11-29 | Disposition: A | Discharge status: Home / Self Care | Payer: Medicare Other | Source: Ambulatory Visit | Attending: Cardiology | Admitting: Cardiology

## 2015-11-29 DIAGNOSIS — I5032 Chronic diastolic (congestive) heart failure: Secondary | ICD-10-CM | POA: Diagnosis not present

## 2015-11-29 DIAGNOSIS — I071 Rheumatic tricuspid insufficiency: Secondary | ICD-10-CM | POA: Diagnosis not present

## 2015-11-29 DIAGNOSIS — E785 Hyperlipidemia, unspecified: Secondary | ICD-10-CM | POA: Insufficient documentation

## 2015-11-29 DIAGNOSIS — I110 Hypertensive heart disease with heart failure: Secondary | ICD-10-CM | POA: Diagnosis not present

## 2015-11-29 DIAGNOSIS — Q211 Atrial septal defect: Secondary | ICD-10-CM | POA: Insufficient documentation

## 2015-11-29 DIAGNOSIS — I340 Nonrheumatic mitral (valve) insufficiency: Secondary | ICD-10-CM | POA: Insufficient documentation

## 2015-11-29 DIAGNOSIS — E119 Type 2 diabetes mellitus without complications: Secondary | ICD-10-CM | POA: Diagnosis not present

## 2015-11-29 DIAGNOSIS — I509 Heart failure, unspecified: Secondary | ICD-10-CM | POA: Diagnosis present

## 2015-11-29 DIAGNOSIS — I351 Nonrheumatic aortic (valve) insufficiency: Secondary | ICD-10-CM | POA: Diagnosis not present

## 2015-12-04 ENCOUNTER — Encounter: Payer: Self-pay | Admitting: Family Medicine

## 2015-12-06 ENCOUNTER — Telehealth: Payer: Self-pay

## 2015-12-06 NOTE — Telephone Encounter (Signed)
Increase tylenol scheduled to 1000mg  TID, continue tramadol 25mg  TID PRN.  Recheck on Monday. Advised pt to call first thing in AM Monday morning for appt.  Will eval for PMR.

## 2015-12-06 NOTE — Telephone Encounter (Signed)
Pt left /vm;pt seen 11/28/15; pt does not see any improvement in how pt feels since seen. Right hip still hurting badly when moves;pain level ?; pt could not put a # on pain level.  Muscles all over body are sore. Pt has a lot of difficulty getting up from sitting position. [facility] [place]. Pt request cb.

## 2015-12-09 ENCOUNTER — Ambulatory Visit (INDEPENDENT_AMBULATORY_CARE_PROVIDER_SITE_OTHER): Payer: Medicare Other | Admitting: *Deleted

## 2015-12-09 ENCOUNTER — Ambulatory Visit (INDEPENDENT_AMBULATORY_CARE_PROVIDER_SITE_OTHER): Payer: Medicare Other | Admitting: Family Medicine

## 2015-12-09 ENCOUNTER — Encounter: Payer: Self-pay | Admitting: Family Medicine

## 2015-12-09 VITALS — BP 112/68 | HR 84 | Temp 98.0°F | Wt 128.0 lb

## 2015-12-09 DIAGNOSIS — Z7901 Long term (current) use of anticoagulants: Secondary | ICD-10-CM

## 2015-12-09 DIAGNOSIS — I4891 Unspecified atrial fibrillation: Secondary | ICD-10-CM | POA: Diagnosis not present

## 2015-12-09 DIAGNOSIS — I481 Persistent atrial fibrillation: Secondary | ICD-10-CM

## 2015-12-09 DIAGNOSIS — Z5181 Encounter for therapeutic drug level monitoring: Secondary | ICD-10-CM

## 2015-12-09 DIAGNOSIS — I4819 Other persistent atrial fibrillation: Secondary | ICD-10-CM

## 2015-12-09 DIAGNOSIS — M5441 Lumbago with sciatica, right side: Secondary | ICD-10-CM

## 2015-12-09 DIAGNOSIS — G459 Transient cerebral ischemic attack, unspecified: Secondary | ICD-10-CM

## 2015-12-09 DIAGNOSIS — R791 Abnormal coagulation profile: Secondary | ICD-10-CM | POA: Diagnosis not present

## 2015-12-09 LAB — SEDIMENTATION RATE: Sed Rate: 46 mm/hr — ABNORMAL HIGH (ref 0–30)

## 2015-12-09 LAB — POCT INR: INR: 4.4

## 2015-12-09 NOTE — Assessment & Plan Note (Signed)
Likely increased tramadol/tylenol related.

## 2015-12-09 NOTE — Telephone Encounter (Addendum)
Pt left v/m to schedule appt;I spoke with pt; pt already has appt to see Dr Darnell Level 12/09/15 at 2:15.

## 2015-12-09 NOTE — Assessment & Plan Note (Addendum)
Anticipate lumbar DDD - check ESR to r/o PMR. If normal, would proceed with prednisone course vs MRI given severity of symptoms. Activity-limiting pain.

## 2015-12-09 NOTE — Progress Notes (Signed)
BP 112/68 mmHg  Pulse 84  Temp(Src) 98 F (36.7 C) (Oral)  Wt 128 lb (58.06 kg)   CC: ongoing pain  Subjective:    Patient ID: [name], female    DOB: [birth year], 82 y.o.   MRN: [id number]  HPI: [name] is a 82 y.o. female presenting on 12/09/2015 for Hip Pain   See prior note for details.   Ongoing R lower back pain with bilateral sciatica anterior thighs although improving slowly. ++ stiffness of lower back, some of shoulders. We last increased tylenol to 1054m TID, advised continue PRN tramadol 1/2-1 tablet bid. She has stopped robaxin.   Medications help but still very limited in prolonged walking and reaching.   Denies back pain while in hospital. This flare of back pain started 10/28/2015 when she came out of hospital.   Known osteoporosis DEXA 2014 (T -3.9 spine, -2.6 hip) H/o pelvic fracture 09/2011 - L pubic ramus fracture.  Longstanding on bisphosphonate (boniva IV [id number] off med for last 5 months 2/2 planned oral surgery.  Relevant past medical, surgical, family and social history reviewed and updated as indicated. Interim medical history since our last visit reviewed. Allergies and medications reviewed and updated. Current Outpatient Prescriptions on File Prior to Visit  Medication Sig  . acetaminophen (TYLENOL) 500 MG tablet Take 2 tablets (1,000 mg total) by mouth 2 (two) times daily.  . Marland Kitchenlbuterol (PROVENTIL HFA;VENTOLIN HFA) 108 (90 Base) MCG/ACT inhaler Inhale 2 puffs into the lungs every 6 (six) hours as needed for wheezing or shortness of breath.  . Calcium-Magnesium-Vitamin D [id number]-MG-UNIT TABS Take 1 tablet by mouth 2 (two) times daily.  . clonazePAM (KLONOPIN) 0.5 MG tablet Take 1 tablet (0.5 mg total) by mouth at bedtime as needed. (Patient taking differently: Take 0.5 mg by mouth at bedtime as needed for anxiety. )  . felodipine (PLENDIL) 2.5 MG 24 hr tablet Take 1 tablet (2.5 mg total) by mouth daily.  . furosemide (LASIX)  20 MG tablet Take 0.5 tablets (10 mg total) by mouth daily as needed for edema.  . ibandronate (BONIVA) 3 MG/3ML SOLN injection Inject 3 mg into the vein every 3 (three) months.   . latanoprost (XALATAN) 0.005 % ophthalmic solution Place 1 drop into both eyes at bedtime.   . Marland Kitchenevothyroxine (SYNTHROID, LEVOTHROID) 50 MCG tablet TAKE 1 TABLET DAILY  . Magnesium Oxide 500 MG (LAX) TABS Take 1 tablet by mouth 3 (three) times a week.  . Multiple Vitamin (MULTIVITAMIN) capsule Take 1 capsule by mouth every morning.   . Multiple Vitamins-Minerals (ICAPS AREDS FORMULA PO) Take 1 tablet by mouth daily.   . Omega-3 Fatty Acids (FISH OIL CONCENTRATE) 1000 MG CAPS Take 2 capsules by mouth 2 (two) times daily.    . polyethylene glycol (MIRALAX / GLYCOLAX) packet Take 17 g by mouth daily.  . sertraline (ZOLOFT) 50 MG tablet Take 1 tablet (50 mg total) by mouth daily.  . traMADol (ULTRAM) 50 MG tablet Take 0.5 tablets (25 mg total) by mouth 3 (three) times daily as needed for moderate pain.  . vitamin B-12 (CYANOCOBALAMIN) 500 MCG tablet Take 500 mcg by mouth daily.  . Marland Kitchenarfarin (COUMADIN) 2.5 MG tablet Take 2.5 mg by mouth daily.   . [DISCONTINUED] Calcium Carbonate-Vitamin D (CALCIUM PLUS VITAMIN D PO) Take 2 tablets by mouth daily.     No current facility-administered medications on file prior to visit.    Review of Systems Per HPI unless specifically indicated in  ROS section     Objective:    BP 112/68 mmHg  Pulse 84  Temp(Src) 98 F (36.7 C) (Oral)  Wt 128 lb (58.06 kg)  Wt Readings from Last 3 Encounters:  12/09/15 128 lb (58.06 kg)  11/27/15 122 lb (55.339 kg)  11/22/15 122 lb 8 oz (55.566 kg)    Physical Exam  Constitutional: She appears well-developed and well-nourished. No distress.  Stiff movements throughout  Nursing note and vitals reviewed.  Results for orders placed or performed in visit on 12/09/15  POCT INR  Result Value Ref Range   INR 4.4    DG HIP (WITH OR WITHOUT  PELVIS) 2-3V RIGHT COMPARISON: CT scan of the abdomen and pelvis dated 06/02/2012 FINDINGS: Old healed fracture of the left inferior pubic ramus. Slight joint space narrowing of both hips. The bones of the right hip otherwise appear normal. IMPRESSION: Minimal degenerative changes of the right hip. Electronically Signed  By: Lorriane Shire M.D.  On: 11/13/2015 08:23  LUMBAR SPINE - COMPLETE 4+ VIEW COMPARISON: CT scan of the abdomen pelvis dated 06/02/2012 and lumbar radiographs dated 08/26/2004 FINDINGS: There is no old compression deformity of the superior aspect of T12 as well as an old compression deformity of the inferior aspect of L1. Chronic disc space narrowing at L4-5. Slight bilateral facet arthritis at L5-S1. Diffuse osteopenia. No visible acute fracture or bone destruction. Visualized pelvic bones are normal. Extensive calcification in the abdominal aorta. IMPRESSION: No acute abnormality of the lumbar spine. Old compression deformities of T12 and L1. Marked osteopenia. Chronic degenerative disc disease at L4-5. Aortic atherosclerosis. Electronically Signed  By: Lorriane Shire M.D.  On: 11/13/2015 08:21    Assessment & Plan:   Problem List Items Addressed This Visit    Pain in lower back - Primary    Anticipate lumbar DDD - check ESR to r/o PMR. If normal, would proceed with prednisone course vs MRI given severity of symptoms. Activity-limiting pain.      Relevant Orders   Sedimentation rate   Supratherapeutic INR    Likely increased tramadol/tylenol related.           Follow up plan: Return if symptoms worsen or fail to improve.  Ria Bush, MD

## 2015-12-09 NOTE — Progress Notes (Signed)
Pre visit review using our clinic review tool, if applicable. No additional management support is needed unless otherwise documented below in the visit note. 

## 2015-12-09 NOTE — Patient Instructions (Signed)
Labs today - ESR to check for polymyalgia rheumatica. If normal, we will proceed with MRI.

## 2015-12-12 ENCOUNTER — Other Ambulatory Visit: Payer: Self-pay | Admitting: Family Medicine

## 2015-12-12 ENCOUNTER — Other Ambulatory Visit: Payer: Self-pay | Admitting: *Deleted

## 2015-12-12 DIAGNOSIS — M5441 Lumbago with sciatica, right side: Secondary | ICD-10-CM

## 2015-12-12 DIAGNOSIS — M810 Age-related osteoporosis without current pathological fracture: Secondary | ICD-10-CM

## 2015-12-12 MED ORDER — FUROSEMIDE 20 MG PO TABS: 10.0000 mg | ORAL_TABLET | Freq: Every day | ORAL | Status: DC | PRN

## 2015-12-12 MED ORDER — FELODIPINE ER 2.5 MG PO TB24: 2.5000 mg | ORAL_TABLET | Freq: Every day | ORAL | Status: DC

## 2015-12-12 MED ORDER — PREDNISONE 20 MG PO TABS: 20.0000 mg | ORAL_TABLET | Freq: Every day | ORAL | Status: DC

## 2015-12-13 ENCOUNTER — Ambulatory Visit (INDEPENDENT_AMBULATORY_CARE_PROVIDER_SITE_OTHER): Payer: Medicare Other | Admitting: Adult Health

## 2015-12-13 ENCOUNTER — Telehealth: Payer: Self-pay | Admitting: Family Medicine

## 2015-12-13 ENCOUNTER — Encounter: Payer: Self-pay | Admitting: Adult Health

## 2015-12-13 VITALS — BP 152/78 | Temp 98.3°F | Wt 127.9 lb

## 2015-12-13 DIAGNOSIS — K5901 Slow transit constipation: Secondary | ICD-10-CM | POA: Diagnosis not present

## 2015-12-13 NOTE — Progress Notes (Signed)
Subjective:    Patient ID: [name], female    DOB: July 17, 1926, 82 y.o.   MRN: [id number]  HPI 82 year old female, patient of Dr. Dionisio Paschal presents today for 3 days of constipation. She reports that about an hour after she made the appointment she had a normal bowel movement.   She has not had any abdominal pain or cramping. She denies any fevers.  She is currently taking a stool softener and metamucil. Is eating normally and drinking 64 oz of water per day. Unfortunately, she leads a sedentary life style  Reports being able to pass gas.    Review of Systems  Gastrointestinal: Positive for constipation and abdominal distention. Negative for nausea, vomiting, abdominal pain, diarrhea, blood in stool, anal bleeding and rectal pain.  Musculoskeletal: Positive for back pain, arthralgias and gait problem.  All other systems reviewed and are negative.  Past Medical History  Diagnosis Date  . History of TIA (transient ischemic attack)   . Hypothyroidism   . History of rheumatic fever   . Osteoporosis 04/2013    Thoracic compression fracture, on bisphosphonate and cal/vit D  . Paroxysmal atrial fibrillation (HCC)   . Mixed hyperlipidemia   . Essential hypertension, benign   . Diabetes type 2, controlled ([place])     Diet controlled  . Pulmonary nodules     Stable bilateral on CT 01/2010, rec rpt yearly for 2 yrs, pt decided to stop f/u  . Aortic sclerosis ([place])   . Anemia   . Arthritis   . History of skin cancer   . Anxiety   . Diverticulosis   . GERD (gastroesophageal reflux disease)   . Seasonal allergies   . History of pelvic fracture April 2013  . COPD (chronic obstructive pulmonary disease) ([place])   . Chronic gastritis 10/2009    Duodenitis on EGD  . Hiatal hernia 2011    Small  . Collagen vascular disease ([place])   . Glaucoma     Dr. Venetia Maxon  . Dry ARMD     Retinal hemorrhage (09/2013) [place]  . CKD (chronic kidney disease) stage 2, GFR 60-89 ml/min   . Macular  degeneration   . Abnormal drug screen 06/2015    Social History   Social History  . Marital Status: Married    Spouse Name: N/A  . Number of Children: N/A  . Years of Education: N/A   Occupational History  . Retired     Former Therapist, sports   Social History Main Topics  . Smoking status: Former Smoker -- 1.00 packs/day for 20 years    Types: Cigarettes    Quit date: 10/13/1998  . Smokeless tobacco: Never Used  . Alcohol Use: No  . Drug Use: No  . Sexual Activity: No   Other Topics Concern  . 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    Past Surgical History  Procedure Laterality Date  . Tonsillectomy    . Cesarean section      (and h/o 6 miscarriages)  . Breast lumpectomy  1983    LEFT, benign  . Patella fracture surgery  05/2006    LEFT  surgically repaired with pins and wire  . Dexa  05/2010    T score -4.5 spine, -2.4 femur; does not want to repeat  . Cataract extraction  2012    RIGHT  . Carotid US  2011    mild plaque formation  . Colonoscopy  03/2010    int hemorrhoids, diverticulosis, no need to repeat (Dr. Sydell Axon)  . Esophagogastroduodenoscopy  10/2009    small HH, multiple antric ulcerations s/p biopsy, mild chronic gastritis/duodenitis  . Esophagogastroduodenoscopy  05/26/2012    RMR: small HH, o/w normal.   . Dilation and curettage of uterus      x2  . Laparoscopic cholecystectomy  02/2013    Dr. Geroge Baseman  . Cholecystectomy N/A 02/27/2013    Procedure: LAPAROSCOPIC CHOLECYSTECTOMY;  Surgeon: Donato Heinz, MD;  Location: [facility];  Service: General;  Laterality: N/A;  . Dexa  04/2013    T score 3.9 [facility] spine, 2.6 hip    Family History  Problem Relation Age of Onset  . Coronary artery  disease Mother 90    MI deceased  . Colon cancer Father 10  . Arrhythmia Sister   . Stroke Paternal Grandmother   . Diabetes Neg Hx     Allergies  Allergen Reactions  . Propoxyphene N-Acetaminophen Shortness Of Breath  . Codeine Nausea And Vomiting  . Meperidine Hcl Nausea And Vomiting  . Morphine Nausea And Vomiting  . Shellfish Allergy Hives    Current Outpatient Prescriptions on File Prior to Visit  Medication Sig Dispense Refill  . acetaminophen (TYLENOL) 500 MG tablet Take 2 tablets (1,000 mg total) by mouth 2 (two) times daily.    Marland Kitchen albuterol (PROVENTIL HFA;VENTOLIN HFA) 108 (90 Base) MCG/ACT inhaler Inhale 2 puffs into the lungs every 6 (six) hours as needed for wheezing or shortness of breath. 1 Inhaler 0  . Calcium-Magnesium-Vitamin D [id number] MG-MG-UNIT TABS Take 1 tablet by mouth 2 (two) times daily.    . clonazePAM (KLONOPIN) 0.5 MG tablet Take 1 tablet (0.5 mg total) by mouth at bedtime as needed. (Patient taking differently: Take 0.5 mg by mouth at bedtime as needed for anxiety. ) 90 tablet 0  . felodipine (PLENDIL) 2.5 MG 24 hr tablet Take 1 tablet (2.5 mg total) by mouth daily. 90 tablet 1  . furosemide (LASIX) 20 MG tablet Take 0.5 tablets (10 mg total) by mouth daily as needed for edema. 90 tablet 0  . ibandronate (BONIVA) 3 MG/3ML SOLN injection Inject 3 mg into the vein every 3 (three) months.  3.6 mL   . latanoprost (XALATAN) 0.005 % ophthalmic solution Place 1 drop into both eyes at bedtime.     Marland Kitchen levothyroxine (SYNTHROID, LEVOTHROID) 50 MCG tablet TAKE 1 TABLET DAILY 90 tablet 3  . Magnesium Oxide 500 MG (LAX) TABS Take 1 tablet by mouth 3 (three) times a week.    . Multiple Vitamin (MULTIVITAMIN) capsule Take 1 capsule by mouth every morning.     . Multiple Vitamins-Minerals (ICAPS AREDS FORMULA PO) Take 1 tablet by mouth daily.     . Omega-3 Fatty Acids (FISH OIL CONCENTRATE) 1000 MG CAPS Take 2 capsules by mouth 2 (two) times daily.      . polyethylene  glycol (MIRALAX / GLYCOLAX) packet Take 17 g by mouth daily. 30 each 3  . predniSONE (DELTASONE) 20 MG tablet Take 1 tablet (20 mg total) by mouth daily with breakfast. 5 tablet 0  . sertraline (ZOLOFT) 50 MG tablet Take 1 tablet (50 mg total)  by mouth daily. 90 tablet 0  . traMADol (ULTRAM) 50 MG tablet Take 0.5 tablets (25 mg total) by mouth 3 (three) times daily as needed for moderate pain.    . vitamin B-12 (CYANOCOBALAMIN) 500 MCG tablet Take 500 mcg by mouth daily.    Marland Kitchen warfarin (COUMADIN) 2.5 MG tablet Take 2.5 mg by mouth daily.     . [DISCONTINUED] Calcium Carbonate-Vitamin D (CALCIUM PLUS VITAMIN D PO) Take 2 tablets by mouth daily.       No current facility-administered medications on file prior to visit.    BP 152/78 mmHg  Temp(Src) 98.3 F (36.8 C) (Oral)  Wt 127 lb 14.4 oz (58.015 kg)       Objective:   Physical Exam  Constitutional: She is oriented to person, place, and time. She appears well-developed and well-nourished. No distress.  Cardiovascular: Normal rate, regular rhythm, normal heart sounds and intact distal pulses.  Exam reveals no gallop.   No murmur heard. Pulmonary/Chest: Effort normal and breath sounds normal. No respiratory distress. She has no wheezes. She has no rales. She exhibits no tenderness.  Abdominal: Soft. She exhibits distension. She exhibits no mass. Bowel sounds are increased. There is no tenderness. There is no rebound and no guarding.  Genitourinary: Rectal exam shows external hemorrhoid.  No stool in vault   Neurological: She is alert and oriented to person, place, and time.  Skin: Skin is warm and dry. No rash noted. She is not diaphoretic. No erythema. No pallor.  Psychiatric: She has a normal mood and affect. Her behavior is normal. Judgment and thought content normal.  Nursing note and vitals reviewed.      Assessment & Plan:  1. Slow transit constipation - Seems to be resolving - Advised to drink a glass of warm prune juice  daily  - Can increase Metamucil to BID - Follow up with PCP for increased constipation   Dorothyann Peng, NP

## 2015-12-13 NOTE — Telephone Encounter (Signed)
Patient Name: [name]  DOB: 02-09-1926    Initial Comment she has gone 3 days without a bowel movement.    Nurse Assessment  Nurse: Mallie Mussel, RN, Alveta Heimlich Date/Time [name] Time): 12/13/2015 9:48:52 AM  Confirm and document reason for call. If symptomatic, describe symptoms. You must click the next button to save text entered. ---Caller states that she has not had a BM in the last 3 days. Denies abdominal and rectal pain. She is on Tramadol for chronic pain. She takes Miralax daily and MOM three times a week. She denies vomiting. She has been eating. She denies any bleeding.  Has the patient traveled out of the country within the last 30 days? ---No  Does the patient have any new or worsening symptoms? ---Yes  Will a triage be completed? ---Yes  Related visit to physician within the last 2 weeks? ---No  Does the PT have any chronic conditions? (i.e. diabetes, asthma, etc.) ---Yes  List chronic conditions. ---Chronic Pain, Borderline Diabetes, HTN, Hypercholesterolemia  Is this a behavioral health or substance abuse call? ---No     Guidelines    Guideline Title Affirmed Question Affirmed Notes  Constipation Unable to have a bowel movement (BM) without laxative or enema    Final Disposition User   See PCP When Office is Open (within 3 days) Mallie Mussel, RN, Alveta Heimlich    Comments  No appointments available at [facility]. I was able to schedule her for 2:30pm today with Sallee Provencal at [place].   Referrals  REFERRED TO PCP OFFICE   Disagree/Comply: Comply

## 2015-12-13 NOTE — Telephone Encounter (Signed)
Pt has appt with Dorothyann Peng NP 12/13/15 at 2:30.

## 2015-12-13 NOTE — Patient Instructions (Addendum)
It was great seeing you today!  I would recommenced that you have a glass of warm prune juice daily.   You can also increase your Metamucil to twice a day   Try and walk as much as you can  Follow up with PCP as needed   Constipation, Adult Constipation is when a person has fewer than three bowel movements a week, has difficulty having a bowel movement, or has stools that are dry, hard, or larger than normal. As people grow older, constipation is more common. A low-fiber diet, not taking in enough fluids, and taking certain medicines may make constipation worse.  CAUSES   Certain medicines, such as antidepressants, pain medicine, iron supplements, antacids, and water pills.   Certain diseases, such as diabetes, irritable bowel syndrome (IBS), thyroid disease, or depression.   Not drinking enough water.   Not eating enough fiber-rich foods.   Stress or travel.   Lack of physical activity or exercise.   Ignoring the urge to have a bowel movement.   Using laxatives too much.  SIGNS AND SYMPTOMS   Having fewer than three bowel movements a week.   Straining to have a bowel movement.   Having stools that are hard, dry, or larger than normal.   Feeling full or bloated.   Pain in the lower abdomen.   Not feeling relief after having a bowel movement.  DIAGNOSIS  Your health care provider will take a medical history and perform a physical exam. Further testing may be done for severe constipation. Some tests may include:  A barium enema X-ray to examine your rectum, colon, and, sometimes, your small intestine.   A sigmoidoscopy to examine your lower colon.   A colonoscopy to examine your entire colon. TREATMENT  Treatment will depend on the severity of your constipation and what is causing it. Some dietary treatments include drinking more fluids and eating more fiber-rich foods. Lifestyle treatments may include regular exercise. If these diet and lifestyle  recommendations do not help, your health care provider may recommend taking over-the-counter laxative medicines to help you have bowel movements. Prescription medicines may be prescribed if over-the-counter medicines do not work.  HOME CARE INSTRUCTIONS   Eat foods that have a lot of fiber, such as fruits, vegetables, whole grains, and beans.  Limit foods high in fat and processed sugars, such as french fries, hamburgers, cookies, candies, and soda.   A fiber supplement may be added to your diet if you cannot get enough fiber from foods.   Drink enough fluids to keep your urine clear or pale yellow.   Exercise regularly or as directed by your health care provider.   Go to the restroom when you have the urge to go. Do not hold it.   Only take over-the-counter or prescription medicines as directed by your health care provider. Do not take other medicines for constipation without talking to your health care provider first.  [place] IF:   You have bright red blood in your stool.   Your constipation lasts for more than 4 days or gets worse.   You have abdominal or rectal pain.   You have thin, pencil-like stools.   You have unexplained weight loss. MAKE SURE YOU:   Understand these instructions.  Will watch your condition.  Will get help right away if you are not doing well or get worse.   This information is not intended to replace advice given to you by your health care provider. Make  sure you discuss any questions you have with your health care provider.   Document Released: 04/03/2004 Document Revised: 07/27/2014 Document Reviewed: 04/17/2013 Elsevier Interactive Patient Education [employer].

## 2015-12-17 ENCOUNTER — Ambulatory Visit (INDEPENDENT_AMBULATORY_CARE_PROVIDER_SITE_OTHER): Payer: Medicare Other | Admitting: Family Medicine

## 2015-12-17 ENCOUNTER — Encounter: Payer: Self-pay | Admitting: Family Medicine

## 2015-12-17 VITALS — BP 122/74 | HR 86 | Temp 98.1°F | Ht 66.0 in | Wt 125.5 lb

## 2015-12-17 DIAGNOSIS — I481 Persistent atrial fibrillation: Secondary | ICD-10-CM

## 2015-12-17 DIAGNOSIS — I4819 Other persistent atrial fibrillation: Secondary | ICD-10-CM

## 2015-12-17 DIAGNOSIS — R1031 Right lower quadrant pain: Secondary | ICD-10-CM

## 2015-12-17 DIAGNOSIS — M5441 Lumbago with sciatica, right side: Secondary | ICD-10-CM

## 2015-12-17 LAB — PROTIME-INR
INR: 2.8 ratio — ABNORMAL HIGH (ref 0.8–1.0)
PROTHROMBIN TIME: 30 s — AB (ref 9.6–13.1)

## 2015-12-17 LAB — COMPREHENSIVE METABOLIC PANEL
ALBUMIN: 3.8 g/dL (ref 3.5–5.2)
ALT: 19 U/L (ref 0–35)
AST: 22 U/L (ref 0–37)
Alkaline Phosphatase: 76 U/L (ref 39–117)
BUN: 10 mg/dL (ref 6–23)
CHLORIDE: 102 meq/L (ref 96–112)
CO2: 31 meq/L (ref 19–32)
CREATININE: 0.67 mg/dL (ref 0.40–1.20)
Calcium: 9 mg/dL (ref 8.4–10.5)
GFR: 88.23 mL/min (ref >60.00)
GLUCOSE: 133 mg/dL — AB (ref 70–99)
POTASSIUM: 3.7 meq/L (ref 3.5–5.1)
SODIUM: 141 meq/L (ref 135–145)
Total Bilirubin: 0.5 mg/dL (ref 0.2–1.2)
Total Protein: 7.1 g/dL (ref 6.0–8.3)

## 2015-12-17 LAB — CBC WITH DIFFERENTIAL/PLATELET
BASOS ABS: 0 10*3/uL (ref 0.0–0.1)
Basophils Relative: 0.2 % (ref 0.0–3.0)
Eosinophils Absolute: 0 10*3/uL (ref 0.0–0.7)
Eosinophils Relative: 0.3 % (ref 0.0–5.0)
HCT: 37.3 % (ref 36.0–46.0)
Hemoglobin: 12 g/dL (ref 12.0–15.0)
LYMPHS ABS: 1.3 10*3/uL (ref 0.7–4.0)
Lymphocytes Relative: 11.3 % — ABNORMAL LOW (ref 12.0–46.0)
MCHC: 32.3 g/dL (ref 30.0–36.0)
MCV: 95.3 fl (ref 78.0–100.0)
MONO ABS: 0.4 10*3/uL (ref 0.1–1.0)
MONOS PCT: 3.6 % (ref 3.0–12.0)
NEUTROS PCT: 84.6 % — AB (ref 43.0–77.0)
Neutro Abs: 9.7 10*3/uL — ABNORMAL HIGH (ref 1.4–7.7)
Platelets: 667 10*3/uL — ABNORMAL HIGH (ref 150.0–400.0)
RBC: 3.91 Mil/uL (ref 3.87–5.11)
RDW: 17.3 % — ABNORMAL HIGH (ref 11.5–15.5)
WBC: 11.4 10*3/uL — AB (ref 4.0–10.5)

## 2015-12-17 LAB — LIPASE: LIPASE: 10 U/L — AB (ref 11.0–59.0)

## 2015-12-17 MED ORDER — CLONAZEPAM 0.5 MG PO TABS: 0.5000 mg | ORAL_TABLET | Freq: Every evening | ORAL | Status: DC | PRN

## 2015-12-17 NOTE — Progress Notes (Signed)
Pre visit review using our clinic review tool, if applicable. No additional management support is needed unless otherwise documented below in the visit note. 

## 2015-12-17 NOTE — Assessment & Plan Note (Signed)
New over weekend with watery diarrhea. ?diverticulitis - check CBC, CMP, lipase and abd/pelvic CT scan given age and comorbidities (on coumadin weight loss,ongoing malaise for last 2 months). Pending MRI this weekend for longstanding back pain.

## 2015-12-17 NOTE — Assessment & Plan Note (Signed)
Pending MRI this weekend.

## 2015-12-17 NOTE — Patient Instructions (Addendum)
We will await MRI this weekend. For abdominal pain - labs today and we will check abdominal CT scan - pass by Allison's office to schedule this. Clear liquid to bland diet over next 2 days.  Hold miralax until feeling better.

## 2015-12-17 NOTE — Progress Notes (Signed)
BP 122/74 mmHg  Pulse 86  Temp(Src) 98.1 F (36.7 C) (Oral)  Ht '5\' 6"'  (1.676 m)  Wt 125 lb 8 oz (56.926 kg)  BMI 20.27 kg/m2   CC: abd pain  Subjective:    Patient ID: [name], female    DOB: 05/06/1926, 82 y.o.   MRN: [id number]  HPI: [name] is a 82 y.o. female presenting on 12/17/2015 for Abdominal Pain   4-5d h/o abd pain with diarrhea and nausea. Seen for constipation by Tommi Rumps NP at [place] - actually had large BM that day and felt well. rec increase miralax to BID and drink prune juice daily. She did not change miralax.  Over weekend started developing dark liquid watery stool twice daily. Also with lower R abdominal pain described as grabbing pain that wakes her up at night time. Pain improves with ambulation. Ongoing weight loss, decreased appetite. No fevers/chills, blood in urine or stool. + ongoing longstanding night sweats. No new foods, restaurants, travel. Uses well water. Husband not sick.   See recent office notes and labs - ?PMR, ESR 46. Started on prednisone 5d course empirically for possible PMR - no improvement noted.   Requests clonopin 0.65m nightly refilled, taking whole tablet at night.   Relevant past medical, surgical, family and social history reviewed and updated as indicated. Interim medical history since our last visit reviewed. Allergies and medications reviewed and updated. Current Outpatient Prescriptions on File Prior to Visit  Medication Sig  . acetaminophen (TYLENOL) 500 MG tablet Take 2 tablets (1,000 mg total) by mouth 2 (two) times daily.  .Marland Kitchenalbuterol (PROVENTIL HFA;VENTOLIN HFA) 108 (90 Base) MCG/ACT inhaler Inhale 2 puffs into the lungs every 6 (six) hours as needed for wheezing or shortness of breath.  . Calcium-Magnesium-Vitamin D [id number]-MG-UNIT TABS Take 1 tablet by mouth 2 (two) times daily.  . felodipine (PLENDIL) 2.5 MG 24 hr tablet Take 1 tablet (2.5 mg total) by mouth daily.  . furosemide (LASIX) 20 MG  tablet Take 0.5 tablets (10 mg total) by mouth daily as needed for edema.  . ibandronate (BONIVA) 3 MG/3ML SOLN injection Inject 3 mg into the vein every 3 (three) months.   . latanoprost (XALATAN) 0.005 % ophthalmic solution Place 1 drop into both eyes at bedtime.   .Marland Kitchenlevothyroxine (SYNTHROID, LEVOTHROID) 50 MCG tablet TAKE 1 TABLET DAILY  . Magnesium Oxide 500 MG (LAX) TABS Take 1 tablet by mouth 3 (three) times a week.  . Multiple Vitamin (MULTIVITAMIN) capsule Take 1 capsule by mouth every morning.   . Multiple Vitamins-Minerals (ICAPS AREDS FORMULA PO) Take 1 tablet by mouth daily.   . Omega-3 Fatty Acids (FISH OIL CONCENTRATE) 1000 MG CAPS Take 2 capsules by mouth 2 (two) times daily.    . polyethylene glycol (MIRALAX / GLYCOLAX) packet Take 17 g by mouth daily.  . predniSONE (DELTASONE) 20 MG tablet Take 1 tablet (20 mg total) by mouth daily with breakfast.  . sertraline (ZOLOFT) 50 MG tablet Take 1 tablet (50 mg total) by mouth daily.  . traMADol (ULTRAM) 50 MG tablet Take 0.5 tablets (25 mg total) by mouth 3 (three) times daily as needed for moderate pain.  . vitamin B-12 (CYANOCOBALAMIN) 500 MCG tablet Take 500 mcg by mouth daily.  .Marland Kitchenwarfarin (COUMADIN) 2.5 MG tablet Take 2.5 mg by mouth daily.   . [DISCONTINUED] Calcium Carbonate-Vitamin D (CALCIUM PLUS VITAMIN D PO) Take 2 tablets by mouth daily.     No current facility-administered medications  on file prior to visit.    Review of Systems Per HPI unless specifically indicated in ROS section     Objective:    BP 122/74 mmHg  Pulse 86  Temp(Src) 98.1 F (36.7 C) (Oral)  Ht '5\' 6"'  (1.676 m)  Wt 125 lb 8 oz (56.926 kg)  BMI 20.27 kg/m2  Wt Readings from Last 3 Encounters:  12/17/15 125 lb 8 oz (56.926 kg)  12/13/15 127 lb 14.4 oz (58.015 kg)  12/09/15 128 lb (58.06 kg)    Physical Exam  Constitutional: She appears well-developed and well-nourished. No distress.  HENT:  Head: Normocephalic and atraumatic.  Mouth/Throat:  Oropharynx is clear and moist. No oropharyngeal exudate.  Eyes: Conjunctivae and EOM are normal. Pupils are equal, round, and reactive to light. No scleral icterus.  Cardiovascular: Normal rate, normal heart sounds and intact distal pulses.  An irregular rhythm present.  No murmur heard. Pulmonary/Chest: Effort normal and breath sounds normal. No respiratory distress. She has no wheezes. She has no rales.  coarse  Abdominal: Soft. Normal appearance and bowel sounds are normal. She exhibits no distension and no mass. There is no hepatosplenomegaly. There is tenderness (very mild) in the right lower quadrant. There is no rigidity, no rebound, no guarding, no CVA tenderness and negative Murphy's sign.  Musculoskeletal: She exhibits no edema.  Continued marked pain/stiffness with mobility  Skin: Skin is warm and dry. No rash noted.  Psychiatric: She has a normal mood and affect.  Nursing note and vitals reviewed.  Results for orders placed or performed in visit on 12/09/15  Sedimentation rate  Result Value Ref Range   Sed Rate 46 (H) 0 - 30 mm/hr   Lab Results  Component Value Date   INR 4.4 12/09/2015   INR 1.6 11/27/2015   INR 5.4 11/20/2015       Assessment & Plan:   Problem List Items Addressed This Visit    Persistent atrial fibrillation ([place])   Relevant Orders   Protime-INR   Pain in lower back    Pending MRI this weekend.      RLQ abdominal pain - Primary    New over weekend with watery diarrhea. ?diverticulitis - check CBC, CMP, lipase and abd/pelvic CT scan given age and comorbidities (on coumadin weight loss,ongoing malaise for last 2 months). Pending MRI this weekend for longstanding back pain.      Relevant Orders   Comprehensive metabolic panel   CBC with Differential/Platelet   Lipase   CT Abdomen Pelvis W Contrast       Follow up plan: Return if symptoms worsen or fail to improve.  Ria Bush, MD

## 2015-12-18 MED ORDER — ALBUTEROL SULFATE (2.5 MG/3ML) 0.083% IN NEBU
2.5000 mg | INHALATION_SOLUTION | Freq: Once | RESPIRATORY_TRACT | Status: AC
  Administered 2015-11-28: 2.5 mg via RESPIRATORY_TRACT

## 2015-12-18 NOTE — Addendum Note (Signed)
Addended by: Royann Shivers A on: 12/18/2015 08:45 AM   Modules accepted: Orders

## 2015-12-19 ENCOUNTER — Ambulatory Visit
Admission: RE | Admit: 2015-12-19 | Discharge: 2015-12-19 | Disposition: A | Discharge status: Home / Self Care | Payer: Medicare Other | Source: Ambulatory Visit | Attending: Family Medicine | Admitting: Family Medicine

## 2015-12-19 DIAGNOSIS — R1031 Right lower quadrant pain: Secondary | ICD-10-CM | POA: Diagnosis not present

## 2015-12-19 MED ORDER — IOPAMIDOL (ISOVUE-300) INJECTION 61%
100.0000 mL | Freq: Once | INTRAVENOUS | Status: AC | PRN
  Administered 2015-12-19: 100 mL via INTRAVENOUS

## 2015-12-21 ENCOUNTER — Ambulatory Visit
Admission: RE | Admit: 2015-12-21 | Discharge: 2015-12-21 | Disposition: A | Discharge status: Home / Self Care | Payer: Medicare Other | Source: Ambulatory Visit | Attending: Family Medicine | Admitting: Family Medicine

## 2015-12-21 DIAGNOSIS — M810 Age-related osteoporosis without current pathological fracture: Secondary | ICD-10-CM

## 2015-12-21 DIAGNOSIS — M5126 Other intervertebral disc displacement, lumbar region: Secondary | ICD-10-CM | POA: Diagnosis not present

## 2015-12-21 DIAGNOSIS — M5441 Lumbago with sciatica, right side: Secondary | ICD-10-CM

## 2015-12-23 ENCOUNTER — Telehealth: Payer: Self-pay | Admitting: Family Medicine

## 2015-12-23 ENCOUNTER — Ambulatory Visit (INDEPENDENT_AMBULATORY_CARE_PROVIDER_SITE_OTHER): Payer: Medicare Other | Admitting: *Deleted

## 2015-12-23 DIAGNOSIS — Z7901 Long term (current) use of anticoagulants: Secondary | ICD-10-CM

## 2015-12-23 DIAGNOSIS — G459 Transient cerebral ischemic attack, unspecified: Secondary | ICD-10-CM

## 2015-12-23 DIAGNOSIS — I4819 Other persistent atrial fibrillation: Secondary | ICD-10-CM

## 2015-12-23 DIAGNOSIS — Z5181 Encounter for therapeutic drug level monitoring: Secondary | ICD-10-CM

## 2015-12-23 DIAGNOSIS — I4891 Unspecified atrial fibrillation: Secondary | ICD-10-CM | POA: Diagnosis not present

## 2015-12-23 DIAGNOSIS — IMO0001 Reserved for inherently not codable concepts without codable children: Secondary | ICD-10-CM

## 2015-12-23 DIAGNOSIS — M4850XA Collapsed vertebra, not elsewhere classified, site unspecified, initial encounter for fracture: Secondary | ICD-10-CM | POA: Insufficient documentation

## 2015-12-23 DIAGNOSIS — I481 Persistent atrial fibrillation: Secondary | ICD-10-CM

## 2015-12-23 DIAGNOSIS — M5441 Lumbago with sciatica, right side: Secondary | ICD-10-CM

## 2015-12-23 LAB — POCT INR: INR: 2.9

## 2015-12-23 MED ORDER — ACETAMINOPHEN 500 MG PO TABS: 1000.0000 mg | ORAL_TABLET | Freq: Three times a day (TID) | ORAL | Status: AC

## 2015-12-23 MED ORDER — CALCITONIN (SALMON) 200 UNIT/ACT NA SOLN: 1.0000 | Freq: Every day | NASAL | Status: DC

## 2015-12-23 NOTE — Telephone Encounter (Signed)
CT abd/pelvis w/ contrast 12/2015:  1. Enlarged heart, thoracic/abd aortic ATH and tortuosity  2. Stable 1.1cm LLL nodule 3. R heart failure (dilated hepatic veins, IVC) 4. Kidney cysts 5. Slight thickening of stomach antrum, duodenal diverticulum with air fluid level 6. Osteopenia with diffuse degeneration of spine, pelvis, hips, chronic appearing T12/L4 compression fractures  MRI lumbar spine without contrast 12/2015: 1. Progression of L4 vertebral fracture since 4/25 - essentially healed 2. Healed L1/T12 fractures as well 3. New L3 fracture 4. Diffuse degenerative disc disease, broad bulging L3/4 with mod bilat foraminal stenosis  Spoke with patient - discussed above.  Dicussed pain regimen and med list updated. Start calcitonin nasal spray Refer to spine to discuss possible kyphoplasty benefits.

## 2015-12-23 NOTE — Telephone Encounter (Signed)
Patient called to get the results of her CT and MRI.

## 2015-12-25 ENCOUNTER — Telehealth: Payer: Self-pay

## 2015-12-25 NOTE — Telephone Encounter (Signed)
Great to hear   thanks 

## 2015-12-25 NOTE — Telephone Encounter (Signed)
Pt left v/m; the pain med is helping tremendously; has occasional break thru of pain but pt is much more comfortable.FYI to Dr Darnell Level.

## 2015-12-26 ENCOUNTER — Telehealth: Payer: Self-pay | Admitting: *Deleted

## 2015-12-26 NOTE — Telephone Encounter (Signed)
Pt called.  States she is having oral surgery on 6/13 and was told she will have to be off her coumadin 4 days.  She was told by surgeon to take the last dose of coumadin tonight.  I told pt to resume coumadin night of procedure taking 5mg  x 2 days then resume 2.5mg  daily except 3.75mg  on Mondays.  Has INR appt for 01/06/16

## 2015-12-29 ENCOUNTER — Encounter (HOSPITAL_COMMUNITY): Payer: Self-pay | Admitting: *Deleted

## 2015-12-29 ENCOUNTER — Emergency Department (HOSPITAL_COMMUNITY): Payer: Medicare Other

## 2015-12-29 ENCOUNTER — Emergency Department (HOSPITAL_COMMUNITY)
Admission: EM | Admit: 2015-12-29 | Discharge: 2015-12-29 | Disposition: A | Discharge status: Home / Self Care | Payer: Medicare Other | Attending: Emergency Medicine | Admitting: Emergency Medicine

## 2015-12-29 DIAGNOSIS — M1990 Unspecified osteoarthritis, unspecified site: Secondary | ICD-10-CM | POA: Diagnosis not present

## 2015-12-29 DIAGNOSIS — I129 Hypertensive chronic kidney disease with stage 1 through stage 4 chronic kidney disease, or unspecified chronic kidney disease: Secondary | ICD-10-CM | POA: Diagnosis not present

## 2015-12-29 DIAGNOSIS — Z79899 Other long term (current) drug therapy: Secondary | ICD-10-CM | POA: Diagnosis not present

## 2015-12-29 DIAGNOSIS — I480 Paroxysmal atrial fibrillation: Secondary | ICD-10-CM | POA: Insufficient documentation

## 2015-12-29 DIAGNOSIS — M545 Low back pain: Secondary | ICD-10-CM | POA: Diagnosis present

## 2015-12-29 DIAGNOSIS — M5442 Lumbago with sciatica, left side: Secondary | ICD-10-CM

## 2015-12-29 DIAGNOSIS — E1122 Type 2 diabetes mellitus with diabetic chronic kidney disease: Secondary | ICD-10-CM | POA: Diagnosis not present

## 2015-12-29 DIAGNOSIS — J449 Chronic obstructive pulmonary disease, unspecified: Secondary | ICD-10-CM | POA: Diagnosis not present

## 2015-12-29 DIAGNOSIS — E8770 Fluid overload, unspecified: Secondary | ICD-10-CM | POA: Diagnosis not present

## 2015-12-29 DIAGNOSIS — Z85828 Personal history of other malignant neoplasm of skin: Secondary | ICD-10-CM | POA: Diagnosis not present

## 2015-12-29 DIAGNOSIS — I517 Cardiomegaly: Secondary | ICD-10-CM | POA: Diagnosis not present

## 2015-12-29 DIAGNOSIS — Z7901 Long term (current) use of anticoagulants: Secondary | ICD-10-CM | POA: Insufficient documentation

## 2015-12-29 DIAGNOSIS — I714 Abdominal aortic aneurysm, without rupture: Secondary | ICD-10-CM | POA: Diagnosis not present

## 2015-12-29 DIAGNOSIS — Z87891 Personal history of nicotine dependence: Secondary | ICD-10-CM | POA: Insufficient documentation

## 2015-12-29 DIAGNOSIS — M5489 Other dorsalgia: Secondary | ICD-10-CM | POA: Diagnosis not present

## 2015-12-29 DIAGNOSIS — N182 Chronic kidney disease, stage 2 (mild): Secondary | ICD-10-CM | POA: Insufficient documentation

## 2015-12-29 DIAGNOSIS — R52 Pain, unspecified: Secondary | ICD-10-CM | POA: Diagnosis not present

## 2015-12-29 DIAGNOSIS — E782 Mixed hyperlipidemia: Secondary | ICD-10-CM | POA: Diagnosis not present

## 2015-12-29 DIAGNOSIS — Z8673 Personal history of transient ischemic attack (TIA), and cerebral infarction without residual deficits: Secondary | ICD-10-CM | POA: Diagnosis not present

## 2015-12-29 DIAGNOSIS — M810 Age-related osteoporosis without current pathological fracture: Secondary | ICD-10-CM | POA: Insufficient documentation

## 2015-12-29 DIAGNOSIS — E039 Hypothyroidism, unspecified: Secondary | ICD-10-CM | POA: Insufficient documentation

## 2015-12-29 LAB — CBC WITH DIFFERENTIAL/PLATELET
BASOS ABS: 0.1 10*3/uL (ref 0.0–0.1)
Basophils Relative: 1 %
EOS PCT: 2 %
Eosinophils Absolute: 0.1 10*3/uL (ref 0.0–0.7)
HEMATOCRIT: 31.8 % — AB (ref 36.0–46.0)
Hemoglobin: 10.4 g/dL — ABNORMAL LOW (ref 12.0–15.0)
LYMPHS ABS: 1.5 10*3/uL (ref 0.7–4.0)
LYMPHS PCT: 17 %
MCH: 31 pg (ref 26.0–34.0)
MCHC: 32.7 g/dL (ref 30.0–36.0)
MCV: 94.9 fL (ref 78.0–100.0)
MONO ABS: 1 10*3/uL (ref 0.1–1.0)
MONOS PCT: 12 %
NEUTROS ABS: 6.2 10*3/uL (ref 1.7–7.7)
Neutrophils Relative %: 70 %
PLATELETS: 362 10*3/uL (ref 150–400)
RBC: 3.35 MIL/uL — ABNORMAL LOW (ref 3.87–5.11)
RDW: 16.4 % — AB (ref 11.5–15.5)
WBC: 8.9 10*3/uL (ref 4.0–10.5)

## 2015-12-29 LAB — URINALYSIS, ROUTINE W REFLEX MICROSCOPIC
Bilirubin Urine: NEGATIVE
GLUCOSE, UA: NEGATIVE mg/dL
HGB URINE DIPSTICK: NEGATIVE
Ketones, ur: NEGATIVE mg/dL
LEUKOCYTES UA: NEGATIVE
Nitrite: NEGATIVE
PH: 7 (ref 5.0–8.0)
PROTEIN: NEGATIVE mg/dL

## 2015-12-29 LAB — BASIC METABOLIC PANEL
ANION GAP: 4 — AB (ref 5–15)
BUN: 10 mg/dL (ref 6–20)
CHLORIDE: 108 mmol/L (ref 101–111)
CO2: 26 mmol/L (ref 22–32)
Calcium: 7.9 mg/dL — ABNORMAL LOW (ref 8.9–10.3)
Creatinine, Ser: 0.5 mg/dL (ref 0.44–1.00)
GFR calc Af Amer: >60 mL/min (ref >60)
GLUCOSE: 112 mg/dL — AB (ref 65–99)
POTASSIUM: 3.3 mmol/L — AB (ref 3.5–5.1)
Sodium: 138 mmol/L (ref 135–145)

## 2015-12-29 LAB — TROPONIN I: Troponin I: <0.03 ng/mL (ref <0.031)

## 2015-12-29 LAB — BRAIN NATRIURETIC PEPTIDE: B Natriuretic Peptide: 584 pg/mL — ABNORMAL HIGH (ref 0.0–100.0)

## 2015-12-29 MED ORDER — PREDNISONE 20 MG PO TABS: ORAL_TABLET | ORAL | Status: DC

## 2015-12-29 MED ORDER — SODIUM CHLORIDE 0.9 % IV BOLUS (SEPSIS)
1000.0000 mL | Freq: Once | INTRAVENOUS | Status: AC
  Administered 2015-12-29: 1000 mL via INTRAVENOUS

## 2015-12-29 MED ORDER — FENTANYL CITRATE (PF) 100 MCG/2ML IJ SOLN
50.0000 ug | Freq: Once | INTRAMUSCULAR | Status: AC
  Administered 2015-12-29: 50 ug via INTRAVENOUS
  Filled 2015-12-29: qty 2

## 2015-12-29 MED ORDER — FUROSEMIDE 10 MG/ML IJ SOLN
40.0000 mg | Freq: Once | INTRAMUSCULAR | Status: AC
  Administered 2015-12-29: 40 mg via INTRAVENOUS
  Filled 2015-12-29: qty 4

## 2015-12-29 MED ORDER — DIAZEPAM 5 MG/ML IJ SOLN
2.5000 mg | Freq: Once | INTRAMUSCULAR | Status: AC
  Administered 2015-12-29: 2.5 mg via INTRAVENOUS
  Filled 2015-12-29: qty 2

## 2015-12-29 MED ORDER — PREDNISONE 50 MG PO TABS
60.0000 mg | ORAL_TABLET | Freq: Once | ORAL | Status: AC
  Administered 2015-12-29: 60 mg via ORAL
  Filled 2015-12-29: qty 1

## 2015-12-29 NOTE — ED Notes (Signed)
Patient ambulated in hallway with two person assist. Kept complaining of a "catch" in her right lower pelvic area. Upon return to room, patient's oxygen sats 80% on RA. States that she was told she had COPD on last admission. States she is not short of breath.

## 2015-12-29 NOTE — ED Provider Notes (Signed)
CSN: [id number]     Arrival date & time 12/29/15  [id number] History   First MD Initiated Contact with Patient 12/29/15 0730     Chief Complaint  Patient presents with  . Back Pain     (Consider location/radiation/quality/duration/timing/severity/associated sxs/prior Treatment) Patient is a 82 y.o. female presenting with back pain.  Back Pain Location:  Lumbar spine Quality:  Aching Radiates to:  Does not radiate Pain severity:  No pain Onset quality:  Gradual Duration:  3 weeks Timing:  Constant Progression:  Worsening Chronicity:  New Relieved by:  None tried Worsened by:  Nothing tried Ineffective treatments:  None tried Associated symptoms: weakness   Associated symptoms: no abdominal pain, no bladder incontinence, no bowel incontinence, no chest pain, no fever and no numbness     Past Medical History  Diagnosis Date  . History of TIA (transient ischemic attack)   . Hypothyroidism   . History of rheumatic fever   . Osteoporosis 04/2013    Thoracic compression fracture, on bisphosphonate and cal/vit D  . Paroxysmal atrial fibrillation (HCC)   . Mixed hyperlipidemia   . Essential hypertension, benign   . Diabetes type 2, controlled ([place])     Diet controlled  . Pulmonary nodules     Stable bilateral on CT 01/2010, rec rpt yearly for 2 yrs, pt decided to stop f/u  . Aortic sclerosis ([place])   . Anemia   . Arthritis   . History of skin cancer   . Anxiety   . Diverticulosis   . GERD (gastroesophageal reflux disease)   . Seasonal allergies   . History of pelvic fracture April 2013  . COPD (chronic obstructive pulmonary disease) ([place])   . Chronic gastritis 10/2009    Duodenitis on EGD  . Hiatal hernia 2011    Small  . Collagen vascular disease ([place])   . Glaucoma     Dr. Venetia Maxon  . Dry ARMD     Retinal hemorrhage (09/2013) [place]  . CKD (chronic kidney disease) stage 2, GFR 60-89 ml/min   . Macular degeneration   . Abnormal drug screen 06/2015   Past Surgical History   Procedure Laterality Date  . Tonsillectomy    . Cesarean section      (and h/o 6 miscarriages)  . Breast lumpectomy  1983    LEFT, benign  . Patella fracture surgery  05/2006    LEFT surgically repaired with pins and wire  . Dexa  05/2010    T score -4.5 spine, -2.4 femur; does not want to repeat  . Cataract extraction  2012    RIGHT  . Carotid US  2011    mild plaque formation  . Colonoscopy  03/2010    int hemorrhoids, diverticulosis, no need to repeat (Dr. Sydell Axon)  . Esophagogastroduodenoscopy  10/2009    small HH, multiple antric ulcerations s/p biopsy, mild chronic gastritis/duodenitis  . Esophagogastroduodenoscopy  05/26/2012    RMR: small HH, o/w normal.   . Dilation and curettage of uterus      x2  . Laparoscopic cholecystectomy  02/2013    Dr. Geroge Baseman  . Cholecystectomy N/A 02/27/2013    Procedure: LAPAROSCOPIC CHOLECYSTECTOMY;  Surgeon: Donato Heinz, MD;  Location: [facility];  Service: General;  Laterality: N/A;  . Dexa  04/2013    T score 3.9 [facility] spine, 2.6 hip   Family History  Problem Relation Age of Onset  . Coronary artery disease Mother 55    MI deceased  . Colon cancer  Father 45  . Arrhythmia Sister   . Stroke Paternal Grandmother   . Diabetes Neg Hx    Social History  Substance Use Topics  . Smoking status: Former Smoker -- 1.00 packs/day for 20 years    Types: Cigarettes    Quit date: 10/13/1998  . Smokeless tobacco: Never Used  . Alcohol Use: No   OB History    No data available     Review of Systems  Constitutional: Negative for fever.  Respiratory: Negative for cough, chest tightness and shortness of breath.   Cardiovascular: Positive for leg swelling. Negative for chest pain and palpitations.  Gastrointestinal: Negative for abdominal pain and bowel incontinence.  Genitourinary: Negative for bladder incontinence.  Musculoskeletal: Positive for back pain.  Neurological: Positive for weakness. Negative for numbness.  All other systems reviewed  and are negative.     Allergies  Propoxyphene n-acetaminophen; Codeine; Meperidine hcl; Morphine; and Shellfish allergy  Home Medications   Prior to Admission medications   Medication Sig Start Date End Date Taking? Authorizing Provider  acetaminophen (TYLENOL) 500 MG tablet Take 2 tablets (1,000 mg total) by mouth 3 (three) times daily. 12/23/15   Ria Bush, MD  albuterol (PROVENTIL HFA;VENTOLIN HFA) 108 (90 Base) MCG/ACT inhaler Inhale 2 puffs into the lungs every 6 (six) hours as needed for wheezing or shortness of breath. 10/20/15   Donne Hazel, MD  calcitonin, salmon, (MIACALCIN/FORTICAL) 200 UNIT/ACT nasal spray Place 1 spray into alternate nostrils daily. 12/23/15   Ria Bush, MD  Calcium-Magnesium-Vitamin D [phone] MG-MG-UNIT TABS Take 1 tablet by mouth 2 (two) times daily.    Historical Provider, MD  clonazePAM (KLONOPIN) 0.5 MG tablet Take 1 tablet (0.5 mg total) by mouth at bedtime as needed for anxiety. 12/17/15   Ria Bush, MD  felodipine (PLENDIL) 2.5 MG 24 hr tablet Take 1 tablet (2.5 mg total) by mouth daily. 12/12/15   Ria Bush, MD  furosemide (LASIX) 20 MG tablet Take 0.5 tablets (10 mg total) by mouth daily as needed for edema. 12/12/15   Ria Bush, MD  ibandronate (BONIVA) 3 MG/3ML SOLN injection Inject 3 mg into the vein every 3 (three) months.  04/05/12   Ria Bush, MD  latanoprost (XALATAN) 0.005 % ophthalmic solution Place 1 drop into both eyes at bedtime.     Historical Provider, MD  levothyroxine (SYNTHROID, LEVOTHROID) 50 MCG tablet TAKE 1 TABLET DAILY 06/24/15   Ria Bush, MD  Magnesium Oxide 500 MG (LAX) TABS Take 1 tablet by mouth 3 (three) times a week.    Historical Provider, MD  Multiple Vitamin (MULTIVITAMIN) capsule Take 1 capsule by mouth every morning.     Historical Provider, MD  Multiple Vitamins-Minerals (ICAPS AREDS FORMULA PO) Take 1 tablet by mouth daily.     Historical Provider, MD  Omega-3 Fatty Acids  (FISH OIL CONCENTRATE) 1000 MG CAPS Take 2 capsules by mouth 2 (two) times daily.      Historical Provider, MD  polyethylene glycol (MIRALAX / GLYCOLAX) packet Take 17 g by mouth daily. 11/15/15   Ria Bush, MD  predniSONE (DELTASONE) 20 MG tablet 3 tabs po daily x 3 days, then 2 tabs x 3 days, then 1.5 tabs x 3 days, then 1 tab x 3 days, then 0.5 tabs x 3 days 12/29/15   Merrily Pew, MD  sertraline (ZOLOFT) 50 MG tablet Take 1 tablet (50 mg total) by mouth daily. 11/18/15   Ria Bush, MD  traMADol (ULTRAM) 50 MG tablet Take tramadol 50mg   mid day and 25mg  at bedtime 12/23/15   Ria Bush, MD  vitamin B-12 (CYANOCOBALAMIN) 500 MCG tablet Take 500 mcg by mouth daily.    Historical Provider, MD  warfarin (COUMADIN) 2.5 MG tablet Take 2.5 mg by mouth daily.     Historical Provider, MD   BP 142/75 mmHg  Pulse 95  Temp(Src) 98.2 F (36.8 C) (Oral)  Resp 17  Ht 5\' 6"  (1.676 m)  Wt 120 lb (54.432 kg)  BMI 19.38 kg/m2  SpO2 93% Physical Exam  Constitutional: She is oriented to person, place, and time. She appears well-developed and well-nourished.  HENT:  Head: Normocephalic and atraumatic.  Neck: Normal range of motion.  Cardiovascular: Normal rate and regular rhythm.   Pulmonary/Chest: Effort normal. No stridor. No respiratory distress. She has no wheezes. She has rales (mild).  Abdominal: Soft. She exhibits no distension. There is no tenderness.  Musculoskeletal: Normal range of motion. She exhibits no edema or tenderness.  Neurological: She is alert and oriented to person, place, and time.  Skin: Skin is warm and dry.  Nursing note and vitals reviewed.   ED Course  Procedures (including critical care time) Labs Review Labs Reviewed  URINALYSIS, ROUTINE W REFLEX MICROSCOPIC (NOT AT [facility]) - Abnormal; Notable for the following:    Specific Gravity, Urine <1.005 (*)    All other components within normal limits  BASIC METABOLIC PANEL - Abnormal; Notable for the following:     Potassium 3.3 (*)    Glucose, Bld 112 (*)    Calcium 7.9 (*)    Anion gap 4 (*)    All other components within normal limits  CBC WITH DIFFERENTIAL/PLATELET - Abnormal; Notable for the following:    RBC 3.35 (*)    Hemoglobin 10.4 (*)    HCT 31.8 (*)    RDW 16.4 (*)    All other components within normal limits  BRAIN NATRIURETIC PEPTIDE - Abnormal; Notable for the following:    B Natriuretic Peptide 584.0 (*)    All other components within normal limits  TROPONIN I    Imaging Review Dg Chest 2 View  12/29/2015  CLINICAL DATA:  Back pain.  Evaluate for fluid. EXAM: CHEST  2 VIEW COMPARISON:  11/17/2015 FINDINGS: Lateral view degraded by patient arm position. Osteopenia. Mid and lower thoracic mild compression deformities are similar. Midline trachea. Mild cardiomegaly with atherosclerosis in the thoracic aorta. Bilateral costophrenic angle blunting is favored related to pleural thickening. No pneumothorax. Moderate pulmonary interstitial thickening is chronic. No lobar consolidation. IMPRESSION: Cardiomegaly and chronic interstitial thickening, likely the sequelae of smoking/ chronic bronchitis. Aortic atherosclerosis. Electronically Signed   By: Abigail Miyamoto M.D.   On: 12/29/2015 11:25   Ct Renal Stone Study  12/29/2015  CLINICAL DATA:  82 year old female with lower back pain radiating down the right leg for several weeks but progressive over the last day. Patient has a known and recently diagnosed acute/subacute compression fracture at L3. EXAM: CT ABDOMEN AND PELVIS WITHOUT CONTRAST TECHNIQUE: Multidetector CT imaging of the abdomen and pelvis was performed following the standard protocol without IV contrast. COMPARISON:  MRI lumbar spine 12/21/2015 ; recent prior CT abdomen/pelvis 12/19/2015 FINDINGS: Lower chest: Trace bilateral pleural effusions and associated bibasilar atelectasis. Diffuse mild lower lobe bronchial wall thickening. Centrilobular emphysema. Incompletely imaged  cardiomegaly. Coronary artery calcifications. No pericardial effusion. Small hiatal hernia. Hepatobiliary: No mass visualized on this un-enhanced exam. The gallbladder is surgically absent. Pancreas: No mass or inflammatory process identified on this un-enhanced exam.  Spleen: Within normal limits in size. Adrenals/Urinary Tract: No evidence of urolithiasis or hydronephrosis. No definite mass visualized on this un-enhanced exam. Low-attenuation lesions have not changed since the recent prior imaging in remain highly likely to represent benign cysts. Stomach/Bowel: No evidence of obstruction, inflammatory process, or abnormal fluid collections. Vascular/Lymphatic: No pathologically enlarged lymph nodes. Aortic calcifications. Aneurysmal dilatation of the distal infrarenal segment just above the bifurcation with focal outpouching posteriorly. The maximal aortic diameter measures approximately 3.2 cm which is unchanged compared to prior. Reproductive: No mass or other significant abnormality. Other: None. Musculoskeletal: Stable compression fracture of the inferior endplate of L3. No significant progression of height loss compared to recent prior imaging. Stable remote L4, T12 and L1 compression fractures. T10 compression fracture also appears chronic although there is no definite prior imaging for comparison. Healed fracture of the left inferior pubic ramus. Multilevel degenerative disc disease and lower lumbar facet arthropathy without interval progression. IMPRESSION: 1. No new acute findings in the abdomen or pelvis compared to recent prior imaging. 2. Persistent trace bilateral pleural effusions and associated lower lobe atelectasis with mild bronchial wall thickening. 3. Coronary artery calcifications. 4. Stable 3.2 cm infrarenal abdominal aortic aneurysm. Recommend followup by ultrasound in 3 years. This recommendation follows ACR consensus guidelines: White Paper of the ACR Incidental Findings Committee II on  Vascular Findings. J Am Coll Radiol 2013; 10:789-794. 5. Multiple unchanged compression fractures including known acute/subacute inferior endplate fracture at L3 and chronic fractures at L4, T12 and L1. A T10 compression fracture also appears chronic although there is no definite prior imaging for comparison. 6. Additional ancillary findings as above without significant interval change. Electronically Signed   By: Jacqulynn Cadet M.D.   On: 12/29/2015 09:05   I have personally reviewed and evaluated these images and lab results as part of my medical decision-making.   EKG Interpretation None      MDM   Final diagnoses:  Bilateral low back pain with left-sided sciatica  Hypervolemia, unspecified hypervolemia type    82 yo F w/ known acute/subacute/chronic back fractures here with acute on chronic pain exacerbation c/w likely sciatica. No new neuro deficits. No urinary/metabolic abnormalities. Ambulates after medicine for MSK strain.   Intermittent hypoxia here with tachypnea or dyspnea. H/o chf so cxr, ecg, troponin and bnp added on and slightly eelvated BNP without other abnormalities. Patient without dyspnea, so will increase lasix and fu w/ pcp.   New Prescriptions: Discharge Medication List as of 12/29/2015 12:25 PM       I have personally and contemperaneously reviewed labs and imaging and used in my decision making as above.   A medical screening exam was performed and I feel the patient has had an appropriate workup for their chief complaint at this time and likelihood of emergent condition existing is low and thus workup can continue on an outpatient basis.. Their vital signs are stable. They have been counseled on decision, discharge, follow up and which symptoms necessitate immediate return to the emergency department.  They verbally stated understanding and agreement with plan and discharged in stable condition.      Merrily Pew, MD 12/29/15 [phone]

## 2015-12-29 NOTE — ED Notes (Signed)
Pt brought in by [facility] c/o lower back pain that radiates down the right leg x several weeks. Pt reports the pain has progressively gotten worse throughout the night. Pt denies injury.

## 2015-12-29 NOTE — Discharge Instructions (Signed)
You have slightly increased fluid around your lungs and in your legs. Your oxygen is slightly low as well, but likely normal for your diseases. Please take twice your normal dose of Lasix, 0.5 tablets twice a day, for one week and follow up with your primary doctor. At any point you have chest pain, shortness of breath please return to the ED for further evaluation and workup.

## 2015-12-29 NOTE — ED Notes (Signed)
After medications, patient lethargic. Oxygen sats decreased to 70s on RA. Placed on [place] 2 liters with improvement to 88%. Increased O2 to 3 liters. Now maintaining oxygen sats at 96%. MD notified.

## 2015-12-29 NOTE — ED Notes (Signed)
Patient returned to sitting position and taken off o2 to see if oxygenation improves. Oxygen increased from 86% on RA lying to 94% on RA sitting after several minutes.

## 2015-12-29 NOTE — ED Notes (Signed)
MD at bedside. 

## 2015-12-30 ENCOUNTER — Telehealth: Payer: Self-pay | Admitting: *Deleted

## 2015-12-30 NOTE — Telephone Encounter (Signed)
Pt cancelled dental extraction because she has been back in the ED and she doesn't feel up to it.  Has been off coumadin 4 days.  Told pt to take coumadin 5mg  tonight and tomorrow night then resume 2.5mg  daily except 3.75mg  on Mondays and come for INR check on 01/06/16.  She verbalized understanding.

## 2015-12-30 NOTE — Telephone Encounter (Signed)
Seen at ER over weekend. Has f/u in office tomorrow.  We have referred her to spine to discuss possible kyphoplasty.

## 2015-12-30 NOTE — Telephone Encounter (Signed)
[facility] Patient Name: [name] Gender: Female DOB: [birth year] Age: 82 Y 67 M 26 D Return Phone Number: [phone] (Primary) Address: City/State/Zip: [name] Client [name] Night - Client Client Site [place] Physician [name] - MD Contact Type Call Who Is Calling Patient / Member / Family / Caregiver Call Type Triage / Clinical Relationship To Patient Self Return Phone Number [phone] (Primary) Chief Complaint Back Pain - General Reason for Call Symptomatic / Request for [name] states she has pain in her back. It hurts to move and she can not get out of bed. PreDisposition InappropriateToAsk Translation No Nurse Assessment Nurse: Ysidro Evert, RN, Shirlean Mylar Date/Time (Eastern Time): 12/29/2015 6:30:13 AM Confirm and document reason for call. If symptomatic, describe symptoms. You must click the next button to save text entered. ---Caller states she has pain in her back which is going on for two weeks. It hurts to move and she can not get out of bed. If she moves, she gets this terrible grabbing pain. Takes Tylenol and Tramadol. Had last does of Tylenol at 10 p.m. last night. It's too painful to get out of bed. Lives with husband. Has the patient traveled out of the country within the last 30 days? ---No Does the patient have any new or worsening symptoms? ---Yes Will a triage be completed? ---Yes Related visit to physician within the last 2 weeks? ---Yes Does the PT have any chronic conditions? (i.e. diabetes, asthma, etc.) ---Yes List chronic conditions. ---Chronic back pain, borderline DM, HTN, afib, Is this a behavioral health or substance abuse call? ---No Guidelines Guideline Title Affirmed Question Affirmed Notes Nurse Date/Time (Eastern Time) Back Pain Unable to walk  Ysidro Evert, Therapist, sports, Robin 12/29/2015 6:33:48 AM Disp. Time [name] Time) Disposition Final User 12/29/2015 6:37:35 AM Go to ED Now (or PCP triage) Yes Ysidro Evert, RN, Shirlean Mylar PLEASE NOTE: All timestamps contained within this report are represented as [place] Standard Time. CONFIDENTIALTY NOTICE: This fax transmission is intended only for the addressee. It contains information that is legally privileged, confidential or otherwise protected from use or disclosure. If you are not the intended recipient, you are strictly prohibited from reviewing, disclosing, copying using or disseminating any of this information or taking any action in reliance on or regarding this information. If you have received this fax in error, please notify us immediately by telephone so that we can arrange for its return to [place]. Phone: [phone], Toll-Free: [phone], Fax: [phone] Page: 2 of 2 Call Id: [id number] Caller Understands: Yes Disagree/Comply: Comply Care Advice Given Per Guideline GO TO ED NOW (OR PCP TRIAGE): AMBULANCE TRANSPORT: If the patient cannot walk at all because of the severity of the back pain or because of significant leg weakness, then the patient will need to be transported via ambulance and examined at the emergency department. The patient or family members can arrange ambulance transport via private ambulance company or via EMS 911. CARE ADVICE given per Back Pain (Adult) guideline. DRIVING: Another adult should drive. * IF NO PCP TRIAGE: You need to be seen. Go to the [facility] at _[facility] Hospital within the next hour. Leave as soon as you can. Referrals [facility] - ED [facility] - ED

## 2015-12-30 NOTE — Telephone Encounter (Signed)
Pt's dental surgery has been canceled and she's been off her coumadin, she would like to know what she needs to do now.

## 2015-12-31 ENCOUNTER — Ambulatory Visit (INDEPENDENT_AMBULATORY_CARE_PROVIDER_SITE_OTHER): Payer: Medicare Other | Admitting: Family Medicine

## 2015-12-31 ENCOUNTER — Encounter: Payer: Self-pay | Admitting: Family Medicine

## 2015-12-31 VITALS — BP 136/76 | HR 68 | Temp 97.7°F | Wt 122.2 lb

## 2015-12-31 DIAGNOSIS — I509 Heart failure, unspecified: Secondary | ICD-10-CM

## 2015-12-31 DIAGNOSIS — IMO0001 Reserved for inherently not codable concepts without codable children: Secondary | ICD-10-CM

## 2015-12-31 DIAGNOSIS — M4850XG Collapsed vertebra, not elsewhere classified, site unspecified, subsequent encounter for fracture with delayed healing: Secondary | ICD-10-CM | POA: Diagnosis not present

## 2015-12-31 DIAGNOSIS — I50810 Right heart failure, unspecified: Secondary | ICD-10-CM

## 2015-12-31 DIAGNOSIS — M810 Age-related osteoporosis without current pathological fracture: Secondary | ICD-10-CM

## 2015-12-31 MED ORDER — FUROSEMIDE 20 MG PO TABS: 40.0000 mg | ORAL_TABLET | Freq: Every day | ORAL | Status: DC

## 2015-12-31 MED ORDER — HYDROCODONE-ACETAMINOPHEN 5-325 MG PO TABS: 0.5000 | ORAL_TABLET | Freq: Three times a day (TID) | ORAL | Status: DC | PRN

## 2015-12-31 NOTE — Addendum Note (Signed)
Addended by: Ria Bush on: 12/31/2015 02:41 PM   Modules accepted: Orders

## 2015-12-31 NOTE — Assessment & Plan Note (Signed)
Bisphosphonate on hold over last 6 months due to planned dental work. Calcitonin recently started for pain control of osteoporotic fracture with resultant hypocalcemia - discussed importance of daily calcium intake - through supplements and diet.

## 2015-12-31 NOTE — Assessment & Plan Note (Signed)
With hypoxia and recent pedal edema, ER has increased lasix to 40mg  daily - agree with this and continue for next 2 weeks. Recheck labs at f/u visit.

## 2015-12-31 NOTE — Progress Notes (Signed)
Pre visit review using our clinic review tool, if applicable. No additional management support is needed unless otherwise documented below in the visit note. 

## 2015-12-31 NOTE — Progress Notes (Signed)
BP 136/76 mmHg  Pulse 68  Temp(Src) 97.7 F (36.5 C) (Oral)  Wt 122 lb 4 oz (55.452 kg)   CC: f/u visit  Subjective:    Patient ID: [name], female    DOB: 12-03-1925, 82 y.o.   MRN: [id number]  HPI: [name] is a 82 y.o. female presenting on 12/31/2015 for Follow-up   Recent ER evaluation for worsening lumbar back pain with sciatica. Known acute/subacute/chronic spine fractures.   Was also found to be intermittently hypoxic with exertion, known CHF and COPD. EKG, troponin, BNP checked. Lasix was increased to 40mg  daily and patient was advised to f/u with PCP. She feels she's making good urine.   Intermittent lower back pain today much worse than yesterday, as well as sudden sharp stabbing pains worse with any bend of spine. Constant pain is tolerable with tylenol/tramadol combination. Last week she started calcitonin nasal spray.   From phone note 12/23/2015: CT abd/pelvis w/ contrast 12/2015:  1. Enlarged heart, thoracic/abd aortic ATH and tortuosity  2. Stable 1.1cm LLL nodule 3. R heart failure (dilated hepatic veins, IVC) 4. Kidney cysts 5. Slight thickening of stomach antrum, duodenal diverticulum with air fluid level 6. Osteopenia with diffuse degeneration of spine, pelvis, hips, chronic appearing T12/L4 compression fractures  MRI lumbar spine without contrast 12/2015: 1. Progression of L4 vertebral fracture since 4/25 - essentially healed 2. Healed L1/T12 fractures as well 3. New L3 fracture 4. Diffuse degenerative disc disease, broad bulging L3/4 with mod bilat foraminal stenosis  Start calcitonin nasal spray, refer to spine to discuss possible kyphoplasty benefits. Appt pending 01/14/2016.   Relevant past medical, surgical, family and social history reviewed and updated as indicated. Interim medical history since our last visit reviewed. Allergies and medications reviewed and updated. Current Outpatient Prescriptions on File Prior to Visit    Medication Sig  . acetaminophen (TYLENOL) 500 MG tablet Take 2 tablets (1,000 mg total) by mouth 3 (three) times daily.  Marland Kitchen albuterol (PROVENTIL HFA;VENTOLIN HFA) 108 (90 Base) MCG/ACT inhaler Inhale 2 puffs into the lungs every 6 (six) hours as needed for wheezing or shortness of breath.  . calcitonin, salmon, (MIACALCIN/FORTICAL) 200 UNIT/ACT nasal spray Place 1 spray into alternate nostrils daily.  . Calcium-Magnesium-Vitamin D [id number] MG-MG-UNIT TABS Take 1 tablet by mouth 2 (two) times daily.  . clonazePAM (KLONOPIN) 0.5 MG tablet Take 1 tablet (0.5 mg total) by mouth at bedtime as needed for anxiety.  . felodipine (PLENDIL) 2.5 MG 24 hr tablet Take 1 tablet (2.5 mg total) by mouth daily.  Marland Kitchen ibandronate (BONIVA) 3 MG/3ML SOLN injection Inject 3 mg into the vein every 3 (three) months.   . latanoprost (XALATAN) 0.005 % ophthalmic solution Place 1 drop into both eyes at bedtime.   Marland Kitchen levothyroxine (SYNTHROID, LEVOTHROID) 50 MCG tablet TAKE 1 TABLET DAILY  . Magnesium Oxide 500 MG (LAX) TABS Take 1 tablet by mouth 3 (three) times a week.  . Multiple Vitamin (MULTIVITAMIN) capsule Take 1 capsule by mouth every morning.   . Multiple Vitamins-Minerals (ICAPS AREDS FORMULA PO) Take 1 tablet by mouth daily.   . Omega-3 Fatty Acids (FISH OIL CONCENTRATE) 1000 MG CAPS Take 2 capsules by mouth 2 (two) times daily.    . polyethylene glycol (MIRALAX / GLYCOLAX) packet Take 17 g by mouth daily.  . predniSONE (DELTASONE) 20 MG tablet 3 tabs po daily x 3 days, then 2 tabs x 3 days, then 1.5 tabs x 3 days, then 1 tab x 3  days, then 0.5 tabs x 3 days  . sertraline (ZOLOFT) 50 MG tablet Take 1 tablet (50 mg total) by mouth daily.  . traMADol (ULTRAM) 50 MG tablet Take tramadol 50mg  mid day and 25mg  at bedtime  . vitamin B-12 (CYANOCOBALAMIN) 500 MCG tablet Take 500 mcg by mouth daily.  Marland Kitchen warfarin (COUMADIN) 2.5 MG tablet Take 2.5 mg by mouth daily.   . [DISCONTINUED] Calcium Carbonate-Vitamin D (CALCIUM  PLUS VITAMIN D PO) Take 2 tablets by mouth daily.     No current facility-administered medications on file prior to visit.    Review of Systems Per HPI unless specifically indicated in ROS section     Objective:    BP 136/76 mmHg  Pulse 68  Temp(Src) 97.7 F (36.5 C) (Oral)  Wt 122 lb 4 oz (55.452 kg)  Wt Readings from Last 3 Encounters:  12/31/15 122 lb 4 oz (55.452 kg)  12/29/15 120 lb (54.432 kg)  12/17/15 125 lb 8 oz (56.926 kg)    Physical Exam  Constitutional: She appears well-developed and well-nourished. No distress.  Cardiovascular: Normal rate, regular rhythm and intact distal pulses.   No murmur heard. Pulmonary/Chest: Effort normal and breath sounds normal. No respiratory distress. She has no wheezes. She has no rales.  Musculoskeletal: She exhibits edema (1+ pitting).  Point tender to palpation mid lumbar spine  Nursing note and vitals reviewed.  Results for orders placed or performed during the hospital encounter of 12/29/15  Urinalysis, Routine w reflex microscopic (not at [facility])  Result Value Ref Range   Color, Urine YELLOW YELLOW   APPearance CLEAR CLEAR   Specific Gravity, Urine <1.005 (L) 1.005 - 1.030   pH 7.0 5.0 - 8.0   Glucose, UA NEGATIVE NEGATIVE mg/dL   Hgb urine dipstick NEGATIVE NEGATIVE   Bilirubin Urine NEGATIVE NEGATIVE   Ketones, ur NEGATIVE NEGATIVE mg/dL   Protein, ur NEGATIVE NEGATIVE mg/dL   Nitrite NEGATIVE NEGATIVE   Leukocytes, UA NEGATIVE NEGATIVE  Basic metabolic panel  Result Value Ref Range   Sodium 138 135 - 145 mmol/L   Potassium 3.3 (L) 3.5 - 5.1 mmol/L   Chloride 108 101 - 111 mmol/L   CO2 26 22 - 32 mmol/L   Glucose, Bld 112 (H) 65 - 99 mg/dL   BUN 10 6 - 20 mg/dL   Creatinine, Ser 0.50 0.44 - 1.00 mg/dL   Calcium 7.9 (L) 8.9 - 10.3 mg/dL   GFR calc non Af Amer >60 >60 mL/min   GFR calc Af Amer >60 >60 mL/min   Anion gap 4 (L) 5 - 15  CBC with Differential  Result Value Ref Range   WBC 8.9 4.0 - 10.5 K/uL   RBC  3.35 (L) 3.87 - 5.11 MIL/uL   Hemoglobin 10.4 (L) 12.0 - 15.0 g/dL   HCT 31.8 (L) 36.0 - 46.0 %   MCV 94.9 78.0 - 100.0 fL   MCH 31.0 26.0 - 34.0 pg   MCHC 32.7 30.0 - 36.0 g/dL   RDW 16.4 (H) 11.5 - 15.5 %   Platelets 362 150 - 400 K/uL   Neutrophils Relative % 70 %   Neutro Abs 6.2 1.7 - 7.7 K/uL   Lymphocytes Relative 17 %   Lymphs Abs 1.5 0.7 - 4.0 K/uL   Monocytes Relative 12 %   Monocytes Absolute 1.0 0.1 - 1.0 K/uL   Eosinophils Relative 2 %   Eosinophils Absolute 0.1 0.0 - 0.7 K/uL   Basophils Relative 1 %   Basophils  Absolute 0.1 0.0 - 0.1 K/uL  Brain natriuretic peptide  Result Value Ref Range   B Natriuretic Peptide 584.0 (H) 0.0 - 100.0 pg/mL  Troponin I  Result Value Ref Range   Troponin I <0.03 <0.031 ng/mL   Lab Results  Component Value Date   INR 2.9 12/23/2015   INR 2.8* 12/17/2015   INR 4.4 12/09/2015        Assessment & Plan:   Problem List Items Addressed This Visit    Osteoporosis    Bisphosphonate on hold over last 6 months due to planned dental work. Calcitonin recently started for pain control of osteoporotic fracture with resultant hypocalcemia - discussed importance of daily calcium intake - through supplements and diet.       Right heart failure, NYHA class 3 ([place])    With hypoxia and recent pedal edema, ER has increased lasix to 40mg  daily - agree with this and continue for next 2 weeks. Recheck labs at f/u visit.       Relevant Medications   furosemide (LASIX) 20 MG tablet   Compression fracture of vertebrae (HCC) - Primary    Anticipate majority of pain stemming from acute L3 fracture seen on recent MRI. We have started calcitonin nasal spray, continue tramadol/tylenol scheduled. Will prescribe hydrocodone for breakthrough pain - pt may try 1/2 tab in place of tramadol to see which is more effective for pain control. Await neurosurgical consult for possible kyphoplasty.           Follow up plan: Return in about 2 weeks (around  01/14/2016), or if symptoms worsen or fail to improve, for follow up visit.  Ria Bush, MD

## 2015-12-31 NOTE — Assessment & Plan Note (Signed)
Anticipate majority of pain stemming from acute L3 fracture seen on recent MRI. We have started calcitonin nasal spray, continue tramadol/tylenol scheduled. Will prescribe hydrocodone for breakthrough pain - pt may try 1/2 tab in place of tramadol to see which is more effective for pain control. Await neurosurgical consult for possible kyphoplasty.

## 2015-12-31 NOTE — Patient Instructions (Addendum)
Continue calcitonin nasal spray, we will await evaluation by Dr Cyndy Freeze. Continue tylenol/tramadol as up to now, may try hydrocodone pain medicine 1/2 tablet in place of tramadol.  Update me with effect.  Regularly take calcium and vitamin D. Increase dairy products.

## 2016-01-02 ENCOUNTER — Telehealth: Payer: Self-pay | Admitting: Family Medicine

## 2016-01-02 NOTE — Telephone Encounter (Signed)
The new pain control routine is working well.  She wanted Dr. Danise Mina to know this.  She left her phone number but did not necessary specify that she needed a call back.

## 2016-01-03 NOTE — Telephone Encounter (Signed)
noted  Thanks

## 2016-01-06 ENCOUNTER — Ambulatory Visit (INDEPENDENT_AMBULATORY_CARE_PROVIDER_SITE_OTHER): Payer: Medicare Other | Admitting: *Deleted

## 2016-01-06 DIAGNOSIS — Z5181 Encounter for therapeutic drug level monitoring: Secondary | ICD-10-CM | POA: Diagnosis not present

## 2016-01-06 DIAGNOSIS — Z7901 Long term (current) use of anticoagulants: Secondary | ICD-10-CM

## 2016-01-06 DIAGNOSIS — I4819 Other persistent atrial fibrillation: Secondary | ICD-10-CM

## 2016-01-06 DIAGNOSIS — G459 Transient cerebral ischemic attack, unspecified: Secondary | ICD-10-CM

## 2016-01-06 DIAGNOSIS — I4891 Unspecified atrial fibrillation: Secondary | ICD-10-CM | POA: Diagnosis not present

## 2016-01-06 DIAGNOSIS — I481 Persistent atrial fibrillation: Secondary | ICD-10-CM

## 2016-01-06 LAB — POCT INR: INR: 2.6

## 2016-01-10 ENCOUNTER — Telehealth: Payer: Self-pay | Admitting: Family Medicine

## 2016-01-10 NOTE — Telephone Encounter (Signed)
Pt called to let you know  For pain she has stopped taking everything except tylenol Pt stated she is doing good

## 2016-01-12 NOTE — Telephone Encounter (Signed)
Good to hear. Thank you.

## 2016-01-14 DIAGNOSIS — S32030A Wedge compression fracture of third lumbar vertebra, initial encounter for closed fracture: Secondary | ICD-10-CM | POA: Diagnosis not present

## 2016-01-16 ENCOUNTER — Encounter: Payer: Self-pay | Admitting: Family Medicine

## 2016-01-16 ENCOUNTER — Ambulatory Visit (INDEPENDENT_AMBULATORY_CARE_PROVIDER_SITE_OTHER): Payer: Medicare Other | Admitting: Family Medicine

## 2016-01-16 VITALS — BP 102/70 | HR 92 | Temp 98.2°F | Wt 115.1 lb

## 2016-01-16 DIAGNOSIS — D649 Anemia, unspecified: Secondary | ICD-10-CM

## 2016-01-16 DIAGNOSIS — M810 Age-related osteoporosis without current pathological fracture: Secondary | ICD-10-CM | POA: Diagnosis not present

## 2016-01-16 DIAGNOSIS — IMO0001 Reserved for inherently not codable concepts without codable children: Secondary | ICD-10-CM

## 2016-01-16 DIAGNOSIS — M4850XG Collapsed vertebra, not elsewhere classified, site unspecified, subsequent encounter for fracture with delayed healing: Secondary | ICD-10-CM | POA: Diagnosis not present

## 2016-01-16 DIAGNOSIS — D509 Iron deficiency anemia, unspecified: Secondary | ICD-10-CM | POA: Insufficient documentation

## 2016-01-16 LAB — BASIC METABOLIC PANEL
BUN: 18 mg/dL (ref 6–23)
CALCIUM: 10.2 mg/dL (ref 8.4–10.5)
CHLORIDE: 103 meq/L (ref 96–112)
CO2: 31 meq/L (ref 19–32)
CREATININE: 0.66 mg/dL (ref 0.40–1.20)
GFR: 89.76 mL/min (ref >60.00)
GLUCOSE: 97 mg/dL (ref 70–99)
Potassium: 3.1 mEq/L — ABNORMAL LOW (ref 3.5–5.1)
Sodium: 143 mEq/L (ref 135–145)

## 2016-01-16 LAB — IBC PANEL
Iron: 41 ug/dL — ABNORMAL LOW (ref 42–145)
Saturation Ratios: 10.7 % — ABNORMAL LOW (ref 20.0–50.0)
Transferrin: 273 mg/dL (ref 212.0–360.0)

## 2016-01-16 LAB — FERRITIN: Ferritin: 102.5 ng/mL (ref 10.0–291.0)

## 2016-01-16 NOTE — Progress Notes (Signed)
Pre visit review using our clinic review tool, if applicable. No additional management support is needed unless otherwise documented below in the visit note. 

## 2016-01-16 NOTE — Patient Instructions (Addendum)
Keep July follow up appointment.  Labs today.  Continue lower lasix dose as needed.

## 2016-01-16 NOTE — Assessment & Plan Note (Signed)
Check iron panel today. 

## 2016-01-16 NOTE — Assessment & Plan Note (Signed)
Saw Cabbell, rec against kyphoplasty.  Continues lumbar support brace.  Off narcotics, only taking tylenol 1000mg  TID.

## 2016-01-16 NOTE — Assessment & Plan Note (Signed)
Bisphophonate on hold. Taking nasal calcitonin. Also taking calcium/vit D twice daily regularly.

## 2016-01-16 NOTE — Progress Notes (Signed)
BP 102/70 mmHg  Pulse 92  Temp(Src) 98.2 F (36.8 C)  Wt 115 lb 1.9 oz (52.218 kg)  SpO2 93%   CC: 2 wk f/u visit Subjective:    Patient ID: [name], female    DOB: [birth year], 82 y.o.   MRN: [id number]  HPI: [name] is a 82 y.o. female presenting on 01/16/2016 for Follow-up   See prior note for details. Intermittent lower back pain due to acute and chronic compression fractures + spondylosis. We started calcitonin nasal spray and referred her to spine to discuss possible kyphoplasty. We also prescribed hydrocodone in place of tramadol - this has significantly helped.   Saw Dr Christella Noa on Tuesday - rec against kyphoplasty as other prior compression fractures have improved on their own. Placed in lumbar brace.   Hypocalcemia with calcitonin - recheck labs today.  Lasix increased to 40mg  daily at recent ER visit with elevated BNP (with hypoxia and pedal edema).   Relevant past medical, surgical, family and social history reviewed and updated as indicated. Interim medical history since our last visit reviewed. Allergies and medications reviewed and updated. Current Outpatient Prescriptions on File Prior to Visit  Medication Sig  . acetaminophen (TYLENOL) 500 MG tablet Take 2 tablets (1,000 mg total) by mouth 3 (three) times daily.  . calcitonin, salmon, (MIACALCIN/FORTICAL) 200 UNIT/ACT nasal spray Place 1 spray into alternate nostrils daily.  . Calcium-Magnesium-Vitamin D [id number] MG-MG-UNIT TABS Take 1 tablet by mouth 2 (two) times daily.  . clonazePAM (KLONOPIN) 0.5 MG tablet Take 1 tablet (0.5 mg total) by mouth at bedtime as needed for anxiety.  . felodipine (PLENDIL) 2.5 MG 24 hr tablet Take 1 tablet (2.5 mg total) by mouth daily.  . furosemide (LASIX) 20 MG tablet Take 2 tablets (40 mg total) by mouth daily. (Patient taking differently: Take 40 mg by mouth daily. Pt takes 1/2 a tablet as needed)  . ibandronate (BONIVA) 3 MG/3ML SOLN injection Inject 3 mg  into the vein every 3 (three) months.   . latanoprost (XALATAN) 0.005 % ophthalmic solution Place 1 drop into both eyes at bedtime.   Marland Kitchen levothyroxine (SYNTHROID, LEVOTHROID) 50 MCG tablet TAKE 1 TABLET DAILY  . Magnesium Oxide 500 MG (LAX) TABS Take 1 tablet by mouth 3 (three) times a week.  . Multiple Vitamin (MULTIVITAMIN) capsule Take 1 capsule by mouth every morning.   . Multiple Vitamins-Minerals (ICAPS AREDS FORMULA PO) Take 1 tablet by mouth daily.   . Omega-3 Fatty Acids (FISH OIL CONCENTRATE) 1000 MG CAPS Take 2 capsules by mouth 2 (two) times daily.    . polyethylene glycol (MIRALAX / GLYCOLAX) packet Take 17 g by mouth daily.  . sertraline (ZOLOFT) 50 MG tablet Take 1 tablet (50 mg total) by mouth daily.  . vitamin B-12 (CYANOCOBALAMIN) 500 MCG tablet Take 500 mcg by mouth daily.  Marland Kitchen warfarin (COUMADIN) 2.5 MG tablet Take 2.5 mg by mouth daily.   . [DISCONTINUED] Calcium Carbonate-Vitamin D (CALCIUM PLUS VITAMIN D PO) Take 2 tablets by mouth daily.     No current facility-administered medications on file prior to visit.    Review of Systems Per HPI unless specifically indicated in ROS section     Objective:    BP 102/70 mmHg  Pulse 92  Temp(Src) 98.2 F (36.8 C)  Wt 115 lb 1.9 oz (52.218 kg)  SpO2 93%  Wt Readings from Last 3 Encounters:  01/16/16 115 lb 1.9 oz (52.218 kg)  12/31/15 122 lb 4 oz (  55.452 kg)  12/29/15 120 lb (54.432 kg)    Physical Exam  Constitutional: She appears well-developed and well-nourished. No distress.  HENT:  Mouth/Throat: Oropharynx is clear and moist. No oropharyngeal exudate.  Cardiovascular: Normal rate, regular rhythm, normal heart sounds and intact distal pulses.   No murmur heard. Pulmonary/Chest: Effort normal and breath sounds normal. No respiratory distress. She has no wheezes. She has no rales.  Musculoskeletal: She exhibits no edema.  Lumbar support brace in place  Skin: Skin is warm and dry. No rash noted.  Psychiatric: She  has a normal mood and affect.  Nursing note and vitals reviewed.  Results for orders placed or performed in visit on 01/06/16  POCT INR  Result Value Ref Range   INR 2.6    MRI LUMBAR SPINE WITHOUT CONTRAST IMPRESSION: 1. Acute/subacute inferior endplate compression fracture at L3 with approximately 25% loss of height. There is no significant retropulsed bone although a broad-based disc protrusion is present at L3-4. 2. Superior endplate fracture at L4 is near completely healed. 3. Remote superior endplate fracture at 624THL and inferior endplate fracture at L1. 4. Leftward disc bulging and facet hypertrophy at L1-2 and L2-3 without significant stenosis. 5. Broad-based disc protrusion and facet hypertrophy leads to mil  bilateral subarticular moderate bilateral foraminal stenosis at L3-4. 6. Mild subarticular and bilateral foraminal stenosis at L4-5. 7. Mild facet hypertrophy without significant disc protrusion or stenosis at L5-S1. Electronically Signed  By: San Morelle M.D.  On: 12/21/2015 15:35    Assessment & Plan:   Problem List Items Addressed This Visit    Osteoporosis - Primary    Bisphophonate on hold. Taking nasal calcitonin. Also taking calcium/vit D twice daily regularly.       Compression fracture of vertebrae (HCC)    Saw Cabbell, rec against kyphoplasty.  Continues lumbar support brace.  Off narcotics, only taking tylenol 1000mg  TID.       Anemia, unspecified    Check iron panel today.       Relevant Orders   Ferritin   IBC panel    Other Visit Diagnoses    Hypocalcemia        Relevant Orders    Basic metabolic panel        Follow up plan: Return if symptoms worsen or fail to improve.  Ria Bush, MD

## 2016-01-20 ENCOUNTER — Other Ambulatory Visit: Payer: Self-pay | Admitting: Family Medicine

## 2016-01-20 DIAGNOSIS — D509 Iron deficiency anemia, unspecified: Secondary | ICD-10-CM

## 2016-01-20 MED ORDER — POTASSIUM CHLORIDE CRYS ER 20 MEQ PO TBCR: 20.0000 meq | EXTENDED_RELEASE_TABLET | Freq: Every day | ORAL | Status: DC | PRN

## 2016-01-22 ENCOUNTER — Ambulatory Visit (INDEPENDENT_AMBULATORY_CARE_PROVIDER_SITE_OTHER): Payer: Medicare Other | Admitting: *Deleted

## 2016-01-22 DIAGNOSIS — Z5181 Encounter for therapeutic drug level monitoring: Secondary | ICD-10-CM

## 2016-01-22 DIAGNOSIS — Z7901 Long term (current) use of anticoagulants: Secondary | ICD-10-CM | POA: Diagnosis not present

## 2016-01-22 DIAGNOSIS — G459 Transient cerebral ischemic attack, unspecified: Secondary | ICD-10-CM

## 2016-01-22 DIAGNOSIS — I4891 Unspecified atrial fibrillation: Secondary | ICD-10-CM

## 2016-01-22 DIAGNOSIS — I481 Persistent atrial fibrillation: Secondary | ICD-10-CM | POA: Diagnosis not present

## 2016-01-22 DIAGNOSIS — I4819 Other persistent atrial fibrillation: Secondary | ICD-10-CM

## 2016-01-22 LAB — POCT INR: INR: 2.7

## 2016-01-29 ENCOUNTER — Telehealth: Payer: Self-pay | Admitting: *Deleted

## 2016-01-29 NOTE — Telephone Encounter (Signed)
Called and spoke with pt regarding change in dental surgery.  Instructed pt to take her last dose of Coumadin on 02/07/16, instead of 02/06/16.  Also, told pt to restart Coumadin the day of her procedure, on 02/12/16, and to take 1.5 tablets on 02/12/16 and 02/13/16 (unless the dentist tells her otherwise).  Then told pt to restart her normal dose of Coumadin on 02/14/16.  Spoke with Jinny Blossom, who wanted pt to take 1.5 tablets for only 2 days instead of 3 days.  Pt verbalized understanding and was told to call the office again if she had any more questions.

## 2016-01-29 NOTE — Telephone Encounter (Signed)
Pt's dental surgery has been changed to July 26--pls call pt and let her know what to do for her coumadin

## 2016-02-07 ENCOUNTER — Encounter: Payer: Self-pay | Admitting: Family Medicine

## 2016-02-07 ENCOUNTER — Ambulatory Visit (INDEPENDENT_AMBULATORY_CARE_PROVIDER_SITE_OTHER): Payer: Medicare Other | Admitting: Family Medicine

## 2016-02-07 VITALS — BP 110/78 | HR 84 | Temp 97.6°F | Wt 122.5 lb

## 2016-02-07 DIAGNOSIS — I481 Persistent atrial fibrillation: Secondary | ICD-10-CM | POA: Diagnosis not present

## 2016-02-07 DIAGNOSIS — IMO0001 Reserved for inherently not codable concepts without codable children: Secondary | ICD-10-CM

## 2016-02-07 DIAGNOSIS — I509 Heart failure, unspecified: Secondary | ICD-10-CM

## 2016-02-07 DIAGNOSIS — M4850XG Collapsed vertebra, not elsewhere classified, site unspecified, subsequent encounter for fracture with delayed healing: Secondary | ICD-10-CM | POA: Diagnosis not present

## 2016-02-07 DIAGNOSIS — I4819 Other persistent atrial fibrillation: Secondary | ICD-10-CM

## 2016-02-07 DIAGNOSIS — M810 Age-related osteoporosis without current pathological fracture: Secondary | ICD-10-CM

## 2016-02-07 DIAGNOSIS — I50810 Right heart failure, unspecified: Secondary | ICD-10-CM

## 2016-02-07 DIAGNOSIS — D509 Iron deficiency anemia, unspecified: Secondary | ICD-10-CM

## 2016-02-07 MED ORDER — TRAMADOL HCL 50 MG PO TABS: 50.0000 mg | ORAL_TABLET | Freq: Three times a day (TID) | ORAL | Status: DC | PRN

## 2016-02-07 NOTE — Assessment & Plan Note (Signed)
Pt has started ferrous sulfate. Denies significant constipation trouble at this time. Not using narcotic much.

## 2016-02-07 NOTE — Progress Notes (Signed)
BP 110/78 mmHg  Pulse 84  Temp(Src) 97.6 F (36.4 C) (Oral)  Wt 122 lb 8 oz (55.566 kg)   CC: f/u visit  Subjective:    Patient ID: [name], female    DOB: 11/26/1925, 82 y.o.   MRN: [id number]  HPI: [name] is a 82 y.o. female presenting on 02/07/2016 for Follow-up   See prior note for details. Intermittent lower back pain due to acute and chronic compression fractures + spondylosis. We started calcitonin nasal spray and referred her to spine to discuss possible kyphoplasty. Saw Dr Christella Noa - rec against kyphoplasty as other prior compression fractures have improved on their own. Placed in lumbar brace. Using tramadol and hydrocodone rarely PRN pain - actually now only taking tylenol 1000mg  TID. Has neurosurgery follow up next week.   Noticing worsening joint pains in shoulders, knees, arms, and hips.   Dental surgery July 25th.   Taking nasal calcitonin.   Relevant past medical, surgical, family and social history reviewed and updated as indicated. Interim medical history since our last visit reviewed. Allergies and medications reviewed and updated. Current Outpatient Prescriptions on File Prior to Visit  Medication Sig  . acetaminophen (TYLENOL) 500 MG tablet Take 2 tablets (1,000 mg total) by mouth 3 (three) times daily.  . calcitonin, salmon, (MIACALCIN/FORTICAL) 200 UNIT/ACT nasal spray Place 1 spray into alternate nostrils daily.  . Calcium-Magnesium-Vitamin D [id number] MG-MG-UNIT TABS Take 1 tablet by mouth 2 (two) times daily.  . clonazePAM (KLONOPIN) 0.5 MG tablet Take 1 tablet (0.5 mg total) by mouth at bedtime as needed for anxiety.  . felodipine (PLENDIL) 2.5 MG 24 hr tablet Take 1 tablet (2.5 mg total) by mouth daily.  Marland Kitchen ibandronate (BONIVA) 3 MG/3ML SOLN injection Inject 3 mg into the vein every 3 (three) months.   . latanoprost (XALATAN) 0.005 % ophthalmic solution Place 1 drop into both eyes at bedtime.   Marland Kitchen levothyroxine (SYNTHROID, LEVOTHROID)  50 MCG tablet TAKE 1 TABLET DAILY  . Magnesium Oxide 500 MG (LAX) TABS Take 1 tablet by mouth 3 (three) times a week.  . Multiple Vitamin (MULTIVITAMIN) capsule Take 1 capsule by mouth every morning.   . Multiple Vitamins-Minerals (ICAPS AREDS FORMULA PO) Take 1 tablet by mouth daily.   . Omega-3 Fatty Acids (FISH OIL CONCENTRATE) 1000 MG CAPS Take 2 capsules by mouth 2 (two) times daily.    . polyethylene glycol (MIRALAX / GLYCOLAX) packet Take 17 g by mouth daily.  . potassium chloride SA (K-DUR,KLOR-CON) 20 MEQ tablet Take 1 tablet (20 mEq total) by mouth daily as needed.  . sertraline (ZOLOFT) 50 MG tablet Take 1 tablet (50 mg total) by mouth daily.  . vitamin B-12 (CYANOCOBALAMIN) 500 MCG tablet Take 500 mcg by mouth daily.  Marland Kitchen warfarin (COUMADIN) 2.5 MG tablet Take 2.5 mg by mouth daily.   . [DISCONTINUED] Calcium Carbonate-Vitamin D (CALCIUM PLUS VITAMIN D PO) Take 2 tablets by mouth daily.     No current facility-administered medications on file prior to visit.    Review of Systems Per HPI unless specifically indicated in ROS section     Objective:    BP 110/78 mmHg  Pulse 84  Temp(Src) 97.6 F (36.4 C) (Oral)  Wt 122 lb 8 oz (55.566 kg)  Wt Readings from Last 3 Encounters:  02/07/16 122 lb 8 oz (55.566 kg)  01/16/16 115 lb 1.9 oz (52.218 kg)  12/31/15 122 lb 4 oz (55.452 kg)    Physical Exam  Constitutional:  She appears well-developed and well-nourished. No distress.  HENT:  Mouth/Throat: Oropharynx is clear and moist. No oropharyngeal exudate.  Cardiovascular: Normal rate, regular rhythm, normal heart sounds and intact distal pulses.   No murmur heard. Pulmonary/Chest: Effort normal and breath sounds normal. No respiratory distress. She has no wheezes. She has no rales.  Musculoskeletal: She exhibits edema (2+).  scoliosis  Skin: Skin is warm and dry. No rash noted.  Nursing note and vitals reviewed.  Results for orders placed or performed in visit on 01/22/16    POCT INR  Result Value Ref Range   INR 2.7    Lab Results  Component Value Date   WBC 8.9 12/29/2015   HGB 10.4* 12/29/2015   HCT 31.8* 12/29/2015   MCV 94.9 12/29/2015   PLT 362 12/29/2015    Lab Results  Component Value Date   IRON 41* 01/16/2016   FERRITIN 102.5 01/16/2016    Lab Results  Component Value Date   CREATININE 0.66 01/16/2016     Lab Results  Component Value Date   TSH 2.94 08/01/2015       Assessment & Plan:   Problem List Items Addressed This Visit    Compression fracture of vertebrae (HCC)    Continue lumbar support brace. Predominantly using tylenol 1000mg  TID      Iron deficiency anemia    Pt has started ferrous sulfate. Denies significant constipation trouble at this time. Not using narcotic much.       Relevant Medications   ferrous sulfate 325 (65 FE) MG tablet   Osteoporosis    Bisphosphonate on hold. Upcoming dental surgery.  Continue calcitonin nasal spray.       Persistent atrial fibrillation (HCC) - Primary   Relevant Medications   furosemide (LASIX) 20 MG tablet   Right heart failure, NYHA class 3 (HCC)    Ongoing pedal edema. Lasix 20mg  daily not effective. Increase to 40mg  daily PRN. Discussed potassium 65mEq use with lasix.  No significant dyspnea or cough.       Relevant Medications   furosemide (LASIX) 20 MG tablet       Follow up plan: Return in about 4 weeks (around 03/06/2016), or as needed, for follow up visit.  Ria Bush, MD

## 2016-02-07 NOTE — Assessment & Plan Note (Signed)
Bisphosphonate on hold. Upcoming dental surgery.  Continue calcitonin nasal spray.

## 2016-02-07 NOTE — Assessment & Plan Note (Addendum)
Ongoing pedal edema. Lasix 20mg  daily not effective. Increase to 40mg  daily PRN. Discussed potassium 4mEq use with lasix.  No significant dyspnea or cough.

## 2016-02-07 NOTE — Patient Instructions (Addendum)
May increase lasix to 40mg  at a time once daily as needed for leg swelling. Take potassium tablet if lasix used.  Continue tylenol, we expect to see continued slow improvements daily.  Continue calcitonin nasal spray. Return in 1 month for follow up visit or as needed

## 2016-02-07 NOTE — Assessment & Plan Note (Signed)
Continue lumbar support brace. Predominantly using tylenol 1000mg  TID

## 2016-02-11 DIAGNOSIS — S32030A Wedge compression fracture of third lumbar vertebra, initial encounter for closed fracture: Secondary | ICD-10-CM | POA: Diagnosis not present

## 2016-02-18 ENCOUNTER — Other Ambulatory Visit: Payer: Self-pay | Admitting: *Deleted

## 2016-02-18 MED ORDER — SERTRALINE HCL 50 MG PO TABS: 50.0000 mg | ORAL_TABLET | Freq: Every day | ORAL | 2 refills | Status: DC

## 2016-02-19 ENCOUNTER — Encounter (HOSPITAL_COMMUNITY): Payer: Self-pay | Admitting: Emergency Medicine

## 2016-02-19 ENCOUNTER — Emergency Department (HOSPITAL_COMMUNITY)
Admission: EM | Admit: 2016-02-19 | Discharge: 2016-02-19 | Disposition: A | Discharge status: Home / Self Care | Payer: Medicare Other | Attending: Emergency Medicine | Admitting: Emergency Medicine

## 2016-02-19 ENCOUNTER — Emergency Department (HOSPITAL_COMMUNITY): Payer: Medicare Other

## 2016-02-19 ENCOUNTER — Telehealth: Payer: Self-pay | Admitting: Family Medicine

## 2016-02-19 DIAGNOSIS — Z7901 Long term (current) use of anticoagulants: Secondary | ICD-10-CM | POA: Insufficient documentation

## 2016-02-19 DIAGNOSIS — Y929 Unspecified place or not applicable: Secondary | ICD-10-CM | POA: Diagnosis not present

## 2016-02-19 DIAGNOSIS — I129 Hypertensive chronic kidney disease with stage 1 through stage 4 chronic kidney disease, or unspecified chronic kidney disease: Secondary | ICD-10-CM | POA: Diagnosis not present

## 2016-02-19 DIAGNOSIS — E039 Hypothyroidism, unspecified: Secondary | ICD-10-CM | POA: Diagnosis not present

## 2016-02-19 DIAGNOSIS — N390 Urinary tract infection, site not specified: Secondary | ICD-10-CM

## 2016-02-19 DIAGNOSIS — M549 Dorsalgia, unspecified: Secondary | ICD-10-CM | POA: Diagnosis present

## 2016-02-19 DIAGNOSIS — Y999 Unspecified external cause status: Secondary | ICD-10-CM | POA: Insufficient documentation

## 2016-02-19 DIAGNOSIS — R0602 Shortness of breath: Secondary | ICD-10-CM | POA: Diagnosis not present

## 2016-02-19 DIAGNOSIS — S32030A Wedge compression fracture of third lumbar vertebra, initial encounter for closed fracture: Secondary | ICD-10-CM | POA: Diagnosis not present

## 2016-02-19 DIAGNOSIS — X58XXXA Exposure to other specified factors, initial encounter: Secondary | ICD-10-CM | POA: Insufficient documentation

## 2016-02-19 DIAGNOSIS — Z79899 Other long term (current) drug therapy: Secondary | ICD-10-CM | POA: Insufficient documentation

## 2016-02-19 DIAGNOSIS — E119 Type 2 diabetes mellitus without complications: Secondary | ICD-10-CM | POA: Diagnosis not present

## 2016-02-19 DIAGNOSIS — N182 Chronic kidney disease, stage 2 (mild): Secondary | ICD-10-CM | POA: Diagnosis not present

## 2016-02-19 DIAGNOSIS — Z87891 Personal history of nicotine dependence: Secondary | ICD-10-CM | POA: Insufficient documentation

## 2016-02-19 DIAGNOSIS — J449 Chronic obstructive pulmonary disease, unspecified: Secondary | ICD-10-CM | POA: Diagnosis not present

## 2016-02-19 DIAGNOSIS — M4856XA Collapsed vertebra, not elsewhere classified, lumbar region, initial encounter for fracture: Secondary | ICD-10-CM

## 2016-02-19 DIAGNOSIS — M545 Low back pain: Secondary | ICD-10-CM | POA: Diagnosis not present

## 2016-02-19 DIAGNOSIS — Y939 Activity, unspecified: Secondary | ICD-10-CM | POA: Insufficient documentation

## 2016-02-19 DIAGNOSIS — S32039A Unspecified fracture of third lumbar vertebra, initial encounter for closed fracture: Secondary | ICD-10-CM | POA: Diagnosis not present

## 2016-02-19 LAB — URINALYSIS, ROUTINE W REFLEX MICROSCOPIC
Bilirubin Urine: NEGATIVE
Glucose, UA: NEGATIVE mg/dL
Hgb urine dipstick: NEGATIVE
Ketones, ur: NEGATIVE mg/dL
NITRITE: POSITIVE — AB
PROTEIN: NEGATIVE mg/dL
pH: 7 (ref 5.0–8.0)

## 2016-02-19 LAB — COMPREHENSIVE METABOLIC PANEL
ALK PHOS: 66 U/L (ref 38–126)
ALT: 15 U/L (ref 14–54)
ANION GAP: 8 (ref 5–15)
AST: 21 U/L (ref 15–41)
Albumin: 3.1 g/dL — ABNORMAL LOW (ref 3.5–5.0)
BUN: 8 mg/dL (ref 6–20)
CHLORIDE: 100 mmol/L — AB (ref 101–111)
CO2: 27 mmol/L (ref 22–32)
Calcium: 8.8 mg/dL — ABNORMAL LOW (ref 8.9–10.3)
Creatinine, Ser: 0.53 mg/dL (ref 0.44–1.00)
GFR calc non Af Amer: >60 mL/min (ref >60)
GLUCOSE: 126 mg/dL — AB (ref 65–99)
Potassium: 3.2 mmol/L — ABNORMAL LOW (ref 3.5–5.1)
SODIUM: 135 mmol/L (ref 135–145)
Total Bilirubin: 1 mg/dL (ref 0.3–1.2)
Total Protein: 6.8 g/dL (ref 6.5–8.1)

## 2016-02-19 LAB — CBC WITH DIFFERENTIAL/PLATELET
BASOS ABS: 0 10*3/uL (ref 0.0–0.1)
Basophils Relative: 0 %
EOS PCT: 0 %
Eosinophils Absolute: 0 10*3/uL (ref 0.0–0.7)
HCT: 35.3 % — ABNORMAL LOW (ref 36.0–46.0)
HEMOGLOBIN: 11.8 g/dL — AB (ref 12.0–15.0)
LYMPHS ABS: 1.3 10*3/uL (ref 0.7–4.0)
LYMPHS PCT: 13 %
MCH: 31.4 pg (ref 26.0–34.0)
MCHC: 33.4 g/dL (ref 30.0–36.0)
MCV: 93.9 fL (ref 78.0–100.0)
Monocytes Absolute: 0.9 10*3/uL (ref 0.1–1.0)
Monocytes Relative: 9 %
NEUTROS ABS: 7.9 10*3/uL — AB (ref 1.7–7.7)
NEUTROS PCT: 78 %
PLATELETS: 416 10*3/uL — AB (ref 150–400)
RBC: 3.76 MIL/uL — AB (ref 3.87–5.11)
RDW: 17.2 % — ABNORMAL HIGH (ref 11.5–15.5)
WBC: 10.2 10*3/uL (ref 4.0–10.5)

## 2016-02-19 LAB — TROPONIN I

## 2016-02-19 LAB — PROTIME-INR
INR: 2.03
Prothrombin Time: 23.3 seconds — ABNORMAL HIGH (ref 11.4–15.2)

## 2016-02-19 LAB — URINE MICROSCOPIC-ADD ON
RBC / HPF: NONE SEEN RBC/hpf (ref 0–5)
SQUAMOUS EPITHELIAL / LPF: NONE SEEN

## 2016-02-19 LAB — LIPASE, BLOOD: LIPASE: 16 U/L (ref 11–51)

## 2016-02-19 MED ORDER — CEPHALEXIN 500 MG PO CAPS
500.0000 mg | ORAL_CAPSULE | Freq: Once | ORAL | Status: AC
  Administered 2016-02-19: 500 mg via ORAL
  Filled 2016-02-19: qty 1

## 2016-02-19 MED ORDER — ONDANSETRON HCL 4 MG PO TABS
4.0000 mg | ORAL_TABLET | Freq: Once | ORAL | Status: AC
  Administered 2016-02-19: 4 mg via ORAL
  Filled 2016-02-19: qty 1

## 2016-02-19 MED ORDER — ACETAMINOPHEN 500 MG PO TABS
1000.0000 mg | ORAL_TABLET | Freq: Once | ORAL | Status: AC
  Administered 2016-02-19: 1000 mg via ORAL
  Filled 2016-02-19: qty 2

## 2016-02-19 MED ORDER — CEPHALEXIN 500 MG PO CAPS: 500.0000 mg | ORAL_CAPSULE | Freq: Four times a day (QID) | ORAL | 0 refills | Status: DC

## 2016-02-19 NOTE — Telephone Encounter (Signed)
Message left for patient to return my call.  

## 2016-02-19 NOTE — ED Triage Notes (Signed)
Pt reports was getting out of bed and reports put feet on the floor and reports back pain x5 weeks. Pt reports does not usually have to use any assistive devices but reports ever since this incident has had to use transfer chair. Pt reports pain was improving but reports last several days.   Pt reports urinary frequency this am and reports took AZO x2 prior to arrival.

## 2016-02-19 NOTE — ED Provider Notes (Signed)
Medical screening examination/treatment/procedure(s) were conducted as a shared visit with non-physician practitioner(s) and myself.  I personally evaluated the patient during the encounter. Briefly, the patient is a 82 year old female with a history of A. fib and recent lumbar spine compression fracture presents to the ED with back pain and abdominal pain/distension. No neurologic deficits concerning for cauda equina or cord compression. POCUS w/o evidence of urinary retention.  xray of the lumbar spine revealed stable fracture when compared to 7/25. Workup remarkable for urinary tract infection we'll treat her with Keflex.  Patient reported that she had not received rehabilitation from her compression fracture. Contacted care coordination will arrange home visit and possible in-home rehabilitation.    EKG Interpretation  Date/Time:  Wednesday February 19 2016 10:32:46 EDT Ventricular Rate:  93 PR Interval:    QRS Duration: 95 QT Interval:  368 QTC Calculation: 446 R Axis:   20 Text Interpretation:  Atrial fibrillation Low voltage, extremity and precordial leads R wave progression improved. Nonspecific T wave abnormality Confirmed by [facility] MD, PEDRO [phone]) on 02/19/2016 4:22:13 PM        Emergency Focused Ultrasound Exam Limited ultrasound of the Bladder.   Performed and interpreted by Dr. Leonette Monarch Indication: evaluation for urinary retention Transverse and Sagittal views of the bladder are obtained and calculations are performed to determine an estimated bladder volume for signs of post-renal obstruction.  Findings: Bladder is full with an estimated <60 cc. Interpretation: no evidence of outlet obstruction Images archived electronically.  CPT Codes: [phone] and [id number]     Fatima Blank, MD 02/19/16 2041

## 2016-02-19 NOTE — ED Notes (Signed)
Home health order put in. Advised pt and family that if they do not call her by Friday to give [place] a call back. Pt and family verbalized an undertanding. Nad.

## 2016-02-19 NOTE — ED Provider Notes (Addendum)
[facility] DEPT Provider Note   CSN: [id number] Arrival date & time: 02/19/16  [id number]  First Provider Contact:  First MD Initiated Contact with Patient 02/19/16 [id number]        History   Chief Complaint Chief Complaint  Patient presents with  . Back Pain    HPI [name] is a 82 y.o. female.  Patient is an 82 year old female who presents to the emergency department with a complaint of back pain and mid body pain.  The patient has a history of arthritis changes, chronic obstructive pulmonary disease, diabetes mellitus, hypertension, and multiple compression fractures. She is also had fracture of the pelvis.  The patient and family gives history that she was quite active up until March of this year when she began to have problems with compression fractures. The patient was previously bowling, but starting in March 2017 had more and more problems with bowling or any other activities. She currently uses a walker. She has been recently diagnosed with compression fractures in the lumbar spine area. She's been fitted with a back device to assist with her discomfort. She currently uses Tylenol for assistance with her discomfort. She states that at times she has to have something stronger like hydrocodone. She presents to the emergency department today because she is having increasing pain, particularly of her back on. She also complains of pain in her mid body anteriorly. The family reports that she had "dry heaves" on last evening. The patient feels that her stomach seems to build more bloated and enlarged than usual. There's been no high fever reported. There's been no emesis. No recent blood in the stool. She did report constipation due to being on narcotics. No recent blood in the urine. There's been no fall or trauma reported.      Past Medical History:  Diagnosis Date  . Abnormal drug screen 06/2015  . Anemia   . Anxiety   . Aortic sclerosis ([place])   . Arthritis   . Chronic  gastritis 10/2009   Duodenitis on EGD  . CKD (chronic kidney disease) stage 2, GFR 60-89 ml/min   . Collagen vascular disease ([place])   . COPD (chronic obstructive pulmonary disease) ([place])   . Diabetes type 2, controlled ([place])    Diet controlled  . Diverticulosis   . Dry ARMD    Retinal hemorrhage (09/2013) [place]  . Essential hypertension, benign   . GERD (gastroesophageal reflux disease)   . Glaucoma    Dr. Venetia Maxon  . Hiatal hernia 2011   Small  . History of pelvic fracture April 2013  . History of rheumatic fever   . History of skin cancer   . History of TIA (transient ischemic attack)   . Hypothyroidism   . Macular degeneration   . Mixed hyperlipidemia   . Osteoporosis 04/2013   Thoracic compression fracture, on bisphosphonate and cal/vit D  . Paroxysmal atrial fibrillation (H[facility])   . Pulmonary nodules    Stable bilateral on CT 01/2010, rec rpt yearly for 2 yrs, pt decided to stop f/u  . Seasonal allergies     Patient Active Problem List   Diagnosis Date Noted  . Iron deficiency anemia 01/16/2016  . Compression fracture of vertebrae ([place]) 12/23/2015  . RLQ abdominal pain 12/17/2015  . Right heart failure, NYHA class 3 ([place]) 11/22/2015  . Chronic nonspecific lung disease ([place]) 10/17/2015  . Macrocytosis 08/08/2015  . Soreness of tongue 08/08/2015  . Pain in lower back 06/23/2015  . Macular degeneration   .  Glaucoma   . Advanced care planning/counseling discussion 04/26/2014  . Encounter for therapeutic drug monitoring 08/14/2013  . Depression with anxiety 04/20/2013  . Unspecified family circumstance 04/20/2013  . Medicare annual wellness visit, subsequent 04/03/2012  . Hypothyroidism   . Diet-controlled diabetes mellitus ([place])   . Pulmonary nodules   . Anxiety attack   . GERD (gastroesophageal reflux disease)   . Osteoporosis   . TIA (transient ischemic attack) 05/18/2011  . Fatigue 10/13/2010  . Essential hypertension, benign 05/05/2010  . Constipation  03/14/2010  . MITRAL VALVE DISORDER 02/19/2010  . Hyperlipidemia 01/11/2009  . Persistent atrial fibrillation ([place]) 06/22/2008    Past Surgical History:  Procedure Laterality Date  . BREAST LUMPECTOMY  1983   LEFT, benign  . Carotid US  2011   mild plaque formation  . CATARACT EXTRACTION  2012   RIGHT  . CESAREAN SECTION     (and h/o 6 miscarriages)  . CHOLECYSTECTOMY N/A 02/27/2013   Procedure: LAPAROSCOPIC CHOLECYSTECTOMY;  Surgeon: Donato Heinz, MD;  Location: [facility];  Service: General;  Laterality: N/A;  . COLONOSCOPY  03/2010   int hemorrhoids, diverticulosis, no need to repeat (Dr. Sydell Axon)  . DEXA  05/2010   T score -4.5 spine, -2.4 femur; does not want to repeat  . DEXA  04/2013   T score 3.9 [facility] spine, 2.6 hip  . DILATION AND CURETTAGE OF UTERUS     x2  . ESOPHAGOGASTRODUODENOSCOPY  10/2009   small HH, multiple antric ulcerations s/p biopsy, mild chronic gastritis/duodenitis  . ESOPHAGOGASTRODUODENOSCOPY  05/26/2012   RMR: small HH, o/w normal.   . LAPAROSCOPIC CHOLECYSTECTOMY  02/2013   Dr. Geroge Baseman  . PATELLA FRACTURE SURGERY  05/2006   LEFT surgically repaired with pins and wire  . TONSILLECTOMY      OB History    No data available       Home Medications    Prior to Admission medications   Medication Sig Start Date End Date Taking? Authorizing Provider  acetaminophen (TYLENOL) 500 MG tablet Take 2 tablets (1,000 mg total) by mouth 3 (three) times daily. 12/23/15  Yes Ria Bush, MD  calcitonin, salmon, (MIACALCIN/FORTICAL) 200 UNIT/ACT nasal spray Place 1 spray into alternate nostrils daily. 12/23/15  Yes Ria Bush, MD  Calcium-Magnesium-Vitamin D [phone] MG-MG-UNIT TABS Take 1 tablet by mouth 2 (two) times daily.   Yes Historical Provider, MD  clonazePAM (KLONOPIN) 0.5 MG tablet Take 1 tablet (0.5 mg total) by mouth at bedtime as needed for anxiety. 12/17/15  Yes Ria Bush, MD  felodipine (PLENDIL) 2.5 MG 24 hr tablet Take 1 tablet (2.5 mg  total) by mouth daily. 12/12/15  Yes Ria Bush, MD  ferrous sulfate 325 (65 FE) MG tablet Take 325 mg by mouth daily with breakfast.   Yes Historical Provider, MD  furosemide (LASIX) 20 MG tablet Take 40 mg by mouth daily as needed for fluid.  02/07/16  Yes Ria Bush, MD  ibandronate (BONIVA) 3 MG/3ML SOLN injection Inject 3 mg into the vein every 3 (three) months.  04/05/12  Yes Ria Bush, MD  latanoprost (XALATAN) 0.005 % ophthalmic solution Place 1 drop into both eyes at bedtime.    Yes Historical Provider, MD  levothyroxine (SYNTHROID, LEVOTHROID) 50 MCG tablet TAKE 1 TABLET DAILY 06/24/15  Yes Ria Bush, MD  Magnesium Oxide 500 MG (LAX) TABS Take 1 tablet by mouth 3 (three) times a week.   Yes Historical Provider, MD  Multiple Vitamin (MULTIVITAMIN) capsule Take 1 capsule by  mouth every morning.    Yes Historical Provider, MD  Multiple Vitamins-Minerals (ICAPS AREDS FORMULA PO) Take 1 tablet by mouth daily.    Yes Historical Provider, MD  Omega-3 Fatty Acids (FISH OIL CONCENTRATE) 1000 MG CAPS Take 2 capsules by mouth 2 (two) times daily.     Yes Historical Provider, MD  polyethylene glycol (MIRALAX / GLYCOLAX) packet Take 17 g by mouth daily. 11/15/15  Yes Ria Bush, MD  potassium chloride SA (K-DUR,KLOR-CON) 20 MEQ tablet Take 1 tablet (20 mEq total) by mouth daily as needed. Patient taking differently: Take 20 mEq by mouth daily as needed (along with Lasix).  01/20/16  Yes Ria Bush, MD  sertraline (ZOLOFT) 50 MG tablet Take 1 tablet (50 mg total) by mouth daily. 02/18/16  Yes Ria Bush, MD  traMADol (ULTRAM) 50 MG tablet Take 1 tablet (50 mg total) by mouth every 8 (eight) hours as needed. Patient taking differently: Take 50 mg by mouth every 8 (eight) hours as needed for moderate pain.  02/07/16  Yes Ria Bush, MD  vitamin B-12 (CYANOCOBALAMIN) 500 MCG tablet Take 500 mcg by mouth daily.   Yes Historical Provider, MD  warfarin (COUMADIN) 2.5 MG  tablet Take 2.5-3.75 mg by mouth daily. Take 2.5 mg every day except 3.75 mg on Monday's.   Yes Historical Provider, MD  HYDROcodone-acetaminophen (NORCO/VICODIN) 5-325 MG tablet Take 0.5-1 tablets by mouth 3 (three) times daily as needed. 01/01/16   Historical Provider, MD    Family History Family History  Problem Relation Age of Onset  . Coronary artery disease Mother 79    MI deceased  . Colon cancer Father 90  . Arrhythmia Sister   . Stroke Paternal Grandmother   . Diabetes Neg Hx     Social History Social History  Substance Use Topics  . Smoking status: Former Smoker    Packs/day: 1.00    Years: 20.00    Types: Cigarettes    Quit date: 10/13/1998  . Smokeless tobacco: Never Used  . Alcohol use No     Allergies   Propoxyphene n-acetaminophen; Codeine; Meperidine hcl; Morphine; and Shellfish allergy   Review of Systems Review of Systems  Constitutional: Positive for appetite change. Negative for chills, fatigue and fever.  HENT: Negative for congestion and sore throat.   Eyes: Negative for visual disturbance.  Respiratory: Negative for cough, chest tightness and shortness of breath.   Cardiovascular: Negative for chest pain.  Gastrointestinal: Positive for abdominal pain and constipation. Negative for blood in stool, diarrhea, nausea and vomiting.  Endocrine: Negative for cold intolerance and heat intolerance.  Genitourinary: Negative for decreased urine volume, difficulty urinating and frequency.  Musculoskeletal: Positive for arthralgias, back pain and gait problem (requires walker since her recent compression fracture. no other acute changes.). Negative for neck pain and neck stiffness.  Skin: Negative for rash.  Neurological: Negative for dizziness, weakness, light-headedness and headaches.  All other systems reviewed and are negative.    Physical Exam Updated Vital Signs BP 133/83   Pulse 107   Temp 97.7 F (36.5 C) (Oral)   Resp 18   Ht 5\' 8"  (1.727 m)    Wt 54.4 kg   SpO2 (!) 89%   BMI 18.25 kg/m   Physical Exam  Constitutional: She is oriented to person, place, and time. She appears well-developed and well-nourished.  Non-toxic appearance.  HENT:  Head: Normocephalic.  Right Ear: Tympanic membrane and external ear normal.  Left Ear: Tympanic membrane and external ear normal.  Tongue is dry. Uvula midline. Airway patent.  Eyes: EOM and lids are normal. Pupils are equal, round, and reactive to light.  Neck: Normal range of motion. Neck supple. Carotid bruit is not present.  Cardiovascular: Normal heart sounds, intact distal pulses and normal pulses.  An irregularly irregular rhythm present. Tachycardia present.   Tachycardia of 102.  Pulmonary/Chest: Breath sounds normal. No respiratory distress.  Abdominal: Soft. Bowel sounds are normal. There is no tenderness. There is no guarding.  Bowel sounds are present and active. The patient has gaseous bowel sounds, but bowel sounds are present and active. There is no tenderness to palpation in any of the upper or lower quadrants. There is no CVA tenderness reported. No mass or pulsatile mass appreciated on.  Musculoskeletal: Normal range of motion.  There is pain to palpation of the lower thoracic and entire lumbar area. There is paraspinal area tenderness in the lumbar region. No deformity of the lower extremities.  Lymphadenopathy:       Head (right side): No submandibular adenopathy present.       Head (left side): No submandibular adenopathy present.    She has no cervical adenopathy.  Neurological: She is alert and oriented to person, place, and time. She has normal strength. No cranial nerve deficit or sensory deficit.  Skin: Skin is warm and dry.  Decrease skin turgor  Psychiatric: She has a normal mood and affect. Her speech is normal.  Nursing note and vitals reviewed.    ED Treatments / Results  Labs (all labs ordered are listed, but only abnormal results are displayed) Labs  Reviewed  URINALYSIS, ROUTINE W REFLEX MICROSCOPIC (NOT AT [facility]) - Abnormal; Notable for the following:       Result Value   Specific Gravity, Urine <1.005 (*)    Nitrite POSITIVE (*)    Leukocytes, UA TRACE (*)    All other components within normal limits  COMPREHENSIVE METABOLIC PANEL - Abnormal; Notable for the following:    Potassium 3.2 (*)    Chloride 100 (*)    Glucose, Bld 126 (*)    Calcium 8.8 (*)    Albumin 3.1 (*)    All other components within normal limits  CBC WITH DIFFERENTIAL/PLATELET - Abnormal; Notable for the following:    RBC 3.76 (*)    Hemoglobin 11.8 (*)    HCT 35.3 (*)    RDW 17.2 (*)    Platelets 416 (*)    Neutro Abs 7.9 (*)    All other components within normal limits  PROTIME-INR - Abnormal; Notable for the following:    Prothrombin Time 23.3 (*)    All other components within normal limits  URINE MICROSCOPIC-ADD ON - Abnormal; Notable for the following:    Bacteria, UA MANY (*)    All other components within normal limits  URINE CULTURE  LIPASE, BLOOD  TROPONIN I    EKG  EKG Interpretation  Date/Time:  Wednesday February 19 2016 10:32:46 EDT Ventricular Rate:  93 PR Interval:    QRS Duration: 95 QT Interval:  368 QTC Calculation: 446 R Axis:   20 Text Interpretation:  Atrial fibrillation Low voltage, extremity and precordial leads R wave progression improved. Nonspecific T wave abnormality Confirmed by [facility] MD, PEDRO [phone]) on 02/19/2016 4:22:13 PM       Radiology Dg Lumbar Spine Complete  Result Date: 02/19/2016 CLINICAL DATA:  Back pain for 5 weeks. History of compression fractures. EXAM: LUMBAR SPINE - COMPLETE 4+ VIEW COMPARISON:  Plain films  from [facility] 02/11/2015. Recent MR, 12/21/2015. FINDINGS: Severe compression deformity of L3. Mild to moderate superior endplate depression L4. Mild wedging T12. Inferior endplate fracture of L1. On the [facility] radiograph there is asymmetric occur or greater loss of vertebral body height at L3 on the  RIGHT than LEFT. All of these fractures, the except for L3, appear chronic. There is greater than 50% loss of vertebral body height anteriorly at L3. There has been significant loss of vertebral body height at L3 when compared with the recent MR. The height is approximately the same when compared with the recent plain films from 07/25. Advanced osteopenia.  Ileus pattern.  Cholecystectomy clips. IMPRESSION: Severe compression fracture of L3 has progressed rapidly from previous MR of 12/21/2015. Vertebral augmentation could be helpful in pain management. Please contact the Interventional Radiology if desired. Electronically Signed   By: Staci Righter M.D.   On: 02/19/2016 12:07    Procedures Procedures (including critical care time)  Emergency Focused Ultrasound Exam Limited ultrasound of the Bladder.   Performed and interpreted by Dr. Leonette Monarch Indication: evaluation for urinary retention Transverse and Sagittal views of the bladder are obtained and calculations are performed to determine an estimated bladder volume for signs of post-renal obstruction.  Findings: Bladder is full with an estimated <60 cc. Interpretation: no evidence of outlet obstruction Images archived electronically.   Medications Ordered in ED Medications  cephALEXin (KEFLEX) capsule 500 mg (500 mg Oral Given 02/19/16 1155)  ondansetron (ZOFRAN) tablet 4 mg (4 mg Oral Given 02/19/16 1155)  acetaminophen (TYLENOL) tablet 1,000 mg (1,000 mg Oral Given 02/19/16 1231)     Initial Impression / Assessment and Plan / ED Course  Pt seen with me by Dr Leonette Monarch.  Bedside ultrasound reveals increase gas in the colon. No bladder distention.  I have reviewed the triage vital signs and the nursing notes.  Pertinent labs & imaging results that were available during my care of the patient were reviewed by me and considered in my medical decision making (see chart for details).  Clinical Course    **I have reviewed nursing notes, vital  signs, and all appropriate lab and imaging results for this patient.*  Final Clinical Impressions(s) / ED Diagnoses  The troponin is within normal limit. Doubt cardiac source of pain. Lipase is normal. The competence of metabolic panel shows potassium to be slightly low at 3.2. The remainder of the study was nonacute. The complete blood count shows the white blood cell count to be normal at 10,200. The hemoglobin and hematocrit are nonacute. Urinalysis is consistent with a urinary tract infection with positive nitrates and trace leukocyte esterase.  The patient is treated with Keflex, prescription for Keflex was given, and a culture of the urine is been sent to the lab. Home health physical therapy consult has been requested. They will come out to the patient's home for assessment. I've advised the patient to continue to use her back device. She will continue her Tylenol for pain, and use hydrocodone for more severe pain. Questions from the family as well as the patient were answered. The patient is in agreement with this discharge plan.    Final diagnoses:  None    New Prescriptions New Prescriptions   No medications on file     Lily Kocher, PA-C 02/19/16 [place], MD 02/19/16 2039    Fatima Blank, MD 02/19/16 2042

## 2016-02-19 NOTE — Discharge Instructions (Signed)
Your complete blood count is negative for advanced infection, or other emergent problems. Your PT/INR is 2.03, which is in good range. Your cardiac enzyme is negative for acute cardiac problem. Your x-ray shows the previous compression fractures present, in particular the L3 compression fracture seems to be worst at this time. No new compression fractures noted. Your urine tests suggest a urinary tract infection. He will be treated with Keflex 4 times daily with food. Please increase your fluids, as your clinical exam suggests some mild dehydration. Home health physical therapy has been suggested and ordered for evaluation. Please continue your current medications, and your back device. Please use the stool softener of your choice to help alleviate the extra gas and bloating in the abdomen area. Return to the emergency department if any changes, problems, or concerns before you see your primary physician.

## 2016-02-19 NOTE — ED Notes (Signed)
PT order sent

## 2016-02-19 NOTE — Telephone Encounter (Signed)
plz call for update. Would offer 8:30am appointment tomorrow if pt desires.

## 2016-02-19 NOTE — Telephone Encounter (Signed)
[place] Patient Name: [name] DOB: 07-Feb-1926 Initial Comment Caller states she broke her back a few weeks ago. She's having back pain. Weakness, can't move her leg up, or arm up very far. Nurse Assessment Nurse: Dimas Chyle, RN, Dellis Filbert Date/Time [name] Time): 02/19/2016 8:09:17 AM Confirm and document reason for call. If symptomatic, describe symptoms. You must click the next button to save text entered. ---Caller states she broke her back a few weeks ago. She's having back pain. Weakness, can't move her leg up, or arm up very far. Has the patient traveled out of the country within the last 30 days? ---No Does the patient have any new or worsening symptoms? ---Yes Will a triage be completed? ---Yes Related visit to physician within the last 2 weeks? ---Yes Does the PT have any chronic conditions? (i.e. diabetes, asthma, etc.) ---Yes List chronic conditions. ---HTN, hypothyroid Is this a behavioral health or substance abuse call? ---No Guidelines Guideline Title Affirmed Question Affirmed Notes Back Pain [1] SEVERE back pain (e.g., excruciating, unable to do any normal activities) AND [2] not improved 2 hours after pain medicine Final Disposition User See Physician within 4 Hours (or PCP triage) Dimas Chyle, RN, Dellis Filbert Comments No appointments available at [facility] location. Caller decided to go to [facility]. Referrals GO TO FACILITY UNDECIDED Disagree/Comply: Comply

## 2016-02-20 NOTE — Telephone Encounter (Signed)
Pt left v/m thanking Dr Darnell Level for being willing to add pt on to Dr [name] schedule but pt had already gone to ED.

## 2016-02-21 LAB — URINE CULTURE

## 2016-02-22 ENCOUNTER — Encounter (HOSPITAL_COMMUNITY): Payer: Self-pay

## 2016-02-22 ENCOUNTER — Emergency Department (HOSPITAL_COMMUNITY): Payer: Medicare Other

## 2016-02-22 ENCOUNTER — Emergency Department (HOSPITAL_COMMUNITY)
Admission: EM | Admit: 2016-02-22 | Discharge: 2016-02-22 | Disposition: A | Discharge status: Home / Self Care | Payer: Medicare Other | Attending: Emergency Medicine | Admitting: Emergency Medicine

## 2016-02-22 DIAGNOSIS — J449 Chronic obstructive pulmonary disease, unspecified: Secondary | ICD-10-CM | POA: Diagnosis not present

## 2016-02-22 DIAGNOSIS — E039 Hypothyroidism, unspecified: Secondary | ICD-10-CM | POA: Diagnosis not present

## 2016-02-22 DIAGNOSIS — N182 Chronic kidney disease, stage 2 (mild): Secondary | ICD-10-CM | POA: Insufficient documentation

## 2016-02-22 DIAGNOSIS — I129 Hypertensive chronic kidney disease with stage 1 through stage 4 chronic kidney disease, or unspecified chronic kidney disease: Secondary | ICD-10-CM | POA: Diagnosis not present

## 2016-02-22 DIAGNOSIS — Z87891 Personal history of nicotine dependence: Secondary | ICD-10-CM | POA: Diagnosis not present

## 2016-02-22 DIAGNOSIS — R1084 Generalized abdominal pain: Secondary | ICD-10-CM | POA: Insufficient documentation

## 2016-02-22 DIAGNOSIS — K5900 Constipation, unspecified: Secondary | ICD-10-CM

## 2016-02-22 DIAGNOSIS — E1122 Type 2 diabetes mellitus with diabetic chronic kidney disease: Secondary | ICD-10-CM | POA: Insufficient documentation

## 2016-02-22 DIAGNOSIS — Z79899 Other long term (current) drug therapy: Secondary | ICD-10-CM | POA: Diagnosis not present

## 2016-02-22 NOTE — Discharge Instructions (Signed)
Go to the drugstore and get some magnesium citrate. Drink one half of the bottle.  If you do not have a bowel movement in 4 hours then drink the other half of the bottle. Follow-up with her doctor if any problems

## 2016-02-22 NOTE — ED Provider Notes (Signed)
[facility] DEPT Provider Note   CSN: [id number] Arrival date & time: 02/22/16  [id number]  First Provider Contact:   First MD Initiated Contact with Patient 02/22/16 1013   By signing my name below, I, Dyke Brackett, attest that this documentation has been prepared under the direction and in the presence of Milton Ferguson, MD . Electronically Signed: Dyke Brackett, Scribe. 02/22/2016. 10:25 AM   History   Chief Complaint Chief Complaint  Patient presents with  . Constipation    HPI [name] is a 82 y.o. female with PMHx of chronic gastritis, diverticulosis, and constipation who presents to the Emergency Department complaining of constipation which began six days ago. She states her last BM was at. that time. Pt has taken Miralax and Ducalax with no relief .Pt was seen in the ED two days ago for a compression fracture in her lower back. She denies any discomfort. Pt has no other complaints at this time.   The patient complains of constipation   The history is provided by the patient. No language interpreter was used.  Abdominal Pain   This is a new problem. The current episode started more than 2 days ago. The problem occurs constantly. The problem has not changed since onset.The pain is associated with an unknown factor. The pain is located in the generalized abdominal region. The pain is at a severity of 3/10. The pain is mild. Pertinent negatives include anorexia, diarrhea, frequency, hematuria and headaches. Nothing aggravates the symptoms.    Past Medical History:  Diagnosis Date  . Abnormal drug screen 06/2015  . Anemia   . Anxiety   . Aortic sclerosis ([place])   . Arthritis   . Chronic gastritis 10/2009   Duodenitis on EGD  . CKD (chronic kidney disease) stage 2, GFR 60-89 ml/min   . Collagen vascular disease ([place])   . COPD (chronic obstructive pulmonary disease) ([place])   . Diabetes type 2, controlled ([place])    Diet controlled  . Diverticulosis   . Dry ARMD    Retinal  hemorrhage (09/2013) [place]  . Essential hypertension, benign   . GERD (gastroesophageal reflux disease)   . Glaucoma    Dr. Venetia Maxon  . Hiatal hernia 2011   Small  . History of pelvic fracture April 2013  . History of rheumatic fever   . History of skin cancer   . History of TIA (transient ischemic attack)   . Hypothyroidism   . Macular degeneration   . Mixed hyperlipidemia   . Osteoporosis 04/2013   Thoracic compression fracture, on bisphosphonate and cal/vit D  . Paroxysmal atrial fibrillation (H[facility])   . Pulmonary nodules    Stable bilateral on CT 01/2010, rec rpt yearly for 2 yrs, pt decided to stop f/u  . Seasonal allergies     Patient Active Problem List   Diagnosis Date Noted  . Iron deficiency anemia 01/16/2016  . Compression fracture of vertebrae ([place]) 12/23/2015  . RLQ abdominal pain 12/17/2015  . Right heart failure, NYHA class 3 ([place]) 11/22/2015  . Chronic nonspecific lung disease ([place]) 10/17/2015  . Macrocytosis 08/08/2015  . Soreness of tongue 08/08/2015  . Pain in lower back 06/23/2015  . Macular degeneration   . Glaucoma   . Advanced care planning/counseling discussion 04/26/2014  . Encounter for therapeutic drug monitoring 08/14/2013  . Depression with anxiety 04/20/2013  . Unspecified family circumstance 04/20/2013  . Medicare annual wellness visit, subsequent 04/03/2012  . Hypothyroidism   . Diet-controlled diabetes mellitus ([place])   .  Pulmonary nodules   . Anxiety attack   . GERD (gastroesophageal reflux disease)   . Osteoporosis   . TIA (transient ischemic attack) 05/18/2011  . Fatigue 10/13/2010  . Essential hypertension, benign 05/05/2010  . Constipation 03/14/2010  . MITRAL VALVE DISORDER 02/19/2010  . Hyperlipidemia 01/11/2009  . Persistent atrial fibrillation ([place]) 06/22/2008    Past Surgical History:  Procedure Laterality Date  . BREAST LUMPECTOMY  1983   LEFT, benign  . Carotid US  2011   mild plaque formation  . CATARACT EXTRACTION   2012   RIGHT  . CESAREAN SECTION     (and h/o 6 miscarriages)  . CHOLECYSTECTOMY N/A 02/27/2013   Procedure: LAPAROSCOPIC CHOLECYSTECTOMY;  Surgeon: Donato Heinz, MD;  Location: [facility];  Service: General;  Laterality: N/A;  . COLONOSCOPY  03/2010   int hemorrhoids, diverticulosis, no need to repeat (Dr. Sydell Axon)  . DEXA  05/2010   T score -4.5 spine, -2.4 femur; does not want to repeat  . DEXA  04/2013   T score 3.9 [facility] spine, 2.6 hip  . DILATION AND CURETTAGE OF UTERUS     x2  . ESOPHAGOGASTRODUODENOSCOPY  10/2009   small HH, multiple antric ulcerations s/p biopsy, mild chronic gastritis/duodenitis  . ESOPHAGOGASTRODUODENOSCOPY  05/26/2012   RMR: small HH, o/w normal.   . LAPAROSCOPIC CHOLECYSTECTOMY  02/2013   Dr. Geroge Baseman  . PATELLA FRACTURE SURGERY  05/2006   LEFT surgically repaired with pins and wire  . TONSILLECTOMY      OB History    No data available       Home Medications    Prior to Admission medications   Medication Sig Start Date End Date Taking? Authorizing Provider  acetaminophen (TYLENOL) 500 MG tablet Take 2 tablets (1,000 mg total) by mouth 3 (three) times daily. 12/23/15   Ria Bush, MD  calcitonin, salmon, (MIACALCIN/FORTICAL) 200 UNIT/ACT nasal spray Place 1 spray into alternate nostrils daily. 12/23/15   Ria Bush, MD  Calcium-Magnesium-Vitamin D [phone] MG-MG-UNIT TABS Take 1 tablet by mouth 2 (two) times daily.    Historical Provider, MD  cephALEXin (KEFLEX) 500 MG capsule Take 1 capsule (500 mg total) by mouth 4 (four) times daily. 02/19/16   Lily Kocher, PA-C  clonazePAM (KLONOPIN) 0.5 MG tablet Take 1 tablet (0.5 mg total) by mouth at bedtime as needed for anxiety. 12/17/15   Ria Bush, MD  felodipine (PLENDIL) 2.5 MG 24 hr tablet Take 1 tablet (2.5 mg total) by mouth daily. 12/12/15   Ria Bush, MD  ferrous sulfate 325 (65 FE) MG tablet Take 325 mg by mouth daily with breakfast.    Historical Provider, MD  furosemide (LASIX)  20 MG tablet Take 40 mg by mouth daily as needed for fluid.  02/07/16   Ria Bush, MD  HYDROcodone-acetaminophen (NORCO/VICODIN) 5-325 MG tablet Take 0.5-1 tablets by mouth 3 (three) times daily as needed. 01/01/16   Historical Provider, MD  ibandronate (BONIVA) 3 MG/3ML SOLN injection Inject 3 mg into the vein every 3 (three) months.  04/05/12   Ria Bush, MD  latanoprost (XALATAN) 0.005 % ophthalmic solution Place 1 drop into both eyes at bedtime.     Historical Provider, MD  levothyroxine (SYNTHROID, LEVOTHROID) 50 MCG tablet TAKE 1 TABLET DAILY 06/24/15   Ria Bush, MD  Magnesium Oxide 500 MG (LAX) TABS Take 1 tablet by mouth 3 (three) times a week.    Historical Provider, MD  Multiple Vitamin (MULTIVITAMIN) capsule Take 1 capsule by mouth every morning.  Historical Provider, MD  Multiple Vitamins-Minerals (ICAPS AREDS FORMULA PO) Take 1 tablet by mouth daily.     Historical Provider, MD  Omega-3 Fatty Acids (FISH OIL CONCENTRATE) 1000 MG CAPS Take 2 capsules by mouth 2 (two) times daily.      Historical Provider, MD  polyethylene glycol (MIRALAX / GLYCOLAX) packet Take 17 g by mouth daily. 11/15/15   Ria Bush, MD  potassium chloride SA (K-DUR,KLOR-CON) 20 MEQ tablet Take 1 tablet (20 mEq total) by mouth daily as needed. Patient taking differently: Take 20 mEq by mouth daily as needed (along with Lasix).  01/20/16   Ria Bush, MD  sertraline (ZOLOFT) 50 MG tablet Take 1 tablet (50 mg total) by mouth daily. 02/18/16   Ria Bush, MD  traMADol (ULTRAM) 50 MG tablet Take 1 tablet (50 mg total) by mouth every 8 (eight) hours as needed. Patient taking differently: Take 50 mg by mouth every 8 (eight) hours as needed for moderate pain.  02/07/16   Ria Bush, MD  vitamin B-12 (CYANOCOBALAMIN) 500 MCG tablet Take 500 mcg by mouth daily.    Historical Provider, MD  warfarin (COUMADIN) 2.5 MG tablet Take 2.5-3.75 mg by mouth daily. Take 2.5 mg every day except 3.75  mg on Monday's.    Historical Provider, MD    Family History Family History  Problem Relation Age of Onset  . Coronary artery disease Mother 41    MI deceased  . Colon cancer Father 76  . Arrhythmia Sister   . Stroke Paternal Grandmother   . Diabetes Neg Hx     Social History Social History  Substance Use Topics  . Smoking status: Former Smoker    Packs/day: 1.00    Years: 20.00    Types: Cigarettes    Quit date: 10/13/1998  . Smokeless tobacco: Never Used  . Alcohol use No     Allergies   Propoxyphene n-acetaminophen; Codeine; Meperidine hcl; Morphine; and Shellfish allergy   Review of Systems Review of Systems  Constitutional: Negative for appetite change and fatigue.  HENT: Negative for congestion, ear discharge and sinus pressure.   Eyes: Negative for discharge.  Respiratory: Negative for cough.   Cardiovascular: Negative for chest pain.  Gastrointestinal: Negative for abdominal pain, anorexia and diarrhea.  Genitourinary: Negative for frequency and hematuria.  Musculoskeletal: Negative for back pain.  Skin: Negative for rash.  Neurological: Negative for seizures and headaches.  Psychiatric/Behavioral: Negative for hallucinations.     Physical Exam Updated Vital Signs BP 132/87 (BP Location: Left Arm)   Pulse 93   Temp 98.1 F (36.7 C) (Oral)   Resp 18   Ht 5\' 4"  (1.626 m)   Wt 118 lb (53.5 kg)   SpO2 96%   BMI 20.25 kg/m   Physical Exam  Constitutional: She is oriented to person, place, and time. She appears well-developed.  HENT:  Head: Normocephalic.  Eyes: Conjunctivae and EOM are normal. No scleral icterus.  Neck: Neck supple. No thyromegaly present.  Cardiovascular: Normal rate and regular rhythm.  Exam reveals no gallop and no friction rub.   No murmur heard. Pulmonary/Chest: No stridor. She has no wheezes. She has no rales. She exhibits no tenderness.  Abdominal: She exhibits distension. There is no tenderness. There is no rebound.    Mildly distended abdomen  Musculoskeletal: Normal range of motion. She exhibits no edema.  Lymphadenopathy:    She has no cervical adenopathy.  Neurological: She is oriented to person, place, and time. She exhibits normal muscle  tone. Coordination normal.  Skin: No rash noted. No erythema.  Psychiatric: She has a normal mood and affect. Her behavior is normal.     ED Treatments / Results  DIAGNOSTIC STUDIES:  Oxygen Saturation is 96% on RA, adequate by my interpretation.    COORDINATION OF CARE:  10:20 AM Will order DG abdomen. Discussed treatment plan with pt at bedside and pt agreed to plan.  Labs (all labs ordered are listed, but only abnormal results are displayed) Labs Reviewed - No data to display  EKG  EKG Interpretation None       Radiology No results found.  Procedures Procedures (including critical care time)  Medications Ordered in ED Medications - No data to display   Initial Impression / Assessment and Plan / ED Course  I have reviewed the triage vital signs and the nursing notes.  Pertinent labs & imaging results that were available during my care of the patient were reviewed by me and considered in my medical decision making (see chart for details).  Clinical Course    Patient with mild constipation she is instructed to use magnesium citrate follow-up her PCP  Final Clinical Impressions(s) / ED Diagnoses   Final diagnoses:  None  The chart was scribed for me under my direct supervision.  I personally performed the history, physical, and medical decision making and all procedures in the evaluation of this patient..   New Prescriptions New Prescriptions   No medications on file     Milton Ferguson, MD 02/22/16 1500

## 2016-02-22 NOTE — ED Triage Notes (Signed)
Pt reports has compression fracture in lower back and reports abd pain and no bm in 6 days.

## 2016-02-24 ENCOUNTER — Encounter: Payer: Self-pay | Admitting: Family Medicine

## 2016-02-24 ENCOUNTER — Ambulatory Visit (INDEPENDENT_AMBULATORY_CARE_PROVIDER_SITE_OTHER): Payer: Medicare Other | Admitting: Family Medicine

## 2016-02-24 VITALS — BP 128/86 | HR 68 | Temp 97.8°F | Wt 117.2 lb

## 2016-02-24 DIAGNOSIS — M4850XG Collapsed vertebra, not elsewhere classified, site unspecified, subsequent encounter for fracture with delayed healing: Secondary | ICD-10-CM

## 2016-02-24 DIAGNOSIS — K5900 Constipation, unspecified: Secondary | ICD-10-CM

## 2016-02-24 DIAGNOSIS — IMO0001 Reserved for inherently not codable concepts without codable children: Secondary | ICD-10-CM

## 2016-02-24 MED ORDER — SENNA-DOCUSATE SODIUM 8.6-50 MG PO TABS: 1.0000 | ORAL_TABLET | Freq: Every day | ORAL | 3 refills | Status: DC

## 2016-02-24 MED ORDER — TRAMADOL HCL 50 MG PO TABS: 50.0000 mg | ORAL_TABLET | Freq: Three times a day (TID) | ORAL | 1 refills | Status: DC | PRN

## 2016-02-24 MED ORDER — CALCITONIN (SALMON) 200 UNIT/ACT NA SOLN: 1.0000 | Freq: Every day | NASAL | 12 refills | Status: DC

## 2016-02-24 NOTE — Progress Notes (Signed)
BP 128/86   Pulse 68   Temp 97.8 F (36.6 C) (Oral)   Wt 117 lb 4 oz (53.2 kg)   BMI 20.13 kg/m    CC: f/u ER visit Subjective:    Patient ID: [name], female    DOB: Aug 13, 1925, 82 y.o.   MRN: [id number]  HPI: [name] is a 82 y.o. female presenting on 02/24/2016 for Follow-up (back pain)   [name] neighbor's daughter drove her here today. Presents with husband today.   See prior note for details. Intermittent severe lower back pain due to acute and chronic compression fractures + spondylosis. We started calcitonin nasal spray and referred her to spine to discuss possible kyphoplasty - rec against kyphoplasty as other prior compression fractures improved on their own. Placed in lumbar brace. Using tramadol and hydrocodone rarely PRN pain - predominantly taking tylenol 1000mg  TID.   ER notes reviewed. Seen twice - first 02/19/2016 for acute worsening of chronic back pain. Found to have possible UTI - treated with 7d keflex course. UCx showed MBM none predominant. Xray also showed rapid progression of severe L3 compression fracture, suggesting re-consult for vertebral augmentation therapy. Discharged with HHPT. Latest seen at ER 02/22/2016 with constipation - rec take magnesium citrate and f/u today. Acute abd series without acute finding.   Relevant past medical, surgical, family and social history reviewed and updated as indicated. Interim medical history since our last visit reviewed. Allergies and medications reviewed and updated. Current Outpatient Prescriptions on File Prior to Visit  Medication Sig  . acetaminophen (TYLENOL) 500 MG tablet Take 2 tablets (1,000 mg total) by mouth 3 (three) times daily.  . Calcium-Magnesium-Vitamin D [id number] MG-MG-UNIT TABS Take 1 tablet by mouth 2 (two) times daily.  . cephALEXin (KEFLEX) 500 MG capsule Take 1 capsule (500 mg total) by mouth 4 (four) times daily.  . clonazePAM (KLONOPIN) 0.5 MG tablet Take 1 tablet (0.5 mg total)  by mouth at bedtime as needed for anxiety.  . felodipine (PLENDIL) 2.5 MG 24 hr tablet Take 1 tablet (2.5 mg total) by mouth daily.  . ferrous sulfate 325 (65 FE) MG tablet Take 325 mg by mouth daily with breakfast.  . furosemide (LASIX) 20 MG tablet Take 40 mg by mouth daily as needed for fluid.   Marland Kitchen HYDROcodone-acetaminophen (NORCO/VICODIN) 5-325 MG tablet Take 0.5-1 tablets by mouth 3 (three) times daily as needed.  . ibandronate (BONIVA) 3 MG/3ML SOLN injection Inject 3 mg into the vein every 3 (three) months.   . latanoprost (XALATAN) 0.005 % ophthalmic solution Place 1 drop into both eyes at bedtime.   Marland Kitchen levothyroxine (SYNTHROID, LEVOTHROID) 50 MCG tablet TAKE 1 TABLET DAILY  . Multiple Vitamin (MULTIVITAMIN) capsule Take 1 capsule by mouth every morning.   . Multiple Vitamins-Minerals (ICAPS AREDS FORMULA PO) Take 1 tablet by mouth daily.   . Omega-3 Fatty Acids (FISH OIL CONCENTRATE) 1000 MG CAPS Take 2 capsules by mouth 2 (two) times daily.    . polyethylene glycol (MIRALAX / GLYCOLAX) packet Take 17 g by mouth daily.  . potassium chloride SA (K-DUR,KLOR-CON) 20 MEQ tablet Take 1 tablet (20 mEq total) by mouth daily as needed. (Patient taking differently: Take 20 mEq by mouth daily as needed (along with Lasix). )  . sertraline (ZOLOFT) 50 MG tablet Take 1 tablet (50 mg total) by mouth daily.  . vitamin B-12 (CYANOCOBALAMIN) 500 MCG tablet Take 500 mcg by mouth daily.  Marland Kitchen warfarin (COUMADIN) 2.5 MG tablet Take 2.5-3.75 mg  by mouth daily. Take 2.5 mg every day except 3.75 mg on Monday's.  . [DISCONTINUED] Calcium Carbonate-Vitamin D (CALCIUM PLUS VITAMIN D PO) Take 2 tablets by mouth daily.     No current facility-administered medications on file prior to visit.     Review of Systems Per HPI unless specifically indicated in ROS section     Objective:    BP 128/86   Pulse 68   Temp 97.8 F (36.6 C) (Oral)   Wt 117 lb 4 oz (53.2 kg)   BMI 20.13 kg/m   Wt Readings from Last 3  Encounters:  02/24/16 117 lb 4 oz (53.2 kg)  02/22/16 118 lb (53.5 kg)  02/19/16 120 lb (54.4 kg)    Physical Exam  Constitutional: She appears well-developed and well-nourished.  Stiff movements Walks with walker Loose lumbar support brace in place  Nursing note and vitals reviewed.     Assessment & Plan:   Problem List Items Addressed This Visit    Compression fracture of vertebrae ([place]) - Primary    Worsening back pain with progression of L3 compression fracture by latest lumbar xray 02/19/2016. Will refer back to neurosurgery for re eval of therapeutic vertebral procedure. Pt agrees.      Relevant Orders   Ambulatory referral to Neurosurgery   Constipation    Reviewed bowel regimen.  rec continue miralax, start senna/docusate daily when on tramadol or hydrocodone.        Other Visit Diagnoses   None.      Follow up plan: Return if symptoms worsen or fail to improve.  Ria Bush, MD

## 2016-02-24 NOTE — Progress Notes (Signed)
Pre visit review using our clinic review tool, if applicable. No additional management support is needed unless otherwise documented below in the visit note. 

## 2016-02-24 NOTE — Assessment & Plan Note (Signed)
Reviewed bowel regimen.  rec continue miralax, start senna/docusate daily when on tramadol or hydrocodone.

## 2016-02-24 NOTE — Assessment & Plan Note (Signed)
Worsening back pain with progression of L3 compression fracture by latest lumbar xray 02/19/2016. Will refer back to neurosurgery for re eval of therapeutic vertebral procedure. Pt agrees.

## 2016-02-24 NOTE — Patient Instructions (Addendum)
Continue scheduled tylenol 1000mg  three times daily.  Take hydrocodone or tramadol for breakthrough pain. Continue miralax daily. When you run out of dulcolax, start sennakot daily while on stronger pain medicine.   Keep appointment with Dr Christella Noa and update me with plan - try to move appointment to this week if able.

## 2016-02-26 DIAGNOSIS — S32030A Wedge compression fracture of third lumbar vertebra, initial encounter for closed fracture: Secondary | ICD-10-CM | POA: Diagnosis not present

## 2016-02-26 DIAGNOSIS — I10 Essential (primary) hypertension: Secondary | ICD-10-CM | POA: Diagnosis not present

## 2016-02-27 ENCOUNTER — Telehealth: Payer: Self-pay

## 2016-02-27 ENCOUNTER — Ambulatory Visit: Payer: Medicare Other | Admitting: Family Medicine

## 2016-02-27 DIAGNOSIS — I50810 Right heart failure, unspecified: Secondary | ICD-10-CM

## 2016-02-27 DIAGNOSIS — IMO0001 Reserved for inherently not codable concepts without codable children: Secondary | ICD-10-CM

## 2016-02-27 DIAGNOSIS — M4850XG Collapsed vertebra, not elsewhere classified, site unspecified, subsequent encounter for fracture with delayed healing: Principal | ICD-10-CM

## 2016-02-27 DIAGNOSIS — M5441 Lumbago with sciatica, right side: Secondary | ICD-10-CM

## 2016-02-27 NOTE — Telephone Encounter (Signed)
Pt left v/m requesting home health nurse to come out; pt last seen 02/24/16 with compression fx of vertebrae. Pt request cb when done.

## 2016-02-28 NOTE — Telephone Encounter (Signed)
Attempted to call patient.  Line busy.  Will try again later. 

## 2016-02-28 NOTE — Telephone Encounter (Signed)
Home health referral placed

## 2016-02-28 NOTE — Telephone Encounter (Signed)
Tried to call patient. Unable to contact. Line busy.

## 2016-02-28 NOTE — Telephone Encounter (Signed)
Patient notified and verbalized understanding. 

## 2016-03-01 DIAGNOSIS — I110 Hypertensive heart disease with heart failure: Secondary | ICD-10-CM | POA: Diagnosis not present

## 2016-03-01 DIAGNOSIS — M4850XG Collapsed vertebra, not elsewhere classified, site unspecified, subsequent encounter for fracture with delayed healing: Secondary | ICD-10-CM | POA: Diagnosis not present

## 2016-03-01 DIAGNOSIS — J984 Other disorders of lung: Secondary | ICD-10-CM | POA: Diagnosis not present

## 2016-03-01 DIAGNOSIS — E119 Type 2 diabetes mellitus without complications: Secondary | ICD-10-CM | POA: Diagnosis not present

## 2016-03-01 DIAGNOSIS — I509 Heart failure, unspecified: Secondary | ICD-10-CM | POA: Diagnosis not present

## 2016-03-01 DIAGNOSIS — M5441 Lumbago with sciatica, right side: Secondary | ICD-10-CM | POA: Diagnosis not present

## 2016-03-02 ENCOUNTER — Telehealth: Payer: Self-pay | Admitting: Family Medicine

## 2016-03-02 NOTE — Telephone Encounter (Signed)
I'm sorry she's hurting so much. Did she get new opinion from Dr Christella Noa yet about vertebroplasty or kyphoplasty? I do strongly suggest she consider this. Would she be interested in long acting narcotic to try to provide longer lasting relief?

## 2016-03-02 NOTE — Telephone Encounter (Signed)
Agree with verbal order for above. Thanks.

## 2016-03-02 NOTE — Telephone Encounter (Signed)
Pt calling to say she has been on a lot of medication to keep the pain down.  She wants to know if she should add something else to her meds because she is in so much pain currently.  She doesn't know what to do.

## 2016-03-02 NOTE — Telephone Encounter (Signed)
[name] from [facility] needs verbal orders for a nurse to see pt for medication management and disease 2 times per week for 3 weeks and 1 time per week for 4 weeks with 2 prn visits.  Also a Education officer, museum order to assist with community resources with healthcare power of attorney change assistance  [phone] [name])

## 2016-03-02 NOTE — Telephone Encounter (Signed)
LMTRC

## 2016-03-03 DIAGNOSIS — M4850XG Collapsed vertebra, not elsewhere classified, site unspecified, subsequent encounter for fracture with delayed healing: Secondary | ICD-10-CM | POA: Diagnosis not present

## 2016-03-03 DIAGNOSIS — I110 Hypertensive heart disease with heart failure: Secondary | ICD-10-CM | POA: Diagnosis not present

## 2016-03-03 DIAGNOSIS — I509 Heart failure, unspecified: Secondary | ICD-10-CM | POA: Diagnosis not present

## 2016-03-03 DIAGNOSIS — M5441 Lumbago with sciatica, right side: Secondary | ICD-10-CM | POA: Diagnosis not present

## 2016-03-03 DIAGNOSIS — E119 Type 2 diabetes mellitus without complications: Secondary | ICD-10-CM | POA: Diagnosis not present

## 2016-03-03 DIAGNOSIS — J984 Other disorders of lung: Secondary | ICD-10-CM | POA: Diagnosis not present

## 2016-03-03 NOTE — Telephone Encounter (Signed)
Pt is willing to try long acting narcotic. She said she went to see Dr. Christella Noa but she doesn't remember what he told her.

## 2016-03-03 NOTE — Telephone Encounter (Signed)
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 Pt request cb.

## 2016-03-04 ENCOUNTER — Ambulatory Visit (INDEPENDENT_AMBULATORY_CARE_PROVIDER_SITE_OTHER): Payer: Medicare Other | Admitting: *Deleted

## 2016-03-04 DIAGNOSIS — J984 Other disorders of lung: Secondary | ICD-10-CM | POA: Diagnosis not present

## 2016-03-04 DIAGNOSIS — E119 Type 2 diabetes mellitus without complications: Secondary | ICD-10-CM | POA: Diagnosis not present

## 2016-03-04 DIAGNOSIS — M4850XG Collapsed vertebra, not elsewhere classified, site unspecified, subsequent encounter for fracture with delayed healing: Secondary | ICD-10-CM | POA: Diagnosis not present

## 2016-03-04 DIAGNOSIS — Z5181 Encounter for therapeutic drug level monitoring: Secondary | ICD-10-CM

## 2016-03-04 DIAGNOSIS — I110 Hypertensive heart disease with heart failure: Secondary | ICD-10-CM | POA: Diagnosis not present

## 2016-03-04 DIAGNOSIS — I481 Persistent atrial fibrillation: Secondary | ICD-10-CM

## 2016-03-04 DIAGNOSIS — I509 Heart failure, unspecified: Secondary | ICD-10-CM | POA: Diagnosis not present

## 2016-03-04 DIAGNOSIS — M5441 Lumbago with sciatica, right side: Secondary | ICD-10-CM | POA: Diagnosis not present

## 2016-03-04 DIAGNOSIS — I4819 Other persistent atrial fibrillation: Secondary | ICD-10-CM

## 2016-03-04 LAB — POCT INR: INR: 2.2

## 2016-03-04 MED ORDER — OXYCODONE HCL ER 10 MG PO T12A: 10.0000 mg | EXTENDED_RELEASE_TABLET | Freq: Two times a day (BID) | ORAL | 0 refills | Status: DC

## 2016-03-04 NOTE — Telephone Encounter (Signed)
We could try long acting oxycodone (oxycontin). Rx printed and in Kim's box - at lowest dose to start once daily.  Start with oxycontin, may take tramadol for breakthrough pain afterwards. Continue scheduled tylenol.  Let me know if she agrees to try this. Keep f/u appt next week. Lab Results  Component Value Date   CREATININE 0.53 02/19/2016

## 2016-03-04 NOTE — Telephone Encounter (Signed)
Message left advising Estill Bamberg.

## 2016-03-04 NOTE — Telephone Encounter (Signed)
Patient notified and is agreeable to trying med. Placed up front for pick up.

## 2016-03-05 DIAGNOSIS — E119 Type 2 diabetes mellitus without complications: Secondary | ICD-10-CM | POA: Diagnosis not present

## 2016-03-05 DIAGNOSIS — M5441 Lumbago with sciatica, right side: Secondary | ICD-10-CM | POA: Diagnosis not present

## 2016-03-05 DIAGNOSIS — J984 Other disorders of lung: Secondary | ICD-10-CM | POA: Diagnosis not present

## 2016-03-05 DIAGNOSIS — I110 Hypertensive heart disease with heart failure: Secondary | ICD-10-CM | POA: Diagnosis not present

## 2016-03-05 DIAGNOSIS — M4850XG Collapsed vertebra, not elsewhere classified, site unspecified, subsequent encounter for fracture with delayed healing: Secondary | ICD-10-CM | POA: Diagnosis not present

## 2016-03-05 DIAGNOSIS — I509 Heart failure, unspecified: Secondary | ICD-10-CM | POA: Diagnosis not present

## 2016-03-06 DIAGNOSIS — J984 Other disorders of lung: Secondary | ICD-10-CM | POA: Diagnosis not present

## 2016-03-06 DIAGNOSIS — M4850XG Collapsed vertebra, not elsewhere classified, site unspecified, subsequent encounter for fracture with delayed healing: Secondary | ICD-10-CM | POA: Diagnosis not present

## 2016-03-06 DIAGNOSIS — E119 Type 2 diabetes mellitus without complications: Secondary | ICD-10-CM | POA: Diagnosis not present

## 2016-03-06 DIAGNOSIS — I509 Heart failure, unspecified: Secondary | ICD-10-CM | POA: Diagnosis not present

## 2016-03-06 DIAGNOSIS — I110 Hypertensive heart disease with heart failure: Secondary | ICD-10-CM | POA: Diagnosis not present

## 2016-03-06 DIAGNOSIS — M5441 Lumbago with sciatica, right side: Secondary | ICD-10-CM | POA: Diagnosis not present

## 2016-03-09 ENCOUNTER — Ambulatory Visit: Payer: Medicare Other | Admitting: Family Medicine

## 2016-03-10 ENCOUNTER — Ambulatory Visit (INDEPENDENT_AMBULATORY_CARE_PROVIDER_SITE_OTHER): Payer: Medicare Other | Admitting: Family Medicine

## 2016-03-10 ENCOUNTER — Encounter: Payer: Self-pay | Admitting: Family Medicine

## 2016-03-10 VITALS — BP 140/86 | HR 96 | Wt 119.0 lb

## 2016-03-10 DIAGNOSIS — I110 Hypertensive heart disease with heart failure: Secondary | ICD-10-CM | POA: Diagnosis not present

## 2016-03-10 DIAGNOSIS — J984 Other disorders of lung: Secondary | ICD-10-CM | POA: Diagnosis not present

## 2016-03-10 DIAGNOSIS — M5441 Lumbago with sciatica, right side: Secondary | ICD-10-CM | POA: Diagnosis not present

## 2016-03-10 DIAGNOSIS — I4819 Other persistent atrial fibrillation: Secondary | ICD-10-CM

## 2016-03-10 DIAGNOSIS — K5900 Constipation, unspecified: Secondary | ICD-10-CM

## 2016-03-10 DIAGNOSIS — M4850XG Collapsed vertebra, not elsewhere classified, site unspecified, subsequent encounter for fracture with delayed healing: Secondary | ICD-10-CM

## 2016-03-10 DIAGNOSIS — F418 Other specified anxiety disorders: Secondary | ICD-10-CM

## 2016-03-10 DIAGNOSIS — E119 Type 2 diabetes mellitus without complications: Secondary | ICD-10-CM | POA: Diagnosis not present

## 2016-03-10 DIAGNOSIS — I481 Persistent atrial fibrillation: Secondary | ICD-10-CM | POA: Diagnosis not present

## 2016-03-10 DIAGNOSIS — M810 Age-related osteoporosis without current pathological fracture: Secondary | ICD-10-CM | POA: Diagnosis not present

## 2016-03-10 DIAGNOSIS — I509 Heart failure, unspecified: Secondary | ICD-10-CM | POA: Diagnosis not present

## 2016-03-10 DIAGNOSIS — IMO0001 Reserved for inherently not codable concepts without codable children: Secondary | ICD-10-CM

## 2016-03-10 MED ORDER — OXYCODONE HCL ER 10 MG PO T12A: 10.0000 mg | EXTENDED_RELEASE_TABLET | Freq: Two times a day (BID) | ORAL | 0 refills | Status: DC

## 2016-03-10 NOTE — Assessment & Plan Note (Signed)
Continue calcitonin, hold boniva.

## 2016-03-10 NOTE — Assessment & Plan Note (Signed)
longterm on sertraline 50mg  and klonopin 0.5mg  nightly. Pt is slowly tapering down on klonopin use (1/2 tab nightly now)

## 2016-03-10 NOTE — Assessment & Plan Note (Signed)
Chronic. Continue coumadin.

## 2016-03-10 NOTE — Assessment & Plan Note (Signed)
Reviewed bowel regimen: miralax 1 capful daily, senna/docusate 1 tablet daily.

## 2016-03-10 NOTE — Patient Instructions (Addendum)
We will refer you today to interventional radiology to discuss possible procedure, as well as palliative care.  I've refilled oxycontin.  Pain regimen is oxycontin CR 10mg  twice daily, tylenol 1000mg  three times daily, tramadol for breakthrough pain.  Bowel regimen is miralax 1 capful daily and sennakot 1 capsule daily.  Return in 2-3 wks for folllow up visit.

## 2016-03-10 NOTE — Addendum Note (Signed)
Addended by: Ria Bush on: 03/10/2016 05:31 PM   Modules accepted: Orders

## 2016-03-10 NOTE — Progress Notes (Signed)
BP 140/86   Pulse 96   Wt 119 lb (54 kg)   SpO2 93%   BMI 20.43 kg/m    CC: f/u visit "I want hospice referral" Subjective:    Patient ID: [name], female    DOB: September 11, 1925, 82 y.o.   MRN: [id number]  HPI: [name] is a 82 y.o. female presenting on 03/10/2016 for Follow-up   Here with husband and son today.  See prior note for details. Severe lower back pain due to acute and chronic compression fractures + spondylosis. Saw neurosurgery for progressively worsening L3 compression fracture - rec against vertebral augmentation surgery due to coumadin use. Continues calcitonin nasal spray, tylenol 1000mg  tid, tramadol, hydrocodone PRN. We started oxycontin last visit to take 10mg  once daily - she has been taking twice daily.   HHPT involved.   Bowel regimen - miralax continued, senna/docusate started.   On coumadin for h/o paroxysmal atrial fibrillation. Remote h/o TIA.   Relevant past medical, surgical, family and social history reviewed and updated as indicated. Interim medical history since our last visit reviewed. Allergies and medications reviewed and updated. Current Outpatient Prescriptions on File Prior to Visit  Medication Sig  . acetaminophen (TYLENOL) 500 MG tablet Take 2 tablets (1,000 mg total) by mouth 3 (three) times daily.  . calcitonin, salmon, (MIACALCIN/FORTICAL) 200 UNIT/ACT nasal spray Place 1 spray into alternate nostrils daily.  . Calcium-Magnesium-Vitamin D [id number] MG-MG-UNIT TABS Take 1 tablet by mouth 2 (two) times daily.  . clonazePAM (KLONOPIN) 0.5 MG tablet Take 1 tablet (0.5 mg total) by mouth at bedtime as needed for anxiety.  . felodipine (PLENDIL) 2.5 MG 24 hr tablet Take 1 tablet (2.5 mg total) by mouth daily.  . ferrous sulfate 325 (65 FE) MG tablet Take 325 mg by mouth daily with breakfast.  . furosemide (LASIX) 20 MG tablet Take 40 mg by mouth daily as needed for fluid.   Marland Kitchen latanoprost (XALATAN) 0.005 % ophthalmic solution  Place 1 drop into both eyes at bedtime.   Marland Kitchen levothyroxine (SYNTHROID, LEVOTHROID) 50 MCG tablet TAKE 1 TABLET DAILY  . Multiple Vitamin (MULTIVITAMIN) capsule Take 1 capsule by mouth every morning.   . Multiple Vitamins-Minerals (ICAPS AREDS FORMULA PO) Take 1 tablet by mouth daily.   . Omega-3 Fatty Acids (FISH OIL CONCENTRATE) 1000 MG CAPS Take 2 capsules by mouth 2 (two) times daily.    . polyethylene glycol (MIRALAX / GLYCOLAX) packet Take 17 g by mouth daily.  . potassium chloride SA (K-DUR,KLOR-CON) 20 MEQ tablet Take 1 tablet (20 mEq total) by mouth daily as needed. (Patient taking differently: Take 20 mEq by mouth daily as needed (along with Lasix). )  . sennosides-docusate sodium (SENOKOT-S) 8.6-50 MG tablet Take 1 tablet by mouth daily.  . sertraline (ZOLOFT) 50 MG tablet Take 1 tablet (50 mg total) by mouth daily.  . traMADol (ULTRAM) 50 MG tablet Take 1 tablet (50 mg total) by mouth every 8 (eight) hours as needed for moderate pain.  . vitamin B-12 (CYANOCOBALAMIN) 500 MCG tablet Take 500 mcg by mouth daily.  Marland Kitchen warfarin (COUMADIN) 2.5 MG tablet Take 2.5-3.75 mg by mouth daily. Take 2.5 mg every day except 3.75 mg on Monday's.  . [DISCONTINUED] Calcium Carbonate-Vitamin D (CALCIUM PLUS VITAMIN D PO) Take 2 tablets by mouth daily.     No current facility-administered medications on file prior to visit.     Review of Systems Per HPI unless specifically indicated in ROS section  Objective:    BP 140/86   Pulse 96   Wt 119 lb (54 kg)   SpO2 93%   BMI 20.43 kg/m   Wt Readings from Last 3 Encounters:  03/10/16 119 lb (54 kg)  02/24/16 117 lb 4 oz (53.2 kg)  02/22/16 118 lb (53.5 kg)    Physical Exam  Constitutional: She appears well-developed and well-nourished. No distress.  Ambulates with walker Antalgic gait.  Musculoskeletal:  Lumbar brace in place  Nursing note and vitals reviewed.  Results for orders placed or performed in visit on 03/04/16  POCT INR  Result  Value Ref Range   INR 2.2       Assessment & Plan:   Problem List Items Addressed This Visit    Compression fracture of vertebrae ([place]) - Primary    Ongoing marked disability from pain due to progressive L3 compression fracture. More trouble with ADLs with patient and husband (h/o dementia).  Increased confusion noted over medication regimen. Extensive med rec performed with patient and son.  Simplified pain regimen: oxycontin 10mg  BID, tylenol 1000mg  TID, and breakthrough tramadol. Will remove hydrocodone from med list.  Will refer to IR for second opinion on vertebral augmentation and per pt request will also refer to palliative care for evaluation.       Relevant Orders   Amb Referral to Palliative Care   Ambulatory referral to Interventional Radiology   Constipation    Reviewed bowel regimen: miralax 1 capful daily, senna/docusate 1 tablet daily.       Depression with anxiety    longterm on sertraline 50mg  and klonopin 0.5mg  nightly. Pt is slowly tapering down on klonopin use (1/2 tab nightly now)      Osteoporosis    Continue calcitonin, hold boniva.       Persistent atrial fibrillation (HCC)    Chronic. Continue coumadin.        Other Visit Diagnoses   None.      Follow up plan: Return in about 3 weeks (around 03/31/2016) for follow up visit.  Ria Bush, MD

## 2016-03-10 NOTE — Progress Notes (Signed)
Pre visit review using our clinic review tool, if applicable. No additional management support is needed unless otherwise documented below in the visit note. 

## 2016-03-10 NOTE — Assessment & Plan Note (Signed)
Ongoing marked disability from pain due to progressive L3 compression fracture. More trouble with ADLs with patient and husband (h/o dementia).  Increased confusion noted over medication regimen. Extensive med rec performed with patient and son.  Simplified pain regimen: oxycontin 10mg  BID, tylenol 1000mg  TID, and breakthrough tramadol. Will remove hydrocodone from med list.  Will refer to IR for second opinion on vertebral augmentation and per pt request will also refer to palliative care for evaluation.

## 2016-03-11 ENCOUNTER — Telehealth: Payer: Self-pay

## 2016-03-11 ENCOUNTER — Ambulatory Visit (INDEPENDENT_AMBULATORY_CARE_PROVIDER_SITE_OTHER): Payer: Medicare Other | Admitting: *Deleted

## 2016-03-11 DIAGNOSIS — M5441 Lumbago with sciatica, right side: Secondary | ICD-10-CM | POA: Diagnosis not present

## 2016-03-11 DIAGNOSIS — G458 Other transient cerebral ischemic attacks and related syndromes: Secondary | ICD-10-CM

## 2016-03-11 DIAGNOSIS — Z5181 Encounter for therapeutic drug level monitoring: Secondary | ICD-10-CM

## 2016-03-11 DIAGNOSIS — J984 Other disorders of lung: Secondary | ICD-10-CM | POA: Diagnosis not present

## 2016-03-11 DIAGNOSIS — M4850XG Collapsed vertebra, not elsewhere classified, site unspecified, subsequent encounter for fracture with delayed healing: Secondary | ICD-10-CM | POA: Diagnosis not present

## 2016-03-11 DIAGNOSIS — I4819 Other persistent atrial fibrillation: Secondary | ICD-10-CM

## 2016-03-11 DIAGNOSIS — I509 Heart failure, unspecified: Secondary | ICD-10-CM | POA: Diagnosis not present

## 2016-03-11 DIAGNOSIS — I481 Persistent atrial fibrillation: Secondary | ICD-10-CM

## 2016-03-11 DIAGNOSIS — E119 Type 2 diabetes mellitus without complications: Secondary | ICD-10-CM | POA: Diagnosis not present

## 2016-03-11 DIAGNOSIS — I110 Hypertensive heart disease with heart failure: Secondary | ICD-10-CM | POA: Diagnosis not present

## 2016-03-11 LAB — POCT INR: INR: 1.8

## 2016-03-11 NOTE — Telephone Encounter (Signed)
[name] with [name] left v/m requesting verbal order for [name] aide 2 x a week for 4 weeks.

## 2016-03-11 NOTE — Telephone Encounter (Signed)
Jennifer notified.

## 2016-03-11 NOTE — Telephone Encounter (Signed)
Agree to this. Thanks.

## 2016-03-12 ENCOUNTER — Telehealth: Payer: Self-pay

## 2016-03-12 DIAGNOSIS — J984 Other disorders of lung: Secondary | ICD-10-CM | POA: Diagnosis not present

## 2016-03-12 DIAGNOSIS — M5441 Lumbago with sciatica, right side: Secondary | ICD-10-CM | POA: Diagnosis not present

## 2016-03-12 DIAGNOSIS — I509 Heart failure, unspecified: Secondary | ICD-10-CM | POA: Diagnosis not present

## 2016-03-12 DIAGNOSIS — E119 Type 2 diabetes mellitus without complications: Secondary | ICD-10-CM | POA: Diagnosis not present

## 2016-03-12 DIAGNOSIS — M4850XG Collapsed vertebra, not elsewhere classified, site unspecified, subsequent encounter for fracture with delayed healing: Secondary | ICD-10-CM | POA: Diagnosis not present

## 2016-03-12 DIAGNOSIS — I110 Hypertensive heart disease with heart failure: Secondary | ICD-10-CM | POA: Diagnosis not present

## 2016-03-12 NOTE — Telephone Encounter (Signed)
V/M left; pt has upcoming appt with Dr Christella Noa and wants to know if pt should keep that appt or wait on appt for 2nd opinion about back pain with IR.

## 2016-03-12 NOTE — Telephone Encounter (Signed)
I don't have latest neurosurgery note to review, but as pt said Dr Christella Noa would'nt do surgery I think would be reasonable to cancel and await IR eval.

## 2016-03-13 DIAGNOSIS — I110 Hypertensive heart disease with heart failure: Secondary | ICD-10-CM | POA: Diagnosis not present

## 2016-03-13 DIAGNOSIS — I509 Heart failure, unspecified: Secondary | ICD-10-CM | POA: Diagnosis not present

## 2016-03-13 DIAGNOSIS — M5441 Lumbago with sciatica, right side: Secondary | ICD-10-CM | POA: Diagnosis not present

## 2016-03-13 DIAGNOSIS — J984 Other disorders of lung: Secondary | ICD-10-CM | POA: Diagnosis not present

## 2016-03-13 DIAGNOSIS — E119 Type 2 diabetes mellitus without complications: Secondary | ICD-10-CM | POA: Diagnosis not present

## 2016-03-13 DIAGNOSIS — M4850XG Collapsed vertebra, not elsewhere classified, site unspecified, subsequent encounter for fracture with delayed healing: Secondary | ICD-10-CM | POA: Diagnosis not present

## 2016-03-13 NOTE — Telephone Encounter (Signed)
Patient notified

## 2016-03-13 NOTE — Telephone Encounter (Signed)
Attempted to call patient. Line was busy. Will try again later.

## 2016-03-17 DIAGNOSIS — M4850XG Collapsed vertebra, not elsewhere classified, site unspecified, subsequent encounter for fracture with delayed healing: Secondary | ICD-10-CM | POA: Diagnosis not present

## 2016-03-17 DIAGNOSIS — J984 Other disorders of lung: Secondary | ICD-10-CM | POA: Diagnosis not present

## 2016-03-17 DIAGNOSIS — I110 Hypertensive heart disease with heart failure: Secondary | ICD-10-CM | POA: Diagnosis not present

## 2016-03-17 DIAGNOSIS — M5441 Lumbago with sciatica, right side: Secondary | ICD-10-CM | POA: Diagnosis not present

## 2016-03-17 DIAGNOSIS — I509 Heart failure, unspecified: Secondary | ICD-10-CM | POA: Diagnosis not present

## 2016-03-17 DIAGNOSIS — E119 Type 2 diabetes mellitus without complications: Secondary | ICD-10-CM | POA: Diagnosis not present

## 2016-03-18 DIAGNOSIS — I110 Hypertensive heart disease with heart failure: Secondary | ICD-10-CM | POA: Diagnosis not present

## 2016-03-18 DIAGNOSIS — J984 Other disorders of lung: Secondary | ICD-10-CM | POA: Diagnosis not present

## 2016-03-18 DIAGNOSIS — M4850XG Collapsed vertebra, not elsewhere classified, site unspecified, subsequent encounter for fracture with delayed healing: Secondary | ICD-10-CM | POA: Diagnosis not present

## 2016-03-18 DIAGNOSIS — E119 Type 2 diabetes mellitus without complications: Secondary | ICD-10-CM | POA: Diagnosis not present

## 2016-03-18 DIAGNOSIS — I509 Heart failure, unspecified: Secondary | ICD-10-CM | POA: Diagnosis not present

## 2016-03-18 DIAGNOSIS — M5441 Lumbago with sciatica, right side: Secondary | ICD-10-CM | POA: Diagnosis not present

## 2016-03-19 ENCOUNTER — Ambulatory Visit (INDEPENDENT_AMBULATORY_CARE_PROVIDER_SITE_OTHER): Payer: Medicare Other | Admitting: *Deleted

## 2016-03-19 ENCOUNTER — Telehealth: Payer: Self-pay | Admitting: Cardiology

## 2016-03-19 ENCOUNTER — Other Ambulatory Visit: Payer: Self-pay

## 2016-03-19 DIAGNOSIS — M4850XG Collapsed vertebra, not elsewhere classified, site unspecified, subsequent encounter for fracture with delayed healing: Secondary | ICD-10-CM | POA: Diagnosis not present

## 2016-03-19 DIAGNOSIS — I481 Persistent atrial fibrillation: Secondary | ICD-10-CM

## 2016-03-19 DIAGNOSIS — E119 Type 2 diabetes mellitus without complications: Secondary | ICD-10-CM | POA: Diagnosis not present

## 2016-03-19 DIAGNOSIS — I4819 Other persistent atrial fibrillation: Secondary | ICD-10-CM

## 2016-03-19 DIAGNOSIS — G459 Transient cerebral ischemic attack, unspecified: Secondary | ICD-10-CM

## 2016-03-19 DIAGNOSIS — I110 Hypertensive heart disease with heart failure: Secondary | ICD-10-CM | POA: Diagnosis not present

## 2016-03-19 DIAGNOSIS — J984 Other disorders of lung: Secondary | ICD-10-CM | POA: Diagnosis not present

## 2016-03-19 DIAGNOSIS — M5441 Lumbago with sciatica, right side: Secondary | ICD-10-CM | POA: Diagnosis not present

## 2016-03-19 DIAGNOSIS — I509 Heart failure, unspecified: Secondary | ICD-10-CM | POA: Diagnosis not present

## 2016-03-19 DIAGNOSIS — Z5181 Encounter for therapeutic drug level monitoring: Secondary | ICD-10-CM

## 2016-03-19 LAB — POCT INR: INR: 2.8

## 2016-03-19 NOTE — Telephone Encounter (Signed)
Done.  See coumadin note. 

## 2016-03-19 NOTE — Telephone Encounter (Signed)
Pt left v/m requesting rx oxycodone. Call when ready for pick up. Pt last seen and rx printed # 60 on 03/10/16 with note not to fill until 03/18/16.Please advise.

## 2016-03-19 NOTE — Telephone Encounter (Signed)
Crystal with [facility] called in regards to Mrs. Cermak INR.   2.8  Please call [phone]

## 2016-03-20 NOTE — Telephone Encounter (Signed)
Pt called and pt found oxycodone rx at home; nothing further needed.

## 2016-03-20 NOTE — Telephone Encounter (Signed)
plz check with patient - we printed Rx for oxycontin 8/22 to fill 8/30  She should have at home.

## 2016-03-21 DIAGNOSIS — M4850XG Collapsed vertebra, not elsewhere classified, site unspecified, subsequent encounter for fracture with delayed healing: Secondary | ICD-10-CM | POA: Diagnosis not present

## 2016-03-21 DIAGNOSIS — E119 Type 2 diabetes mellitus without complications: Secondary | ICD-10-CM

## 2016-03-21 DIAGNOSIS — J984 Other disorders of lung: Secondary | ICD-10-CM

## 2016-03-21 DIAGNOSIS — I110 Hypertensive heart disease with heart failure: Secondary | ICD-10-CM | POA: Diagnosis not present

## 2016-03-21 DIAGNOSIS — I509 Heart failure, unspecified: Secondary | ICD-10-CM | POA: Diagnosis not present

## 2016-03-21 DIAGNOSIS — M5441 Lumbago with sciatica, right side: Secondary | ICD-10-CM | POA: Diagnosis not present

## 2016-03-24 ENCOUNTER — Ambulatory Visit: Payer: Medicare Other | Admitting: Family Medicine

## 2016-03-24 DIAGNOSIS — M4850XG Collapsed vertebra, not elsewhere classified, site unspecified, subsequent encounter for fracture with delayed healing: Secondary | ICD-10-CM | POA: Diagnosis not present

## 2016-03-24 DIAGNOSIS — I110 Hypertensive heart disease with heart failure: Secondary | ICD-10-CM | POA: Diagnosis not present

## 2016-03-24 DIAGNOSIS — J984 Other disorders of lung: Secondary | ICD-10-CM | POA: Diagnosis not present

## 2016-03-24 DIAGNOSIS — I509 Heart failure, unspecified: Secondary | ICD-10-CM | POA: Diagnosis not present

## 2016-03-24 DIAGNOSIS — M5441 Lumbago with sciatica, right side: Secondary | ICD-10-CM | POA: Diagnosis not present

## 2016-03-24 DIAGNOSIS — E119 Type 2 diabetes mellitus without complications: Secondary | ICD-10-CM | POA: Diagnosis not present

## 2016-03-25 DIAGNOSIS — J984 Other disorders of lung: Secondary | ICD-10-CM | POA: Diagnosis not present

## 2016-03-25 DIAGNOSIS — E119 Type 2 diabetes mellitus without complications: Secondary | ICD-10-CM | POA: Diagnosis not present

## 2016-03-25 DIAGNOSIS — I509 Heart failure, unspecified: Secondary | ICD-10-CM | POA: Diagnosis not present

## 2016-03-25 DIAGNOSIS — I110 Hypertensive heart disease with heart failure: Secondary | ICD-10-CM | POA: Diagnosis not present

## 2016-03-25 DIAGNOSIS — M4850XG Collapsed vertebra, not elsewhere classified, site unspecified, subsequent encounter for fracture with delayed healing: Secondary | ICD-10-CM | POA: Diagnosis not present

## 2016-03-25 DIAGNOSIS — M5441 Lumbago with sciatica, right side: Secondary | ICD-10-CM | POA: Diagnosis not present

## 2016-03-26 DIAGNOSIS — I110 Hypertensive heart disease with heart failure: Secondary | ICD-10-CM | POA: Diagnosis not present

## 2016-03-26 DIAGNOSIS — M4850XG Collapsed vertebra, not elsewhere classified, site unspecified, subsequent encounter for fracture with delayed healing: Secondary | ICD-10-CM | POA: Diagnosis not present

## 2016-03-26 DIAGNOSIS — I509 Heart failure, unspecified: Secondary | ICD-10-CM | POA: Diagnosis not present

## 2016-03-26 DIAGNOSIS — M5441 Lumbago with sciatica, right side: Secondary | ICD-10-CM | POA: Diagnosis not present

## 2016-03-26 DIAGNOSIS — E119 Type 2 diabetes mellitus without complications: Secondary | ICD-10-CM | POA: Diagnosis not present

## 2016-03-26 DIAGNOSIS — J984 Other disorders of lung: Secondary | ICD-10-CM | POA: Diagnosis not present

## 2016-03-31 ENCOUNTER — Ambulatory Visit (INDEPENDENT_AMBULATORY_CARE_PROVIDER_SITE_OTHER): Payer: Medicare Other | Admitting: Family Medicine

## 2016-03-31 ENCOUNTER — Encounter: Payer: Self-pay | Admitting: Family Medicine

## 2016-03-31 VITALS — BP 136/82 | HR 64 | Temp 97.8°F | Wt 115.5 lb

## 2016-03-31 DIAGNOSIS — Z23 Encounter for immunization: Secondary | ICD-10-CM | POA: Diagnosis not present

## 2016-03-31 DIAGNOSIS — M4850XG Collapsed vertebra, not elsewhere classified, site unspecified, subsequent encounter for fracture with delayed healing: Secondary | ICD-10-CM | POA: Diagnosis not present

## 2016-03-31 DIAGNOSIS — K5900 Constipation, unspecified: Secondary | ICD-10-CM | POA: Diagnosis not present

## 2016-03-31 DIAGNOSIS — IMO0001 Reserved for inherently not codable concepts without codable children: Secondary | ICD-10-CM

## 2016-03-31 MED ORDER — LIDOCAINE 5 % EX PTCH: 1.0000 | MEDICATED_PATCH | CUTANEOUS | 0 refills | Status: DC

## 2016-03-31 NOTE — Assessment & Plan Note (Addendum)
Somewhere ball was dropped for IR referral for second opinion for vertebral augmentation. I will ask patient to see Rosaria Ferries our referral coordinator today.  Persistent uncontrolled pain despite oxycontin 10mg  bid and tylenol 1000mg  TID. Will also schedule tramadol 25mg  BID, advised pt to update me with effect and will consider continued titration of pain regimen as needed.  Continue calcitonin nasal spray.

## 2016-03-31 NOTE — Assessment & Plan Note (Signed)
Stable on current bowel regimen of miralax 1 capful daily, senna/docusate 1 tab daily.

## 2016-03-31 NOTE — Progress Notes (Signed)
Pre visit review using our clinic review tool, if applicable. No additional management support is needed unless otherwise documented below in the visit note. 

## 2016-03-31 NOTE — Patient Instructions (Addendum)
Flu shot today. See Rosaria Ferries to inquire about IR referral (placed 8/22).  Continue oxycontin 10mg  twice daily, tylenol 1000mg  three times daily, start tramadol 1/2 tablet (25mg  twice daily scheduled). Update me with effect of scheduled tramadol.  Try lidocaine patch to back.  Return in 3 weeks for follow up visit.

## 2016-03-31 NOTE — Progress Notes (Signed)
BP 136/82   Pulse 64   Temp 97.8 F (36.6 C) (Oral)   Wt 115 lb 8 oz (52.4 kg)   BMI 19.83 kg/m    CC: 3 wk f/u visit Subjective:    Patient ID: [name], female    DOB: August 22, 1925, 82 y.o.   MRN: [id number]   HPI: SUMAIYA STRIDE is a 82 y.o. female presenting on 03/31/2016 for Follow-up   Ongoing marked disability from pain due to progressive L3 compression fracture. S/p Dr Christella Noa eval, rec against vertebral procedure due to coumadin risk. Referred to IR for second opinion of vertebral augmentation   Pain regimen: oxycontin 10mg  BID, tylenol 1000mg  TID, and breakthrough tramadol.  Bowel regimen: miralax 1 capful daily, senna/docusate 1 tablet daily.   HH involved. Palliative care referral also placed per patient request.   Relevant past medical, surgical, family and social history reviewed and updated as indicated. Interim medical history since our last visit reviewed. Allergies and medications reviewed and updated. Current Outpatient Prescriptions on File Prior to Visit  Medication Sig  . acetaminophen (TYLENOL) 500 MG tablet Take 2 tablets (1,000 mg total) by mouth 3 (three) times daily.  . calcitonin, salmon, (MIACALCIN/FORTICAL) 200 UNIT/ACT nasal spray Place 1 spray into alternate nostrils daily.  . Calcium-Magnesium-Vitamin D [id number] MG-MG-UNIT TABS Take 1 tablet by mouth 2 (two) times daily.  . clonazePAM (KLONOPIN) 0.5 MG tablet Take 1 tablet (0.5 mg total) by mouth at bedtime as needed for anxiety.  . felodipine (PLENDIL) 2.5 MG 24 hr tablet Take 1 tablet (2.5 mg total) by mouth daily.  . ferrous sulfate 325 (65 FE) MG tablet Take 325 mg by mouth daily with breakfast.  . furosemide (LASIX) 20 MG tablet Take 40 mg by mouth daily as needed for fluid.   Marland Kitchen latanoprost (XALATAN) 0.005 % ophthalmic solution Place 1 drop into both eyes at bedtime.   Marland Kitchen levothyroxine (SYNTHROID, LEVOTHROID) 50 MCG tablet TAKE 1 TABLET DAILY  . Multiple Vitamin (MULTIVITAMIN)  capsule Take 1 capsule by mouth every morning.   . Multiple Vitamins-Minerals (ICAPS AREDS FORMULA PO) Take 1 tablet by mouth daily.   . Omega-3 Fatty Acids (FISH OIL CONCENTRATE) 1000 MG CAPS Take 2 capsules by mouth 2 (two) times daily.    Marland Kitchen oxyCODONE (OXYCONTIN) 10 mg 12 hr tablet Take 1 tablet (10 mg total) by mouth every 12 (twelve) hours.  . polyethylene glycol (MIRALAX / GLYCOLAX) packet Take 17 g by mouth daily.  . potassium chloride SA (K-DUR,KLOR-CON) 20 MEQ tablet Take 1 tablet (20 mEq total) by mouth daily as needed. (Patient taking differently: Take 20 mEq by mouth daily as needed (along with Lasix). )  . sennosides-docusate sodium (SENOKOT-S) 8.6-50 MG tablet Take 1 tablet by mouth daily.  . sertraline (ZOLOFT) 50 MG tablet Take 1 tablet (50 mg total) by mouth daily.  . traMADol (ULTRAM) 50 MG tablet Take 1 tablet (50 mg total) by mouth every 8 (eight) hours as needed for moderate pain.  . vitamin B-12 (CYANOCOBALAMIN) 500 MCG tablet Take 500 mcg by mouth daily.  Marland Kitchen warfarin (COUMADIN) 2.5 MG tablet Take 2.5-3.75 mg by mouth daily. Take 2.5 mg every day except 3.75 mg on Monday's.  . [DISCONTINUED] Calcium Carbonate-Vitamin D (CALCIUM PLUS VITAMIN D PO) Take 2 tablets by mouth daily.     No current facility-administered medications on file prior to visit.     Review of Systems Per HPI unless specifically indicated in ROS section  Objective:    BP 136/82   Pulse 64   Temp 97.8 F (36.6 C) (Oral)   Wt 115 lb 8 oz (52.4 kg)   BMI 19.83 kg/m   Wt Readings from Last 3 Encounters:  03/31/16 115 lb 8 oz (52.4 kg)  03/10/16 119 lb (54 kg)  02/24/16 117 lb 4 oz (53.2 kg)    Physical Exam  Constitutional: She appears well-developed and well-nourished. No distress.  Musculoskeletal: She exhibits no edema.  Does not have lumbar support brace in place. Tender to palpation midline lower lumbar region. No pain to palpation lateral hip or sciatic notch  Nursing note and vitals  reviewed.  Results for orders placed or performed in visit on 03/19/16  POCT INR  Result Value Ref Range   INR 2.8       Assessment & Plan:   Problem List Items Addressed This Visit    Compression fracture of vertebrae ([place]) - Primary    Somewhere ball was dropped for IR referral for second opinion for vertebral augmentation. I will ask patient to see Rosaria Ferries our referral coordinator today.  Persistent uncontrolled pain despite oxycontin 10mg  bid and tylenol 1000mg  TID. Will also schedule tramadol 25mg  BID, advised pt to update me with effect and will consider continued titration of pain regimen as needed.  Continue calcitonin nasal spray.       Constipation    Stable on current bowel regimen of miralax 1 capful daily, senna/docusate 1 tab daily.       Other Visit Diagnoses    Need for influenza vaccination       Relevant Orders   Flu Vaccine QUAD 36+ mos PF IM (Fluarix & Fluzone Quad PF) (Completed)       Follow up plan: Return in about 3 weeks (around 04/21/2016) for follow up visit.  Ria Bush, MD

## 2016-04-02 DIAGNOSIS — M4850XG Collapsed vertebra, not elsewhere classified, site unspecified, subsequent encounter for fracture with delayed healing: Secondary | ICD-10-CM | POA: Diagnosis not present

## 2016-04-02 DIAGNOSIS — I110 Hypertensive heart disease with heart failure: Secondary | ICD-10-CM | POA: Diagnosis not present

## 2016-04-02 DIAGNOSIS — M5441 Lumbago with sciatica, right side: Secondary | ICD-10-CM | POA: Diagnosis not present

## 2016-04-02 DIAGNOSIS — I509 Heart failure, unspecified: Secondary | ICD-10-CM | POA: Diagnosis not present

## 2016-04-02 DIAGNOSIS — E119 Type 2 diabetes mellitus without complications: Secondary | ICD-10-CM | POA: Diagnosis not present

## 2016-04-02 DIAGNOSIS — J984 Other disorders of lung: Secondary | ICD-10-CM | POA: Diagnosis not present

## 2016-04-03 ENCOUNTER — Telehealth: Payer: Self-pay

## 2016-04-03 ENCOUNTER — Ambulatory Visit (INDEPENDENT_AMBULATORY_CARE_PROVIDER_SITE_OTHER): Payer: Medicare Other | Admitting: Cardiovascular Disease

## 2016-04-03 DIAGNOSIS — I4819 Other persistent atrial fibrillation: Secondary | ICD-10-CM

## 2016-04-03 DIAGNOSIS — I481 Persistent atrial fibrillation: Secondary | ICD-10-CM

## 2016-04-03 DIAGNOSIS — Z5181 Encounter for therapeutic drug level monitoring: Secondary | ICD-10-CM

## 2016-04-03 LAB — POCT INR: INR: 1.8

## 2016-04-03 NOTE — Telephone Encounter (Signed)
Pt left /vm for Dr Darnell Level; Tramadol is working well and pt is almost pain free but pt is very sleepy. Pt states things are going well.

## 2016-04-03 NOTE — Telephone Encounter (Signed)
Noted. Thanks.

## 2016-04-06 ENCOUNTER — Telehealth: Payer: Self-pay

## 2016-04-06 NOTE — Telephone Encounter (Signed)
Pt left v/m; pt feeling confused;memory is hopeless;can't fix her meds and difficulty functioning. Pt wants to know if could be due to her medicine. Pt request cb.

## 2016-04-06 NOTE — Telephone Encounter (Signed)
Yes likely due to oxycontin narcotic How is pain with addition of long acting oxycontin and lidoderm patches?  If increased confusion, we could try to decrease oxycontin to once daily (at night time) and continue tramadol 50mg  BID.  Does she have someone who can take over her medications (ie son)?

## 2016-04-06 NOTE — Telephone Encounter (Signed)
Patient notified as instructed by telephone and verbalized understanding. Patient stated that she has not picked the lidoderm patches up yet, but plans on getting them soon. Patient stated that she will try and see if taking one oxycodone helps with her confusion. Patient stated that she does not have anyone to help her with her medication because her son lives in [place] and is busy himself. Patient stated that she is using a weekly pill container to help her with her medications.

## 2016-04-07 ENCOUNTER — Telehealth: Payer: Self-pay

## 2016-04-07 NOTE — Telephone Encounter (Signed)
Jennifer at [facility] left v/m requesting verbal order to decrease home health aide to once a week per pts request. Anderson Malta request cb.

## 2016-04-08 NOTE — Telephone Encounter (Signed)
Agree. thanks

## 2016-04-08 NOTE — Telephone Encounter (Signed)
Anderson Malta with [facility] left v/m requesting cb about HH aid.

## 2016-04-09 ENCOUNTER — Telehealth: Payer: Self-pay

## 2016-04-09 ENCOUNTER — Ambulatory Visit (INDEPENDENT_AMBULATORY_CARE_PROVIDER_SITE_OTHER): Payer: Medicare Other | Admitting: *Deleted

## 2016-04-09 ENCOUNTER — Telehealth: Payer: Self-pay | Admitting: Cardiology

## 2016-04-09 DIAGNOSIS — E119 Type 2 diabetes mellitus without complications: Secondary | ICD-10-CM | POA: Diagnosis not present

## 2016-04-09 DIAGNOSIS — I4819 Other persistent atrial fibrillation: Secondary | ICD-10-CM

## 2016-04-09 DIAGNOSIS — M5441 Lumbago with sciatica, right side: Secondary | ICD-10-CM | POA: Diagnosis not present

## 2016-04-09 DIAGNOSIS — M4850XG Collapsed vertebra, not elsewhere classified, site unspecified, subsequent encounter for fracture with delayed healing: Secondary | ICD-10-CM | POA: Diagnosis not present

## 2016-04-09 DIAGNOSIS — I509 Heart failure, unspecified: Secondary | ICD-10-CM | POA: Diagnosis not present

## 2016-04-09 DIAGNOSIS — I110 Hypertensive heart disease with heart failure: Secondary | ICD-10-CM | POA: Diagnosis not present

## 2016-04-09 DIAGNOSIS — Z5181 Encounter for therapeutic drug level monitoring: Secondary | ICD-10-CM

## 2016-04-09 DIAGNOSIS — J984 Other disorders of lung: Secondary | ICD-10-CM | POA: Diagnosis not present

## 2016-04-09 DIAGNOSIS — I481 Persistent atrial fibrillation: Secondary | ICD-10-CM

## 2016-04-09 LAB — POCT INR: INR: 1.8

## 2016-04-09 NOTE — Telephone Encounter (Signed)
Jennifer notified, she verbalized understanding.

## 2016-04-09 NOTE — Telephone Encounter (Signed)
Patient

## 2016-04-09 NOTE — Telephone Encounter (Signed)
Lidocaine patches have been helpful.  Pt feels tramadol causing nausea. She has been taking 1/2 tablet BID. Asks if she can change to PRN. Ok to do.  Down to oxycontin 1 tablet daily.  Will update chart.

## 2016-04-09 NOTE — Telephone Encounter (Signed)
PLEASE NOTE: All timestamps contained within this report are represented as [place] Standard Time. CONFIDENTIALTY NOTICE: This fax transmission is intended only for the addressee. It contains information that is legally privileged, confidential or otherwise protected from use or disclosure. If you are not the intended recipient, you are strictly prohibited from reviewing, disclosing, copying using or disseminating any of this information or taking any action in reliance on or regarding this information. If you have received this fax in error, please notify us immediately by telephone so that we can arrange for its return to [place]. Phone: [phone], Toll-Free: [phone], Fax: [phone] Page: 1 of 1 Call Id: [id number] [place] Night - Client Nonclinical Telephone Record [place] Night - Client Client Site [place] Physician Ria Bush - MD Contact Type Call Who Is Calling Patient / Member / Family / Caregiver Caller Name [place] Phone Number [phone] Patient Name Vassie Moment Call Type Message Only Information Provided Reason for Call Request for General Office Information Initial Comment Caller said she wanted to leave a message for Dr Danise Mina. She is not taking the Tramadol he prescribed her. It has been making her very ill , waking up with pain and panic attacks. Additional Comment Call Closed By: Emeline Gins Transaction Date/Time: 04/08/2016 8:17:16 PM (ET)

## 2016-04-09 NOTE — Telephone Encounter (Signed)
Pt left v/m wanting to make sure Dr Darnell Level got pts message from last night; pt is not going to take tramadol any more due to getting very nauseated and seems to have more pain when taking tramadol. Pt having a lot of panic attacks. Pt is presently taking tylenol. Pt request cb.

## 2016-04-09 NOTE — Telephone Encounter (Signed)
Done.  See coumadin note. 

## 2016-04-09 NOTE — Telephone Encounter (Signed)
Patient contacted by this writer, confirms better with Lidocaine patches, Tylenol prn and once daily oxycontin regimen.  She will only use tramadol prn due to nausea.   She will call back if any other concerning symptoms but feels comfortable with current regimen.

## 2016-04-09 NOTE — Telephone Encounter (Signed)
Pt/inr    21.7    1.8

## 2016-04-15 ENCOUNTER — Telehealth: Payer: Self-pay

## 2016-04-15 DIAGNOSIS — M4850XG Collapsed vertebra, not elsewhere classified, site unspecified, subsequent encounter for fracture with delayed healing: Secondary | ICD-10-CM | POA: Diagnosis not present

## 2016-04-15 DIAGNOSIS — M5441 Lumbago with sciatica, right side: Secondary | ICD-10-CM | POA: Diagnosis not present

## 2016-04-15 DIAGNOSIS — E119 Type 2 diabetes mellitus without complications: Secondary | ICD-10-CM | POA: Diagnosis not present

## 2016-04-15 DIAGNOSIS — I509 Heart failure, unspecified: Secondary | ICD-10-CM | POA: Diagnosis not present

## 2016-04-15 DIAGNOSIS — J984 Other disorders of lung: Secondary | ICD-10-CM | POA: Diagnosis not present

## 2016-04-15 DIAGNOSIS — I110 Hypertensive heart disease with heart failure: Secondary | ICD-10-CM | POA: Diagnosis not present

## 2016-04-15 NOTE — Telephone Encounter (Signed)
Pt left v/m with update; pt continues with pain, anxiety and panic attacks. Pt has tried many different things and will wait and see how she feels tomorrow and will cb with update on 04/16/16. FYI to Dr Darnell Level.

## 2016-04-16 ENCOUNTER — Ambulatory Visit (HOSPITAL_COMMUNITY)
Admission: RE | Admit: 2016-04-16 | Discharge: 2016-04-16 | Disposition: A | Discharge status: Home / Self Care | Payer: Medicare Other | Source: Ambulatory Visit | Attending: Family Medicine | Admitting: Family Medicine

## 2016-04-16 DIAGNOSIS — M545 Low back pain: Secondary | ICD-10-CM | POA: Diagnosis not present

## 2016-04-16 DIAGNOSIS — IMO0001 Reserved for inherently not codable concepts without codable children: Secondary | ICD-10-CM

## 2016-04-16 DIAGNOSIS — M810 Age-related osteoporosis without current pathological fracture: Secondary | ICD-10-CM

## 2016-04-16 DIAGNOSIS — M4850XG Collapsed vertebra, not elsewhere classified, site unspecified, subsequent encounter for fracture with delayed healing: Principal | ICD-10-CM

## 2016-04-16 HISTORY — PX: IR GENERIC HISTORICAL: IMG1180011

## 2016-04-17 ENCOUNTER — Encounter (HOSPITAL_COMMUNITY): Payer: Self-pay | Admitting: Interventional Radiology

## 2016-04-17 NOTE — Telephone Encounter (Signed)
She should have had IR evaluation yesterday - how did that visit go?

## 2016-04-17 NOTE — Telephone Encounter (Signed)
She was pleased with visit, but said she will have to have another MRI to determine if he can help her. She said panic attacks are "invading her right now" and pain is manageable. She has follow up with you on 04/21/16.

## 2016-04-21 ENCOUNTER — Ambulatory Visit: Payer: Medicare Other | Admitting: Family Medicine

## 2016-04-22 DIAGNOSIS — E119 Type 2 diabetes mellitus without complications: Secondary | ICD-10-CM | POA: Diagnosis not present

## 2016-04-22 DIAGNOSIS — M4850XG Collapsed vertebra, not elsewhere classified, site unspecified, subsequent encounter for fracture with delayed healing: Secondary | ICD-10-CM | POA: Diagnosis not present

## 2016-04-22 DIAGNOSIS — M5441 Lumbago with sciatica, right side: Secondary | ICD-10-CM | POA: Diagnosis not present

## 2016-04-22 DIAGNOSIS — I509 Heart failure, unspecified: Secondary | ICD-10-CM | POA: Diagnosis not present

## 2016-04-22 DIAGNOSIS — I110 Hypertensive heart disease with heart failure: Secondary | ICD-10-CM | POA: Diagnosis not present

## 2016-04-22 DIAGNOSIS — J984 Other disorders of lung: Secondary | ICD-10-CM | POA: Diagnosis not present

## 2016-04-23 ENCOUNTER — Other Ambulatory Visit (HOSPITAL_COMMUNITY): Payer: Self-pay | Admitting: Interventional Radiology

## 2016-04-23 ENCOUNTER — Telehealth: Payer: Self-pay

## 2016-04-23 ENCOUNTER — Ambulatory Visit (HOSPITAL_COMMUNITY)
Admission: RE | Admit: 2016-04-23 | Discharge: 2016-04-23 | Disposition: A | Discharge status: Home / Self Care | Payer: Medicare Other | Source: Ambulatory Visit | Attending: Interventional Radiology | Admitting: Interventional Radiology

## 2016-04-23 DIAGNOSIS — M545 Low back pain, unspecified: Secondary | ICD-10-CM

## 2016-04-23 DIAGNOSIS — Z8781 Personal history of (healed) traumatic fracture: Secondary | ICD-10-CM

## 2016-04-23 DIAGNOSIS — M48061 Spinal stenosis, lumbar region without neurogenic claudication: Secondary | ICD-10-CM | POA: Diagnosis not present

## 2016-04-23 DIAGNOSIS — X58XXXA Exposure to other specified factors, initial encounter: Secondary | ICD-10-CM | POA: Insufficient documentation

## 2016-04-23 DIAGNOSIS — M4856XA Collapsed vertebra, not elsewhere classified, lumbar region, initial encounter for fracture: Secondary | ICD-10-CM | POA: Insufficient documentation

## 2016-04-23 NOTE — Telephone Encounter (Signed)
Pt left v/m; pt saw Dr Estanislado Pandy and he ordered an MRI of lumbar spine; pt has not been contacted about appt. Pt wants to know what can be done to get appt scheduled for MRI. Pt wants Dr [name] advice. I spoke with Anderson Malta Dr Estanislado Pandy' asst at [phone] and Anderson Malta just spoke with pt and pt has appt at [facility] today at 6 pm for MRI. I spoke with pt and she is OK now that she has appt for MRI scheduled. FYI to Dr Darnell Level.

## 2016-04-27 ENCOUNTER — Telehealth: Payer: Self-pay

## 2016-04-27 NOTE — Telephone Encounter (Signed)
Check INR one more time this week, phone in results to Malen Gauze, RN of [facility]. Thanks.

## 2016-04-27 NOTE — Telephone Encounter (Signed)
Anderson Malta called again. She is going to the pt's home at 1:30pm and would like a response by then.   She is going to discharge the pt b/c their services are complete and wants to make sure the pt is aware she is released to go back to clinic for inr checks   [phone]

## 2016-04-27 NOTE — Telephone Encounter (Signed)
Anderson Malta from [facility] would like to know if they need to continue to check pt's PT/INR if so new orders are needed.

## 2016-04-27 NOTE — Telephone Encounter (Signed)
PLEASE NOTE: All timestamps contained within this report are represented as [place] Standard Time. CONFIDENTIALTY NOTICE: This fax transmission is intended only for the addressee. It contains information that is legally privileged, confidential or otherwise protected from use or disclosure. If you are not the intended recipient, you are strictly prohibited from reviewing, disclosing, copying using or disseminating any of this information or taking any action in reliance on or regarding this information. If you have received this fax in error, please notify us immediately by telephone so that we can arrange for its return to [place]. Phone: [phone], Toll-Free: [phone], Fax: [phone] Page: 1 of 1 Call Id: [id number] [place] Night - Client Nonclinical Telephone Record [place] Night - Client Client Site [place] Physician Ria Bush - MD Contact Type Call Who Is Calling Patient / Member / Family / Caregiver Caller Name Nell Range Phone Number [phone] Patient Name [name] Call Type Message Only Information Provided Reason for Call Request for General Office Information Initial Comment Anderson Malta with [facility] Her orders run out Wednesday and she needs the new orders by tomorrow. PT is finished with her. Caller doesn't need a call back tonight but first thing in the morning. Call Closed By: Claudette Laws Transaction Date/Time: 04/26/2016 6:55:57 PM (ET)

## 2016-04-28 ENCOUNTER — Ambulatory Visit (INDEPENDENT_AMBULATORY_CARE_PROVIDER_SITE_OTHER): Payer: Medicare Other | Admitting: *Deleted

## 2016-04-28 ENCOUNTER — Other Ambulatory Visit: Payer: Self-pay | Admitting: Cardiology

## 2016-04-28 DIAGNOSIS — M5441 Lumbago with sciatica, right side: Secondary | ICD-10-CM | POA: Diagnosis not present

## 2016-04-28 DIAGNOSIS — I481 Persistent atrial fibrillation: Secondary | ICD-10-CM

## 2016-04-28 DIAGNOSIS — E119 Type 2 diabetes mellitus without complications: Secondary | ICD-10-CM | POA: Diagnosis not present

## 2016-04-28 DIAGNOSIS — M4850XG Collapsed vertebra, not elsewhere classified, site unspecified, subsequent encounter for fracture with delayed healing: Secondary | ICD-10-CM | POA: Diagnosis not present

## 2016-04-28 DIAGNOSIS — I4819 Other persistent atrial fibrillation: Secondary | ICD-10-CM

## 2016-04-28 DIAGNOSIS — J984 Other disorders of lung: Secondary | ICD-10-CM | POA: Diagnosis not present

## 2016-04-28 DIAGNOSIS — I509 Heart failure, unspecified: Secondary | ICD-10-CM | POA: Diagnosis not present

## 2016-04-28 DIAGNOSIS — Z5181 Encounter for therapeutic drug level monitoring: Secondary | ICD-10-CM

## 2016-04-28 DIAGNOSIS — I110 Hypertensive heart disease with heart failure: Secondary | ICD-10-CM | POA: Diagnosis not present

## 2016-04-28 LAB — POCT INR: INR: 1.5

## 2016-04-28 NOTE — Telephone Encounter (Signed)
Jennifer notified. Given verbal order for 1 week/1 visit to recheck INR and she will let Edrick Oh know results and get her scheduled with clinic.

## 2016-05-01 ENCOUNTER — Ambulatory Visit (INDEPENDENT_AMBULATORY_CARE_PROVIDER_SITE_OTHER): Payer: Medicare Other | Admitting: Family Medicine

## 2016-05-01 ENCOUNTER — Encounter: Payer: Self-pay | Admitting: Family Medicine

## 2016-05-01 VITALS — BP 126/82 | HR 92 | Temp 97.7°F | Wt 109.0 lb

## 2016-05-01 DIAGNOSIS — K5900 Constipation, unspecified: Secondary | ICD-10-CM | POA: Diagnosis not present

## 2016-05-01 DIAGNOSIS — R21 Rash and other nonspecific skin eruption: Secondary | ICD-10-CM

## 2016-05-01 DIAGNOSIS — M4850XG Collapsed vertebra, not elsewhere classified, site unspecified, subsequent encounter for fracture with delayed healing: Secondary | ICD-10-CM | POA: Diagnosis not present

## 2016-05-01 DIAGNOSIS — M8000XD Age-related osteoporosis with current pathological fracture, unspecified site, subsequent encounter for fracture with routine healing: Secondary | ICD-10-CM | POA: Diagnosis not present

## 2016-05-01 DIAGNOSIS — IMO0001 Reserved for inherently not codable concepts without codable children: Secondary | ICD-10-CM

## 2016-05-01 MED ORDER — OXYCODONE HCL ER 10 MG PO T12A: 10.0000 mg | EXTENDED_RELEASE_TABLET | Freq: Every day | ORAL | 0 refills | Status: DC

## 2016-05-01 MED ORDER — LIDOCAINE 5 % EX PTCH: 1.0000 | MEDICATED_PATCH | CUTANEOUS | 6 refills | Status: AC

## 2016-05-01 NOTE — Progress Notes (Signed)
Pre visit review using our clinic review tool, if applicable. No additional management support is needed unless otherwise documented below in the visit note. 

## 2016-05-01 NOTE — Progress Notes (Signed)
BP 126/82   Pulse 92   Temp 97.7 F (36.5 C) (Oral)   Wt 109 lb (49.4 kg)   BMI 18.71 kg/m    CC: f/u visit Subjective:    Patient ID: [name], female    DOB: [birth year], 82 y.o.   MRN: [id number]  HPI: [name] is a 82 y.o. female presenting on 05/01/2016 for Follow-up   Recently seen by IR Dr Estanislado Pandy - rpt MRI showed new progression of L2 fracture along with known L3 compression fracture, along with severe foraminal narrowing and spinal stenosis.   Ongoing marked disability from pain due to progressive L3 compression fracture. S/p Dr Christella Noa eval, rec against vertebral procedure due to coumadin risk. Referred to IR for second opinion of vertebral augmentation   Pain regimen: oxycontin 10mg  nightly, tylenol 1000mg  TID, and tramadol 25mg  rarely PRN, lidocaine patches which are helpful.  Bowel regimen: miralax 1 capful daily, senna/docusate 1 tablet daily.   New L lower leg rash present over last 3 days. Slowly improving.   Relevant past medical, surgical, family and social history reviewed and updated as indicated. Interim medical history since our last visit reviewed. Allergies and medications reviewed and updated. Current Outpatient Prescriptions on File Prior to Visit  Medication Sig  . acetaminophen (TYLENOL) 500 MG tablet Take 2 tablets (1,000 mg total) by mouth 3 (three) times daily.  . calcitonin, salmon, (MIACALCIN/FORTICAL) 200 UNIT/ACT nasal spray Place 1 spray into alternate nostrils daily.  . Calcium-Magnesium-Vitamin D [id number] MG-MG-UNIT TABS Take 1 tablet by mouth 2 (two) times daily.  . clonazePAM (KLONOPIN) 0.5 MG tablet Take 1 tablet (0.5 mg total) by mouth at bedtime as needed for anxiety.  . felodipine (PLENDIL) 2.5 MG 24 hr tablet Take 1 tablet (2.5 mg total) by mouth daily.  . ferrous sulfate 325 (65 FE) MG tablet Take 325 mg by mouth daily with breakfast.  . furosemide (LASIX) 20 MG tablet Take 40 mg by mouth daily as needed for  fluid.   Marland Kitchen latanoprost (XALATAN) 0.005 % ophthalmic solution Place 1 drop into both eyes at bedtime.   Marland Kitchen levothyroxine (SYNTHROID, LEVOTHROID) 50 MCG tablet TAKE 1 TABLET DAILY  . Multiple Vitamin (MULTIVITAMIN) capsule Take 1 capsule by mouth every morning.   . Multiple Vitamins-Minerals (ICAPS AREDS FORMULA PO) Take 1 tablet by mouth daily.   . Omega-3 Fatty Acids (FISH OIL CONCENTRATE) 1000 MG CAPS Take 2 capsules by mouth 2 (two) times daily.    . polyethylene glycol (MIRALAX / GLYCOLAX) packet Take 17 g by mouth daily.  . potassium chloride SA (K-DUR,KLOR-CON) 20 MEQ tablet Take 1 tablet (20 mEq total) by mouth daily as needed. (Patient taking differently: Take 20 mEq by mouth daily as needed (along with Lasix). )  . sennosides-docusate sodium (SENOKOT-S) 8.6-50 MG tablet Take 1 tablet by mouth daily.  . sertraline (ZOLOFT) 50 MG tablet Take 1 tablet (50 mg total) by mouth daily.  . traMADol (ULTRAM) 50 MG tablet Take 0.5-1 tablets (25-50 mg total) by mouth every 8 (eight) hours as needed for moderate pain.  . vitamin B-12 (CYANOCOBALAMIN) 500 MCG tablet Take 500 mcg by mouth daily.  Marland Kitchen warfarin (COUMADIN) 2.5 MG tablet Take 1 tablet daily except 1 1/2 tablets on Mondays and Thursdays  . [DISCONTINUED] Calcium Carbonate-Vitamin D (CALCIUM PLUS VITAMIN D PO) Take 2 tablets by mouth daily.     No current facility-administered medications on file prior to visit.     Review of Systems Per HPI  unless specifically indicated in ROS section     Objective:    BP 126/82   Pulse 92   Temp 97.7 F (36.5 C) (Oral)   Wt 109 lb (49.4 kg)   BMI 18.71 kg/m   Wt Readings from Last 3 Encounters:  05/01/16 109 lb (49.4 kg)  03/31/16 115 lb 8 oz (52.4 kg)  03/10/16 119 lb (54 kg)    Physical Exam  Constitutional: She appears well-developed and well-nourished. No distress.  Musculoskeletal: She exhibits edema (tr).  Skin: Skin is warm and dry. Rash noted. There is erythema.  nonblanching  papular rash along left lower leg with some coalescing areas, not pruritic, mildly tender  Nursing note and vitals reviewed.  Results for orders placed or performed in visit on 04/28/16  POCT INR  Result Value Ref Range   INR 1.5    MRI LUMBAR SPINE WITHOUT CONTRAST IMPRESSION: 1. Progression of L2 compression fracture with associated marrow edema, suggesting the fracture is acute to subacute. 2. No progression of L3 compression fracture, T12 superior endplate fracture or L4 superior endplate fracture. 3. Moderate spinal canal stenosis at L1-L2, primarily due to retropulsion of the posterior superior L2 endplate. 4. Severe neural foraminal stenosis at left L1-2 and moderate foraminal stenosis at multiple other levels. Electronically Signed   By: Ulyses Jarred M.D.   On: 04/23/2016 21:52    Assessment & Plan:   Problem List Items Addressed This Visit    Compression fracture of vertebrae (HCC)    MRI showing progression of L2 fracture along with old L3 fracture, spinal stenosis and severe neural foraminal stenosis. Reviewed pain regimen - see pt instructions. rec call Dr Estanislado Pandy on Monday to f/u on plan.      Constipation    Stable on current regimen.      Osteoporosis    Continue calcitonin, consider restart boniva next month.      Skin rash    New localized to RLE. ?drug rash to lidocaine or oxycontin, ?leukocytoclastic vasculitis. Pt declines change in med management at this time as she feels stable on current regimen. Endorses rash is improving. Will let me know if deterioration to consider change in regimen (likely d/c lidocaine patches).       Other Visit Diagnoses   None.      Follow up plan: No Follow-up on file.  Ria Bush, MD

## 2016-05-01 NOTE — Assessment & Plan Note (Signed)
New localized to RLE. ?drug rash to lidocaine or oxycontin, ?leukocytoclastic vasculitis. Pt declines change in med management at this time as she feels stable on current regimen. Endorses rash is improving. Will let me know if deterioration to consider change in regimen (likely d/c lidocaine patches).

## 2016-05-01 NOTE — Assessment & Plan Note (Signed)
Continue calcitonin, consider restart boniva next month.

## 2016-05-01 NOTE — Assessment & Plan Note (Signed)
MRI showing progression of L2 fracture along with old L3 fracture, spinal stenosis and severe neural foraminal stenosis. Reviewed pain regimen - see pt instructions. rec call Dr Estanislado Pandy on Monday to f/u on plan.

## 2016-05-01 NOTE — Assessment & Plan Note (Addendum)
Stable on current regimen   

## 2016-05-01 NOTE — Patient Instructions (Addendum)
Call Dr Estanislado Pandy on Monday to follow up on plan for possible intervention for back pain Pain regimen:  1. oxycontin 10mg  nightly 2. tylenol 1000mg  TID 3. tramadol 25mg  PRN 4. lidocaine patches daily for 12 hours on, 12 hours off

## 2016-05-06 ENCOUNTER — Ambulatory Visit (INDEPENDENT_AMBULATORY_CARE_PROVIDER_SITE_OTHER): Payer: Medicare Other | Admitting: *Deleted

## 2016-05-06 DIAGNOSIS — G458 Other transient cerebral ischemic attacks and related syndromes: Secondary | ICD-10-CM

## 2016-05-06 DIAGNOSIS — I481 Persistent atrial fibrillation: Secondary | ICD-10-CM

## 2016-05-06 DIAGNOSIS — Z5181 Encounter for therapeutic drug level monitoring: Secondary | ICD-10-CM

## 2016-05-06 DIAGNOSIS — I4819 Other persistent atrial fibrillation: Secondary | ICD-10-CM

## 2016-05-06 LAB — POCT INR: INR: 1.7

## 2016-05-07 ENCOUNTER — Telehealth: Payer: Self-pay | Admitting: Family Medicine

## 2016-05-07 NOTE — Telephone Encounter (Signed)
Pt called very frustrated, she states she called Dr. Arlean Hopping office on 05/04/16 as instructed and has not heard from their office.  I called Deveshwar's office, spoke to Almyra Free, she will have Anderson Malta follow up with Patient.  Best number to call is [phone].  I spoke with pt, she is waiting for their call re: MRI results and plan moving forward.  Pt will call us back as needed.

## 2016-05-08 ENCOUNTER — Telehealth: Payer: Self-pay

## 2016-05-08 ENCOUNTER — Telehealth: Payer: Self-pay | Admitting: Cardiology

## 2016-05-08 NOTE — Telephone Encounter (Signed)
error 

## 2016-05-08 NOTE — Telephone Encounter (Signed)
Pt left v/m; rash on lt lower leg is dissipating but not completely gone and pt wanted Dr Darnell Level to know.

## 2016-05-12 NOTE — Telephone Encounter (Signed)
Noted  

## 2016-05-14 ENCOUNTER — Telehealth: Payer: Self-pay | Admitting: Cardiology

## 2016-05-14 NOTE — Telephone Encounter (Signed)
Patient states that Dr.Deveshwar is requesting that Dr.McDowell send clearance for surgery. / tg

## 2016-05-14 NOTE — Telephone Encounter (Signed)
MD office at lunch,will call back and determine what surgery/procedure pt is having with Dr Estanislado Pandy

## 2016-05-14 NOTE — Telephone Encounter (Signed)
Faxed to [facility] in [place] of Dr Domenic Polite, advise pt when to HOLD coumadin

## 2016-05-14 NOTE — Telephone Encounter (Signed)
Pending cement injection in back.  Not scheduled yet. Pt will need to hold coumadin 5 days before procedure and be bridged with Lovenox.  CHADsScore=6 Clearance form faxed back to Dr Arlean Hopping office.  They will need to let me know 1 -2 wks before procedure is scheduled so I can see pt in the office to instruct on Lovenox bridge.

## 2016-05-14 NOTE — Telephone Encounter (Signed)
Dr Juliet Rude [facility] [phone]   Clearance placed on MD desk,will have bone cement injected into vertebrae and pt is on coumadin

## 2016-05-18 ENCOUNTER — Ambulatory Visit (INDEPENDENT_AMBULATORY_CARE_PROVIDER_SITE_OTHER): Payer: Medicare Other | Admitting: *Deleted

## 2016-05-18 DIAGNOSIS — G458 Other transient cerebral ischemic attacks and related syndromes: Secondary | ICD-10-CM

## 2016-05-18 DIAGNOSIS — I4819 Other persistent atrial fibrillation: Secondary | ICD-10-CM

## 2016-05-18 DIAGNOSIS — I481 Persistent atrial fibrillation: Secondary | ICD-10-CM | POA: Diagnosis not present

## 2016-05-18 DIAGNOSIS — Z5181 Encounter for therapeutic drug level monitoring: Secondary | ICD-10-CM | POA: Diagnosis not present

## 2016-05-18 LAB — POCT INR: INR: 3.6

## 2016-05-20 DIAGNOSIS — M4850XA Collapsed vertebra, not elsewhere classified, site unspecified, initial encounter for fracture: Secondary | ICD-10-CM

## 2016-05-20 HISTORY — DX: Collapsed vertebra, not elsewhere classified, site unspecified, initial encounter for fracture: M48.50XA

## 2016-05-23 ENCOUNTER — Other Ambulatory Visit: Payer: Self-pay | Admitting: Family Medicine

## 2016-06-01 ENCOUNTER — Encounter: Payer: Self-pay | Admitting: Family Medicine

## 2016-06-01 ENCOUNTER — Telehealth: Payer: Self-pay | Admitting: Family Medicine

## 2016-06-01 ENCOUNTER — Ambulatory Visit (INDEPENDENT_AMBULATORY_CARE_PROVIDER_SITE_OTHER)
Admission: RE | Admit: 2016-06-01 | Discharge: 2016-06-01 | Disposition: A | Discharge status: Home / Self Care | Payer: Medicare Other | Source: Ambulatory Visit | Attending: Family Medicine | Admitting: Family Medicine

## 2016-06-01 ENCOUNTER — Ambulatory Visit (INDEPENDENT_AMBULATORY_CARE_PROVIDER_SITE_OTHER): Payer: Medicare Other | Admitting: *Deleted

## 2016-06-01 ENCOUNTER — Ambulatory Visit (INDEPENDENT_AMBULATORY_CARE_PROVIDER_SITE_OTHER): Payer: Medicare Other | Admitting: Family Medicine

## 2016-06-01 VITALS — BP 140/90 | HR 106 | Wt 109.0 lb

## 2016-06-01 DIAGNOSIS — I4819 Other persistent atrial fibrillation: Secondary | ICD-10-CM

## 2016-06-01 DIAGNOSIS — IMO0001 Reserved for inherently not codable concepts without codable children: Secondary | ICD-10-CM

## 2016-06-01 DIAGNOSIS — I481 Persistent atrial fibrillation: Secondary | ICD-10-CM | POA: Diagnosis not present

## 2016-06-01 DIAGNOSIS — M4850XG Collapsed vertebra, not elsewhere classified, site unspecified, subsequent encounter for fracture with delayed healing: Secondary | ICD-10-CM

## 2016-06-01 DIAGNOSIS — F418 Other specified anxiety disorders: Secondary | ICD-10-CM

## 2016-06-01 DIAGNOSIS — Z5181 Encounter for therapeutic drug level monitoring: Secondary | ICD-10-CM

## 2016-06-01 DIAGNOSIS — M8000XD Age-related osteoporosis with current pathological fracture, unspecified site, subsequent encounter for fracture with routine healing: Secondary | ICD-10-CM

## 2016-06-01 DIAGNOSIS — S32020D Wedge compression fracture of second lumbar vertebra, subsequent encounter for fracture with routine healing: Secondary | ICD-10-CM | POA: Diagnosis not present

## 2016-06-01 DIAGNOSIS — G458 Other transient cerebral ischemic attacks and related syndromes: Secondary | ICD-10-CM

## 2016-06-01 DIAGNOSIS — E559 Vitamin D deficiency, unspecified: Secondary | ICD-10-CM

## 2016-06-01 DIAGNOSIS — E039 Hypothyroidism, unspecified: Secondary | ICD-10-CM

## 2016-06-01 LAB — MAGNESIUM: Magnesium: 2 mg/dL (ref 1.5–2.5)

## 2016-06-01 LAB — BASIC METABOLIC PANEL
BUN: 15 mg/dL (ref 6–23)
CALCIUM: 9.8 mg/dL (ref 8.4–10.5)
CO2: 28 mEq/L (ref 19–32)
CREATININE: 0.59 mg/dL (ref 0.40–1.20)
Chloride: 104 mEq/L (ref 96–112)
GFR: 102.07 mL/min (ref >60.00)
GLUCOSE: 100 mg/dL — AB (ref 70–99)
Potassium: 3.6 mEq/L (ref 3.5–5.1)
Sodium: 142 mEq/L (ref 135–145)

## 2016-06-01 LAB — TSH: TSH: 2.37 u[IU]/mL (ref 0.35–4.50)

## 2016-06-01 LAB — POCT INR: INR: 1.5

## 2016-06-01 LAB — VITAMIN D 25 HYDROXY (VIT D DEFICIENCY, FRACTURES): VITD: 89.41 ng/mL (ref 30.00–100.00)

## 2016-06-01 MED ORDER — OXYCODONE HCL ER 10 MG PO T12A: 10.0000 mg | EXTENDED_RELEASE_TABLET | Freq: Every day | ORAL | 0 refills | Status: DC

## 2016-06-01 MED ORDER — LEVOTHYROXINE SODIUM 50 MCG PO TABS: 50.0000 ug | ORAL_TABLET | Freq: Every day | ORAL | 3 refills | Status: DC

## 2016-06-01 MED ORDER — TRAMADOL HCL 50 MG PO TABS: 25.0000 mg | ORAL_TABLET | Freq: Three times a day (TID) | ORAL | 0 refills | Status: DC | PRN

## 2016-06-01 MED ORDER — CLONAZEPAM 0.5 MG PO TABS: 0.5000 mg | ORAL_TABLET | Freq: Every evening | ORAL | 0 refills | Status: DC | PRN

## 2016-06-01 NOTE — Telephone Encounter (Signed)
My CMA will work on this. Thank you.

## 2016-06-01 NOTE — Progress Notes (Signed)
Pre visit review using our clinic review tool, if applicable. No additional management support is needed unless otherwise documented below in the visit note. 

## 2016-06-01 NOTE — Patient Instructions (Addendum)
See Rosaria Ferries on your way out to [place] with Dr Arlean Hopping office.  We will set you up for another boniva treatment.  Continue current medicines. Labs today.  Return in 1-2 months for follow up visit.

## 2016-06-01 NOTE — Telephone Encounter (Signed)
While pt checking out, she questioned whether or not she needed to restart her Boniva?  Please advise  [name] is working on appt with Dr. Estanislado Pandy

## 2016-06-01 NOTE — Progress Notes (Signed)
BP 140/90   Pulse (!) 106   Wt 109 lb (49.4 kg)   SpO2 96%   BMI 18.71 kg/m    CC: 1 mo f/u visit Subjective:    Patient ID: [name], female    DOB: [birth year], 82 y.o.   MRN: [id number]  HPI: [name] is a 82 y.o. female presenting on 06/01/2016 for Follow-up and Back Pain (joint pain )   See prior note for details. Recently seen by IR Dr Estanislado Pandy - rpt MRI showed new progression of L2 fracture along with known L3 compression fracture, along with severe foraminal narrowing and spinal stenosis.   Ongoing marked disability from pain due to progressive L3 compression fracture.  Current pain regimen:  1. oxycontin 10mg  nightly 2. tylenol 1000mg  TID 3. tramadol 25mg  PRN 4. lidocaine patches daily for 12 hours on, 12 hours off   2 days ago noticed new sharp pain above prior sites of compression. This feels like new compression fracture. Denies inciting trauma/injury.   LLE rash - presumed drug rash to lidocaine or oxycontin. This has stabilized.   Relevant past medical, surgical, family and social history reviewed and updated as indicated. Interim medical history since our last visit reviewed. Allergies and medications reviewed and updated. Current Outpatient Prescriptions on File Prior to Visit  Medication Sig  . acetaminophen (TYLENOL) 500 MG tablet Take 2 tablets (1,000 mg total) by mouth 3 (three) times daily.  . calcitonin, salmon, (MIACALCIN/FORTICAL) 200 UNIT/ACT nasal spray Place 1 spray into alternate nostrils daily.  . Calcium-Magnesium-Vitamin D [id number] MG-MG-UNIT TABS Take 1 tablet by mouth 2 (two) times daily.  . felodipine (PLENDIL) 2.5 MG 24 hr tablet TAKE 1 TABLET DAILY  . ferrous sulfate 325 (65 FE) MG tablet Take 325 mg by mouth daily with breakfast.  . furosemide (LASIX) 20 MG tablet Take 40 mg by mouth daily as needed for fluid.   Marland Kitchen latanoprost (XALATAN) 0.005 % ophthalmic solution Place 1 drop into both eyes at bedtime.   . lidocaine  (LIDODERM) 5 % Place 1 patch onto the skin daily. Remove & Discard patch within 12 hours or as directed by MD  . Multiple Vitamin (MULTIVITAMIN) capsule Take 1 capsule by mouth every morning.   . Multiple Vitamins-Minerals (ICAPS AREDS FORMULA PO) Take 1 tablet by mouth daily.   . Omega-3 Fatty Acids (FISH OIL CONCENTRATE) 1000 MG CAPS Take 2 capsules by mouth 2 (two) times daily.    . polyethylene glycol (MIRALAX / GLYCOLAX) packet Take 17 g by mouth daily.  . potassium chloride SA (K-DUR,KLOR-CON) 20 MEQ tablet Take 1 tablet (20 mEq total) by mouth daily as needed. (Patient taking differently: Take 20 mEq by mouth daily as needed (along with Lasix). )  . sennosides-docusate sodium (SENOKOT-S) 8.6-50 MG tablet Take 1 tablet by mouth daily.  . sertraline (ZOLOFT) 50 MG tablet Take 1 tablet (50 mg total) by mouth daily.  . vitamin B-12 (CYANOCOBALAMIN) 500 MCG tablet Take 500 mcg by mouth daily.  Marland Kitchen warfarin (COUMADIN) 2.5 MG tablet Take 1 tablet daily except 1 1/2 tablets on Mondays and Thursdays  . [DISCONTINUED] Calcium Carbonate-Vitamin D (CALCIUM PLUS VITAMIN D PO) Take 2 tablets by mouth daily.     No current facility-administered medications on file prior to visit.     Review of Systems Per HPI unless specifically indicated in ROS section     Objective:    BP 140/90   Pulse (!) 106   Wt 109 lb (49.4  kg)   SpO2 96%   BMI 18.71 kg/m   Wt Readings from Last 3 Encounters:  06/01/16 109 lb (49.4 kg)  05/01/16 109 lb (49.4 kg)  03/31/16 115 lb 8 oz (52.4 kg)    Physical Exam  Constitutional: She appears well-developed and well-nourished. No distress.  HENT:  Mouth/Throat: Oropharynx is clear and moist. No oropharyngeal exudate.  Cardiovascular: Normal rate, regular rhythm, normal heart sounds and intact distal pulses.   No murmur heard. Pulmonary/Chest: Effort normal and breath sounds normal. No respiratory distress. She has no wheezes. She has no rales.  Musculoskeletal: She  exhibits no edema.  Tender to palpation midline lumbar spine L1 through L3  Skin: Skin is warm and dry. No rash noted.  Psychiatric: She has a normal mood and affect.  Nursing note and vitals reviewed.  Results for orders placed or performed in visit on [id number]  Basic metabolic panel  Result Value Ref Range   Sodium 142 135 - 145 mEq/L   Potassium 3.6 3.5 - 5.1 mEq/L   Chloride 104 96 - 112 mEq/L   CO2 28 19 - 32 mEq/L   Glucose, Bld 100 (H) 70 - 99 mg/dL   BUN 15 6 - 23 mg/dL   Creatinine, Ser 0.59 0.40 - 1.20 mg/dL   Calcium 9.8 8.4 - 10.5 mg/dL   GFR 102.07 >60.00 mL/min  TSH  Result Value Ref Range   TSH 2.37 0.35 - 4.50 uIU/mL  VITAMIN D 25 Hydroxy (Vit-D Deficiency, Fractures)  Result Value Ref Range   VITD 89.41 30.00 - 100.00 ng/mL  Magnesium  Result Value Ref Range   Magnesium 2.0 1.5 - 2.5 mg/dL   MRI LUMBAR SPINE WITHOUT CONTRAST IMPRESSION: 1. Progression of L2 compression fracture with associated marrow edema, suggesting the fracture is acute to subacute. 2. No progression of L3 compression fracture, T12 superior endplate fracture or L4 superior endplate fracture. 3. Moderate spinal canal stenosis at L1-L2, primarily due to retropulsion of the posterior superior L2 endplate. 4. Severe neural foraminal stenosis at left L1-2 and moderate foraminal stenosis at multiple other levels. Electronically Signed   By: Ulyses Jarred M.D.   On: 04/23/2016 21:52    Assessment & Plan:   Problem List Items Addressed This Visit    Compression fracture of vertebrae ([place])    Still awaiting IR scheduling of vertebral augmentation procedure although pt states she received coumadin clearance almost 1 month ago. I will ask our referral coordinator to contact Dr Arlean Hopping office to inquire about scheduling. Recurrent compression fractures in known osteoporosis. Update labs today      Relevant Orders   Basic metabolic panel (Completed)   Parathyroid hormone, intact (no Ca)     VITAMIN D 25 Hydroxy (Vit-D Deficiency, Fractures) (Completed)   Magnesium (Completed)   DG Lumbar Spine Complete (Completed)   Depression with anxiety    Situational - stemming from impairment in ADLs/IADLs due to chronic pain from compression fractures. Continue sertraline 50mg .       Encounter for therapeutic drug monitoring    Pain Management Overview: Indication for chronic opioid: compression fractures Medication and dose: oxycontin 10mg  QHS, tramadol 25-50mg  PRN # pills per month: oxycontin 30, tramadol 30 Last UDS date: 06/2015 Pain contract signed (Y/N): Y Date narcotic database last reviewed (include red flags): 05/2016       Hypothyroidism   Relevant Medications   levothyroxine (SYNTHROID, LEVOTHROID) 50 MCG tablet   Other Relevant Orders   TSH (Completed)   Osteoporosis -  Primary    Continues calcitonin nasal spray.  We have been holding boniva since early 2017 because of planned dental work that was never completed. With progressive compression fractures, will restart boniva and schedule infusion ASAP. Pt agrees with plan. Check vit D, Ca levels today.       Relevant Orders   Basic metabolic panel (Completed)   TSH (Completed)   Parathyroid hormone, intact (no Ca)   VITAMIN D 25 Hydroxy (Vit-D Deficiency, Fractures) (Completed)   Magnesium (Completed)   DG Lumbar Spine Complete (Completed)   Persistent atrial fibrillation ([place])    Sounds regular today. Continue coumadin.        Other Visit Diagnoses    Vitamin D deficiency       Relevant Orders   VITAMIN D 25 Hydroxy (Vit-D Deficiency, Fractures) (Completed)       Follow up plan: Return in about 2 months (around 08/01/2016), or if symptoms worsen or fail to improve, for follow up visit.  Ria Bush, MD

## 2016-06-02 ENCOUNTER — Other Ambulatory Visit (HOSPITAL_COMMUNITY): Payer: Self-pay | Admitting: Interventional Radiology

## 2016-06-02 DIAGNOSIS — M4850XS Collapsed vertebra, not elsewhere classified, site unspecified, sequela of fracture: Principal | ICD-10-CM

## 2016-06-02 DIAGNOSIS — IMO0001 Reserved for inherently not codable concepts without codable children: Secondary | ICD-10-CM

## 2016-06-02 LAB — PARATHYROID HORMONE, INTACT (NO CA): PTH: 28 pg/mL (ref 14–64)

## 2016-06-02 NOTE — Assessment & Plan Note (Signed)
Sounds regular today.  Continue coumadin.  

## 2016-06-02 NOTE — Assessment & Plan Note (Signed)
Situational - stemming from impairment in ADLs/IADLs due to chronic pain from compression fractures. Continue sertraline 50mg .

## 2016-06-02 NOTE — Telephone Encounter (Signed)
Order in your IN box for review/completion.

## 2016-06-02 NOTE — Assessment & Plan Note (Signed)
Still awaiting IR scheduling of vertebral augmentation procedure although pt states she received coumadin clearance almost 1 month ago. I will ask our referral coordinator to contact Dr Arlean Hopping office to inquire about scheduling. Recurrent compression fractures in known osteoporosis. Update labs today

## 2016-06-02 NOTE — Assessment & Plan Note (Signed)
Pain Management Overview: Indication for chronic opioid: compression fractures Medication and dose: oxycontin 10mg  QHS, tramadol 25-50mg  PRN # pills per month: oxycontin 30, tramadol 30 Last UDS date: 06/2015 Pain contract signed (Y/N): Y Date narcotic database last reviewed (include red flags): 05/2016

## 2016-06-02 NOTE — Assessment & Plan Note (Signed)
Continues calcitonin nasal spray.  We have been holding boniva since early 2017 because of planned dental work that was never completed. With progressive compression fractures, will restart boniva and schedule infusion ASAP. Pt agrees with plan. Check vit D, Ca levels today.

## 2016-06-03 ENCOUNTER — Telehealth: Payer: Self-pay | Admitting: Family Medicine

## 2016-06-03 NOTE — Telephone Encounter (Signed)
Pt called. States she is developing new pain, (ust below waste level to hips)  Pt wants to know if she can take more pain medication? Or a different/stronger medication?  Pt seen 06/01/16. Please advise   [phone]

## 2016-06-03 NOTE — Telephone Encounter (Signed)
How is she taking tramadol? I believe she's taking 25mg  once daily prn - would suggest increase to 25mg  bid to tid prn.

## 2016-06-03 NOTE — Telephone Encounter (Signed)
Spoke with patient. She is currently taking 25mg  TID. I advised someone would be back in touch with her. (I leave at 2:00)

## 2016-06-05 NOTE — Telephone Encounter (Signed)
We could try taking long acting oxycontin twice daily - would suggest she try this and let us know how it's tolerated over the weekend.

## 2016-06-05 NOTE — Telephone Encounter (Signed)
Message left for patient to return my call.  

## 2016-06-05 NOTE — Telephone Encounter (Signed)
Patient notified and will call next week with an update.

## 2016-06-07 NOTE — Telephone Encounter (Signed)
Signed and in Kim's box. 

## 2016-06-08 ENCOUNTER — Encounter: Payer: Self-pay | Admitting: *Deleted

## 2016-06-09 ENCOUNTER — Other Ambulatory Visit: Payer: Self-pay | Admitting: Family Medicine

## 2016-06-09 NOTE — Telephone Encounter (Signed)
Pt left v/m that the pain medication is not helping her pain. Pt wants to know if anything else can be done. [facility] [place]. Pt request cb.

## 2016-06-09 NOTE — Telephone Encounter (Signed)
Order faxed to short stay.

## 2016-06-09 NOTE — Telephone Encounter (Signed)
UNABLE TO LEAVE MESSAGE. Pt to call [phone] to schedule Boniva infusion. Order has been faxed.

## 2016-06-10 ENCOUNTER — Telehealth: Payer: Self-pay | Admitting: *Deleted

## 2016-06-10 MED ORDER — ENOXAPARIN SODIUM 80 MG/0.8ML ~~LOC~~ SOLN: 80.0000 mg | SUBCUTANEOUS | 1 refills | Status: DC

## 2016-06-10 NOTE — Telephone Encounter (Signed)
Patient notified and will call to schedule appt.

## 2016-06-10 NOTE — Telephone Encounter (Signed)
Spoke to pt. She said she is having surgery on the 28th. She does not want to make any changes right now.

## 2016-06-10 NOTE — Telephone Encounter (Signed)
Pt is very nervous and confused about administering Lovenox injections herself.  Spoke with Duwayne Heck RN [facility] and referral sent to [facility] for them to do lovenox teaching with pt and spouse.  Pt information and orders faxed to [facility] [phone].

## 2016-06-10 NOTE — Telephone Encounter (Signed)
Pt is having a procedure on her back on 06/16/16 --she wants to make sure that today is the last day to stop her coumadin and wants to be sure she doesn't need to take anything else. Cordelia Pen called from the Dr's office who's doing the procedure and wants to make sure please give the office a call @ [phone] and please call Ms. Kinnie Scales

## 2016-06-10 NOTE — Telephone Encounter (Signed)
Already taking oxycontin 10mg  BID and tramadol 25mg  TID. Next step to consider would be try vicodin (hydrocodone) in place of tramadol.  If pt willing to try this, will need to come in on Monday to pick up written Rx.

## 2016-06-10 NOTE — Telephone Encounter (Signed)
Called pt to discuss lovenox bridging for up coming procedure for cement injection in back on 06/16/16.  Pt is willing to do Lovenox.  Scheduled made for pt to follow and husband will come pick it up.  11/22  Take last dose of coumadin 11/23  No lovenox or coumadin 11/24  Lovenox 80mg  sq at 8am 11/25  Lovenox 80mg  sq at 8am 11/26  Lovenox 80mg  sq at 8am 11/27  Lovenox 80mg  sq at 8am 11/28  No lovenox-----procedure---coumadin 3.75mg  pm 11/29  Lovenox 80mg  sq  8am & coumadin 3.75mg  pm  11/30  Lovenox 80mg  sq  8am & coumadin 3.75mg  pm  12/1    Lovenox 80mg  sq  8am & coumadin 3.75mg  pm  12/2    Lovenox 80mg  sq  8am & coumadin 3.75mg  pm  12/3    Lovenox 80mg  sq  8am & coumadin 2.5mg  pm  12/4    Lovenox 80mg  sq  8am & INR appt @ 11:10am  Lovenox 80mg  sq daily at 8am Rx sent to [facility] #10 syringes x 1 refill

## 2016-06-13 DIAGNOSIS — E119 Type 2 diabetes mellitus without complications: Secondary | ICD-10-CM | POA: Diagnosis not present

## 2016-06-13 DIAGNOSIS — I110 Hypertensive heart disease with heart failure: Secondary | ICD-10-CM | POA: Diagnosis not present

## 2016-06-13 DIAGNOSIS — F4323 Adjustment disorder with mixed anxiety and depressed mood: Secondary | ICD-10-CM | POA: Diagnosis not present

## 2016-06-13 DIAGNOSIS — Z7901 Long term (current) use of anticoagulants: Secondary | ICD-10-CM | POA: Diagnosis not present

## 2016-06-13 DIAGNOSIS — I50810 Right heart failure, unspecified: Secondary | ICD-10-CM | POA: Diagnosis not present

## 2016-06-13 DIAGNOSIS — E039 Hypothyroidism, unspecified: Secondary | ICD-10-CM | POA: Diagnosis not present

## 2016-06-13 DIAGNOSIS — M8008XG Age-related osteoporosis with current pathological fracture, vertebra(e), subsequent encounter for fracture with delayed healing: Secondary | ICD-10-CM | POA: Diagnosis not present

## 2016-06-13 DIAGNOSIS — D509 Iron deficiency anemia, unspecified: Secondary | ICD-10-CM | POA: Diagnosis not present

## 2016-06-13 DIAGNOSIS — H3530 Unspecified macular degeneration: Secondary | ICD-10-CM | POA: Diagnosis not present

## 2016-06-13 DIAGNOSIS — M48061 Spinal stenosis, lumbar region without neurogenic claudication: Secondary | ICD-10-CM | POA: Diagnosis not present

## 2016-06-13 DIAGNOSIS — I10 Essential (primary) hypertension: Secondary | ICD-10-CM | POA: Diagnosis not present

## 2016-06-13 DIAGNOSIS — I481 Persistent atrial fibrillation: Secondary | ICD-10-CM | POA: Diagnosis not present

## 2016-06-15 ENCOUNTER — Other Ambulatory Visit: Payer: Self-pay | Admitting: Radiology

## 2016-06-15 ENCOUNTER — Other Ambulatory Visit: Payer: Self-pay | Admitting: Student

## 2016-06-16 ENCOUNTER — Encounter (HOSPITAL_COMMUNITY): Payer: Self-pay | Admitting: Interventional Radiology

## 2016-06-16 ENCOUNTER — Other Ambulatory Visit (HOSPITAL_COMMUNITY): Payer: Self-pay | Admitting: Interventional Radiology

## 2016-06-16 ENCOUNTER — Ambulatory Visit (HOSPITAL_COMMUNITY)
Admission: RE | Admit: 2016-06-16 | Discharge: 2016-06-16 | Disposition: A | Discharge status: Home / Self Care | Payer: Medicare Other | Source: Ambulatory Visit | Attending: Interventional Radiology | Admitting: Interventional Radiology

## 2016-06-16 DIAGNOSIS — Z8673 Personal history of transient ischemic attack (TIA), and cerebral infarction without residual deficits: Secondary | ICD-10-CM | POA: Diagnosis not present

## 2016-06-16 DIAGNOSIS — N182 Chronic kidney disease, stage 2 (mild): Secondary | ICD-10-CM | POA: Diagnosis not present

## 2016-06-16 DIAGNOSIS — M4856XA Collapsed vertebra, not elsewhere classified, lumbar region, initial encounter for fracture: Secondary | ICD-10-CM | POA: Diagnosis not present

## 2016-06-16 DIAGNOSIS — E1122 Type 2 diabetes mellitus with diabetic chronic kidney disease: Secondary | ICD-10-CM | POA: Insufficient documentation

## 2016-06-16 DIAGNOSIS — M4850XS Collapsed vertebra, not elsewhere classified, site unspecified, sequela of fracture: Principal | ICD-10-CM

## 2016-06-16 DIAGNOSIS — Z7901 Long term (current) use of anticoagulants: Secondary | ICD-10-CM | POA: Insufficient documentation

## 2016-06-16 DIAGNOSIS — Z87891 Personal history of nicotine dependence: Secondary | ICD-10-CM | POA: Diagnosis not present

## 2016-06-16 DIAGNOSIS — K219 Gastro-esophageal reflux disease without esophagitis: Secondary | ICD-10-CM | POA: Diagnosis not present

## 2016-06-16 DIAGNOSIS — Z8249 Family history of ischemic heart disease and other diseases of the circulatory system: Secondary | ICD-10-CM | POA: Insufficient documentation

## 2016-06-16 DIAGNOSIS — IMO0001 Reserved for inherently not codable concepts without codable children: Secondary | ICD-10-CM

## 2016-06-16 DIAGNOSIS — W19XXXA Unspecified fall, initial encounter: Secondary | ICD-10-CM | POA: Diagnosis not present

## 2016-06-16 DIAGNOSIS — I129 Hypertensive chronic kidney disease with stage 1 through stage 4 chronic kidney disease, or unspecified chronic kidney disease: Secondary | ICD-10-CM | POA: Insufficient documentation

## 2016-06-16 DIAGNOSIS — E039 Hypothyroidism, unspecified: Secondary | ICD-10-CM | POA: Insufficient documentation

## 2016-06-16 DIAGNOSIS — I480 Paroxysmal atrial fibrillation: Secondary | ICD-10-CM | POA: Insufficient documentation

## 2016-06-16 DIAGNOSIS — S32039A Unspecified fracture of third lumbar vertebra, initial encounter for closed fracture: Secondary | ICD-10-CM | POA: Insufficient documentation

## 2016-06-16 DIAGNOSIS — M810 Age-related osteoporosis without current pathological fracture: Secondary | ICD-10-CM | POA: Diagnosis not present

## 2016-06-16 DIAGNOSIS — S32029A Unspecified fracture of second lumbar vertebra, initial encounter for closed fracture: Secondary | ICD-10-CM | POA: Insufficient documentation

## 2016-06-16 DIAGNOSIS — Z79899 Other long term (current) drug therapy: Secondary | ICD-10-CM | POA: Diagnosis not present

## 2016-06-16 DIAGNOSIS — M545 Low back pain: Secondary | ICD-10-CM | POA: Diagnosis not present

## 2016-06-16 HISTORY — PX: IR GENERIC HISTORICAL: IMG1180011

## 2016-06-16 LAB — CBC WITH DIFFERENTIAL/PLATELET
Basophils Absolute: 0 10*3/uL (ref 0.0–0.1)
Basophils Relative: 0 %
EOS ABS: 0.1 10*3/uL (ref 0.0–0.7)
Eosinophils Relative: 1 %
HCT: 42.2 % (ref 36.0–46.0)
HEMOGLOBIN: 14.2 g/dL (ref 12.0–15.0)
LYMPHS ABS: 1.8 10*3/uL (ref 0.7–4.0)
LYMPHS PCT: 25 %
MCH: 34.1 pg — AB (ref 26.0–34.0)
MCHC: 33.6 g/dL (ref 30.0–36.0)
MCV: 101.4 fL — AB (ref 78.0–100.0)
Monocytes Absolute: 0.9 10*3/uL (ref 0.1–1.0)
Monocytes Relative: 12 %
NEUTROS ABS: 4.4 10*3/uL (ref 1.7–7.7)
NEUTROS PCT: 62 %
Platelets: 426 10*3/uL — ABNORMAL HIGH (ref 150–400)
RBC: 4.16 MIL/uL (ref 3.87–5.11)
RDW: 16.4 % — ABNORMAL HIGH (ref 11.5–15.5)
WBC: 7.1 10*3/uL (ref 4.0–10.5)

## 2016-06-16 LAB — PROTIME-INR
INR: 1.19
PROTHROMBIN TIME: 15.2 s (ref 11.4–15.2)

## 2016-06-16 LAB — BASIC METABOLIC PANEL
ANION GAP: 12 (ref 5–15)
BUN: 14 mg/dL (ref 6–20)
CALCIUM: 9 mg/dL (ref 8.9–10.3)
CO2: 26 mmol/L (ref 22–32)
Chloride: 101 mmol/L (ref 101–111)
Creatinine, Ser: 0.63 mg/dL (ref 0.44–1.00)
GFR calc non Af Amer: >60 mL/min (ref >60)
Glucose, Bld: 117 mg/dL — ABNORMAL HIGH (ref 65–99)
Potassium: 2.8 mmol/L — ABNORMAL LOW (ref 3.5–5.1)
Sodium: 139 mmol/L (ref 135–145)

## 2016-06-16 LAB — APTT: aPTT: 30 seconds (ref 24–36)

## 2016-06-16 MED ORDER — TOBRAMYCIN SULFATE 1.2 G IJ SOLR
INTRAMUSCULAR | Status: AC
  Filled 2016-06-16: qty 1.2

## 2016-06-16 MED ORDER — IOPAMIDOL (ISOVUE-300) INJECTION 61%
INTRAVENOUS | Status: AC
  Administered 2016-06-16: 10 mL
  Filled 2016-06-16: qty 50

## 2016-06-16 MED ORDER — FENTANYL CITRATE (PF) 100 MCG/2ML IJ SOLN
INTRAMUSCULAR | Status: AC | PRN
  Administered 2016-06-16: 25 ug via INTRAVENOUS
  Administered 2016-06-16: 12.5 ug via INTRAVENOUS
  Administered 2016-06-16: 25 ug via INTRAVENOUS
  Administered 2016-06-16: 12.5 ug via INTRAVENOUS

## 2016-06-16 MED ORDER — CEFAZOLIN SODIUM-DEXTROSE 2-4 GM/100ML-% IV SOLN
2.0000 g | INTRAVENOUS | Status: AC
  Administered 2016-06-16: 2000 mg via INTRAVENOUS

## 2016-06-16 MED ORDER — SODIUM CHLORIDE 0.9 % IV SOLN
INTRAVENOUS | Status: AC | PRN
  Administered 2016-06-16: 10 mL/h via INTRAVENOUS

## 2016-06-16 MED ORDER — MIDAZOLAM HCL 2 MG/2ML IJ SOLN
INTRAMUSCULAR | Status: AC
  Filled 2016-06-16: qty 4

## 2016-06-16 MED ORDER — BUPIVACAINE HCL 0.25 % IJ SOLN
INTRAMUSCULAR | Status: AC | PRN
  Administered 2016-06-16: 30 mL

## 2016-06-16 MED ORDER — BUPIVACAINE HCL (PF) 0.25 % IJ SOLN
INTRAMUSCULAR | Status: AC
  Filled 2016-06-16: qty 30

## 2016-06-16 MED ORDER — SODIUM CHLORIDE 0.9 % IV SOLN: INTRAVENOUS | Status: AC

## 2016-06-16 MED ORDER — SODIUM CHLORIDE 0.9 % IV SOLN
INTRAVENOUS | Status: DC
  Administered 2016-06-16: 08:00:00 via INTRAVENOUS

## 2016-06-16 MED ORDER — FELODIPINE ER 2.5 MG PO TB24
2.5000 mg | ORAL_TABLET | Freq: Once | ORAL | Status: AC
  Administered 2016-06-16: 2.5 mg via ORAL
  Filled 2016-06-16: qty 1

## 2016-06-16 MED ORDER — CEFAZOLIN SODIUM-DEXTROSE 2-4 GM/100ML-% IV SOLN
INTRAVENOUS | Status: AC
  Administered 2016-06-16: 2000 mg via INTRAVENOUS
  Filled 2016-06-16: qty 100

## 2016-06-16 MED ORDER — MIDAZOLAM HCL 2 MG/2ML IJ SOLN
INTRAMUSCULAR | Status: AC | PRN
  Administered 2016-06-16: 0.5 mg via INTRAVENOUS
  Administered 2016-06-16: 1 mg via INTRAVENOUS
  Administered 2016-06-16: 0.5 mg via INTRAVENOUS
  Administered 2016-06-16: 1 mg via INTRAVENOUS

## 2016-06-16 MED ORDER — HYDROMORPHONE HCL 1 MG/ML IJ SOLN
INTRAMUSCULAR | Status: AC
  Filled 2016-06-16: qty 1

## 2016-06-16 MED ORDER — FENTANYL CITRATE (PF) 100 MCG/2ML IJ SOLN
INTRAMUSCULAR | Status: AC
  Filled 2016-06-16: qty 2

## 2016-06-16 MED ORDER — TOBRAMYCIN SULFATE 1.2 G IJ SOLR
INTRAMUSCULAR | Status: AC
  Administered 2016-06-16: 0.5 g
  Filled 2016-06-16: qty 1.2

## 2016-06-16 NOTE — H&P (Signed)
Chief Complaint: Back Pain  Supervising Physician: Luanne Bras  Patient Status: [facility] - Out-pt  History of Present Illness: [name] is a 82 y.o. female who complains of persistent low back pain which she has had for the past 5 months.  She has had severe back pain for at least 5 months following a fall.  Her pain has been persistent ranging on a scale of 11/10 to presently about 6-7/10.  Her pain is primarily in the lower lumbar region with intermittent radiation to the outer lateral aspect of the left thigh and occasionally below the knee.  She denies any paresthesias associated with this weakness.  The patient is able to ambulate with a walker without much difficulty. However, she is no longer able to cope with her daily chores over the past 5 months.  The patient at the present time is on constant round-the-clock opioid pain killers to alleviate the pain. Even with those, the patient reports a 6/10.  She denies any chills, fever or rigors.  She is NPO.  She takes Warfarin for Afib and has bridged this with Lovenox. Her last dose of Lovenox was yesterday.  Past Medical History:  Diagnosis Date  . Abnormal drug screen 06/2015  . Anemia   . Anxiety   . Aortic sclerosis   . Arthritis   . Chronic gastritis 10/2009   Duodenitis on EGD  . CKD (chronic kidney disease) stage 2, GFR 60-89 ml/min   . Collagen vascular disease ([place])   . COPD (chronic obstructive pulmonary disease) ([place])   . Diabetes type 2, controlled ([place])    Diet controlled  . Diverticulosis   . Dry ARMD    Retinal hemorrhage (09/2013) [place]  . Essential hypertension, benign   . GERD (gastroesophageal reflux disease)   . Glaucoma    Dr. Venetia Maxon  . Hiatal hernia 2011   Small  . History of pelvic fracture April 2013  . History of rheumatic fever   . History of skin cancer   . History of TIA (transient ischemic attack)   . Hypothyroidism   . Macular degeneration   . Mixed  hyperlipidemia   . Osteoporosis 04/2013   Thoracic compression fracture, on bisphosphonate and cal/vit D  . Paroxysmal atrial fibrillation (HCC)   . Pulmonary nodules    Stable bilateral on CT 01/2010, rec rpt yearly for 2 yrs, pt decided to stop f/u  . Seasonal allergies     Past Surgical History:  Procedure Laterality Date  . BREAST LUMPECTOMY  1983   LEFT, benign  . Carotid US  2011   mild plaque formation  . CATARACT EXTRACTION  2012   RIGHT  . CESAREAN SECTION     (and h/o 6 miscarriages)  . CHOLECYSTECTOMY N/A 02/27/2013   Procedure: LAPAROSCOPIC CHOLECYSTECTOMY;  Surgeon: Donato Heinz, MD;  Location: [facility]S;  Service: General;  Laterality: N/A;  . COLONOSCOPY  03/2010   int hemorrhoids, diverticulosis, no need to repeat (Dr. Sydell Axon)  . DEXA  05/2010   T score -4.5 spine, -2.4 femur; does not want to repeat  . DEXA  04/2013   T score 3.9 [facility] spine, 2.6 hip  . DILATION AND CURETTAGE OF UTERUS     x2  . ESOPHAGOGASTRODUODENOSCOPY  10/2009   small HH, multiple antric ulcerations s/p biopsy, mild chronic gastritis/duodenitis  . ESOPHAGOGASTRODUODENOSCOPY  05/26/2012   RMR: small HH, o/w normal.   . IR GENERIC HISTORICAL  04/16/2016   IR RADIOLOGIST EVAL &  MGMT 04/16/2016 [facility]-INTERV RAD  . LAPAROSCOPIC CHOLECYSTECTOMY  02/2013   Dr. Geroge Baseman  . PATELLA FRACTURE SURGERY  05/2006   LEFT surgically repaired with pins and wire  . TONSILLECTOMY      Allergies: Propoxyphene n-acetaminophen; Codeine; Meperidine hcl; Morphine; Shellfish allergy; and Tramadol  Medications: Prior to Admission medications   Medication Sig Start Date End Date Taking? Authorizing Provider  acetaminophen (TYLENOL) 500 MG tablet Take 2 tablets (1,000 mg total) by mouth 3 (three) times daily. 12/23/15  Yes Ria Bush, MD  calcitonin, salmon, (MIACALCIN/FORTICAL) 200 UNIT/ACT nasal spray Place 1 spray into alternate nostrils daily. 02/24/16  Yes Ria Bush, MD  Calcium-Magnesium-Vitamin D  [phone] MG-MG-UNIT TABS Take 1 tablet by mouth 2 (two) times daily.   Yes Historical Provider, MD  clonazePAM (KLONOPIN) 0.5 MG tablet Take 1 tablet (0.5 mg total) by mouth at bedtime as needed for anxiety. 06/01/16  Yes Ria Bush, MD  enoxaparin (LOVENOX) 80 MG/0.8ML injection Inject 0.8 mLs (80 mg total) into the skin daily. 06/12/16 06/22/16 Yes Satira Sark, MD  felodipine (PLENDIL) 2.5 MG 24 hr tablet TAKE 1 TABLET DAILY 05/25/16  Yes Ria Bush, MD  ferrous sulfate 325 (65 FE) MG tablet Take 325 mg by mouth daily with breakfast.   Yes Historical Provider, MD  furosemide (LASIX) 20 MG tablet Take 2 tablets (40 mg total) by mouth daily as needed. 06/09/16  Yes Ria Bush, MD  latanoprost (XALATAN) 0.005 % ophthalmic solution Place 1 drop into both eyes at bedtime.    Yes Historical Provider, MD  levothyroxine (SYNTHROID, LEVOTHROID) 50 MCG tablet Take 1 tablet (50 mcg total) by mouth daily. 06/01/16  Yes Ria Bush, MD  lidocaine (LIDODERM) 5 % Place 1 patch onto the skin daily. Remove & Discard patch within 12 hours or as directed by MD 05/01/16  Yes Ria Bush, MD  Multiple Vitamin (MULTIVITAMIN) capsule Take 1 capsule by mouth every morning.    Yes Historical Provider, MD  Multiple Vitamins-Minerals (ICAPS AREDS FORMULA PO) Take 1 tablet by mouth daily.    Yes Historical Provider, MD  Omega-3 Fatty Acids (FISH OIL CONCENTRATE) 1000 MG CAPS Take 2 capsules by mouth 2 (two) times daily.     Yes Historical Provider, MD  oxyCODONE (OXYCONTIN) 10 mg 12 hr tablet Take 1 tablet (10 mg total) by mouth daily. 06/01/16  Yes Ria Bush, MD  polyethylene glycol [facility] / GLYCOLAX) packet Take 17 g by mouth daily. 11/15/15  Yes Ria Bush, MD  potassium chloride SA (K-DUR,KLOR-CON) 20 MEQ tablet Take 1 tablet (20 mEq total) by mouth daily as needed. Patient taking differently: Take 20 mEq by mouth daily as needed (along with Lasix).  01/20/16  Yes Ria Bush, MD  sennosides-docusate sodium (SENOKOT-S) 8.6-50 MG tablet Take 1 tablet by mouth daily. 02/24/16  Yes Ria Bush, MD  sertraline (ZOLOFT) 50 MG tablet Take 1 tablet (50 mg total) by mouth daily. 02/18/16  Yes Ria Bush, MD  traMADol (ULTRAM) 50 MG tablet Take 0.5-1 tablets (25-50 mg total) by mouth every 8 (eight) hours as needed for moderate pain. 06/01/16  Yes Ria Bush, MD  vitamin B-12 (CYANOCOBALAMIN) 500 MCG tablet Take 500 mcg by mouth daily.   Yes Historical Provider, MD  warfarin (COUMADIN) 2.5 MG tablet Take 1 tablet daily except 1 1/2 tablets on Mondays and Thursdays 04/28/16  Yes Satira Sark, MD     Family History  Problem Relation Age of Onset  . Coronary artery disease Mother 50  MI deceased  . Colon cancer Father 55  . Arrhythmia Sister   . Stroke Paternal Grandmother   . Diabetes Neg Hx     Social History   Social History  . Marital status: Married    Spouse name: N/A  . Number of children: N/A  . Years of education: N/A   Occupational History  . Retired     Former Therapist, sports   Social History Main Topics  . Smoking status: Former Smoker    Packs/day: 1.00    Years: 20.00    Types: Cigarettes    Quit date: 10/13/1998  . Smokeless tobacco: Never Used  . Alcohol use No  . Drug use: No  . Sexual activity: No   Other Topics Concern  . 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     Review of Systems: A 12 point ROS discussed  Review of Systems  Constitutional: Positive for activity change, appetite change and unexpected weight change. Negative for chills, fatigue and fever.  HENT: Negative.   Respiratory: Negative.   Cardiovascular: Negative.     Gastrointestinal: Negative.   Genitourinary: Negative.   Musculoskeletal: Positive for back pain and gait problem.  Skin: Negative.   Hematological: Negative.   Psychiatric/Behavioral: Negative.     Vital Signs: BP (!) 177/100   Pulse 92   Temp 97.4 F (36.3 C)   Resp 18   Ht 5\' 4"  (1.626 m)   Wt 109 lb (49.4 kg)   SpO2 95%   BMI 18.71 kg/m   Physical Exam  Constitutional: She is oriented to person, place, and time.  Thin, frail, eldery, NAD  HENT:  Head: Normocephalic and atraumatic.  Eyes: EOM are normal.  Neck: Normal range of motion.  Cardiovascular: Normal rate.   No murmur heard. Irregular heart rhythm c/w Afib.  Pulmonary/Chest: Effort normal.  Abdominal: Soft. She exhibits no distension. There is no tenderness.  Musculoskeletal:       Lumbar back: She exhibits tenderness and pain.       Back:  Neurological: She is alert and oriented to person, place, and time.  Skin: Skin is warm and dry.  Psychiatric: She has a normal mood and affect. Her behavior is normal. Judgment and thought content normal.  Vitals reviewed.   Mallampati Score:  MD Evaluation Airway: WNL Heart: WNL Abdomen: WNL Chest/ Lungs: WNL ASA  Classification: 3  Imaging: Dg Lumbar Spine Complete  Result Date: 06/01/2016 CLINICAL DATA:  Osteoporosis, compression fracture with delayed healing, subsequent encounter. Increased pain. EXAM: LUMBAR SPINE - COMPLETE 4+ VIEW COMPARISON:  02/19/2016 and TB chest 12/29/2015. FINDINGS: Dextro convex scoliosis. Osteopenia. T8 compression deformity, likely stable from 12/29/2015. T10 compression fracture appears stable. T12 and L2 compression fractures appear progressive. L3 compression fracture is stable, as is the superior endplate compression fracture of L4. Alignment is otherwise anatomic. Endplate degenerative changes are seen throughout. Atherosclerotic calcification of the arterial vasculature. IMPRESSION: 1. In T12 and L2 compression fractures  appear progressive. 2. T8, T10, L3 and L4 compression fractures appear stable. 3. Osteopenia. 4.  Aortic atherosclerosis (ICD10-170.0). Electronically Signed   By: Lorin Picket M.D.  On: 06/01/2016 16:56   CLINICAL DATA:  Back pain extending to both knees. History of compression fracture.  EXAM: MRI LUMBAR SPINE WITHOUT CONTRAST  TECHNIQUE: Multiplanar, multisequence MR imaging of the lumbar spine was performed. No intravenous contrast was administered.  COMPARISON:  MRI L-spine 12/21/2015 and L-spine radiograph 02/19/2016.  FINDINGS: Segmentation: Normal. The lowest disc space is considered to be L5-S1.  Alignment:  There is left convex curvature of the lumbar spine.  Vertebrae: There are compression fractures of the L2 and L3 vertebral bodies with approximately 50% height loss. Both levels have progressed compared to the prior MRI of 12/21/2015. The L2 fracture has progressed compared to the radiograph of 02/19/2016. There is associated marrow edema within the L2 vertebral body. There is mild height loss at the T12, L1 and L4 levels without marrow edema.  Conus medullaris: Extends to the L1 level and appears normal.  Paraspinal and other soft tissues: The visualized aorta, IVC and iliac vessels are normal. The visualized retroperitoneal organs and paraspinal soft tissues are normal.  Disc levels:  T11-T12: Evaluated on sagittal images only. There is mild retropulsion at the posterior superior T12 endplate. No spinal canal or neural foraminal stenosis.  T12-L1: No significant disc bulge. Normal facets. No spinal canal or neural foraminal stenosis.  L1-L2: Narrowing of the intervertebral disc space with retropulsion of the posterior superior endplate of L2. Moderate spinal canal stenosis. Mild right and severe left neuroforaminal stenosis.  L2-L3: Mild retropulsion at the posterior superior L3 endplate. Mild spinal canal stenosis. Moderate right and  mild left neuroforaminal stenosis.  L3-L4: Diffuse disc bulge and moderate bilateral facet hypertrophy. No spinal canal stenosis. Moderate bilateral neuroforaminal stenosis, unchanged.  L4-L5: Minimal disc bulge. No spinal canal stenosis. Moderate bilateral neuroforaminal stenosis, unchanged.  L5-S1: Moderate facet hypertrophy. Minimal disc bulge. No spinal canal stenosis. No neuroforaminal stenosis.  IMPRESSION: 1. Progression of L2 compression fracture with associated marrow edema, suggesting the fracture is acute to subacute. 2. No progression of L3 compression fracture, T12 superior endplate fracture or L4 superior endplate fracture. 3. Moderate spinal canal stenosis at L1-L2, primarily due to retropulsion of the posterior superior L2 endplate. 4. Severe neural foraminal stenosis at left L1-2 and moderate foraminal stenosis at multiple other levels.   Electronically Signed   By: Ulyses Jarred M.D.   On: 04/23/2016 21:52 Labs:  CBC:  Recent Labs  12/17/15 1158 12/29/15 0811 02/19/16 0957 06/16/16 0753  WBC 11.4* 8.9 10.2 7.1  HGB 12.0 10.4* 11.8* 14.2  HCT 37.3 31.8* 35.3* 42.2  PLT 667.0* 362 416* 426*    COAGS:  Recent Labs  04/28/16 05/06/16 1044 05/18/16 1036 06/01/16 1101  INR 1.5 1.7 3.6 1.5    BMP:  Recent Labs  10/18/15 0628 11/17/15 1112  12/29/15 0811 01/16/16 1335 02/19/16 0957 06/01/16 1543  NA 142 136  < > 138 143 135 142  K 4.0 3.9  < > 3.3* 3.1* 3.2* 3.6  CL 110 101  < > 108 103 100* 104  CO2 24 27  < > 26 31 27 28   GLUCOSE 179* 102*  < > 112* 97 126* 100*  BUN 15 14  < > 10 18 8 15   CALCIUM 8.2* 8.5*  < > 7.9* 10.2 8.8* 9.8  CREATININE 0.51 0.70  < > 0.50 0.66 0.53 0.59  GFRNONAA >60 >60  --  >60  --  >60  --   GFRAA >60 >60  --  >60  --  >60  --   < > =  values in this interval not displayed.  LIVER FUNCTION TESTS:  Recent Labs  10/18/15 0628 11/17/15 1112 12/17/15 1158 02/19/16 0957  BILITOT 0.7 0.8 0.5 1.0    AST 24 25 22 21   ALT 22 26 19 15   ALKPHOS 61 92 76 66  PROT 6.6 6.2* 7.1 6.8  ALBUMIN 2.8* 2.9* 3.8 3.1*    TUMOR MARKERS: No results for input(s): AFPTM, CEA, CA199, CHROMGRNA in the last 8760 hours.  Assessment and Plan:  L3-L4 Compression fracture  Will proceed with vertebral augmentation today by Dr. Estanislado Pandy.  Risks and Benefits discussed with the patient including, but not limited to education regarding the natural healing process of compression fractures without intervention, bleeding, infection, cement migration which may cause spinal cord damage, paralysis, pulmonary embolism or even death.  All of the patient's questions were answered, patient is agreeable to proceed. Consent signed and in chart.  Hypertension - patient did not take her usual home medications, these have been ordered and will be given pre-procedure.  Electronically Signed: Murrell Redden PA-C 06/16/2016, 8:29 AM   I spent a total of  25 Minutes in face to face in clinical consultation, greater than 50% of which was counseling/coordinating care for L

## 2016-06-16 NOTE — Progress Notes (Signed)
Gareth Eagle, PA in to see pt, informed of high blood pressure (170s-190s/100-110s). Plendil ordered and will be given upon arrival from pharmacy.

## 2016-06-16 NOTE — Discharge Instructions (Signed)
KYPHOPLASTY/VERTEBROPLASTY DISCHARGE INSTRUCTIONS  Medications: (check all that apply)     Resume all home medications as before procedure.           Restart coumadin tomorrow    Continue your pain medications as prescribed as needed.  Over the next 3-5 days, decrease your pain medication as tolerated.  Over the counter medications (i.e. Tylenol, ibuprofen, and aleve) may be substituted once severe/moderate pain symptoms have subsided.   Wound Care: - Bandages may be removed the day following your procedure.  You may get your incision wet once bandages are removed.  Bandaids may be used to cover the incisions until scab formation.  Topical ointments are optional.  - If you develop a fever greater than 101 degrees, have increased skin redness at the incision sites or pus-like oozing from incisions occurring within 1 week of the procedure, contact radiology at [phone] or [phone].  - Ice pack to back for 15-20 minutes 2-3 time per day for first 2-3 days post procedure.  The ice will expedite muscle healing and help with the pain from the incisions.   Activity: - Bedrest today with limited activity for 24 hours post procedure.  - No driving for 48 hours.  - Increase your activity as tolerated after bedrest (with assistance if necessary).  - Refrain from any strenuous activity or heavy lifting (greater than 10 lbs.).   Follow up: - Contact radiology at [phone] or [phone] if any questions/concerns.  - A physician assistant from radiology will contact you in approximately 1 week.  - If a biopsy was performed at the time of your procedure, your referring physician should receive the results in usually 2-3 days.

## 2016-06-16 NOTE — Procedures (Signed)
S/P L2 and L3 VPs

## 2016-06-18 ENCOUNTER — Other Ambulatory Visit (HOSPITAL_COMMUNITY): Payer: Self-pay | Admitting: Interventional Radiology

## 2016-06-18 ENCOUNTER — Telehealth: Payer: Self-pay

## 2016-06-18 ENCOUNTER — Other Ambulatory Visit: Payer: Self-pay | Admitting: Family Medicine

## 2016-06-18 DIAGNOSIS — I110 Hypertensive heart disease with heart failure: Secondary | ICD-10-CM | POA: Diagnosis not present

## 2016-06-18 DIAGNOSIS — I50810 Right heart failure, unspecified: Secondary | ICD-10-CM | POA: Diagnosis not present

## 2016-06-18 DIAGNOSIS — E119 Type 2 diabetes mellitus without complications: Secondary | ICD-10-CM | POA: Diagnosis not present

## 2016-06-18 DIAGNOSIS — M4850XS Collapsed vertebra, not elsewhere classified, site unspecified, sequela of fracture: Principal | ICD-10-CM

## 2016-06-18 DIAGNOSIS — I481 Persistent atrial fibrillation: Secondary | ICD-10-CM | POA: Diagnosis not present

## 2016-06-18 DIAGNOSIS — M48061 Spinal stenosis, lumbar region without neurogenic claudication: Secondary | ICD-10-CM | POA: Diagnosis not present

## 2016-06-18 DIAGNOSIS — M8008XG Age-related osteoporosis with current pathological fracture, vertebra(e), subsequent encounter for fracture with delayed healing: Secondary | ICD-10-CM | POA: Diagnosis not present

## 2016-06-18 DIAGNOSIS — IMO0001 Reserved for inherently not codable concepts without codable children: Secondary | ICD-10-CM

## 2016-06-18 NOTE — Telephone Encounter (Signed)
Almyra Free nurse with [facility] left v/m requesting verbal orders for home health PT and [facility] aide.

## 2016-06-18 NOTE — Telephone Encounter (Signed)
Agree  Thank you

## 2016-06-19 ENCOUNTER — Other Ambulatory Visit: Payer: Self-pay

## 2016-06-19 MED ORDER — OXYCODONE HCL ER 10 MG PO T12A: 10.0000 mg | EXTENDED_RELEASE_TABLET | Freq: Two times a day (BID) | ORAL | 0 refills | Status: DC

## 2016-06-19 MED ORDER — TRAMADOL HCL 50 MG PO TABS: 25.0000 mg | ORAL_TABLET | Freq: Three times a day (TID) | ORAL | 0 refills | Status: DC | PRN

## 2016-06-19 NOTE — Telephone Encounter (Signed)
Patient notified and Rx's placed up front for pick up. 

## 2016-06-19 NOTE — Telephone Encounter (Signed)
V/M left requesting rx for tramadol (last printed # 30 on 06/01/16) and oxycodone (last printed # 30 on 06/01/16). Pt last seen 06/01/16.[facility].

## 2016-06-19 NOTE — Telephone Encounter (Signed)
Message left advising Almyra Free.

## 2016-06-19 NOTE — Telephone Encounter (Signed)
Printed and in Kim's box 

## 2016-06-20 ENCOUNTER — Other Ambulatory Visit: Payer: Self-pay | Admitting: Family Medicine

## 2016-06-20 ENCOUNTER — Encounter: Payer: Self-pay | Admitting: Family Medicine

## 2016-06-22 ENCOUNTER — Ambulatory Visit (INDEPENDENT_AMBULATORY_CARE_PROVIDER_SITE_OTHER): Payer: Medicare Other | Admitting: *Deleted

## 2016-06-22 DIAGNOSIS — I481 Persistent atrial fibrillation: Secondary | ICD-10-CM

## 2016-06-22 DIAGNOSIS — Z5181 Encounter for therapeutic drug level monitoring: Secondary | ICD-10-CM

## 2016-06-22 DIAGNOSIS — G458 Other transient cerebral ischemic attacks and related syndromes: Secondary | ICD-10-CM | POA: Diagnosis not present

## 2016-06-22 DIAGNOSIS — I4819 Other persistent atrial fibrillation: Secondary | ICD-10-CM

## 2016-06-22 LAB — POCT INR: INR: 1.5

## 2016-06-24 DIAGNOSIS — M48061 Spinal stenosis, lumbar region without neurogenic claudication: Secondary | ICD-10-CM | POA: Diagnosis not present

## 2016-06-24 DIAGNOSIS — I481 Persistent atrial fibrillation: Secondary | ICD-10-CM | POA: Diagnosis not present

## 2016-06-24 DIAGNOSIS — M8008XG Age-related osteoporosis with current pathological fracture, vertebra(e), subsequent encounter for fracture with delayed healing: Secondary | ICD-10-CM | POA: Diagnosis not present

## 2016-06-24 DIAGNOSIS — E119 Type 2 diabetes mellitus without complications: Secondary | ICD-10-CM | POA: Diagnosis not present

## 2016-06-24 DIAGNOSIS — I50810 Right heart failure, unspecified: Secondary | ICD-10-CM | POA: Diagnosis not present

## 2016-06-24 DIAGNOSIS — I110 Hypertensive heart disease with heart failure: Secondary | ICD-10-CM | POA: Diagnosis not present

## 2016-06-25 ENCOUNTER — Encounter (HOSPITAL_COMMUNITY): Payer: Self-pay | Admitting: Interventional Radiology

## 2016-06-25 DIAGNOSIS — M48061 Spinal stenosis, lumbar region without neurogenic claudication: Secondary | ICD-10-CM | POA: Diagnosis not present

## 2016-06-25 DIAGNOSIS — I481 Persistent atrial fibrillation: Secondary | ICD-10-CM | POA: Diagnosis not present

## 2016-06-25 DIAGNOSIS — I50810 Right heart failure, unspecified: Secondary | ICD-10-CM | POA: Diagnosis not present

## 2016-06-25 DIAGNOSIS — I110 Hypertensive heart disease with heart failure: Secondary | ICD-10-CM | POA: Diagnosis not present

## 2016-06-25 DIAGNOSIS — M8008XG Age-related osteoporosis with current pathological fracture, vertebra(e), subsequent encounter for fracture with delayed healing: Secondary | ICD-10-CM | POA: Diagnosis not present

## 2016-06-25 DIAGNOSIS — E119 Type 2 diabetes mellitus without complications: Secondary | ICD-10-CM | POA: Diagnosis not present

## 2016-06-26 DIAGNOSIS — E119 Type 2 diabetes mellitus without complications: Secondary | ICD-10-CM | POA: Diagnosis not present

## 2016-06-26 DIAGNOSIS — I110 Hypertensive heart disease with heart failure: Secondary | ICD-10-CM | POA: Diagnosis not present

## 2016-06-26 DIAGNOSIS — I481 Persistent atrial fibrillation: Secondary | ICD-10-CM | POA: Diagnosis not present

## 2016-06-26 DIAGNOSIS — M8008XG Age-related osteoporosis with current pathological fracture, vertebra(e), subsequent encounter for fracture with delayed healing: Secondary | ICD-10-CM | POA: Diagnosis not present

## 2016-06-26 DIAGNOSIS — M48061 Spinal stenosis, lumbar region without neurogenic claudication: Secondary | ICD-10-CM | POA: Diagnosis not present

## 2016-06-26 DIAGNOSIS — I50810 Right heart failure, unspecified: Secondary | ICD-10-CM | POA: Diagnosis not present

## 2016-06-29 ENCOUNTER — Telehealth: Payer: Self-pay

## 2016-06-29 ENCOUNTER — Telehealth: Payer: Self-pay | Admitting: *Deleted

## 2016-06-29 DIAGNOSIS — I50810 Right heart failure, unspecified: Secondary | ICD-10-CM | POA: Diagnosis not present

## 2016-06-29 DIAGNOSIS — I110 Hypertensive heart disease with heart failure: Secondary | ICD-10-CM | POA: Diagnosis not present

## 2016-06-29 DIAGNOSIS — I481 Persistent atrial fibrillation: Secondary | ICD-10-CM | POA: Diagnosis not present

## 2016-06-29 DIAGNOSIS — E119 Type 2 diabetes mellitus without complications: Secondary | ICD-10-CM | POA: Diagnosis not present

## 2016-06-29 DIAGNOSIS — M48061 Spinal stenosis, lumbar region without neurogenic claudication: Secondary | ICD-10-CM | POA: Diagnosis not present

## 2016-06-29 DIAGNOSIS — M8008XG Age-related osteoporosis with current pathological fracture, vertebra(e), subsequent encounter for fracture with delayed healing: Secondary | ICD-10-CM | POA: Diagnosis not present

## 2016-06-29 NOTE — Telephone Encounter (Signed)
[name] with [facility] left v/m; [name] concerned with pts breathing and congestion. Pt is not taking deep breaths or coughing due to pain in back. When listens to lungs has congestion upper left lobe. [name] will monitor and keep PCP informed. FYI to Dr Damita Dunnings.

## 2016-06-29 NOTE — Telephone Encounter (Signed)
If progressive sx either update [place] or needs OV.  Thanks.

## 2016-06-29 NOTE — Telephone Encounter (Signed)
Please give pt a call --home health did not check pt today and they're not sure of dosing

## 2016-06-29 NOTE — Telephone Encounter (Signed)
No answer at home.  LMOM for pt to take coumadin 3.75mg  tonight and I called [facility] to check INR tomorrow.

## 2016-06-29 NOTE — Telephone Encounter (Signed)
[name] with [facility] advised.

## 2016-06-30 ENCOUNTER — Telehealth: Payer: Self-pay | Admitting: Cardiology

## 2016-06-30 ENCOUNTER — Ambulatory Visit (INDEPENDENT_AMBULATORY_CARE_PROVIDER_SITE_OTHER): Payer: Medicare Other | Admitting: *Deleted

## 2016-06-30 DIAGNOSIS — I50810 Right heart failure, unspecified: Secondary | ICD-10-CM | POA: Diagnosis not present

## 2016-06-30 DIAGNOSIS — I110 Hypertensive heart disease with heart failure: Secondary | ICD-10-CM | POA: Diagnosis not present

## 2016-06-30 DIAGNOSIS — E119 Type 2 diabetes mellitus without complications: Secondary | ICD-10-CM | POA: Diagnosis not present

## 2016-06-30 DIAGNOSIS — M8008XG Age-related osteoporosis with current pathological fracture, vertebra(e), subsequent encounter for fracture with delayed healing: Secondary | ICD-10-CM | POA: Diagnosis not present

## 2016-06-30 DIAGNOSIS — I4819 Other persistent atrial fibrillation: Secondary | ICD-10-CM

## 2016-06-30 DIAGNOSIS — I481 Persistent atrial fibrillation: Secondary | ICD-10-CM

## 2016-06-30 DIAGNOSIS — M48061 Spinal stenosis, lumbar region without neurogenic claudication: Secondary | ICD-10-CM | POA: Diagnosis not present

## 2016-06-30 DIAGNOSIS — G458 Other transient cerebral ischemic attacks and related syndromes: Secondary | ICD-10-CM

## 2016-06-30 DIAGNOSIS — Z5181 Encounter for therapeutic drug level monitoring: Secondary | ICD-10-CM

## 2016-06-30 LAB — POCT INR: INR: 2.9

## 2016-06-30 NOTE — Telephone Encounter (Signed)
Spoke with Almyra Free and order given.  See coumadin note.

## 2016-06-30 NOTE — Telephone Encounter (Signed)
Patient's INR is 2.9

## 2016-07-01 DIAGNOSIS — I110 Hypertensive heart disease with heart failure: Secondary | ICD-10-CM | POA: Diagnosis not present

## 2016-07-01 DIAGNOSIS — M48061 Spinal stenosis, lumbar region without neurogenic claudication: Secondary | ICD-10-CM | POA: Diagnosis not present

## 2016-07-01 DIAGNOSIS — I481 Persistent atrial fibrillation: Secondary | ICD-10-CM | POA: Diagnosis not present

## 2016-07-01 DIAGNOSIS — M8008XG Age-related osteoporosis with current pathological fracture, vertebra(e), subsequent encounter for fracture with delayed healing: Secondary | ICD-10-CM | POA: Diagnosis not present

## 2016-07-01 DIAGNOSIS — E119 Type 2 diabetes mellitus without complications: Secondary | ICD-10-CM | POA: Diagnosis not present

## 2016-07-01 DIAGNOSIS — I50810 Right heart failure, unspecified: Secondary | ICD-10-CM | POA: Diagnosis not present

## 2016-07-02 ENCOUNTER — Ambulatory Visit (HOSPITAL_COMMUNITY): Payer: Medicare Other

## 2016-07-02 DIAGNOSIS — E119 Type 2 diabetes mellitus without complications: Secondary | ICD-10-CM | POA: Diagnosis not present

## 2016-07-02 DIAGNOSIS — I481 Persistent atrial fibrillation: Secondary | ICD-10-CM | POA: Diagnosis not present

## 2016-07-02 DIAGNOSIS — M8008XG Age-related osteoporosis with current pathological fracture, vertebra(e), subsequent encounter for fracture with delayed healing: Secondary | ICD-10-CM | POA: Diagnosis not present

## 2016-07-02 DIAGNOSIS — M48061 Spinal stenosis, lumbar region without neurogenic claudication: Secondary | ICD-10-CM | POA: Diagnosis not present

## 2016-07-02 DIAGNOSIS — I110 Hypertensive heart disease with heart failure: Secondary | ICD-10-CM | POA: Diagnosis not present

## 2016-07-02 DIAGNOSIS — I50810 Right heart failure, unspecified: Secondary | ICD-10-CM | POA: Diagnosis not present

## 2016-07-03 DIAGNOSIS — I110 Hypertensive heart disease with heart failure: Secondary | ICD-10-CM | POA: Diagnosis not present

## 2016-07-03 DIAGNOSIS — E119 Type 2 diabetes mellitus without complications: Secondary | ICD-10-CM | POA: Diagnosis not present

## 2016-07-03 DIAGNOSIS — M48061 Spinal stenosis, lumbar region without neurogenic claudication: Secondary | ICD-10-CM | POA: Diagnosis not present

## 2016-07-03 DIAGNOSIS — I481 Persistent atrial fibrillation: Secondary | ICD-10-CM | POA: Diagnosis not present

## 2016-07-03 DIAGNOSIS — M8008XG Age-related osteoporosis with current pathological fracture, vertebra(e), subsequent encounter for fracture with delayed healing: Secondary | ICD-10-CM | POA: Diagnosis not present

## 2016-07-03 DIAGNOSIS — I50810 Right heart failure, unspecified: Secondary | ICD-10-CM | POA: Diagnosis not present

## 2016-07-06 DIAGNOSIS — M8008XG Age-related osteoporosis with current pathological fracture, vertebra(e), subsequent encounter for fracture with delayed healing: Secondary | ICD-10-CM | POA: Diagnosis not present

## 2016-07-06 DIAGNOSIS — I481 Persistent atrial fibrillation: Secondary | ICD-10-CM | POA: Diagnosis not present

## 2016-07-06 DIAGNOSIS — M48061 Spinal stenosis, lumbar region without neurogenic claudication: Secondary | ICD-10-CM | POA: Diagnosis not present

## 2016-07-06 DIAGNOSIS — I50810 Right heart failure, unspecified: Secondary | ICD-10-CM | POA: Diagnosis not present

## 2016-07-06 DIAGNOSIS — I110 Hypertensive heart disease with heart failure: Secondary | ICD-10-CM | POA: Diagnosis not present

## 2016-07-06 DIAGNOSIS — E119 Type 2 diabetes mellitus without complications: Secondary | ICD-10-CM | POA: Diagnosis not present

## 2016-07-07 ENCOUNTER — Telehealth: Payer: Self-pay

## 2016-07-07 DIAGNOSIS — E119 Type 2 diabetes mellitus without complications: Secondary | ICD-10-CM | POA: Diagnosis not present

## 2016-07-07 DIAGNOSIS — I481 Persistent atrial fibrillation: Secondary | ICD-10-CM | POA: Diagnosis not present

## 2016-07-07 DIAGNOSIS — M8008XG Age-related osteoporosis with current pathological fracture, vertebra(e), subsequent encounter for fracture with delayed healing: Secondary | ICD-10-CM | POA: Diagnosis not present

## 2016-07-07 DIAGNOSIS — M48061 Spinal stenosis, lumbar region without neurogenic claudication: Secondary | ICD-10-CM | POA: Diagnosis not present

## 2016-07-07 DIAGNOSIS — I110 Hypertensive heart disease with heart failure: Secondary | ICD-10-CM | POA: Diagnosis not present

## 2016-07-07 DIAGNOSIS — I50810 Right heart failure, unspecified: Secondary | ICD-10-CM | POA: Diagnosis not present

## 2016-07-07 NOTE — Telephone Encounter (Signed)
Tara left v/m; last month pt got clearance from Dr Darnell Level to proceed with Boniva injections and pt was given # to schedule appt. Baxter Flattery tried to schedule appt and was advised she could not make appt on her own and request Dr [name] office to make appt.

## 2016-07-07 NOTE — Telephone Encounter (Signed)
plz schedule appt? 

## 2016-07-08 DIAGNOSIS — M8008XG Age-related osteoporosis with current pathological fracture, vertebra(e), subsequent encounter for fracture with delayed healing: Secondary | ICD-10-CM | POA: Diagnosis not present

## 2016-07-08 DIAGNOSIS — I110 Hypertensive heart disease with heart failure: Secondary | ICD-10-CM | POA: Diagnosis not present

## 2016-07-08 DIAGNOSIS — E119 Type 2 diabetes mellitus without complications: Secondary | ICD-10-CM | POA: Diagnosis not present

## 2016-07-08 DIAGNOSIS — I481 Persistent atrial fibrillation: Secondary | ICD-10-CM | POA: Diagnosis not present

## 2016-07-08 DIAGNOSIS — I50810 Right heart failure, unspecified: Secondary | ICD-10-CM | POA: Diagnosis not present

## 2016-07-08 DIAGNOSIS — M48061 Spinal stenosis, lumbar region without neurogenic claudication: Secondary | ICD-10-CM | POA: Diagnosis not present

## 2016-07-08 NOTE — Telephone Encounter (Signed)
Spoke with Laverne at [facility] because patient has always been able to make her own appt. She said apparently they never received the order (in chart under media). She advised to re-fax the order and have patient call tomorrow to schedule. Order refaxed and patient notified.

## 2016-07-10 DIAGNOSIS — I50810 Right heart failure, unspecified: Secondary | ICD-10-CM | POA: Diagnosis not present

## 2016-07-10 DIAGNOSIS — I110 Hypertensive heart disease with heart failure: Secondary | ICD-10-CM | POA: Diagnosis not present

## 2016-07-10 DIAGNOSIS — I481 Persistent atrial fibrillation: Secondary | ICD-10-CM | POA: Diagnosis not present

## 2016-07-10 DIAGNOSIS — M8008XG Age-related osteoporosis with current pathological fracture, vertebra(e), subsequent encounter for fracture with delayed healing: Secondary | ICD-10-CM | POA: Diagnosis not present

## 2016-07-10 DIAGNOSIS — M48061 Spinal stenosis, lumbar region without neurogenic claudication: Secondary | ICD-10-CM | POA: Diagnosis not present

## 2016-07-10 DIAGNOSIS — E119 Type 2 diabetes mellitus without complications: Secondary | ICD-10-CM | POA: Diagnosis not present

## 2016-07-11 DIAGNOSIS — M48061 Spinal stenosis, lumbar region without neurogenic claudication: Secondary | ICD-10-CM | POA: Diagnosis not present

## 2016-07-11 DIAGNOSIS — M8008XG Age-related osteoporosis with current pathological fracture, vertebra(e), subsequent encounter for fracture with delayed healing: Secondary | ICD-10-CM | POA: Diagnosis not present

## 2016-07-11 DIAGNOSIS — I50810 Right heart failure, unspecified: Secondary | ICD-10-CM | POA: Diagnosis not present

## 2016-07-11 DIAGNOSIS — I110 Hypertensive heart disease with heart failure: Secondary | ICD-10-CM | POA: Diagnosis not present

## 2016-07-11 DIAGNOSIS — E119 Type 2 diabetes mellitus without complications: Secondary | ICD-10-CM | POA: Diagnosis not present

## 2016-07-11 DIAGNOSIS — I481 Persistent atrial fibrillation: Secondary | ICD-10-CM | POA: Diagnosis not present

## 2016-07-12 ENCOUNTER — Emergency Department (HOSPITAL_COMMUNITY): Payer: Medicare Other

## 2016-07-12 ENCOUNTER — Encounter (HOSPITAL_COMMUNITY): Payer: Self-pay | Admitting: Emergency Medicine

## 2016-07-12 ENCOUNTER — Emergency Department (HOSPITAL_COMMUNITY)
Admission: EM | Admit: 2016-07-12 | Discharge: 2016-07-12 | Disposition: A | Discharge status: Home / Self Care | Payer: Medicare Other | Attending: Emergency Medicine | Admitting: Emergency Medicine

## 2016-07-12 DIAGNOSIS — R064 Hyperventilation: Secondary | ICD-10-CM | POA: Insufficient documentation

## 2016-07-12 DIAGNOSIS — I129 Hypertensive chronic kidney disease with stage 1 through stage 4 chronic kidney disease, or unspecified chronic kidney disease: Secondary | ICD-10-CM | POA: Diagnosis not present

## 2016-07-12 DIAGNOSIS — Z7901 Long term (current) use of anticoagulants: Secondary | ICD-10-CM | POA: Diagnosis not present

## 2016-07-12 DIAGNOSIS — E1122 Type 2 diabetes mellitus with diabetic chronic kidney disease: Secondary | ICD-10-CM | POA: Insufficient documentation

## 2016-07-12 DIAGNOSIS — N182 Chronic kidney disease, stage 2 (mild): Secondary | ICD-10-CM | POA: Insufficient documentation

## 2016-07-12 DIAGNOSIS — R0682 Tachypnea, not elsewhere classified: Secondary | ICD-10-CM | POA: Diagnosis not present

## 2016-07-12 DIAGNOSIS — R918 Other nonspecific abnormal finding of lung field: Secondary | ICD-10-CM | POA: Insufficient documentation

## 2016-07-12 DIAGNOSIS — R0602 Shortness of breath: Secondary | ICD-10-CM | POA: Diagnosis not present

## 2016-07-12 DIAGNOSIS — E039 Hypothyroidism, unspecified: Secondary | ICD-10-CM | POA: Insufficient documentation

## 2016-07-12 DIAGNOSIS — R062 Wheezing: Secondary | ICD-10-CM | POA: Diagnosis not present

## 2016-07-12 DIAGNOSIS — Z79899 Other long term (current) drug therapy: Secondary | ICD-10-CM | POA: Diagnosis not present

## 2016-07-12 DIAGNOSIS — J449 Chronic obstructive pulmonary disease, unspecified: Secondary | ICD-10-CM | POA: Diagnosis not present

## 2016-07-12 DIAGNOSIS — Z87891 Personal history of nicotine dependence: Secondary | ICD-10-CM | POA: Insufficient documentation

## 2016-07-12 LAB — BLOOD GAS, ARTERIAL
Acid-Base Excess: 1.4 mmol/L (ref 0.0–2.0)
Bicarbonate: 27.7 mmol/L (ref 20.0–28.0)
Drawn by: 221791
FIO2: 21
O2 Saturation: 97.6 %
PCO2 ART: 19.2 mmHg — AB (ref 32.0–48.0)
PH ART: 7.669 — AB (ref 7.350–7.450)
PO2 ART: 83.3 mmHg (ref 83.0–108.0)
Patient temperature: 37

## 2016-07-12 LAB — CBC WITH DIFFERENTIAL/PLATELET
Basophils Absolute: 0 10*3/uL (ref 0.0–0.1)
Basophils Relative: 0 %
EOS ABS: 0 10*3/uL (ref 0.0–0.7)
Eosinophils Relative: 0 %
HCT: 42.3 % (ref 36.0–46.0)
HEMOGLOBIN: 14.2 g/dL (ref 12.0–15.0)
LYMPHS ABS: 3.1 10*3/uL (ref 0.7–4.0)
Lymphocytes Relative: 39 %
MCH: 34.3 pg — AB (ref 26.0–34.0)
MCHC: 33.6 g/dL (ref 30.0–36.0)
MCV: 102.2 fL — ABNORMAL HIGH (ref 78.0–100.0)
Monocytes Absolute: 0.8 10*3/uL (ref 0.1–1.0)
Monocytes Relative: 10 %
NEUTROS ABS: 4 10*3/uL (ref 1.7–7.7)
NEUTROS PCT: 51 %
Platelets: 280 10*3/uL (ref 150–400)
RBC: 4.14 MIL/uL (ref 3.87–5.11)
RDW: 14.3 % (ref 11.5–15.5)
WBC: 8 10*3/uL (ref 4.0–10.5)

## 2016-07-12 LAB — COMPREHENSIVE METABOLIC PANEL
ALBUMIN: 3.6 g/dL (ref 3.5–5.0)
ALK PHOS: 77 U/L (ref 38–126)
ALT: 40 U/L (ref 14–54)
AST: 34 U/L (ref 15–41)
Anion gap: 10 (ref 5–15)
BUN: 14 mg/dL (ref 6–20)
CALCIUM: 9.6 mg/dL (ref 8.9–10.3)
CO2: 25 mmol/L (ref 22–32)
CREATININE: 0.64 mg/dL (ref 0.44–1.00)
Chloride: 104 mmol/L (ref 101–111)
GFR calc non Af Amer: >60 mL/min (ref >60)
GLUCOSE: 110 mg/dL — AB (ref 65–99)
Potassium: 3.2 mmol/L — ABNORMAL LOW (ref 3.5–5.1)
SODIUM: 139 mmol/L (ref 135–145)
Total Bilirubin: 0.6 mg/dL (ref 0.3–1.2)
Total Protein: 7.2 g/dL (ref 6.5–8.1)

## 2016-07-12 LAB — I-STAT TROPONIN, ED: Troponin i, poc: 0.02 ng/mL (ref 0.00–0.08)

## 2016-07-12 LAB — BRAIN NATRIURETIC PEPTIDE: B Natriuretic Peptide: 385 pg/mL — ABNORMAL HIGH (ref 0.0–100.0)

## 2016-07-12 MED ORDER — LORAZEPAM 2 MG/ML IJ SOLN
0.5000 mg | Freq: Once | INTRAMUSCULAR | Status: AC
  Administered 2016-07-12: 0.5 mg via INTRAVENOUS
  Filled 2016-07-12: qty 1

## 2016-07-12 MED ORDER — METHYLPREDNISOLONE SODIUM SUCC 125 MG IJ SOLR
125.0000 mg | Freq: Once | INTRAMUSCULAR | Status: AC
  Administered 2016-07-12: 125 mg via INTRAVENOUS
  Filled 2016-07-12: qty 2

## 2016-07-12 MED ORDER — IOPAMIDOL (ISOVUE-370) INJECTION 76%
100.0000 mL | Freq: Once | INTRAVENOUS | Status: AC | PRN
  Administered 2016-07-12: 100 mL via INTRAVENOUS

## 2016-07-12 MED ORDER — LORAZEPAM 0.5 MG PO TABS: 0.5000 mg | ORAL_TABLET | Freq: Three times a day (TID) | ORAL | 0 refills | Status: DC | PRN

## 2016-07-12 MED ORDER — IPRATROPIUM-ALBUTEROL 0.5-2.5 (3) MG/3ML IN SOLN
3.0000 mL | Freq: Once | RESPIRATORY_TRACT | Status: AC
  Administered 2016-07-12: 3 mL via RESPIRATORY_TRACT
  Filled 2016-07-12: qty 3

## 2016-07-12 MED ORDER — ALBUTEROL SULFATE (2.5 MG/3ML) 0.083% IN NEBU
2.5000 mg | INHALATION_SOLUTION | Freq: Once | RESPIRATORY_TRACT | Status: AC
  Administered 2016-07-12: 2.5 mg via RESPIRATORY_TRACT
  Filled 2016-07-12: qty 3

## 2016-07-12 NOTE — Discharge Instructions (Signed)
Follow-up with your family doctor in one-two weeks for recheck return if any problems

## 2016-07-12 NOTE — ED Provider Notes (Signed)
[facility] DEPT Provider Note   CSN: [id number] Arrival date & time: 07/12/16  1614     History   Chief Complaint Chief Complaint  Patient presents with  . Shortness of Breath    HPI [name] is a 82 y.o. female.  Patient complains of shortness of breath. No cough no fever no chills minimal wheezing   The history is provided by the patient. No language interpreter was used.  Shortness of Breath  This is a new problem. The problem occurs continuously.The current episode started 6 to 12 hours ago. The problem has not changed since onset.Pertinent negatives include no fever, no headaches, no cough, no chest pain, no abdominal pain and no rash. It is unknown what precipitated the problem.    Past Medical History:  Diagnosis Date  . Abnormal drug screen 06/2015  . Anemia   . Anxiety   . Aortic sclerosis   . Arthritis   . Chronic gastritis 10/2009   Duodenitis on EGD  . CKD (chronic kidney disease) stage 2, GFR 60-89 ml/min   . Collagen vascular disease ([place])   . COPD (chronic obstructive pulmonary disease) ([place])   . Diabetes type 2, controlled ([place])    Diet controlled  . Diverticulosis   . Dry ARMD    Retinal hemorrhage (09/2013) [place]  . Essential hypertension, benign   . GERD (gastroesophageal reflux disease)   . Glaucoma    Dr. Venetia Maxon  . Hiatal hernia 2011   Small  . History of pelvic fracture April 2013  . History of rheumatic fever   . History of skin cancer   . History of TIA (transient ischemic attack)   . Hypothyroidism   . Macular degeneration   . Mixed hyperlipidemia   . Osteoporosis 04/2013   Thoracic compression fracture, on bisphosphonate and cal/vit D  . Paroxysmal atrial fibrillation (HCC)   . Pulmonary nodules    Stable bilateral on CT 01/2010, rec rpt yearly for 2 yrs, pt decided to stop f/u  . Seasonal allergies   . Vertebral compression fracture ([place]) 05/2016   s/p vertebroplasty L2/L3    Patient Active Problem List   Diagnosis Date Noted  . Iron deficiency anemia 01/16/2016  . Compression fracture of vertebrae ([place]) 12/23/2015  . RLQ abdominal pain 12/17/2015  . Right heart failure, NYHA class 3 11/22/2015  . Chronic nonspecific lung disease ([place]) 10/17/2015  . Macrocytosis 08/08/2015  . Soreness of tongue 08/08/2015  . Macular degeneration   . Glaucoma   . Advanced care planning/counseling discussion 04/26/2014  . Skin rash 09/22/2013  . Encounter for therapeutic drug monitoring 08/14/2013  . Depression with anxiety 04/20/2013  . Unspecified family circumstance 04/20/2013  . Medicare annual wellness visit, subsequent 04/03/2012  . Hypothyroidism   . Diet-controlled diabetes mellitus ([place])   . Pulmonary nodules   . Anxiety attack   . GERD (gastroesophageal reflux disease)   . Osteoporosis   . TIA (transient ischemic attack) 05/18/2011  . Fatigue 10/13/2010  . Essential hypertension, benign 05/05/2010  . Constipation 03/14/2010  . MITRAL VALVE DISORDER 02/19/2010  . Hyperlipidemia 01/11/2009  . Persistent atrial fibrillation ([place]) 06/22/2008    Past Surgical History:  Procedure Laterality Date  . BREAST LUMPECTOMY  1983   LEFT, benign  . Carotid US  2011   mild plaque formation  . CATARACT EXTRACTION  2012   RIGHT  . CESAREAN SECTION     (and h/o 6 miscarriages)  . CHOLECYSTECTOMY N/A 02/27/2013   Procedure: LAPAROSCOPIC  CHOLECYSTECTOMY;  Surgeon: Donato Heinz, MD;  Location: [facility];  Service: General;  Laterality: N/A;  . COLONOSCOPY  03/2010   int hemorrhoids, diverticulosis, no need to repeat (Dr. Sydell Axon)  . DEXA  05/2010   T score -4.5 spine, -2.4 femur; does not want to repeat  . DEXA  04/2013   T score 3.9 [facility] spine, 2.6 hip  . DILATION AND CURETTAGE OF UTERUS     x2  . ESOPHAGOGASTRODUODENOSCOPY  10/2009   small HH, multiple antric ulcerations s/p biopsy, mild chronic gastritis/duodenitis  . ESOPHAGOGASTRODUODENOSCOPY  05/26/2012   RMR: small HH, o/w normal.   . IR  GENERIC HISTORICAL  04/16/2016   IR RADIOLOGIST EVAL & MGMT 04/16/2016 [facility]  . IR GENERIC HISTORICAL  06/16/2016   IR VERTEBROPLASTY LUMBAR BX INC UNI/BIL INC/INJECT/IMAGING 06/16/2016 Luanne Bras, MD [facility]  . IR GENERIC HISTORICAL  06/16/2016   IR VERTEBROPLASTY EA ADDL (T&LS) BX INC UNI/BIL INC INJECT/IMAGING 06/16/2016 Luanne Bras, MD [facility]  . LAPAROSCOPIC CHOLECYSTECTOMY  02/2013   Dr. Geroge Baseman  . PATELLA FRACTURE SURGERY  05/2006   LEFT surgically repaired with pins and wire  . TONSILLECTOMY      OB History    No data available       Home Medications    Prior to Admission medications   Medication Sig Start Date End Date Taking? Authorizing Provider  warfarin (COUMADIN) 2.5 MG tablet Take 1 tablet daily except 1 1/2 tablets on Mondays and Thursdays Patient taking differently: Take 3.75 mg by mouth every evening.  04/28/16  Yes Satira Sark, MD  acetaminophen (TYLENOL) 500 MG tablet Take 2 tablets (1,000 mg total) by mouth 3 (three) times daily. 12/23/15   Ria Bush, MD  calcitonin, salmon, (MIACALCIN/FORTICAL) 200 UNIT/ACT nasal spray Place 1 spray into alternate nostrils daily. 02/24/16   Ria Bush, MD  Calcium-Magnesium-Vitamin D 8450090148 MG-MG-UNIT TABS Take 1 tablet by mouth 2 (two) times daily.    Historical Provider, MD  clonazePAM (KLONOPIN) 0.5 MG tablet Take 1 tablet (0.5 mg total) by mouth at bedtime as needed for anxiety. 06/01/16   Ria Bush, MD  enoxaparin (LOVENOX) 80 MG/0.8ML injection Inject 0.8 mLs (80 mg total) into the skin daily. 06/12/16 06/22/16  Satira Sark, MD  felodipine (PLENDIL) 2.5 MG 24 hr tablet TAKE 1 TABLET DAILY 05/25/16   Ria Bush, MD  ferrous sulfate 325 (65 FE) MG tablet Take 325 mg by mouth daily with breakfast.    Historical Provider, MD  furosemide (LASIX) 20 MG tablet Take 2 tablets (40 mg total) by mouth daily as needed. 06/09/16   Ria Bush, MD  latanoprost  (XALATAN) 0.005 % ophthalmic solution Place 1 drop into both eyes at bedtime.     Historical Provider, MD  levothyroxine (SYNTHROID, LEVOTHROID) 50 MCG tablet TAKE 1 TABLET DAILY 06/18/16   Ria Bush, MD  lidocaine (LIDODERM) 5 % Place 1 patch onto the skin daily. Remove & Discard patch within 12 hours or as directed by MD 05/01/16   Ria Bush, MD  LORazepam (ATIVAN) 0.5 MG tablet Take 1 tablet (0.5 mg total) by mouth every 8 (eight) hours as needed for anxiety. 07/12/16   Milton Ferguson, MD  Multiple Vitamin (MULTIVITAMIN) capsule Take 1 capsule by mouth every morning.     Historical Provider, MD  Multiple Vitamins-Minerals (ICAPS AREDS FORMULA PO) Take 1 tablet by mouth daily.     Historical Provider, MD  Omega-3 Fatty Acids (FISH OIL CONCENTRATE) 1000 MG  CAPS Take 2 capsules by mouth 2 (two) times daily.      Historical Provider, MD  oxyCODONE (OXYCONTIN) 10 mg 12 hr tablet Take 1 tablet (10 mg total) by mouth every 12 (twelve) hours. 06/19/16   Ria Bush, MD  polyethylene glycol [facility] / Floria Raveling) packet Take 17 g by mouth daily. 11/15/15   Ria Bush, MD  potassium chloride SA (K-DUR,KLOR-CON) 20 MEQ tablet Take 1 tablet (20 mEq total) by mouth daily as needed. Patient taking differently: Take 20 mEq by mouth daily as needed (along with Lasix).  01/20/16   Ria Bush, MD  SENEXON-S 8.6-50 MG tablet TAKE 1 TABLET BY MOUTH DAILY 06/22/16   Ria Bush, MD  sertraline (ZOLOFT) 50 MG tablet Take 1 tablet (50 mg total) by mouth daily. 02/18/16   Ria Bush, MD  traMADol (ULTRAM) 50 MG tablet Take 0.5-1 tablets (25-50 mg total) by mouth every 8 (eight) hours as needed for moderate pain. 06/19/16   Ria Bush, MD  vitamin B-12 (CYANOCOBALAMIN) 500 MCG tablet Take 500 mcg by mouth daily.    Historical Provider, MD    Family History Family History  Problem Relation Age of Onset  . Coronary artery disease Mother 43    MI deceased  . Colon cancer Father 25    . Arrhythmia Sister   . Stroke Paternal Grandmother   . Diabetes Neg Hx     Social History Social History  Substance Use Topics  . Smoking status: Former Smoker    Packs/day: 1.00    Years: 20.00    Types: Cigarettes    Quit date: 10/13/1998  . Smokeless tobacco: Never Used  . Alcohol use No     Allergies   Propoxyphene n-acetaminophen; Codeine; Meperidine hcl; Morphine; Shellfish allergy; and Tramadol   Review of Systems Review of Systems  Constitutional: Negative for appetite change, fatigue and fever.  HENT: Negative for congestion, ear discharge and sinus pressure.   Eyes: Negative for discharge.  Respiratory: Positive for shortness of breath. Negative for cough.   Cardiovascular: Negative for chest pain.  Gastrointestinal: Negative for abdominal pain and diarrhea.  Genitourinary: Negative for frequency and hematuria.  Musculoskeletal: Negative for back pain.  Skin: Negative for rash.  Neurological: Negative for seizures and headaches.  Psychiatric/Behavioral: Negative for hallucinations.     Physical Exam Updated Vital Signs BP 139/73   Pulse 99   Temp 97.8 F (36.6 C) (Oral)   Resp 16   Ht 5\' 6"  (1.676 m)   Wt 104 lb (47.2 kg)   SpO2 94%   BMI 16.79 kg/m   Physical Exam  Constitutional: She is oriented to person, place, and time. She appears well-developed.  HENT:  Head: Normocephalic.  Eyes: Conjunctivae and EOM are normal. No scleral icterus.  Neck: Neck supple. No thyromegaly present.  Cardiovascular: Normal rate and regular rhythm.  Exam reveals no gallop and no friction rub.   No murmur heard. Pulmonary/Chest: No stridor. She has wheezes. She has no rales. She exhibits no tenderness.  Tachypnea  Abdominal: She exhibits no distension. There is no tenderness. There is no rebound.  Musculoskeletal: Normal range of motion. She exhibits no edema.  Lymphadenopathy:    She has no cervical adenopathy.  Neurological: She is oriented to person,  place, and time. She exhibits normal muscle tone. Coordination normal.  Skin: No rash noted. No erythema.  Psychiatric: She has a normal mood and affect. Her behavior is normal.     ED Treatments / Results  Labs (all labs ordered are listed, but only abnormal results are displayed) Labs Reviewed  CBC WITH DIFFERENTIAL/PLATELET - Abnormal; Notable for the following:       Result Value   MCV 102.2 (*)    MCH 34.3 (*)    All other components within normal limits  COMPREHENSIVE METABOLIC PANEL - Abnormal; Notable for the following:    Potassium 3.2 (*)    Glucose, Bld 110 (*)    All other components within normal limits  BRAIN NATRIURETIC PEPTIDE - Abnormal; Notable for the following:    B Natriuretic Peptide 385.0 (*)    All other components within normal limits  BLOOD GAS, ARTERIAL - Abnormal; Notable for the following:    pH, Arterial 7.669 (*)    pCO2 arterial 19.2 (*)    Allens test (pass/fail) NOT INDICATED (*)    All other components within normal limits  I-STAT TROPOININ, ED    EKG  EKG Interpretation  Date/Time:  Sunday July 12 2016 16:23:46 EST Ventricular Rate:  83 PR Interval:    QRS Duration: 98 QT Interval:  405 QTC Calculation: 476 R Axis:   48 Text Interpretation:  Atrial fibrillation Low voltage, extremity leads Probable anteroseptal infarct, old Artifact in lead(s) I III aVL aVF V2 No clear STEMI. Artifact noted.  Confirmed by LONG MD, JOSHUA [phone]) on 07/12/2016 5:37:36 PM       Radiology Ct Angio Chest Pe W And/or Wo Contrast  Result Date: 07/12/2016 CLINICAL DATA:  Shortness of breath starting few hours ago. EXAM: CT ANGIOGRAPHY CHEST WITH CONTRAST TECHNIQUE: Multidetector CT imaging of the chest was performed using the standard protocol during bolus administration of intravenous contrast. Multiplanar CT image reconstructions and MIPs were obtained to evaluate the vascular anatomy. CONTRAST:  100 cc Isovue 370 intravenously. COMPARISON:  Chest  radiograph 07/12/2016, CT of the chest 06/13/2010 FINDINGS: Cardiovascular: Satisfactory opacification of the pulmonary arteries to the segmental level. No evidence of pulmonary embolism. Enlarged heart. No pericardial effusion. Calcific atherosclerotic disease and tortuosity of the thoracic aorta. Calcific atherosclerotic disease of the coronary arteries. Mediastinum/Nodes: No enlarged mediastinal, hilar, or axillary lymph nodes. Thyroid gland, trachea, and esophagus demonstrate no significant findings. Lungs/Pleura: Moderate upper lobe predominant emphysematous changes. Subpleural left lower lobe soft tissue mass measuring 11 mm. No pneumothorax or pleural effusion. Upper Abdomen: Mild saccular aneurysmal dilation of the proximal abdominal aorta just inferior to the takeoff of the right renal artery. Musculoskeletal: Post kyphoplasty of L2 vertebral body. Compression deformity of L1 vertebral body with approximately 60% height loss. Milder compression deformity of T12, T10, T8 and T7 vertebral bodies. Review of the MIP images confirms the above findings. IMPRESSION: No evidence of pulmonary embolus. Mild focal saccular aneurysmal dilation of the proximal abdominal aorta, just distal to the takeoff of the right renal artery. Enlarged heart. Calcific atherosclerotic disease of the aorta and coronary arteries. 11 mm left lower lobe subpleural soft tissue mass. Consider one of the following in 3 months for both low-risk and high-risk individuals: (a) repeat chest CT, (b) follow-up PET-CT, or (c) tissue sampling. This recommendation follows the consensus statement: Guidelines for Management of Incidental Pulmonary Nodules Detected on CT Images: From the Fleischner Society 2017; Radiology 2017; 284:228-243. Moderate in degree compression deformity of L1 vertebral body, likely representing an age-indeterminate compression fracture, new from comparison CT in 2011, and chest x-ray dated 12/29/2015. Milder compression  deformity of T7, T8, T10 and T12, which may represent grade 1 chronic compression fractures versus degenerative changes, stable.  Electronically Signed   By: Fidela Salisbury M.D.   On: 07/12/2016 19:46   Dg Chest Portable 1 View  Result Date: 07/12/2016 CLINICAL DATA:  Acute shortness of Breath EXAM: PORTABLE CHEST 1 VIEW COMPARISON:  02/22/2016 FINDINGS: Cardiac shadow is stable. Aortic calcifications are again seen. The lungs are well aerated bilaterally. No acute infiltrate or sizable effusion is seen. No bony abnormality is noted. IMPRESSION: No active disease. Electronically Signed   By: [name] M.D.   On: 07/12/2016 17:09    Procedures Procedures (including critical care time)  Medications Ordered in ED Medications  ipratropium-albuterol (DUONEB) 0.5-2.5 (3) MG/3ML nebulizer solution 3 mL (3 mLs Nebulization Given 07/12/16 1645)  albuterol (PROVENTIL) (2.5 MG/3ML) 0.083% nebulizer solution 2.5 mg (2.5 mg Nebulization Given 07/12/16 1645)  methylPREDNISolone sodium succinate (SOLU-MEDROL) 125 mg/2 mL injection 125 mg (125 mg Intravenous Given 07/12/16 1636)  LORazepam (ATIVAN) injection 0.5 mg (0.5 mg Intravenous Given 07/12/16 1824)  iopamidol (ISOVUE-370) 76 % injection 100 mL (100 mLs Intravenous Contrast Given 07/12/16 1846)     Initial Impression / Assessment and Plan / ED Course  I have reviewed the triage vital signs and the nursing notes.  Pertinent labs & imaging results that were available during my care of the patient were reviewed by me and considered in my medical decision making (see chart for details).  Clinical Course     CT scan unremarkable. ABG consistent with hyperventilation. Patient improved with Ativan half milligram IV. She'll be discharged with 0.5 mg Ativan every 8 hours as needed and will follow-up with PCP  Final Clinical Impressions(s) / ED Diagnoses   Final diagnoses:  Hyperventilation    New Prescriptions New Prescriptions   LORAZEPAM  (ATIVAN) 0.5 MG TABLET    Take 1 tablet (0.5 mg total) by mouth every 8 (eight) hours as needed for anxiety.     Milton Ferguson, MD 07/12/16 2003

## 2016-07-12 NOTE — ED Notes (Signed)
CRITICAL VALUE ALERT  Critical value received:  PH = 7.669  PCO2= 19.2 , PO2 = 83.3, bicarb = 27.7, saturation = 97% on room air  Date of notification:  07/12/16  Time of notification:  0811  Critical value read back:Yes.    Nurse who received alert:  Rosealee Albee  MD notified (1st page):  [place]  Time of first page:  1814  MD notified (2nd page):  Time of second page:  Responding MD:  Roderic Palau  Time MD responded:  [phone]

## 2016-07-12 NOTE — ED Triage Notes (Signed)
Pt reports becoming shortness of breath about 4pm today.  States she was fine upon waking.  Denies pain.

## 2016-07-14 ENCOUNTER — Telehealth: Payer: Self-pay

## 2016-07-14 DIAGNOSIS — E119 Type 2 diabetes mellitus without complications: Secondary | ICD-10-CM | POA: Diagnosis not present

## 2016-07-14 DIAGNOSIS — I50810 Right heart failure, unspecified: Secondary | ICD-10-CM | POA: Diagnosis not present

## 2016-07-14 DIAGNOSIS — M48061 Spinal stenosis, lumbar region without neurogenic claudication: Secondary | ICD-10-CM | POA: Diagnosis not present

## 2016-07-14 DIAGNOSIS — I481 Persistent atrial fibrillation: Secondary | ICD-10-CM | POA: Diagnosis not present

## 2016-07-14 DIAGNOSIS — I110 Hypertensive heart disease with heart failure: Secondary | ICD-10-CM | POA: Diagnosis not present

## 2016-07-14 DIAGNOSIS — M8008XG Age-related osteoporosis with current pathological fracture, vertebra(e), subsequent encounter for fracture with delayed healing: Secondary | ICD-10-CM | POA: Diagnosis not present

## 2016-07-14 NOTE — Telephone Encounter (Signed)
Noted. Seen at ER 12/24.

## 2016-07-14 NOTE — Telephone Encounter (Signed)
PLEASE NOTE: All timestamps contained within this report are represented as [place] Standard Time. CONFIDENTIALTY NOTICE: This fax transmission is intended only for the addressee. It contains information that is legally privileged, confidential or otherwise protected from use or disclosure. If you are not the intended recipient, you are strictly prohibited from reviewing, disclosing, copying using or disseminating any of this information or taking any action in reliance on or regarding this information. If you have received this fax in error, please notify us immediately by telephone so that we can arrange for its return to [place]. Phone: [phone], Toll-Free: [phone], Fax: [phone] Page: 1 of 2 Call Id: [id number] [place] Patient Name: [name] Gender: Female DOB: 27-Dec-1925 Age: 82 Y 56 M 7 D Return Phone Number: Address: City/State/Zip:  Client [place] [facility] - Client Client Site [place] - Day Physician Ria Bush - MD Contact Type Call Who Is Calling Patient / Member / Family / Caregiver Call Type Triage / Clinical Caller Name Areeba Cockrell Relationship To Patient Provider Return Phone Number Please choose phone number Chief Complaint Dizziness Reason for Call Symptomatic / Request for [place] from [place] [phone] x [phone] / [phone] says she needs permission to do a PRN on PT. PT's dtr said her blood was low yesterday, and PT is dizzy today. They need to send a nurse to check on her. Caller insisted on speaking with a medical provider today Appointment Disposition EMR Appointment Not Necessary Info pasted into Epic No Translation No No Triage Reason Other Nurse Assessment Nurse: Julien Girt, RN, Almyra Free Date/Time [name] Time): 07/11/2016  10:19:15 AM Confirm and document reason for call. If symptomatic, describe symptoms. ---Caller is Angie from [place] [phone] / [phone] says she needs permission to do a PRN visit on this PT today. Her BP was low on Friday and today she is dizzy. She is not with the pt, hr dtr called and requested a visit. Does the patient have any new or worsening symptoms? ---Yes Will a triage be completed? ---No Select reason for no triage. ---Other Please document clinical information provided and list any resource used. ---Advised that I would contact the on call provider fro her now. She verbalized understanding. Guidelines Guideline Title Affirmed Question Affirmed Notes Nurse Date/Time (Eastern Time) Disp. Time [name] Time) Disposition Final User 07/11/2016 9:58:36 AM Attempt made - message left Chancy Hurter 07/11/2016 10:58:05 AM Called On-Call Provider Julien Girt, RN, Sheilah Mins NOTE: All timestamps contained within this report are represented as [place] Standard Time. CONFIDENTIALTY NOTICE: This fax transmission is intended only for the addressee. It contains information that is legally privileged, confidential or otherwise protected from use or disclosure. If you are not the intended recipient, you are strictly prohibited from reviewing, disclosing, copying using or disseminating any of this information or taking any action in reliance on or regarding this information. If you have received this fax in error, please notify us immediately by telephone so that we can arrange for its return to [place]. Phone: [phone], Toll-Free: [phone], Fax: [phone] Page: 2 of 2 Call Id: [id number] 07/11/2016 11:07:32 AM Clinical Call Yes Julien Girt, RN, Almyra Free Comments User: Eloisa Northern, RN Date/Time [name] Time): 07/11/2016 11:06:21 AM Spoke to Webb Silversmith, information and instructions provided per Dr, Colin Benton. She verbalized understanding of all  instructions  and information and she will cb as needed. Paging DoctorName Phone DateTime Result/Outcome Message Type Notes Colin Benton- MD [id number] 07/11/2016 10:58:05 AM Called On Call Provider - Reached Doctor Paged Colin Benton- MD 07/11/2016 10:59:09 AM Spoke with On Call - General Message Result Information provided, states she can give a VO for a PRN visit today, but the signed order will have to come from hr PCP on Tuesday. If pt is very ill she will have to proceed to an UC.

## 2016-07-15 DIAGNOSIS — M48061 Spinal stenosis, lumbar region without neurogenic claudication: Secondary | ICD-10-CM | POA: Diagnosis not present

## 2016-07-15 DIAGNOSIS — E119 Type 2 diabetes mellitus without complications: Secondary | ICD-10-CM | POA: Diagnosis not present

## 2016-07-15 DIAGNOSIS — M8008XG Age-related osteoporosis with current pathological fracture, vertebra(e), subsequent encounter for fracture with delayed healing: Secondary | ICD-10-CM | POA: Diagnosis not present

## 2016-07-15 DIAGNOSIS — I110 Hypertensive heart disease with heart failure: Secondary | ICD-10-CM | POA: Diagnosis not present

## 2016-07-15 DIAGNOSIS — I481 Persistent atrial fibrillation: Secondary | ICD-10-CM | POA: Diagnosis not present

## 2016-07-15 DIAGNOSIS — I50810 Right heart failure, unspecified: Secondary | ICD-10-CM | POA: Diagnosis not present

## 2016-07-16 ENCOUNTER — Telehealth: Payer: Self-pay

## 2016-07-16 DIAGNOSIS — M48061 Spinal stenosis, lumbar region without neurogenic claudication: Secondary | ICD-10-CM | POA: Diagnosis not present

## 2016-07-16 DIAGNOSIS — I110 Hypertensive heart disease with heart failure: Secondary | ICD-10-CM | POA: Diagnosis not present

## 2016-07-16 DIAGNOSIS — I481 Persistent atrial fibrillation: Secondary | ICD-10-CM | POA: Diagnosis not present

## 2016-07-16 DIAGNOSIS — M8008XG Age-related osteoporosis with current pathological fracture, vertebra(e), subsequent encounter for fracture with delayed healing: Secondary | ICD-10-CM | POA: Diagnosis not present

## 2016-07-16 DIAGNOSIS — I50810 Right heart failure, unspecified: Secondary | ICD-10-CM | POA: Diagnosis not present

## 2016-07-16 DIAGNOSIS — E119 Type 2 diabetes mellitus without complications: Secondary | ICD-10-CM | POA: Diagnosis not present

## 2016-07-16 NOTE — Telephone Encounter (Signed)
V/M was left/ pt was seen in ED on 07/12/16 for panic attack and was given lorazepam. Previously pt had been taking clorazepam. Pt does not know which one Dr Darnell Level would like for pt to continue taking. Needs refill to [facility] in [place] for which ev er med Dr Darnell Level wants pt to take for anxiety. Request cb to pt.

## 2016-07-17 ENCOUNTER — Ambulatory Visit (INDEPENDENT_AMBULATORY_CARE_PROVIDER_SITE_OTHER): Payer: Medicare Other | Admitting: Cardiology

## 2016-07-17 ENCOUNTER — Other Ambulatory Visit (HOSPITAL_COMMUNITY): Payer: Self-pay | Admitting: *Deleted

## 2016-07-17 DIAGNOSIS — I481 Persistent atrial fibrillation: Secondary | ICD-10-CM

## 2016-07-17 DIAGNOSIS — E119 Type 2 diabetes mellitus without complications: Secondary | ICD-10-CM | POA: Diagnosis not present

## 2016-07-17 DIAGNOSIS — M48061 Spinal stenosis, lumbar region without neurogenic claudication: Secondary | ICD-10-CM | POA: Diagnosis not present

## 2016-07-17 DIAGNOSIS — Z5181 Encounter for therapeutic drug level monitoring: Secondary | ICD-10-CM

## 2016-07-17 DIAGNOSIS — I50810 Right heart failure, unspecified: Secondary | ICD-10-CM | POA: Diagnosis not present

## 2016-07-17 DIAGNOSIS — M8008XG Age-related osteoporosis with current pathological fracture, vertebra(e), subsequent encounter for fracture with delayed healing: Secondary | ICD-10-CM | POA: Diagnosis not present

## 2016-07-17 DIAGNOSIS — G458 Other transient cerebral ischemic attacks and related syndromes: Secondary | ICD-10-CM

## 2016-07-17 DIAGNOSIS — I110 Hypertensive heart disease with heart failure: Secondary | ICD-10-CM | POA: Diagnosis not present

## 2016-07-17 DIAGNOSIS — I4819 Other persistent atrial fibrillation: Secondary | ICD-10-CM

## 2016-07-17 LAB — POCT INR: INR: 2.4

## 2016-07-17 MED ORDER — LORAZEPAM 0.5 MG PO TABS: 0.5000 mg | ORAL_TABLET | Freq: Three times a day (TID) | ORAL | 0 refills | Status: AC | PRN

## 2016-07-17 NOTE — Telephone Encounter (Signed)
Ok let's change to lorazepam Stop klonopin. Will start lorazepam 0.5mg  BID PRN. plz phone in.

## 2016-07-17 NOTE — Telephone Encounter (Signed)
Spoke with patient and [name] who was with her. They said they couldn't determine a difference and both seemed to work about the same. She takes one at bedtime and wakes up during the night anxious about things she has forgotten to do during the day and then has to take another in the morning to get her through the day. They will leave it up to you to make the best determination on what you think she needs.

## 2016-07-17 NOTE — Telephone Encounter (Signed)
Ok to use whichever she felt was more effective. How did lorazepam work?

## 2016-07-17 NOTE — Telephone Encounter (Signed)
Rx called in as directed.   

## 2016-07-21 ENCOUNTER — Ambulatory Visit (HOSPITAL_COMMUNITY)
Admission: RE | Admit: 2016-07-21 | Discharge: 2016-07-21 | Disposition: A | Discharge status: Home / Self Care | Payer: Medicare Other | Source: Ambulatory Visit | Attending: Family Medicine | Admitting: Family Medicine

## 2016-07-21 DIAGNOSIS — M810 Age-related osteoporosis without current pathological fracture: Secondary | ICD-10-CM | POA: Insufficient documentation

## 2016-07-21 MED ORDER — IBANDRONATE SODIUM 3 MG/3ML IV SOLN
INTRAVENOUS | Status: AC
  Administered 2016-07-21: 3 mg via INTRAVENOUS
  Filled 2016-07-21: qty 3

## 2016-07-21 MED ORDER — IBANDRONATE SODIUM 3 MG/3ML IV SOLN
3.0000 mg | Freq: Once | INTRAVENOUS | Status: AC
  Administered 2016-07-21: 3 mg via INTRAVENOUS

## 2016-07-22 DIAGNOSIS — M48061 Spinal stenosis, lumbar region without neurogenic claudication: Secondary | ICD-10-CM | POA: Diagnosis not present

## 2016-07-22 DIAGNOSIS — I110 Hypertensive heart disease with heart failure: Secondary | ICD-10-CM | POA: Diagnosis not present

## 2016-07-22 DIAGNOSIS — I50810 Right heart failure, unspecified: Secondary | ICD-10-CM | POA: Diagnosis not present

## 2016-07-22 DIAGNOSIS — E119 Type 2 diabetes mellitus without complications: Secondary | ICD-10-CM | POA: Diagnosis not present

## 2016-07-22 DIAGNOSIS — M8008XG Age-related osteoporosis with current pathological fracture, vertebra(e), subsequent encounter for fracture with delayed healing: Secondary | ICD-10-CM | POA: Diagnosis not present

## 2016-07-22 DIAGNOSIS — I481 Persistent atrial fibrillation: Secondary | ICD-10-CM | POA: Diagnosis not present

## 2016-07-24 DIAGNOSIS — E119 Type 2 diabetes mellitus without complications: Secondary | ICD-10-CM | POA: Diagnosis not present

## 2016-07-24 DIAGNOSIS — I50810 Right heart failure, unspecified: Secondary | ICD-10-CM | POA: Diagnosis not present

## 2016-07-24 DIAGNOSIS — M48061 Spinal stenosis, lumbar region without neurogenic claudication: Secondary | ICD-10-CM | POA: Diagnosis not present

## 2016-07-24 DIAGNOSIS — M8008XG Age-related osteoporosis with current pathological fracture, vertebra(e), subsequent encounter for fracture with delayed healing: Secondary | ICD-10-CM | POA: Diagnosis not present

## 2016-07-24 DIAGNOSIS — I481 Persistent atrial fibrillation: Secondary | ICD-10-CM | POA: Diagnosis not present

## 2016-07-24 DIAGNOSIS — I110 Hypertensive heart disease with heart failure: Secondary | ICD-10-CM | POA: Diagnosis not present

## 2016-07-26 ENCOUNTER — Observation Stay (HOSPITAL_COMMUNITY)
Admission: EM | Admit: 2016-07-26 | Discharge: 2016-07-28 | Disposition: A | Discharge status: Home / Self Care | Payer: Medicare Other | Attending: Nephrology | Admitting: Nephrology

## 2016-07-26 ENCOUNTER — Emergency Department (HOSPITAL_COMMUNITY): Payer: Medicare Other

## 2016-07-26 ENCOUNTER — Encounter (HOSPITAL_COMMUNITY): Payer: Self-pay

## 2016-07-26 DIAGNOSIS — M1990 Unspecified osteoarthritis, unspecified site: Secondary | ICD-10-CM | POA: Insufficient documentation

## 2016-07-26 DIAGNOSIS — Z85828 Personal history of other malignant neoplasm of skin: Secondary | ICD-10-CM | POA: Insufficient documentation

## 2016-07-26 DIAGNOSIS — B349 Viral infection, unspecified: Secondary | ICD-10-CM | POA: Diagnosis not present

## 2016-07-26 DIAGNOSIS — F419 Anxiety disorder, unspecified: Secondary | ICD-10-CM | POA: Diagnosis not present

## 2016-07-26 DIAGNOSIS — E119 Type 2 diabetes mellitus without complications: Secondary | ICD-10-CM | POA: Diagnosis not present

## 2016-07-26 DIAGNOSIS — M810 Age-related osteoporosis without current pathological fracture: Secondary | ICD-10-CM | POA: Diagnosis not present

## 2016-07-26 DIAGNOSIS — I130 Hypertensive heart and chronic kidney disease with heart failure and stage 1 through stage 4 chronic kidney disease, or unspecified chronic kidney disease: Secondary | ICD-10-CM | POA: Insufficient documentation

## 2016-07-26 DIAGNOSIS — Z66 Do not resuscitate: Secondary | ICD-10-CM | POA: Diagnosis not present

## 2016-07-26 DIAGNOSIS — G894 Chronic pain syndrome: Secondary | ICD-10-CM | POA: Insufficient documentation

## 2016-07-26 DIAGNOSIS — I50810 Right heart failure, unspecified: Secondary | ICD-10-CM | POA: Diagnosis not present

## 2016-07-26 DIAGNOSIS — K219 Gastro-esophageal reflux disease without esophagitis: Secondary | ICD-10-CM | POA: Diagnosis not present

## 2016-07-26 DIAGNOSIS — E782 Mixed hyperlipidemia: Secondary | ICD-10-CM | POA: Insufficient documentation

## 2016-07-26 DIAGNOSIS — I480 Paroxysmal atrial fibrillation: Secondary | ICD-10-CM | POA: Insufficient documentation

## 2016-07-26 DIAGNOSIS — I959 Hypotension, unspecified: Secondary | ICD-10-CM | POA: Insufficient documentation

## 2016-07-26 DIAGNOSIS — J449 Chronic obstructive pulmonary disease, unspecified: Secondary | ICD-10-CM | POA: Diagnosis not present

## 2016-07-26 DIAGNOSIS — R531 Weakness: Secondary | ICD-10-CM | POA: Diagnosis not present

## 2016-07-26 DIAGNOSIS — E039 Hypothyroidism, unspecified: Secondary | ICD-10-CM | POA: Diagnosis not present

## 2016-07-26 DIAGNOSIS — I4819 Other persistent atrial fibrillation: Secondary | ICD-10-CM | POA: Diagnosis present

## 2016-07-26 DIAGNOSIS — Z8719 Personal history of other diseases of the digestive system: Secondary | ICD-10-CM | POA: Insufficient documentation

## 2016-07-26 DIAGNOSIS — Z8673 Personal history of transient ischemic attack (TIA), and cerebral infarction without residual deficits: Secondary | ICD-10-CM | POA: Insufficient documentation

## 2016-07-26 DIAGNOSIS — M791 Myalgia: Secondary | ICD-10-CM | POA: Diagnosis not present

## 2016-07-26 DIAGNOSIS — E876 Hypokalemia: Secondary | ICD-10-CM | POA: Diagnosis not present

## 2016-07-26 DIAGNOSIS — F411 Generalized anxiety disorder: Secondary | ICD-10-CM | POA: Diagnosis not present

## 2016-07-26 DIAGNOSIS — M358 Other specified systemic involvement of connective tissue: Secondary | ICD-10-CM | POA: Insufficient documentation

## 2016-07-26 DIAGNOSIS — E785 Hyperlipidemia, unspecified: Secondary | ICD-10-CM | POA: Diagnosis present

## 2016-07-26 DIAGNOSIS — Z7901 Long term (current) use of anticoagulants: Secondary | ICD-10-CM | POA: Insufficient documentation

## 2016-07-26 DIAGNOSIS — I481 Persistent atrial fibrillation: Secondary | ICD-10-CM | POA: Diagnosis not present

## 2016-07-26 DIAGNOSIS — M6281 Muscle weakness (generalized): Secondary | ICD-10-CM

## 2016-07-26 DIAGNOSIS — Z91013 Allergy to seafood: Secondary | ICD-10-CM | POA: Insufficient documentation

## 2016-07-26 DIAGNOSIS — D7589 Other specified diseases of blood and blood-forming organs: Secondary | ICD-10-CM | POA: Diagnosis not present

## 2016-07-26 DIAGNOSIS — Z87891 Personal history of nicotine dependence: Secondary | ICD-10-CM | POA: Insufficient documentation

## 2016-07-26 DIAGNOSIS — I482 Chronic atrial fibrillation: Secondary | ICD-10-CM | POA: Insufficient documentation

## 2016-07-26 DIAGNOSIS — Z888 Allergy status to other drugs, medicaments and biological substances status: Secondary | ICD-10-CM | POA: Insufficient documentation

## 2016-07-26 DIAGNOSIS — Z885 Allergy status to narcotic agent status: Secondary | ICD-10-CM | POA: Insufficient documentation

## 2016-07-26 DIAGNOSIS — F329 Major depressive disorder, single episode, unspecified: Secondary | ICD-10-CM | POA: Diagnosis not present

## 2016-07-26 DIAGNOSIS — H353190 Nonexudative age-related macular degeneration, unspecified eye, stage unspecified: Secondary | ICD-10-CM | POA: Diagnosis not present

## 2016-07-26 LAB — COMPREHENSIVE METABOLIC PANEL
ALBUMIN: 3.8 g/dL (ref 3.5–5.0)
ALK PHOS: 77 U/L (ref 38–126)
ALT: 40 U/L (ref 14–54)
AST: 33 U/L (ref 15–41)
Anion gap: 8 (ref 5–15)
BILIRUBIN TOTAL: 1.1 mg/dL (ref 0.3–1.2)
BUN: 14 mg/dL (ref 6–20)
CALCIUM: 9.6 mg/dL (ref 8.9–10.3)
CO2: 29 mmol/L (ref 22–32)
CREATININE: 0.51 mg/dL (ref 0.44–1.00)
Chloride: 101 mmol/L (ref 101–111)
GFR calc Af Amer: >60 mL/min (ref >60)
GLUCOSE: 103 mg/dL — AB (ref 65–99)
POTASSIUM: 2.8 mmol/L — AB (ref 3.5–5.1)
Sodium: 138 mmol/L (ref 135–145)
TOTAL PROTEIN: 7.2 g/dL (ref 6.5–8.1)

## 2016-07-26 LAB — URINALYSIS, ROUTINE W REFLEX MICROSCOPIC
BILIRUBIN URINE: NEGATIVE
Glucose, UA: NEGATIVE mg/dL
Hgb urine dipstick: NEGATIVE
Ketones, ur: NEGATIVE mg/dL
Leukocytes, UA: NEGATIVE
NITRITE: NEGATIVE
PH: 8 (ref 5.0–8.0)
Protein, ur: NEGATIVE mg/dL
SPECIFIC GRAVITY, URINE: 1.004 — AB (ref 1.005–1.030)

## 2016-07-26 LAB — CBC WITH DIFFERENTIAL/PLATELET
Basophils Absolute: 0 10*3/uL (ref 0.0–0.1)
Basophils Relative: 0 %
Eosinophils Absolute: 0.1 10*3/uL (ref 0.0–0.7)
Eosinophils Relative: 1 %
HEMATOCRIT: 41.2 % (ref 36.0–46.0)
HEMOGLOBIN: 13.9 g/dL (ref 12.0–15.0)
LYMPHS ABS: 2.8 10*3/uL (ref 0.7–4.0)
LYMPHS PCT: 37 %
MCH: 35.2 pg — AB (ref 26.0–34.0)
MCHC: 33.7 g/dL (ref 30.0–36.0)
MCV: 104.3 fL — AB (ref 78.0–100.0)
MONOS PCT: 17 %
Monocytes Absolute: 1.3 10*3/uL — ABNORMAL HIGH (ref 0.1–1.0)
NEUTROS PCT: 45 %
Neutro Abs: 3.2 10*3/uL (ref 1.7–7.7)
Platelets: 301 10*3/uL (ref 150–400)
RBC: 3.95 MIL/uL (ref 3.87–5.11)
RDW: 15.1 % (ref 11.5–15.5)
WBC: 7.4 10*3/uL (ref 4.0–10.5)

## 2016-07-26 LAB — INFLUENZA PANEL BY PCR (TYPE A & B)
INFLAPCR: NEGATIVE
Influenza B By PCR: NEGATIVE

## 2016-07-26 LAB — TROPONIN I: Troponin I: 0.03 ng/mL (ref <0.03)

## 2016-07-26 MED ORDER — POTASSIUM CHLORIDE 10 MEQ/100ML IV SOLN
10.0000 meq | Freq: Once | INTRAVENOUS | Status: AC
  Administered 2016-07-26: 10 meq via INTRAVENOUS
  Filled 2016-07-26: qty 100

## 2016-07-26 MED ORDER — HYDROCODONE-ACETAMINOPHEN 5-325 MG PO TABS
1.0000 | ORAL_TABLET | Freq: Once | ORAL | Status: AC
  Administered 2016-07-26: 1 via ORAL
  Filled 2016-07-26: qty 1

## 2016-07-26 MED ORDER — POTASSIUM CHLORIDE CRYS ER 20 MEQ PO TBCR
40.0000 meq | EXTENDED_RELEASE_TABLET | Freq: Once | ORAL | Status: AC
  Administered 2016-07-26: 40 meq via ORAL
  Filled 2016-07-26: qty 2

## 2016-07-26 NOTE — ED Triage Notes (Signed)
Patient states that she is feeling nauseated.

## 2016-07-26 NOTE — ED Notes (Signed)
CRITICAL VALUE ALERT  Critical value received:  Troponin 0.03  Date of notification:  07/26/16  Time of notification:  [id number]  Critical value read back:Yes.    Nurse who received alert:  Derek Mound  MD notified (1st page):  zammit  Time of first page:  2254  MD notified (2nd page):  Time of second page:  Responding MD:  zammit  Time MD responded:  2254

## 2016-07-26 NOTE — ED Notes (Signed)
Pt ambulated to restroom & returned to room w/ no complications. 

## 2016-07-26 NOTE — ED Triage Notes (Signed)
Started aching all over in my joints per pt.  Increased weakness.  Denies chest pain or increased shortness of breath.  States that she does not know if she has ran a fever since she broke her thermometer.  Having an increase in thirst.  Also complaining of a headache.  Also has a history of a-fib.  Patient states that her breathing has been easy today.

## 2016-07-26 NOTE — ED Notes (Signed)
Pt states feeling bad, pain in her joints. Denies running fever or having chills.

## 2016-07-27 ENCOUNTER — Encounter (HOSPITAL_COMMUNITY): Payer: Self-pay | Admitting: *Deleted

## 2016-07-27 DIAGNOSIS — B349 Viral infection, unspecified: Secondary | ICD-10-CM | POA: Diagnosis present

## 2016-07-27 DIAGNOSIS — G933 Postviral fatigue syndrome: Secondary | ICD-10-CM | POA: Diagnosis not present

## 2016-07-27 DIAGNOSIS — E876 Hypokalemia: Secondary | ICD-10-CM

## 2016-07-27 DIAGNOSIS — R531 Weakness: Secondary | ICD-10-CM | POA: Diagnosis not present

## 2016-07-27 DIAGNOSIS — E038 Other specified hypothyroidism: Secondary | ICD-10-CM | POA: Diagnosis not present

## 2016-07-27 DIAGNOSIS — I481 Persistent atrial fibrillation: Secondary | ICD-10-CM

## 2016-07-27 DIAGNOSIS — Z66 Do not resuscitate: Secondary | ICD-10-CM

## 2016-07-27 LAB — TSH: TSH: 2.75 u[IU]/mL (ref 0.350–4.500)

## 2016-07-27 LAB — PROTIME-INR
INR: 2.09
Prothrombin Time: 23.8 seconds — ABNORMAL HIGH (ref 11.4–15.2)

## 2016-07-27 LAB — MAGNESIUM: Magnesium: 1.9 mg/dL (ref 1.7–2.4)

## 2016-07-27 LAB — BASIC METABOLIC PANEL
ANION GAP: 6 (ref 5–15)
BUN: 12 mg/dL (ref 6–20)
CHLORIDE: 104 mmol/L (ref 101–111)
CO2: 30 mmol/L (ref 22–32)
Calcium: 8.3 mg/dL — ABNORMAL LOW (ref 8.9–10.3)
Creatinine, Ser: 0.51 mg/dL (ref 0.44–1.00)
GFR calc Af Amer: >60 mL/min (ref >60)
GLUCOSE: 122 mg/dL — AB (ref 65–99)
POTASSIUM: 3.3 mmol/L — AB (ref 3.5–5.1)
Sodium: 140 mmol/L (ref 135–145)

## 2016-07-27 LAB — MRSA PCR SCREENING: MRSA by PCR: NEGATIVE

## 2016-07-27 LAB — BRAIN NATRIURETIC PEPTIDE: B Natriuretic Peptide: 364 pg/mL — ABNORMAL HIGH (ref 0.0–100.0)

## 2016-07-27 MED ORDER — SERTRALINE HCL 50 MG PO TABS
50.0000 mg | ORAL_TABLET | Freq: Every day | ORAL | Status: DC
  Administered 2016-07-27 – 2016-07-28 (×2): 50 mg via ORAL
  Filled 2016-07-27 (×2): qty 1

## 2016-07-27 MED ORDER — LATANOPROST 0.005 % OP SOLN
1.0000 [drp] | Freq: Every day | OPHTHALMIC | Status: DC
  Administered 2016-07-27: 1 [drp] via OPHTHALMIC
  Filled 2016-07-27: qty 2.5

## 2016-07-27 MED ORDER — POTASSIUM CHLORIDE CRYS ER 20 MEQ PO TBCR
40.0000 meq | EXTENDED_RELEASE_TABLET | Freq: Every day | ORAL | Status: DC
  Administered 2016-07-27: 40 meq via ORAL
  Filled 2016-07-27: qty 2

## 2016-07-27 MED ORDER — KETOROLAC TROMETHAMINE 30 MG/ML IJ SOLN
15.0000 mg | Freq: Four times a day (QID) | INTRAMUSCULAR | Status: DC | PRN
  Administered 2016-07-27 (×2): 15 mg via INTRAVENOUS
  Filled 2016-07-27 (×2): qty 1

## 2016-07-27 MED ORDER — LORAZEPAM 0.5 MG PO TABS: 0.5000 mg | ORAL_TABLET | Freq: Three times a day (TID) | ORAL | Status: DC | PRN

## 2016-07-27 MED ORDER — POLYETHYLENE GLYCOL 3350 17 G PO PACK
17.0000 g | PACK | Freq: Every day | ORAL | Status: DC
  Administered 2016-07-27 – 2016-07-28 (×2): 17 g via ORAL
  Filled 2016-07-27 (×2): qty 1

## 2016-07-27 MED ORDER — SODIUM CHLORIDE 0.9% FLUSH
3.0000 mL | Freq: Two times a day (BID) | INTRAVENOUS | Status: DC
  Administered 2016-07-27 (×3): 3 mL via INTRAVENOUS

## 2016-07-27 MED ORDER — FOLIC ACID 1 MG PO TABS
1.0000 mg | ORAL_TABLET | Freq: Every day | ORAL | Status: DC
  Administered 2016-07-27 – 2016-07-28 (×2): 1 mg via ORAL
  Filled 2016-07-27 (×2): qty 1

## 2016-07-27 MED ORDER — POTASSIUM CHLORIDE CRYS ER 20 MEQ PO TBCR
40.0000 meq | EXTENDED_RELEASE_TABLET | Freq: Two times a day (BID) | ORAL | Status: DC
  Administered 2016-07-27 – 2016-07-28 (×2): 40 meq via ORAL
  Filled 2016-07-27 (×2): qty 2

## 2016-07-27 MED ORDER — WARFARIN - PHARMACIST DOSING INPATIENT: Status: DC

## 2016-07-27 MED ORDER — POTASSIUM CL IN DEXTROSE 5% 20 MEQ/L IV SOLN
20.0000 meq | INTRAVENOUS | Status: DC
  Administered 2016-07-27: 20 meq via INTRAVENOUS
  Filled 2016-07-27: qty 1000

## 2016-07-27 MED ORDER — OMEGA-3-ACID ETHYL ESTERS 1 G PO CAPS
1.0000 g | ORAL_CAPSULE | Freq: Every day | ORAL | Status: DC
  Administered 2016-07-27 – 2016-07-28 (×2): 1 g via ORAL
  Filled 2016-07-27 (×2): qty 1

## 2016-07-27 MED ORDER — LEVOTHYROXINE SODIUM 50 MCG PO TABS
50.0000 ug | ORAL_TABLET | Freq: Every day | ORAL | Status: DC
  Administered 2016-07-27 – 2016-07-28 (×2): 50 ug via ORAL
  Filled 2016-07-27: qty 2
  Filled 2016-07-27: qty 1

## 2016-07-27 MED ORDER — WARFARIN SODIUM 7.5 MG PO TABS
3.7500 mg | ORAL_TABLET | Freq: Once | ORAL | Status: AC
  Administered 2016-07-27: 3.75 mg via ORAL
  Filled 2016-07-27: qty 1

## 2016-07-27 MED ORDER — POTASSIUM CL IN DEXTROSE 5% 20 MEQ/L IV SOLN
20.0000 meq | INTRAVENOUS | Status: DC
  Administered 2016-07-27: 20 meq via INTRAVENOUS
  Filled 2016-07-27 (×2): qty 1000

## 2016-07-27 MED ORDER — POTASSIUM CHLORIDE IN NACL 40-0.9 MEQ/L-% IV SOLN: INTRAVENOUS | Status: DC

## 2016-07-27 MED ORDER — FELODIPINE ER 2.5 MG PO TB24
2.5000 mg | ORAL_TABLET | Freq: Every day | ORAL | Status: DC
  Administered 2016-07-27 – 2016-07-28 (×2): 2.5 mg via ORAL
  Filled 2016-07-27 (×3): qty 1

## 2016-07-27 MED ORDER — CYANOCOBALAMIN 1000 MCG/ML IJ SOLN
1000.0000 ug | Freq: Once | INTRAMUSCULAR | Status: AC
  Administered 2016-07-27: 1000 ug via INTRAMUSCULAR
  Filled 2016-07-27: qty 1

## 2016-07-27 NOTE — Progress Notes (Deleted)
Cardiology Office Note  Date: 07/27/2016   ID: [name], DOB [birth year], MRN [id number]  PCP: Ria Bush, MD  Primary Cardiologist: Rozann Lesches, MD   No chief complaint on file.   History of Present Illness: [name] is an 83 y.o. female last seen in May 2017.  She continues to follow in the anticoagulation clinic on Coumadin.  Past Medical History:  Diagnosis Date  . Abnormal drug screen 06/2015  . Anemia   . Anxiety   . Aortic sclerosis   . Arthritis   . Chronic gastritis 10/2009   Duodenitis on EGD  . CKD (chronic kidney disease) stage 2, GFR 60-89 ml/min   . Collagen vascular disease ([place])   . COPD (chronic obstructive pulmonary disease) ([place])   . Diabetes type 2, controlled ([place])    Diet controlled  . Diverticulosis   . Dry ARMD    Retinal hemorrhage (09/2013) [place]  . Essential hypertension, benign   . GERD (gastroesophageal reflux disease)   . Glaucoma    Dr. Venetia Maxon  . Hiatal hernia 2011   Small  . History of pelvic fracture April 2013  . History of rheumatic fever   . History of skin cancer   . History of TIA (transient ischemic attack)   . Hypothyroidism   . Macular degeneration   . Mixed hyperlipidemia   . Osteoporosis 04/2013   Thoracic compression fracture, on bisphosphonate and cal/vit D  . Paroxysmal atrial fibrillation (HCC)   . Pulmonary nodules    Stable bilateral on CT 01/2010, rec rpt yearly for 2 yrs, pt decided to stop f/u  . Seasonal allergies   . Vertebral compression fracture ([place]) 05/2016   s/p vertebroplasty L2/L3    Past Surgical History:  Procedure Laterality Date  . BREAST LUMPECTOMY  1983   LEFT, benign  . Carotid US  2011   mild plaque formation  . CATARACT EXTRACTION  2012   RIGHT  . CESAREAN SECTION     (and h/o 6 miscarriages)  . CHOLECYSTECTOMY N/A 02/27/2013   Procedure: LAPAROSCOPIC CHOLECYSTECTOMY;  Surgeon: Donato Heinz, MD;  Location: [facility];  Service: General;  Laterality: N/A;    . COLONOSCOPY  03/2010   int hemorrhoids, diverticulosis, no need to repeat (Dr. Sydell Axon)  . DEXA  05/2010   T score -4.5 spine, -2.4 femur; does not want to repeat  . DEXA  04/2013   T score 3.9 AP spine, 2.6 hip  . DILATION AND CURETTAGE OF UTERUS     x2  . ESOPHAGOGASTRODUODENOSCOPY  10/2009   small HH, multiple antric ulcerations s/p biopsy, mild chronic gastritis/duodenitis  . ESOPHAGOGASTRODUODENOSCOPY  05/26/2012   RMR: small HH, o/w normal.   . IR GENERIC HISTORICAL  04/16/2016   IR RADIOLOGIST EVAL & MGMT 04/16/2016 [facility]  . IR GENERIC HISTORICAL  06/16/2016   IR VERTEBROPLASTY LUMBAR BX INC UNI/BIL INC/INJECT/IMAGING 06/16/2016 Luanne Bras, MD [facility]  . IR GENERIC HISTORICAL  06/16/2016   IR VERTEBROPLASTY EA ADDL (T&LS) BX INC UNI/BIL INC INJECT/IMAGING 06/16/2016 Luanne Bras, MD [facility]  . LAPAROSCOPIC CHOLECYSTECTOMY  02/2013   Dr. Geroge Baseman  . PATELLA FRACTURE SURGERY  05/2006   LEFT surgically repaired with pins and wire  . TONSILLECTOMY      No current facility-administered medications for this visit.    No current outpatient prescriptions on file.   Facility-Administered Medications Ordered in Other Visits  Medication Dose Route Frequency Provider Last Rate Last Dose  .  felodipine (PLENDIL) 24 hr tablet 2.5 mg  2.5 mg Oral Daily Orvan Falconer, MD   2.5 mg at 07/27/16 1017  . folic acid (FOLVITE) tablet 1 mg  1 mg Oral Daily Orvan Falconer, MD   1 mg at 07/27/16 1017  . ketorolac (TORADOL) 30 MG/ML injection 15 mg  15 mg Intravenous Q6H PRN Orvan Falconer, MD      . latanoprost (XALATAN) 0.005 % ophthalmic solution 1 drop  1 drop Both Eyes QHS Orvan Falconer, MD      . levothyroxine (SYNTHROID, LEVOTHROID) tablet 50 mcg  50 mcg Oral QAC breakfast Orvan Falconer, MD   50 mcg at 07/27/16 0759  . LORazepam (ATIVAN) tablet 0.5 mg  0.5 mg Oral Q8H PRN Orvan Falconer, MD      . omega-3 acid ethyl esters (LOVAZA) capsule 1 g  1 g Oral Daily Orvan Falconer, MD   1 g at 07/27/16 1018  .  polyethylene glycol (MIRALAX / GLYCOLAX) packet 17 g  17 g Oral Daily Orvan Falconer, MD   17 g at 07/27/16 1021  . potassium chloride SA (K-DUR,KLOR-CON) CR tablet 40 mEq  40 mEq Oral BID Dron Tanna Furry, MD      . sertraline (ZOLOFT) tablet 50 mg  50 mg Oral Daily Orvan Falconer, MD   50 mg at 07/27/16 1017  . sodium chloride flush (NS) 0.9 % injection 3 mL  3 mL Intravenous Q12H Orvan Falconer, MD   3 mL at 07/27/16 1018  . warfarin (COUMADIN) tablet 3.75 mg  3.75 mg Oral Once Dron Tanna Furry, MD      . Derrill Memo ON 07/28/2016] Warfarin - Pharmacist Dosing Inpatient   Does not apply Q24H Dron Tanna Furry, MD       Allergies:  Propoxyphene n-acetaminophen; Codeine; Meperidine hcl; Morphine; Shellfish allergy; and Tramadol   Social History: The patient  reports that she quit smoking about 17 years ago. Her smoking use included Cigarettes. She has a 20.00 pack-year smoking history. She has never used smokeless tobacco. She reports that she does not drink alcohol or use drugs.   Family History: The patient's family history includes Arrhythmia in her sister; Colon cancer (age of onset: 71) in her father; Coronary artery disease (age of onset: 67) in her mother; Stroke in her paternal grandmother.   ROS:  Please see the history of present illness. Otherwise, complete review of systems is positive for {NONE DEFAULTED:18576::"none"}.  All other systems are reviewed and negative.   Physical Exam: VS:  There were no vitals taken for this visit., BMI There is no height or weight on file to calculate BMI.  Wt Readings from Last 3 Encounters:  07/27/16 102 lb 1.2 oz (46.3 kg)  07/21/16 104 lb (47.2 kg)  07/12/16 104 lb (47.2 kg)    General: Thin elderly woman, no distress. Using a cane. HEENT: Conjunctiva and lids normal, oropharynx clear. Neck: Supple, no elevated JVP or carotid bruits, no thyromegaly. Lungs: Clear to auscultation, nonlabored breathing at rest. Cardiac: Irregularly irregular, no S3, soft  systolic murmur, no pericardial rub. Abdomen: Soft, nontender, bowel sounds present. Extremities: No pitting edema, distal pulses 2+. Skin: Warm and dry. Musculoskeletal: Kyphosis noted. Neuropsychiatric: Alert and oriented x3, affect grossly appropriate.  ECG: I personally reviewed the tracing from 07/26/2016 which showed rate-controlled atrial fibrillation with poor R-wave progression.  Recent Labwork: 07/26/2016: ALT 40; AST 33; B Natriuretic Peptide 364.0; Hemoglobin 13.9; Magnesium 1.9; Platelets 301 07/27/2016: BUN 12; Creatinine, Ser 0.51; Potassium 3.3; Sodium  140; TSH 2.750     Component Value Date/Time   CHOL 174 08/01/2015 0845   TRIG 76.0 08/01/2015 0845   TRIG 91 04/14/2011   HDL 49.00 08/01/2015 0845   CHOLHDL 4 08/01/2015 0845   VLDL 15.2 08/01/2015 0845   LDLCALC 109 (H) 08/01/2015 0845   LDLDIRECT 136.3 04/12/2013 0930    Other Studies Reviewed Today:  Echocardiogram 11/29/2015: Study Conclusions  - Left ventricle: The cavity size was normal. Wall thickness was   increased in a pattern of mild LVH. Systolic function was normal.   The estimated ejection fraction was in the range of 55% to 60%.   Wall motion was normal; there were no regional wall motion   abnormalities. - Aortic valve: Moderately calcified annulus. Trileaflet;   moderately thickened leaflets. There was mild regurgitation.   Valve area (VTI): 2.52 cm^2. Valve area (Vmax): 2.37 cm^2. - Mitral valve: Mildly to moderately calcified annulus. Mildly   thickened leaflets . There was mild regurgitation. - Left atrium: The atrium was severely dilated. - Right ventricle: The cavity size was moderately dilated. Systolic   function was mildly to moderately reduced. - Right atrium: The atrium was severely dilated. - Atrial septum: There was a patent foramen ovale. There is   evidence of left to right shunt by color Doppler. - Tricuspid valve: There was mild-moderate regurgitation. - Pulmonary arteries:  Systolic pressure was moderately to severely   increased. PA peak pressure: 68 mm Hg (S). - Inferior vena cava: The vessel was dilated. The respirophasic   diameter changes were blunted (< 50%), consistent with elevated   central venous pressure. - Technically adequate study.  Assessment and Plan:   Current medicines were reviewed with the patient today.  No orders of the defined types were placed in this encounter.   Disposition:  Signed, Satira Sark, MD, [facility] 07/27/2016 1:03 PM    [place] [facility] [address] [place], [place] [address] Phone: [phone]; Fax: [phone]

## 2016-07-27 NOTE — ED Provider Notes (Signed)
[facility] DEPT Provider Note   CSN: [id number] Arrival date & time: 07/26/16  2135     History   Chief Complaint Chief Complaint  Patient presents with  . Fatigue    HPI [name] is a 83 y.o. female.  HPI   [name] is a 83 y.o. female who presents to the Emergency Department complaining of general fatigue, weakness and joint pains.  Symptoms began several days ago although she admits to increasing weakness for approximately 10 weeks.  [name] states she is easily fatigued and lies on the sofa most of the day.  She also complains of increased thirst and nausea.  Denies chest or abdominal pain.  No dysuria.  Has hx of shortness of breath, but reports that Valerie breathing has been "easier than usual" today.  She is unsure of fever, denies chills, vomiting, and increased swelling of the LE's.    Past Medical History:  Diagnosis Date  . Abnormal drug screen 06/2015  . Anemia   . Anxiety   . Aortic sclerosis   . Arthritis   . Chronic gastritis 10/2009   Duodenitis on EGD  . CKD (chronic kidney disease) stage 2, GFR 60-89 ml/min   . Collagen vascular disease ([place])   . COPD (chronic obstructive pulmonary disease) ([place])   . Diabetes type 2, controlled ([place])    Diet controlled  . Diverticulosis   . Dry ARMD    Retinal hemorrhage (09/2013) [place]  . Essential hypertension, benign   . GERD (gastroesophageal reflux disease)   . Glaucoma    Dr. Venetia Maxon  . Hiatal hernia 2011   Small  . History of pelvic fracture April 2013  . History of rheumatic fever   . History of skin cancer   . History of TIA (transient ischemic attack)   . Hypothyroidism   . Macular degeneration   . Mixed hyperlipidemia   . Osteoporosis 04/2013   Thoracic compression fracture, on bisphosphonate and cal/vit D  . Paroxysmal atrial fibrillation (HCC)   . Pulmonary nodules    Stable bilateral on CT 01/2010, rec rpt yearly for 2 yrs, pt decided to stop f/u  . Seasonal allergies   .  Vertebral compression fracture ([place]) 05/2016   s/p vertebroplasty L2/L3    Patient Active Problem List   Diagnosis Date Noted  . Iron deficiency anemia 01/16/2016  . Compression fracture of vertebrae ([place]) 12/23/2015  . RLQ abdominal pain 12/17/2015  . Right heart failure, NYHA class 3 11/22/2015  . Chronic nonspecific lung disease ([place]) 10/17/2015  . Macrocytosis 08/08/2015  . Soreness of tongue 08/08/2015  . Macular degeneration   . Glaucoma   . Advanced care planning/counseling discussion 04/26/2014  . Skin rash 09/22/2013  . Encounter for therapeutic drug monitoring 08/14/2013  . Depression with anxiety 04/20/2013  . Unspecified family circumstance 04/20/2013  . Medicare annual wellness visit, subsequent 04/03/2012  . Hypothyroidism   . Diet-controlled diabetes mellitus ([place])   . Pulmonary nodules   . Anxiety attack   . GERD (gastroesophageal reflux disease)   . Osteoporosis   . TIA (transient ischemic attack) 05/18/2011  . Fatigue 10/13/2010  . Essential hypertension, benign 05/05/2010  . Constipation 03/14/2010  . MITRAL VALVE DISORDER 02/19/2010  . Hyperlipidemia 01/11/2009  . Persistent atrial fibrillation ([place]) 06/22/2008    Past Surgical History:  Procedure Laterality Date  . BREAST LUMPECTOMY  1983   LEFT, benign  . Carotid US  2011   mild plaque formation  . CATARACT  EXTRACTION  2012   RIGHT  . CESAREAN SECTION     (and h/o 6 miscarriages)  . CHOLECYSTECTOMY N/A 02/27/2013   Procedure: LAPAROSCOPIC CHOLECYSTECTOMY;  Surgeon: Donato Heinz, MD;  Location: [facility];  Service: General;  Laterality: N/A;  . COLONOSCOPY  03/2010   int hemorrhoids, diverticulosis, no need to repeat (Dr. Sydell Axon)  . DEXA  05/2010   T score -4.5 spine, -2.4 femur; does not want to repeat  . DEXA  04/2013   T score 3.9 AP spine, 2.6 hip  . DILATION AND CURETTAGE OF UTERUS     x2  . ESOPHAGOGASTRODUODENOSCOPY  10/2009   small HH, multiple antric ulcerations s/p biopsy, mild  chronic gastritis/duodenitis  . ESOPHAGOGASTRODUODENOSCOPY  05/26/2012   RMR: small HH, o/w normal.   . IR GENERIC HISTORICAL  04/16/2016   IR RADIOLOGIST EVAL & MGMT 04/16/2016 [facility]  . IR GENERIC HISTORICAL  06/16/2016   IR VERTEBROPLASTY LUMBAR BX INC UNI/BIL INC/INJECT/IMAGING 06/16/2016 Luanne Bras, MD [facility]  . IR GENERIC HISTORICAL  06/16/2016   IR VERTEBROPLASTY EA ADDL (T&LS) BX INC UNI/BIL INC INJECT/IMAGING 06/16/2016 Luanne Bras, MD [facility]  . LAPAROSCOPIC CHOLECYSTECTOMY  02/2013   Dr. Geroge Baseman  . PATELLA FRACTURE SURGERY  05/2006   LEFT surgically repaired with pins and wire  . TONSILLECTOMY      OB History    No data available       Home Medications    Prior to Admission medications   Medication Sig Start Date End Date Taking? Authorizing Provider  acetaminophen (TYLENOL) 500 MG tablet Take 2 tablets (1,000 mg total) by mouth 3 (three) times daily. 12/23/15  Yes Ria Bush, MD  calcitonin, salmon, (MIACALCIN/FORTICAL) 200 UNIT/ACT nasal spray Place 1 spray into alternate nostrils daily. 02/24/16  Yes Ria Bush, MD  Calcium-Magnesium-Vitamin D [phone] MG-MG-UNIT TABS Take 1 tablet by mouth 2 (two) times daily.   Yes Historical Provider, MD  felodipine (PLENDIL) 2.5 MG 24 hr tablet TAKE 1 TABLET DAILY 05/25/16  Yes Ria Bush, MD  ferrous sulfate 325 (65 FE) MG tablet Take 325 mg by mouth daily with breakfast.   Yes Historical Provider, MD  furosemide (LASIX) 20 MG tablet Take 2 tablets (40 mg total) by mouth daily as needed. 06/09/16  Yes Ria Bush, MD  latanoprost (XALATAN) 0.005 % ophthalmic solution Place 1 drop into both eyes at bedtime.    Yes Historical Provider, MD  levothyroxine (SYNTHROID, LEVOTHROID) 50 MCG tablet TAKE 1 TABLET DAILY Patient taking differently: TAKE 1 TABLET DAILY IN THE MORNING 06/18/16  Yes Ria Bush, MD  lidocaine (LIDODERM) 5 % Place 1 patch onto the skin daily. Remove & Discard  patch within 12 hours or as directed by MD 05/01/16  Yes Ria Bush, MD  LORazepam (ATIVAN) 0.5 MG tablet Take 1 tablet (0.5 mg total) by mouth every 8 (eight) hours as needed for anxiety. 07/17/16  Yes Ria Bush, MD  Misc Natural Products (FIBER 7) POWD Take by mouth daily.   Yes Historical Provider, MD  Multiple Vitamin (MULTIVITAMIN) capsule Take 1 capsule by mouth every morning.    Yes Historical Provider, MD  Multiple Vitamins-Minerals (ICAPS AREDS FORMULA PO) Take 1 tablet by mouth daily.    Yes Historical Provider, MD  Omega-3 Fatty Acids (FISH OIL CONCENTRATE) 1000 MG CAPS Take 2 capsules by mouth 2 (two) times daily.     Yes Historical Provider, MD  potassium chloride SA (K-DUR,KLOR-CON) 20 MEQ tablet Take 1 tablet (20 mEq  total) by mouth daily as needed. Patient taking differently: Take 20 mEq by mouth daily as needed (along with Lasix).  01/20/16  Yes Ria Bush, MD  sertraline (ZOLOFT) 50 MG tablet Take 1 tablet (50 mg total) by mouth daily. 02/18/16  Yes Ria Bush, MD  vitamin B-12 (CYANOCOBALAMIN) 500 MCG tablet Take 500 mcg by mouth daily.   Yes Historical Provider, MD  warfarin (COUMADIN) 2.5 MG tablet Take 1 tablet daily except 1 1/2 tablets on Mondays and Thursdays Patient taking differently: Take 3.75 mg by mouth every evening.  04/28/16  Yes Satira Sark, MD  enoxaparin (LOVENOX) 80 MG/0.8ML injection Inject 0.8 mLs (80 mg total) into the skin daily. 06/12/16 06/22/16  Satira Sark, MD  oxyCODONE (OXYCONTIN) 10 mg 12 hr tablet Take 1 tablet (10 mg total) by mouth every 12 (twelve) hours. Patient not taking: Reported on 07/26/2016 06/19/16   Ria Bush, MD  polyethylene glycol [facility] / Floria Raveling) packet Take 17 g by mouth daily. Patient not taking: Reported on 07/26/2016 11/15/15   Ria Bush, MD  SENEXON-S 8.6-50 MG tablet TAKE 1 TABLET BY MOUTH DAILY Patient not taking: Reported on 07/26/2016 06/22/16   Ria Bush, MD  traMADol (ULTRAM)  50 MG tablet Take 0.5-1 tablets (25-50 mg total) by mouth every 8 (eight) hours as needed for moderate pain. Patient not taking: Reported on 07/26/2016 06/19/16   Ria Bush, MD    Family History Family History  Problem Relation Age of Onset  . Coronary artery disease Mother 14    MI deceased  . Colon cancer Father 46  . Arrhythmia Sister   . Stroke Paternal Grandmother   . Diabetes Neg Hx     Social History Social History  Substance Use Topics  . Smoking status: Former Smoker    Packs/day: 1.00    Years: 20.00    Types: Cigarettes    Quit date: 10/13/1998  . Smokeless tobacco: Never Used  . Alcohol use No     Allergies   Propoxyphene n-acetaminophen; Codeine; Meperidine hcl; Morphine; Shellfish allergy; and Tramadol   Review of Systems Review of Systems  Constitutional: Positive for activity change and fatigue. Negative for chills and fever.  HENT: Negative for congestion, sore throat and trouble swallowing.   Respiratory: Negative for cough, chest tightness and shortness of breath.   Cardiovascular: Negative for chest pain.  Gastrointestinal: Positive for nausea. Negative for abdominal pain and vomiting.  Genitourinary: Negative for difficulty urinating, dysuria and flank pain.  Musculoskeletal: Positive for arthralgias and myalgias. Negative for joint swelling, neck pain and neck stiffness.  Skin: Negative for rash.  Neurological: Positive for weakness (generalized weakness). Negative for dizziness and numbness.     Physical Exam Updated Vital Signs BP 166/89   Pulse 88   Temp 97.4 F (36.3 C) (Oral)   Resp 23   Ht 5\' 5"  (1.651 m)   Wt 47.6 kg   SpO2 94%   BMI 17.47 kg/m   Physical Exam  Constitutional: She is oriented to person, place, and time. No distress.  Frail appearing  HENT:  Head: Atraumatic.  Mucus membranes are dry  Neck: Normal range of motion. Neck supple. No JVD present. No thyromegaly present.  Cardiovascular: Normal rate and  intact distal pulses.  A regularly irregular rhythm present.  No murmur heard. Pulmonary/Chest: Effort normal and breath sounds normal. No respiratory distress.  Abdominal: Soft. She exhibits no mass. There is no tenderness. There is no guarding.  Musculoskeletal: Normal range of  motion.  Neurological: She is alert and oriented to person, place, and time. No cranial nerve deficit.  Skin: Skin is warm and dry.  Psychiatric: She has a normal mood and affect.  Nursing note and vitals reviewed.    ED Treatments / Results  Labs (all labs ordered are listed, but only abnormal results are displayed) Labs Reviewed  URINALYSIS, ROUTINE W REFLEX MICROSCOPIC - Abnormal; Notable for the following:       Result Value   APPearance HAZY (*)    Specific Gravity, Urine 1.004 (*)    All other components within normal limits  CBC WITH DIFFERENTIAL/PLATELET - Abnormal; Notable for the following:    MCV 104.3 (*)    MCH 35.2 (*)    Monocytes Absolute 1.3 (*)    All other components within normal limits  TROPONIN I - Abnormal; Notable for the following:    Troponin I 0.03 (*)    All other components within normal limits  COMPREHENSIVE METABOLIC PANEL - Abnormal; Notable for the following:    Potassium 2.8 (*)    Glucose, Bld 103 (*)    All other components within normal limits  INFLUENZA PANEL BY PCR (TYPE A & B, H1N1)  BRAIN NATRIURETIC PEPTIDE    EKG  EKG Interpretation  Date/Time:  Sunday July 26 2016 22:27:17 EST Ventricular Rate:  98 PR Interval:    QRS Duration: 100 QT Interval:  363 QTC Calculation: 464 R Axis:   26 Text Interpretation:  Atrial fibrillation Ventricular premature complex Anteroseptal infarct, age indeterminate Confirmed by ZAMMIT  MD, JOSEPH [phone]) on 07/26/2016 11:07:29 PM       Radiology Dg Chest Portable 1 View  Result Date: 07/26/2016 CLINICAL DATA:  Acute onset of generalized weakness and body aches. Initial encounter. EXAM: PORTABLE CHEST 1 VIEW  COMPARISON:  CTA of the chest, and chest radiograph performed 07/12/2016 FINDINGS: The lungs are well-aerated. Vascular congestion is noted. Peribronchial thickening is seen. There is no evidence of focal opacification, pleural effusion or pneumothorax. The cardiomediastinal silhouette is borderline normal in size. No acute osseous abnormalities are seen. IMPRESSION: Vascular congestion noted.  Peribronchial thickening seen. Electronically Signed   By: Garald Balding M.D.   On: 07/26/2016 22:39    Procedures Procedures (including critical care time)  Medications Ordered in ED Medications  potassium chloride 10 mEq in 100 mL IVPB (10 mEq Intravenous New Bag/Given 07/26/16 2333)  HYDROcodone-acetaminophen (NORCO/VICODIN) 5-325 MG per tablet 1 tablet (1 tablet Oral Given 07/26/16 2329)  potassium chloride SA (K-DUR,KLOR-CON) CR tablet 40 mEq (40 mEq Oral Given 07/26/16 2329)     Initial Impression / Assessment and Plan / ED Course  I have reviewed the triage vital signs and the nursing notes.  Pertinent labs & imaging results that were available during my care of the patient were reviewed by me and considered in my medical decision making (see chart for details).  Clinical Course    Pt also seen by Dr. Roderic Palau, care plan discussed.   Pt resting comfortably.  Labs reviewed, hypokalemic, trop slightly elevated.  CXR shows some vascular congestion.  Denies increased shortness of breath and chest pain.  Possibly related to potassium.  Doubt ACS, doubt CHF.  Pt agrees to admit.  43 Consulted hospitalist, Dr. Marin Comment who agrees to admit.    Final Clinical Impressions(s) / ED Diagnoses   Final diagnoses:  Hypokalemia  Weakness    New Prescriptions New Prescriptions   No medications on file     Borghild Thaker,  PA-C 07/27/16 0045    Milton Ferguson, MD 07/31/16 [phone]

## 2016-07-27 NOTE — H&P (Signed)
History and Physical    [name] [id number] DOB: [birth year] DOA: 07/26/2016  PCP: Ria Bush, MD  Patient coming from: Home.    Chief Complaint:  Myalgia, weakness, found to have K of 2.8.   HPI: [name] is an 83 y.o. female with hx of skin CA, anemia, TIA, chronic afib on Coumadin, hx of HTN, HLD, hypothyroidism on supplement, chronic pain syndrome, severe anxiety, lives at home with her husband, presented to the ER with myalgia, malaise, and was found to have a K of 2.8.  She denied fever, chills, coughs, distant travel or ill contacts.  She has anxiety and has been on low dose benzo.  She also has chronic pain and had been on percocet along with ultram.  Evaluation in the ER showed normal WBC with normal Hb, though macrocytic, renal fx test was normal, and her CXR was clear of infiltrate, some vascular congestion and some bronchial thickening.  Influenza test was negative as well.   She has no clinical symptoms of CHF.  Hospitalist was asked to admit her for viral syndrome along with hypokalemia. She has been on lasix along with K supplements.   ED Course:  See above.  Rewiew of Systems:  Constitutional: Negative for malaise, fever and chills. No significant weight loss or weight gain Eyes: Negative for eye pain, redness and discharge, diplopia, visual changes, or flashes of light. ENMT: Negative for ear pain, hoarseness, nasal congestion, sinus pressure and sore throat. No headaches; tinnitus, drooling, or problem swallowing. Cardiovascular: Negative for chest pain, palpitations, diaphoresis, dyspnea and peripheral edema. ; No orthopnea, PND Respiratory: Negative for cough, hemoptysis, wheezing and stridor. No pleuritic chestpain. Gastrointestinal: Negative for diarrhea, constipation,  melena, blood in stool, hematemesis, jaundice and rectal bleeding.    Genitourinary: Negative for frequency, dysuria, incontinence,flank pain and hematuria; Musculoskeletal:  Negative for back pain and neck pain. Negative for swelling and trauma.;  Skin: . Negative for pruritus, rash, abrasions, bruising and skin lesion.; ulcerations Neuro: Negative for headache, lightheadedness and neck stiffness. Negative for weakness, altered level of consciousness , altered mental status, extremity weakness, burning feet, involuntary movement, seizure and syncope.  Psych: negative for anxiety, depression, insomnia, tearfulness, panic attacks, hallucinations, paranoia, suicidal or homicidal ideation   Past Medical History:  Diagnosis Date  . Abnormal drug screen 06/2015  . Anemia   . Anxiety   . Aortic sclerosis   . Arthritis   . Chronic gastritis 10/2009   Duodenitis on EGD  . CKD (chronic kidney disease) stage 2, GFR 60-89 ml/min   . Collagen vascular disease ([place])   . COPD (chronic obstructive pulmonary disease) ([place])   . Diabetes type 2, controlled ([place])    Diet controlled  . Diverticulosis   . Dry ARMD    Retinal hemorrhage (09/2013) [place]  . Essential hypertension, benign   . GERD (gastroesophageal reflux disease)   . Glaucoma    Dr. Venetia Maxon  . Hiatal hernia 2011   Small  . History of pelvic fracture April 2013  . History of rheumatic fever   . History of skin cancer   . History of TIA (transient ischemic attack)   . Hypothyroidism   . Macular degeneration   . Mixed hyperlipidemia   . Osteoporosis 04/2013   Thoracic compression fracture, on bisphosphonate and cal/vit D  . Paroxysmal atrial fibrillation (HCC)   . Pulmonary nodules    Stable bilateral on CT 01/2010, rec rpt yearly for 2 yrs, pt decided to stop f/u  .  Seasonal allergies   . Vertebral compression fracture ([place]) 05/2016   s/p vertebroplasty L2/L3    Past Surgical History:  Procedure Laterality Date  . BREAST LUMPECTOMY  1983   LEFT, benign  . Carotid US  2011   mild plaque formation  . CATARACT EXTRACTION  2012   RIGHT  . CESAREAN SECTION     (and h/o 6 miscarriages)  .  CHOLECYSTECTOMY N/A 02/27/2013   Procedure: LAPAROSCOPIC CHOLECYSTECTOMY;  Surgeon: Donato Heinz, MD;  Location: [facility];  Service: General;  Laterality: N/A;  . COLONOSCOPY  03/2010   int hemorrhoids, diverticulosis, no need to repeat (Dr. Sydell Axon)  . DEXA  05/2010   T score -4.5 spine, -2.4 femur; does not want to repeat  . DEXA  04/2013   T score 3.9 [facility] spine, 2.6 hip  . DILATION AND CURETTAGE OF UTERUS     x2  . ESOPHAGOGASTRODUODENOSCOPY  10/2009   small HH, multiple antric ulcerations s/p biopsy, mild chronic gastritis/duodenitis  . ESOPHAGOGASTRODUODENOSCOPY  05/26/2012   RMR: small HH, o/w normal.   . IR GENERIC HISTORICAL  04/16/2016   IR RADIOLOGIST EVAL & MGMT 04/16/2016 [facility]  . IR GENERIC HISTORICAL  06/16/2016   IR VERTEBROPLASTY LUMBAR BX INC UNI/BIL INC/INJECT/IMAGING 06/16/2016 Luanne Bras, MD [facility]  . IR GENERIC HISTORICAL  06/16/2016   IR VERTEBROPLASTY EA ADDL (T&LS) BX INC UNI/BIL INC INJECT/IMAGING 06/16/2016 Luanne Bras, MD [facility]  . LAPAROSCOPIC CHOLECYSTECTOMY  02/2013   Dr. Geroge Baseman  . PATELLA FRACTURE SURGERY  05/2006   LEFT surgically repaired with pins and wire  . TONSILLECTOMY       reports that she quit smoking about 17 years ago. Her smoking use included Cigarettes. She has a 20.00 pack-year smoking history. She has never used smokeless tobacco. She reports that she does not drink alcohol or use drugs.  Allergies  Allergen Reactions  . Propoxyphene N-Acetaminophen Shortness Of Breath  . Codeine Nausea And Vomiting  . Meperidine Hcl Nausea And Vomiting  . Morphine Nausea And Vomiting  . Shellfish Allergy Hives  . Tramadol Nausea Only    Family History  Problem Relation Age of Onset  . Coronary artery disease Mother 22    MI deceased  . Colon cancer Father 21  . Arrhythmia Sister   . Stroke Paternal Grandmother   . Diabetes Neg Hx      Prior to Admission medications   Medication Sig Start Date End Date  Taking? Authorizing Provider  acetaminophen (TYLENOL) 500 MG tablet Take 2 tablets (1,000 mg total) by mouth 3 (three) times daily. 12/23/15  Yes Ria Bush, MD  calcitonin, salmon, (MIACALCIN/FORTICAL) 200 UNIT/ACT nasal spray Place 1 spray into alternate nostrils daily. 02/24/16  Yes Ria Bush, MD  Calcium-Magnesium-Vitamin D [id number] MG-MG-UNIT TABS Take 1 tablet by mouth 2 (two) times daily.   Yes Historical Provider, MD  felodipine (PLENDIL) 2.5 MG 24 hr tablet TAKE 1 TABLET DAILY 05/25/16  Yes Ria Bush, MD  ferrous sulfate 325 (65 FE) MG tablet Take 325 mg by mouth daily with breakfast.   Yes Historical Provider, MD  furosemide (LASIX) 20 MG tablet Take 2 tablets (40 mg total) by mouth daily as needed. 06/09/16  Yes Ria Bush, MD  latanoprost (XALATAN) 0.005 % ophthalmic solution Place 1 drop into both eyes at bedtime.    Yes Historical Provider, MD  levothyroxine (SYNTHROID, LEVOTHROID) 50 MCG tablet TAKE 1 TABLET DAILY Patient taking differently: TAKE 1 TABLET DAILY IN THE MORNING  06/18/16  Yes Ria Bush, MD  lidocaine (LIDODERM) 5 % Place 1 patch onto the skin daily. Remove & Discard patch within 12 hours or as directed by MD 05/01/16  Yes Ria Bush, MD  LORazepam (ATIVAN) 0.5 MG tablet Take 1 tablet (0.5 mg total) by mouth every 8 (eight) hours as needed for anxiety. 07/17/16  Yes Ria Bush, MD  Misc Natural Products (FIBER 7) POWD Take by mouth daily.   Yes Historical Provider, MD  Multiple Vitamin (MULTIVITAMIN) capsule Take 1 capsule by mouth every morning.    Yes Historical Provider, MD  Multiple Vitamins-Minerals (ICAPS AREDS FORMULA PO) Take 1 tablet by mouth daily.    Yes Historical Provider, MD  Omega-3 Fatty Acids (FISH OIL CONCENTRATE) 1000 MG CAPS Take 2 capsules by mouth 2 (two) times daily.     Yes Historical Provider, MD  potassium chloride SA (K-DUR,KLOR-CON) 20 MEQ tablet Take 1 tablet (20 mEq total) by mouth daily as  needed. Patient taking differently: Take 20 mEq by mouth daily as needed (along with Lasix).  01/20/16  Yes Ria Bush, MD  sertraline (ZOLOFT) 50 MG tablet Take 1 tablet (50 mg total) by mouth daily. 02/18/16  Yes Ria Bush, MD  vitamin B-12 (CYANOCOBALAMIN) 500 MCG tablet Take 500 mcg by mouth daily.   Yes Historical Provider, MD  warfarin (COUMADIN) 2.5 MG tablet Take 1 tablet daily except 1 1/2 tablets on Mondays and Thursdays Patient taking differently: Take 3.75 mg by mouth every evening.  04/28/16  Yes Satira Sark, MD  enoxaparin (LOVENOX) 80 MG/0.8ML injection Inject 0.8 mLs (80 mg total) into the skin daily. 06/12/16 06/22/16  Satira Sark, MD  oxyCODONE (OXYCONTIN) 10 mg 12 hr tablet Take 1 tablet (10 mg total) by mouth every 12 (twelve) hours. Patient not taking: Reported on 07/26/2016 06/19/16   Ria Bush, MD  polyethylene glycol [facility] Floria Raveling) packet Take 17 g by mouth daily. Patient not taking: Reported on 07/26/2016 11/15/15   Ria Bush, MD  SENEXON-S 8.6-50 MG tablet TAKE 1 TABLET BY MOUTH DAILY Patient not taking: Reported on 07/26/2016 06/22/16   Ria Bush, MD  traMADol (ULTRAM) 50 MG tablet Take 0.5-1 tablets (25-50 mg total) by mouth every 8 (eight) hours as needed for moderate pain. Patient not taking: Reported on 07/26/2016 06/19/16   Ria Bush, MD    Physical Exam: Vitals:   07/26/16 2230 07/26/16 2300 07/26/16 2330 07/27/16 0000  BP: 142/88 138/90 166/89 148/91  Pulse: 92 96 88 91  Resp: 18 13 23 20   Temp:      TempSrc:      SpO2: 97% 94% 94% 93%  Weight:      Height:        Constitutional: NAD, calm, comfortable Vitals:   07/26/16 2230 07/26/16 2300 07/26/16 2330 07/27/16 0000  BP: 142/88 138/90 166/89 148/91  Pulse: 92 96 88 91  Resp: 18 13 23 20   Temp:      TempSrc:      SpO2: 97% 94% 94% 93%  Weight:      Height:       Eyes: PERRL, lids and conjunctivae normal ENMT: Mucous membranes are moist. Posterior  pharynx clear of any exudate or lesions.Normal dentition.  Neck: normal, supple, no masses, no thyromegaly Respiratory: clear to auscultation bilaterally, no wheezing, no crackles. Normal respiratory effort. No accessory muscle use.  Cardiovascular: Regular rate and rhythm, no murmurs / rubs / gallops. No extremity edema. 2+ pedal pulses. No carotid bruits.  Abdomen: no tenderness, no masses palpated. No hepatosplenomegaly. Bowel sounds positive.  Musculoskeletal: no clubbing / cyanosis. No joint deformity upper and lower extremities. Good ROM, no contractures. Normal muscle tone.  Skin: no rashes, lesions, ulcers. No induration Neurologic: CN 2-12 grossly intact. Sensation intact, DTR normal. Strength 5/5 in all 4.  Psychiatric: Normal judgment and insight. Alert and oriented x 3. Normal mood.    Labs on Admission: I have personally reviewed following labs and imaging studies  CBC:  Recent Labs Lab 07/26/16 2211  WBC 7.4  NEUTROABS 3.2  HGB 13.9  HCT 41.2  MCV 104.3*  PLT [id number]   Basic Metabolic Panel:  Recent Labs Lab 07/26/16 2211  NA 138  K 2.8*  CL 101  CO2 29  GLUCOSE 103*  BUN 14  CREATININE 0.51  CALCIUM 9.6   GFR: Estimated Creatinine Clearance: 36.5 mL/min (by C-G formula based on SCr of 0.51 mg/dL). Liver Function Tests:  Recent Labs Lab 07/26/16 2211  AST 33  ALT 40  ALKPHOS 77  BILITOT 1.1  PROT 7.2  ALBUMIN 3.8   Cardiac Enzymes:  Recent Labs Lab 07/26/16 2211  TROPONINI 0.03*   Urine analysis:    Component Value Date/Time   COLORURINE YELLOW 07/26/2016 2219   APPEARANCEUR HAZY (A) 07/26/2016 2219   LABSPEC 1.004 (L) 07/26/2016 2219   PHURINE 8.0 07/26/2016 2219   GLUCOSEU NEGATIVE 07/26/2016 2219   HGBUR NEGATIVE 07/26/2016 2219   [place] NEGATIVE 07/26/2016 2219   KETONESUR NEGATIVE 07/26/2016 2219   PROTEINUR NEGATIVE 07/26/2016 2219   UROBILINOGEN 0.2 12/25/2013 2250   NITRITE NEGATIVE 07/26/2016 2219   LEUKOCYTESUR  NEGATIVE 07/26/2016 2219    Radiological Exams on Admission: Dg Chest Portable 1 View  Result Date: 07/26/2016 CLINICAL DATA:  Acute onset of generalized weakness and body aches. Initial encounter. EXAM: PORTABLE CHEST 1 VIEW COMPARISON:  CTA of the chest, and chest radiograph performed 07/12/2016 FINDINGS: The lungs are well-aerated. Vascular congestion is noted. Peribronchial thickening is seen. There is no evidence of focal opacification, pleural effusion or pneumothorax. The cardiomediastinal silhouette is borderline normal in size. No acute osseous abnormalities are seen. IMPRESSION: Vascular congestion noted.  Peribronchial thickening seen. Electronically Signed   By: Garald Balding M.D.   On: 07/26/2016 22:39    EKG: Independently reviewed.   Assessment/Plan Active Problems:   Hyperlipidemia   Persistent atrial fibrillation (HCC)   Fatigue   Hypothyroidism   Acute viral syndrome   Hypokalemia   DNR (do not resuscitate)   Viral syndrome    PLAN:   Viral syndrome:  Will continue with symptomatic Tx.  Her influenza test was negative. Given her advanced age, will avoid narcotic if possible.   Hypokalemia:  Will give IVF with KCL.  Supplement with oral K also.  Check Magnesium.   Hold Lasix.  Hypothryodism:  Continue with supplement.   Check TSH.   Afib, chronic, rate controlled:  Continue with Coumadin per pharmacy dosing.    Macrocytosis:  Best to give her B12 and folate supplements.  She has been on 567mcg of B12 outpatient.  Will give her one shot of 1013mcg B12, and start folate 1mg  per day.    DVT prophylaxis: Coumadin.  Code Status: DNR  Confirmed tonight with her and her husband.  Family Communication: Husband at bedside.  Disposition Plan: To home when better.  Consults called: None.  Admission status: OBS.    Gedalya Jim MD FACP. [facility]  If 7PM-7AM, please contact night-coverage [email or web address] Password [email or web address]  07/27/2016, 12:41 AM

## 2016-07-27 NOTE — Evaluation (Signed)
Physical Therapy Evaluation Patient Details Name: JACOBI RYANT MRN: [id number] DOB: 04-Aug-1925 Today's Date: 07/27/2016   History of Present Illness  83 y.o. female with hx of skin CA, anemia, TIA, chronic afib on Coumadin, hx of HTN, HLD, hypothyroidism on supplement, chronic pain syndrome, severe anxiety, lives at home with her husband, presented to the ER with myalgia, malaise, and was found to have a K of 2.8.  She denied fever, chills, coughs, distant travel or ill contacts.  She has anxiety and has been on low dose benzo.  She also has chronic pain and had been on percocet along with ultram.  Evaluation in the ER showed normal WBC with normal Hb, though macrocytic, renal fx test was normal, and her CXR was clear of infiltrate, some vascular congestion and some bronchial thickening.  Influenza test was negative as well.   She has no clinical symptoms of CHF.  Hospitalist was asked to admit her for viral syndrome along with hypokalemia. She has been on lasix along with K supplements.   Clinical Impression  Pt received sitting up in the chair, and was agreeable to PT evaluation.  Pt expressed that she lives with her husband, and uses a rollator for ambulation.  She requires assistance for bathing and washing her hair, but is independent with dressing.  During PT evaluation she was able to ambulate 857f with Rollator, but requires directional cues for how to return to her room.  Recommend that she continue with her HHPT to continue with strengthening, balance, and endurance.      Follow Up Recommendations Home health PT;Supervision/Assistance - 24 hour    Equipment Recommendations  None recommended by PT    Recommendations for Other Services       Precautions / Restrictions Precautions Precautions: Fall Precaution Comments: due to immobility Restrictions Weight Bearing Restrictions: No      Mobility  Bed Mobility Overal bed mobility: Needs Assistance Bed Mobility: Sit to Supine       Sit to supine: Min assist   General bed mobility comments: Min A required to lift LE's up into the bed.   Transfers Overall transfer level: Needs assistance Equipment used: 4-wheeled walker Transfers: Sit to/from Stand Sit to Stand: Supervision            Ambulation/Gait Ambulation/Gait assistance: Supervision Ambulation Distance (Feet): 800 Feet Assistive device: 4-wheeled walker Gait Pattern/deviations: Step-through pattern;Trunk flexed        Stairs            Wheelchair Mobility    Modified Rankin (Stroke Patients Only)       Balance Overall balance assessment: Needs assistance Sitting-balance support: Feet supported       Standing balance support: Bilateral upper extremity supported Standing balance-Leahy Scale: Fair Standing balance comment: Rollator                             Pertinent Vitals/Pain Pain Assessment: 0-10 Pain Score: 2  Pain Location: back Pain Descriptors / Indicators: Aching Pain Intervention(s): Limited activity within patient's tolerance;Monitored during session;RN gave pain meds during session    Home Living   Living Arrangements: Spouse/significant other   Type of Home: House Home Access: Level entry     Home Layout: One level Home Equipment: Walker - 4 wheels;Cane - single point;Bedside commode;Shower seat      Prior Function Level of Independence: Independent with assistive device(s)   Gait / Transfers Assistance Needed: Rollator for ambualtion  ADL's / Homemaking Assistance Needed: assistance for bathing and washing hair, but indepdndent for dressing.          Hand Dominance        Extremity/Trunk Assessment   Upper Extremity Assessment Upper Extremity Assessment: Overall WFL for tasks assessed    Lower Extremity Assessment Lower Extremity Assessment: Overall WFL for tasks assessed       Communication      Cognition Arousal/Alertness: Awake/alert Behavior During Therapy:  WFL for tasks assessed/performed Overall Cognitive Status: Within Functional Limits for tasks assessed                 General Comments: Although pt is able to answer questions correctly, she does seem to have some forgetfulness, and requires directional cues at each corner when ambulating around the nurses station several times.  Pt also requires reminders as to where her room is located.     General Comments      Exercises     Assessment/Plan    PT Assessment All further PT needs can be met in the next venue of care  PT Problem List Decreased strength;Decreased activity tolerance;Decreased balance;Decreased mobility;Decreased knowledge of use of DME;Decreased safety awareness;Pain          PT Treatment Interventions      PT Goals (Current goals can be found in the Care Plan section)  Acute Rehab PT Goals PT Goal Formulation: All assessment and education complete, DC therapy    Frequency     Barriers to discharge        Co-evaluation               End of Session Equipment Utilized During Treatment: Gait belt Activity Tolerance: Patient tolerated treatment well Patient left: in chair Nurse Communication: Mobility status (Erin, RN notified of pt's mobility status and recommendation for supervision due to some forgetfulness. )    Functional Assessment Tool Used: [employer] "6-clicks"  Functional Limitation: Mobility: Walking and moving around Mobility: Walking and Moving Around Current Status [phone]): At least 1 percent but less than 20 percent impaired, limited or restricted Mobility: Walking and Moving Around Goal Status [phone]): At least 1 percent but less than 20 percent impaired, limited or restricted Mobility: Walking and Moving Around Discharge Status [phone]): At least 1 percent but less than 20 percent impaired, limited or restricted    Time: 8882-8003 PT Time Calculation (min) (ACUTE ONLY): 38 min   Charges:   PT Evaluation $PT  Eval Low Complexity: 1 Procedure PT Treatments $Gait Training: 8-22 mins   PT G Codes:   PT G-Codes **NOT FOR INPATIENT CLASS** Functional Assessment Tool Used: [employer] "6-clicks"  Functional Limitation: Mobility: Walking and moving around Mobility: Walking and Moving Around Current Status [phone]): At least 1 percent but less than 20 percent impaired, limited or restricted Mobility: Walking and Moving Around Goal Status [phone]): At least 1 percent but less than 20 percent impaired, limited or restricted Mobility: Walking and Moving Around Discharge Status [phone]): At least 1 percent but less than 20 percent impaired, limited or restricted    Beth Sorren Vallier, PT, DPT X: [phone]

## 2016-07-27 NOTE — Progress Notes (Signed)
PROGRESS NOTE    [name]  [id number] DOB: [birth year] DOA: 07/26/2016 PCP: Ria Bush, MD   Brief Narrative: 83 y.o. female with hx of skin CA, anemia, TIA, chronic afib on Coumadin, hx of HTN, HLD, hypothyroidism on supplement, chronic pain syndrome, severe anxiety, lives at home with her husband, presented to the ER with myalgia, malaise, and was found to have a K of 2.8.   Assessment & Plan:   Active Problems:   Hyperlipidemia   Persistent atrial fibrillation (HCC)   Fatigue   Hypothyroidism   Acute viral syndrome   Hypokalemia   DNR (do not resuscitate)   Viral syndrome  #Generalized weakness and fatigue likely physical deconditioning and exacerbated by hypokalemia: -TSH level acceptable -Discontinue IV fluid and encourage oral intake. Continue supportive care -Influenza negative, UA negative for UTI. Chest x-ray with no pneumonia but with vascular congestion. -PT, OT evaluation. Patient may benefit from rehabilitation. Discussed with the social worker and care management team.  #hypothyroidism: TSH level acceptable. Continue Synthroid.  #Hypokalemia: Continue potassium chloride IV and oral. Magnesium level acceptable. Continue to hold diuretics. Repeat lab in the morning.  #Anxiety depression: Continue Zoloft.  #Persistent atrial fibrillation: Monitor heart rate. Continue Coumadin. INR level therapeutic. Heart rate is acceptable.  #Hypotension on admission: Improved now.   DVT prophylaxis: Systemic anticoagulation Code Status: DO NOT RESUSCITATE Family Communication: Patient's husband at bedside Disposition Plan: Likely discharge home with home care versus rehabilitation in 1-2 days. Patient was admitted in telemetry floor, currently in ICU because of bed issue.  Consultants:   none  Procedures: None Antimicrobials: None  Subjective: Patient was seen and examined at bedside. Patient reported generalized weakness, fatigue. Denied chest pain,  shortness of breath, nausea vomiting or abdominal pain.   Objective: Vitals:   07/27/16 0200 07/27/16 0400 07/27/16 0800 07/27/16 1017  BP:    136/84  Pulse:      Resp:      Temp: 98.4 F (36.9 C) 98.4 F (36.9 C) 98.7 F (37.1 C)   TempSrc: Oral Oral Oral   SpO2:      Weight: 46.3 kg (102 lb 1.2 oz)     Height: 5\' 5"  (1.651 m)       Intake/Output Summary (Last 24 hours) at 07/27/16 1220 Last data filed at 07/27/16 1018  Gross per 24 hour  Intake              343 ml  Output              375 ml  Net              -32 ml   Filed Weights   07/26/16 2138 07/27/16 0200  Weight: 47.6 kg (105 lb) 46.3 kg (102 lb 1.2 oz)    Examination:  General exam: Pleasant elderly female lying in bed, comfortable. Respiratory system: Clear to auscultation. Respiratory effort normal. No wheezing or crackle Cardiovascular system: S1 & S2 heard, RRR.  No pedal edema. Gastrointestinal system: Abdomen is nondistended, soft and nontender. Normal bowel sounds heard. Central nervous system: Alert and oriented. No focal neurological deficits. Extremities: Symmetric 5 x 5 power. Skin: No rashes, lesions or ulcers Psychiatry: Judgement and insight appear normal. Mood & affect appropriate.     Data Reviewed: I have personally reviewed following labs and imaging studies  CBC:  Recent Labs Lab 07/26/16 2211  WBC 7.4  NEUTROABS 3.2  HGB 13.9  HCT 41.2  MCV 104.3*  PLT 301  Basic Metabolic Panel:  Recent Labs Lab 07/26/16 2211 07/27/16 0419  NA 138 140  K 2.8* 3.3*  CL 101 104  CO2 29 30  GLUCOSE 103* 122*  BUN 14 12  CREATININE 0.51 0.51  CALCIUM 9.6 8.3*  MG 1.9  --    GFR: Estimated Creatinine Clearance: 35.5 mL/min (by C-G formula based on SCr of 0.51 mg/dL). Liver Function Tests:  Recent Labs Lab 07/26/16 2211  AST 33  ALT 40  ALKPHOS 77  BILITOT 1.1  PROT 7.2  ALBUMIN 3.8   No results for input(s): LIPASE, AMYLASE in the last 168 hours. No results for  input(s): AMMONIA in the last 168 hours. Coagulation Profile:  Recent Labs Lab 07/27/16 0419  INR 2.09   Cardiac Enzymes:  Recent Labs Lab 07/26/16 2211  TROPONINI 0.03*   BNP (last 3 results) No results for input(s): PROBNP in the last 8760 hours. HbA1C: No results for input(s): HGBA1C in the last 72 hours. CBG: No results for input(s): GLUCAP in the last 168 hours. Lipid Profile: No results for input(s): CHOL, HDL, LDLCALC, TRIG, CHOLHDL, LDLDIRECT in the last 72 hours. Thyroid Function Tests:  Recent Labs  07/27/16 0419  TSH 2.750   Anemia Panel: No results for input(s): VITAMINB12, FOLATE, FERRITIN, TIBC, IRON, RETICCTPCT in the last 72 hours. Sepsis Labs: No results for input(s): PROCALCITON, LATICACIDVEN in the last 168 hours.  Recent Results (from the past 240 hour(s))  MRSA PCR Screening     Status: None   Collection Time: 07/27/16  2:34 AM  Result Value Ref Range Status   MRSA by PCR NEGATIVE NEGATIVE Final    Comment:        The GeneXpert MRSA Assay (FDA approved for NASAL specimens only), is one component of a comprehensive MRSA colonization surveillance program. It is not intended to diagnose MRSA infection nor to guide or monitor treatment for MRSA infections.          Radiology Studies: Dg Chest Portable 1 View  Result Date: 07/26/2016 CLINICAL DATA:  Acute onset of generalized weakness and body aches. Initial encounter. EXAM: PORTABLE CHEST 1 VIEW COMPARISON:  CTA of the chest, and chest radiograph performed 07/12/2016 FINDINGS: The lungs are well-aerated. Vascular congestion is noted. Peribronchial thickening is seen. There is no evidence of focal opacification, pleural effusion or pneumothorax. The cardiomediastinal silhouette is borderline normal in size. No acute osseous abnormalities are seen. IMPRESSION: Vascular congestion noted.  Peribronchial thickening seen. Electronically Signed   By: Garald Balding M.D.   On: 07/26/2016 22:39         Scheduled Meds: . felodipine  2.5 mg Oral Daily  . folic acid  1 mg Oral Daily  . latanoprost  1 drop Both Eyes QHS  . levothyroxine  50 mcg Oral QAC breakfast  . omega-3 acid ethyl esters  1 g Oral Daily  . polyethylene glycol  17 g Oral Daily  . potassium chloride  40 mEq Oral Daily  . sertraline  50 mg Oral Daily  . sodium chloride flush  3 mL Intravenous Q12H  . warfarin  3.75 mg Oral Once  . [START ON 07/28/2016] Warfarin - Pharmacist Dosing Inpatient   Does not apply Q24H   Continuous Infusions: . 0.9 % NaCl with KCl 40 mEq / L       LOS: 0 days    Germaine Ripp Tanna Furry, MD [facility] Pager [phone]  If 7PM-7AM, please contact night-coverage [email or web address] Password [email or web address] 07/27/2016, 12:20 PM

## 2016-07-27 NOTE — Care Management Obs Status (Signed)
[facility] NOTIFICATION   Patient Details  Name: [name] MRN: [id number] Date of Birth: [birth year]   Medicare Observation Status Notification Given:  Yes    Win Guajardo, Chauncey Reading, RN 07/27/2016, 2:43 PM

## 2016-07-27 NOTE — Progress Notes (Addendum)
ANTICOAGULATION CONSULT NOTE - Initial Consult  Pharmacy Consult for Warfarin Indication: atrial fibrillation  Allergies  Allergen Reactions  . Propoxyphene N-Acetaminophen Shortness Of Breath  . Codeine Nausea And Vomiting  . Meperidine Hcl Nausea And Vomiting  . Morphine Nausea And Vomiting  . Shellfish Allergy Hives  . Tramadol Nausea Only    Patient Measurements: Height: 5\' 5"  (165.1 cm) Weight: 102 lb 1.2 oz (46.3 kg) IBW/kg (Calculated) : 57  Vital Signs: Temp: 98.4 F (36.9 C) (01/08 0400) Temp Source: Oral (01/08 0400) BP: 140/93 (01/08 0100) Pulse Rate: 89 (01/08 0100)  Labs:  Recent Labs  07/26/16 2211 07/27/16 0419  HGB 13.9  --   HCT 41.2  --   PLT 301  --   LABPROT  --  23.8*  INR  --  2.09  CREATININE 0.51 0.51  TROPONINI 0.03*  --     Estimated Creatinine Clearance: 35.5 mL/min (by C-G formula based on SCr of 0.51 mg/dL).   Medical History: Past Medical History:  Diagnosis Date  . Abnormal drug screen 06/2015  . Anemia   . Anxiety   . Aortic sclerosis   . Arthritis   . Chronic gastritis 10/2009   Duodenitis on EGD  . CKD (chronic kidney disease) stage 2, GFR 60-89 ml/min   . Collagen vascular disease ([place])   . COPD (chronic obstructive pulmonary disease) ([place])   . Diabetes type 2, controlled ([place])    Diet controlled  . Diverticulosis   . Dry ARMD    Retinal hemorrhage (09/2013) [place]  . Essential hypertension, benign   . GERD (gastroesophageal reflux disease)   . Glaucoma    Dr. Venetia Maxon  . Hiatal hernia 2011   Small  . History of pelvic fracture April 2013  . History of rheumatic fever   . History of skin cancer   . History of TIA (transient ischemic attack)   . Hypothyroidism   . Macular degeneration   . Mixed hyperlipidemia   . Osteoporosis 04/2013   Thoracic compression fracture, on bisphosphonate and cal/vit D  . Paroxysmal atrial fibrillation (HCC)   . Pulmonary nodules    Stable bilateral on CT 01/2010, rec rpt yearly  for 2 yrs, pt decided to stop f/u  . Seasonal allergies   . Vertebral compression fracture ([place]) 05/2016   s/p vertebroplasty L2/L3   Medications:  Prescriptions Prior to Admission  Medication Sig Dispense Refill Last Dose  . acetaminophen (TYLENOL) 500 MG tablet Take 2 tablets (1,000 mg total) by mouth 3 (three) times daily.   07/26/2016 at Unknown time  . calcitonin, salmon, (MIACALCIN/FORTICAL) 200 UNIT/ACT nasal spray Place 1 spray into alternate nostrils daily. 3.7 mL 12 07/26/2016 at Unknown time  . Calcium-Magnesium-Vitamin D [id number] MG-MG-UNIT TABS Take 1 tablet by mouth 2 (two) times daily.   07/26/2016 at Unknown time  . felodipine (PLENDIL) 2.5 MG 24 hr tablet TAKE 1 TABLET DAILY 90 tablet 1 07/26/2016 at Unknown time  . ferrous sulfate 325 (65 FE) MG tablet Take 325 mg by mouth daily with breakfast.   07/26/2016 at Unknown time  . furosemide (LASIX) 20 MG tablet Take 2 tablets (40 mg total) by mouth daily as needed. 180 tablet 0 UNKNOWN  . latanoprost (XALATAN) 0.005 % ophthalmic solution Place 1 drop into both eyes at bedtime.    07/25/2016 at Unknown time  . levothyroxine (SYNTHROID, LEVOTHROID) 50 MCG tablet TAKE 1 TABLET DAILY (Patient taking differently: TAKE 1 TABLET DAILY IN THE MORNING) 90 tablet 3 07/26/2016  at Unknown time  . lidocaine (LIDODERM) 5 % Place 1 patch onto the skin daily. Remove & Discard patch within 12 hours or as directed by MD 30 patch 6 07/26/2016 at Unknown time  . LORazepam (ATIVAN) 0.5 MG tablet Take 1 tablet (0.5 mg total) by mouth every 8 (eight) hours as needed for anxiety. 60 tablet 0 07/26/2016 at Unknown time  . Misc Natural Products (FIBER 7) POWD Take by mouth daily.   07/26/2016 at Unknown time  . Multiple Vitamin (MULTIVITAMIN) capsule Take 1 capsule by mouth every morning.    07/26/2016 at Unknown time  . Multiple Vitamins-Minerals (ICAPS AREDS FORMULA PO) Take 1 tablet by mouth daily.    07/26/2016 at Unknown time  . Omega-3 Fatty Acids (FISH OIL CONCENTRATE)  1000 MG CAPS Take 2 capsules by mouth 2 (two) times daily.     07/26/2016 at Unknown time  . potassium chloride SA (K-DUR,KLOR-CON) 20 MEQ tablet Take 1 tablet (20 mEq total) by mouth daily as needed. (Patient taking differently: Take 20 mEq by mouth daily as needed (along with Lasix). ) 30 tablet 1 UNKNOWN  . sertraline (ZOLOFT) 50 MG tablet Take 1 tablet (50 mg total) by mouth daily. 90 tablet 2 07/26/2016 at Unknown time  . vitamin B-12 (CYANOCOBALAMIN) 500 MCG tablet Take 500 mcg by mouth daily.   07/26/2016 at Unknown time  . warfarin (COUMADIN) 2.5 MG tablet Take 1 tablet daily except 1 1/2 tablets on Mondays and Thursdays (Patient taking differently: Take 3.75 mg by mouth every evening. ) 180 tablet 3 07/26/2016 at 1700  . enoxaparin (LOVENOX) 80 MG/0.8ML injection Inject 0.8 mLs (80 mg total) into the skin daily. 10 Syringe 1 06/15/2016 at Unknown time  . oxyCODONE (OXYCONTIN) 10 mg 12 hr tablet Take 1 tablet (10 mg total) by mouth every 12 (twelve) hours. (Patient not taking: Reported on 07/26/2016) 30 tablet 0 Not Taking at Unknown time  . polyethylene glycol (MIRALAX / GLYCOLAX) packet Take 17 g by mouth daily. (Patient not taking: Reported on 07/26/2016) 30 each 3 Not Taking at Unknown time  . SENEXON-S 8.6-50 MG tablet TAKE 1 TABLET BY MOUTH DAILY (Patient not taking: Reported on 07/26/2016) 30 tablet 3 Not Taking at Unknown time  . traMADol (ULTRAM) 50 MG tablet Take 0.5-1 tablets (25-50 mg total) by mouth every 8 (eight) hours as needed for moderate pain. (Patient not taking: Reported on 07/26/2016) 30 tablet 0 Not Taking at Unknown time   Assessment: Okay for Protocol, no bleeding noted.  INR at goal.  Goal of Therapy:  INR 2-3   Plan:  Continue home dose of 3.75mg  x 1. Daily PT/INR. Monitor for signs and symptoms of bleeding.   Pricilla Larsson 07/27/2016,8:16 AM

## 2016-07-27 NOTE — ED Notes (Signed)
Pt ambulated to restroom & returned to room w/ no complications. 

## 2016-07-27 NOTE — Care Management Note (Signed)
Case Management Note  Patient Details  Name: TED ALEJANDRO MRN: [id number] Date of Birth: 04/19/1926  Subjective/Objective: Patient adm from home with acute viral syndrome/hypokalemia. Patient lives with elderly husband, walks with a rollator. Son present at bedside. States that patient is active with [facility] for aide, PT, RN services. He states a neighbor checks on patient daily as he lives 40 minutes away. He states there may be a need for a higher level of care in the near future, options discussed with son and patient. PT consult pending. Patient asks about advanced directives, Chaplain notified and will follow up.   Action/Plan: CM following for needs. Anticipate DC home with resumption of home health services ,with added SW services.   Expected Discharge Date:       07/28/2016           Expected Discharge Plan:  [place]  In-House Referral:  Chaplain  Discharge planning Services  CM Consult  Post Acute Care Choice:  Home Health, Resumption of Svcs/PTA Provider Choice offered to:     DME Arranged:    DME Agency:     HH Arranged:    HH Agency:     Status of Service:  In process, will continue to follow  If discussed at Long Length of Stay Meetings, dates discussed:    Additional Comments:  Aradhana Gin, Chauncey Reading, RN 07/27/2016, 2:36 PM

## 2016-07-27 NOTE — ED Notes (Signed)
Pt says knees & head still hurting. Pt husband states she always in pain. Pt wanting some relief from pain. Pt says she has taking everything.

## 2016-07-28 ENCOUNTER — Ambulatory Visit: Payer: Medicare Other | Admitting: Cardiology

## 2016-07-28 DIAGNOSIS — R531 Weakness: Secondary | ICD-10-CM

## 2016-07-28 DIAGNOSIS — E038 Other specified hypothyroidism: Secondary | ICD-10-CM | POA: Diagnosis not present

## 2016-07-28 DIAGNOSIS — E876 Hypokalemia: Secondary | ICD-10-CM | POA: Diagnosis not present

## 2016-07-28 LAB — BASIC METABOLIC PANEL
Anion gap: 4 — ABNORMAL LOW (ref 5–15)
BUN: 15 mg/dL (ref 6–20)
CALCIUM: 8.2 mg/dL — AB (ref 8.9–10.3)
CO2: 26 mmol/L (ref 22–32)
CREATININE: 0.43 mg/dL — AB (ref 0.44–1.00)
Chloride: 107 mmol/L (ref 101–111)
GFR calc non Af Amer: >60 mL/min (ref >60)
Glucose, Bld: 98 mg/dL (ref 65–99)
Potassium: 4 mmol/L (ref 3.5–5.1)
SODIUM: 137 mmol/L (ref 135–145)

## 2016-07-28 LAB — PROTIME-INR
INR: 2
Prothrombin Time: 23 seconds — ABNORMAL HIGH (ref 11.4–15.2)

## 2016-07-28 MED ORDER — WARFARIN SODIUM 7.5 MG PO TABS: 3.7500 mg | ORAL_TABLET | Freq: Once | ORAL | Status: DC

## 2016-07-28 MED ORDER — POTASSIUM CHLORIDE CRYS ER 20 MEQ PO TBCR: 20.0000 meq | EXTENDED_RELEASE_TABLET | Freq: Every day | ORAL | 0 refills | Status: DC

## 2016-07-28 NOTE — Progress Notes (Signed)
[place] for Warfarin Indication: atrial fibrillation  Allergies  Allergen Reactions  . Propoxyphene N-Acetaminophen Shortness Of Breath  . Codeine Nausea And Vomiting  . Meperidine Hcl Nausea And Vomiting  . Morphine Nausea And Vomiting  . Shellfish Allergy Hives  . Tramadol Nausea Only   Patient Measurements: Height: 5\' 5"  (165.1 cm) Weight: 102 lb 1.2 oz (46.3 kg) IBW/kg (Calculated) : 57  Vital Signs: Temp: 98.3 F (36.8 C) (01/09 0652) Temp Source: Oral (01/09 0652) BP: 141/95 (01/09 0824) Pulse Rate: 84 (01/09 0824)  Labs:  Recent Labs  07/26/16 2211 07/27/16 0419 07/28/16 0532  HGB 13.9  --   --   HCT 41.2  --   --   PLT 301  --   --   LABPROT  --  23.8* 23.0*  INR  --  2.09 2.00  CREATININE 0.51 0.51 0.43*  TROPONINI 0.03*  --   --    Estimated Creatinine Clearance: 35.5 mL/min (by C-G formula based on SCr of 0.43 mg/dL (L)).  Medical History: Past Medical History:  Diagnosis Date  . Abnormal drug screen 06/2015  . Anemia   . Anxiety   . Aortic sclerosis   . Arthritis   . Chronic gastritis 10/2009   Duodenitis on EGD  . CKD (chronic kidney disease) stage 2, GFR 60-89 ml/min   . Collagen vascular disease ([place])   . COPD (chronic obstructive pulmonary disease) ([place])   . Diabetes type 2, controlled ([employer])    Diet controlled  . Diverticulosis   . Dry ARMD    Retinal hemorrhage (09/2013) [place]  . Essential hypertension, benign   . GERD (gastroesophageal reflux disease)   . Glaucoma    Dr. Venetia Maxon  . Hiatal hernia 2011   Small  . History of pelvic fracture April 2013  . History of rheumatic fever   . History of skin cancer   . History of TIA (transient ischemic attack)   . Hypothyroidism   . Macular degeneration   . Mixed hyperlipidemia   . Osteoporosis 04/2013   Thoracic compression fracture, on bisphosphonate and cal/vit D  . Paroxysmal atrial fibrillation (HCC)   . Pulmonary nodules    Stable  bilateral on CT 01/2010, rec rpt yearly for 2 yrs, pt decided to stop f/u  . Seasonal allergies   . Vertebral compression fracture ([place]) 05/2016   s/p vertebroplasty L2/L3   Medications:  Prescriptions Prior to Admission  Medication Sig Dispense Refill Last Dose  . acetaminophen (TYLENOL) 500 MG tablet Take 2 tablets (1,000 mg total) by mouth 3 (three) times daily.   07/26/2016 at Unknown time  . calcitonin, salmon, (MIACALCIN/FORTICAL) 200 UNIT/ACT nasal spray Place 1 spray into alternate nostrils daily. 3.7 mL 12 07/26/2016 at Unknown time  . Calcium-Magnesium-Vitamin D [id number] MG-MG-UNIT TABS Take 1 tablet by mouth 2 (two) times daily.   07/26/2016 at Unknown time  . felodipine (PLENDIL) 2.5 MG 24 hr tablet TAKE 1 TABLET DAILY 90 tablet 1 07/26/2016 at Unknown time  . ferrous sulfate 325 (65 FE) MG tablet Take 325 mg by mouth daily with breakfast.   07/26/2016 at Unknown time  . furosemide (LASIX) 20 MG tablet Take 2 tablets (40 mg total) by mouth daily as needed. 180 tablet 0 UNKNOWN  . latanoprost (XALATAN) 0.005 % ophthalmic solution Place 1 drop into both eyes at bedtime.    07/25/2016 at Unknown time  . levothyroxine (SYNTHROID, LEVOTHROID) 50 MCG tablet TAKE 1 TABLET DAILY (Patient  taking differently: TAKE 1 TABLET DAILY IN THE MORNING) 90 tablet 3 07/26/2016 at Unknown time  . lidocaine (LIDODERM) 5 % Place 1 patch onto the skin daily. Remove & Discard patch within 12 hours or as directed by MD 30 patch 6 07/26/2016 at Unknown time  . LORazepam (ATIVAN) 0.5 MG tablet Take 1 tablet (0.5 mg total) by mouth every 8 (eight) hours as needed for anxiety. 60 tablet 0 07/26/2016 at Unknown time  . Misc Natural Products (FIBER 7) POWD Take by mouth daily.   07/26/2016 at Unknown time  . Multiple Vitamin (MULTIVITAMIN) capsule Take 1 capsule by mouth every morning.    07/26/2016 at Unknown time  . Multiple Vitamins-Minerals (ICAPS AREDS FORMULA PO) Take 1 tablet by mouth daily.    07/26/2016 at Unknown time  .  Omega-3 Fatty Acids (FISH OIL CONCENTRATE) 1000 MG CAPS Take 2 capsules by mouth 2 (two) times daily.     07/26/2016 at Unknown time  . potassium chloride SA (K-DUR,KLOR-CON) 20 MEQ tablet Take 1 tablet (20 mEq total) by mouth daily as needed. (Patient taking differently: Take 20 mEq by mouth daily as needed (along with Lasix). ) 30 tablet 1 UNKNOWN  . sertraline (ZOLOFT) 50 MG tablet Take 1 tablet (50 mg total) by mouth daily. 90 tablet 2 07/26/2016 at Unknown time  . vitamin B-12 (CYANOCOBALAMIN) 500 MCG tablet Take 500 mcg by mouth daily.   07/26/2016 at Unknown time  . warfarin (COUMADIN) 2.5 MG tablet Take 1 tablet daily except 1 1/2 tablets on Mondays and Thursdays (Patient taking differently: Take 3.75 mg by mouth every evening. ) 180 tablet 3 07/26/2016 at 1700  . enoxaparin (LOVENOX) 80 MG/0.8ML injection Inject 0.8 mLs (80 mg total) into the skin daily. 10 Syringe 1 06/15/2016 at Unknown time  . oxyCODONE (OXYCONTIN) 10 mg 12 hr tablet Take 1 tablet (10 mg total) by mouth every 12 (twelve) hours. (Patient not taking: Reported on 07/26/2016) 30 tablet 0 Not Taking at Unknown time  . polyethylene glycol (MIRALAX / GLYCOLAX) packet Take 17 g by mouth daily. (Patient not taking: Reported on 07/26/2016) 30 each 3 Not Taking at Unknown time  . SENEXON-S 8.6-50 MG tablet TAKE 1 TABLET BY MOUTH DAILY (Patient not taking: Reported on 07/26/2016) 30 tablet 3 Not Taking at Unknown time  . traMADol (ULTRAM) 50 MG tablet Take 0.5-1 tablets (25-50 mg total) by mouth every 8 (eight) hours as needed for moderate pain. (Patient not taking: Reported on 07/26/2016) 30 tablet 0 Not Taking at Unknown time   Assessment: Okay for Protocol, no bleeding noted.  INR at goal.  Goal of Therapy:  INR 2-3   Plan:  Continue home dose of 3.75mg  x 1. Daily PT/INR. Monitor for signs and symptoms of bleeding.   [place] Crane, Nashonda Limberg A 07/28/2016,8:59 AM

## 2016-07-28 NOTE — Discharge Summary (Signed)
Physician Discharge Summary  [name] [id number] DOB: [birth year] DOA: 07/26/2016  PCP: Ria Bush, MD  Admit date: 07/26/2016 Discharge date: 07/28/2016  Admitted From:Home Disposition: Home with home care    Recommendations for Outpatient Follow-up:  1. Follow up with PCP in 1-2 weeks 2. Please obtain BMP/CBC in one week   Home Health: Yes Equipment/Devices: No Discharge Condition: Stable CODE STATUS: DO NOT RESUSCITATE Diet recommendation: Heart healthy  Brief/Interim Summary:83 y.o.femalewith hx of skin CA, anemia, TIA, chronic afib on Coumadin, hx of HTN, HLD, hypothyroidism on supplement, chronic pain syndrome, severe anxiety, lives at home with her husband, presented to the ER with myalgia, malaise, and was found to have a K of 2.8.   #Generalized weakness and fatigue likely physical deconditioning and exacerbated by hypokalemia: -TSH level acceptable -Influenza negative, UA negative for UTI. Chest x-ray with no pneumonia but with vascular congestion. -PT, OT evaluation evaluated the patient recommended home PT. Discussed with the case manager. Patient is being discharged home with home care including PT, nurse, home health aide.   #hypothyroidism: TSH level acceptable. Continue Synthroid.  #Hypokalemia: Recommended to continue potassium chloride at home and follow up with PCP. Recommended to monitor labs. Encourage oral intake.  #Anxiety depression: Continue Zoloft.  #Persistent atrial fibrillation: Monitor heart rate. Continue Coumadin.   #Hypotension on admission: Improved now  Patient is clinically improved. Evaluated by PT, OT and case manager. Discharge home with home care. Recommended outpatient follow-up.  Discharge Diagnoses:  Active Problems:   Hyperlipidemia   Persistent atrial fibrillation (HCC)   Weakness   Hypothyroidism   Acute viral syndrome   Hypokalemia   DNR (do not resuscitate)   Viral syndrome    Discharge  Instructions  Discharge Instructions    Call MD for:  difficulty breathing, headache or visual disturbances    Complete by:  As directed    Call MD for:  extreme fatigue    Complete by:  As directed    Call MD for:  hives    Complete by:  As directed    Call MD for:  persistant dizziness or light-headedness    Complete by:  As directed    Call MD for:  persistant nausea and vomiting    Complete by:  As directed    Call MD for:  severe uncontrolled pain    Complete by:  As directed    Call MD for:  temperature >100.4    Complete by:  As directed    Diet - low sodium heart healthy    Complete by:  As directed    Increase activity slowly    Complete by:  As directed      Allergies as of 07/28/2016      Reactions   Propoxyphene N-acetaminophen Shortness Of Breath   Codeine Nausea And Vomiting   Meperidine Hcl Nausea And Vomiting   Morphine Nausea And Vomiting   Shellfish Allergy Hives   Tramadol Nausea Only      Medication List    STOP taking these medications   enoxaparin 80 MG/0.8ML injection Commonly known as:  LOVENOX   ICAPS AREDS FORMULA PO   oxyCODONE 10 mg 12 hr tablet Commonly known as:  OXYCONTIN   polyethylene glycol packet Commonly known as:  MIRALAX / GLYCOLAX   SENEXON-S 8.6-50 MG tablet Generic drug:  senna-docusate   traMADol 50 MG tablet Commonly known as:  ULTRAM     TAKE these medications   acetaminophen 500 MG tablet Commonly known  as:  TYLENOL Take 2 tablets (1,000 mg total) by mouth 3 (three) times daily.   calcitonin (salmon) 200 UNIT/ACT nasal spray Commonly known as:  MIACALCIN/FORTICAL Place 1 spray into alternate nostrils daily.   Calcium-Magnesium-Vitamin D [id number] MG-MG-UNIT Tabs Take 1 tablet by mouth 2 (two) times daily.   felodipine 2.5 MG 24 hr tablet Commonly known as:  PLENDIL TAKE 1 TABLET DAILY   ferrous sulfate 325 (65 FE) MG tablet Take 325 mg by mouth daily with breakfast.   FIBER 7 Powd Take by mouth  daily.   FISH OIL CONCENTRATE 1000 MG Caps Take 2 capsules by mouth 2 (two) times daily.   furosemide 20 MG tablet Commonly known as:  LASIX Take 2 tablets (40 mg total) by mouth daily as needed.   latanoprost 0.005 % ophthalmic solution Commonly known as:  XALATAN Place 1 drop into both eyes at bedtime.   levothyroxine 50 MCG tablet Commonly known as:  SYNTHROID, LEVOTHROID TAKE 1 TABLET DAILY What changed:  See the new instructions.   lidocaine 5 % Commonly known as:  LIDODERM Place 1 patch onto the skin daily. Remove & Discard patch within 12 hours or as directed by MD   LORazepam 0.5 MG tablet Commonly known as:  ATIVAN Take 1 tablet (0.5 mg total) by mouth every 8 (eight) hours as needed for anxiety.   multivitamin capsule Take 1 capsule by mouth every morning.   potassium chloride SA 20 MEQ tablet Commonly known as:  K-DUR,KLOR-CON Take 1 tablet (20 mEq total) by mouth daily. What changed:  when to take this  reasons to take this   sertraline 50 MG tablet Commonly known as:  ZOLOFT Take 1 tablet (50 mg total) by mouth daily.   vitamin B-12 500 MCG tablet Commonly known as:  CYANOCOBALAMIN Take 500 mcg by mouth daily.   warfarin 2.5 MG tablet Commonly known as:  COUMADIN Take 1 tablet daily except 1 1/2 tablets on Mondays and Thursdays What changed:  how much to take  how to take this  when to take this  additional instructions      Follow-up Information    Ria Bush, MD. Schedule an appointment as soon as possible for a visit in 1 week(s).   Specialty:  Family Medicine Contact information: [place] [phone]          Allergies  Allergen Reactions  . Propoxyphene N-Acetaminophen Shortness Of Breath  . Codeine Nausea And Vomiting  . Meperidine Hcl Nausea And Vomiting  . Morphine Nausea And Vomiting  . Shellfish Allergy Hives  . Tramadol Nausea Only     Consultations: None  Procedures/Studies: None  Subjective: Patient was seen and examined at bedside. Patient denied chest pain, shortness of breath, nausea or vomiting. Husband at bedside.   Discharge Exam: Vitals:   07/28/16 0824 07/28/16 1344  BP: (!) 141/95 126/74  Pulse: 84 82  Resp:    Temp:  98.6 F (37 C)   Vitals:   07/28/16 0652 07/28/16 0824 07/28/16 1032 07/28/16 1344  BP: 122/68 (!) 141/95  126/74  Pulse: 77 84  82  Resp: 20     Temp: 98.3 F (36.8 C)   98.6 F (37 C)  TempSrc: Oral   Oral  SpO2: 93%  94% 96%  Weight:      Height:        General: Pleasant elderly female lying in bed, not in distress Cardiovascular: RRR, S1/S2 +, no rubs,  no gallops Respiratory: CTA bilaterally, no wheezing, no rhonchi Abdominal: Soft, NT, ND, bowel sounds + Extremities: no edema, no cyanosis Neurology: Alert, awake, following commands. Nonfocal neurological exam.   The results of significant diagnostics from this hospitalization (including imaging, microbiology, ancillary and laboratory) are listed below for reference.     Microbiology: Recent Results (from the past 240 hour(s))  MRSA PCR Screening     Status: None   Collection Time: 07/27/16  2:34 AM  Result Value Ref Range Status   MRSA by PCR NEGATIVE NEGATIVE Final    Comment:        The GeneXpert MRSA Assay (FDA approved for NASAL specimens only), is one component of a comprehensive MRSA colonization surveillance program. It is not intended to diagnose MRSA infection nor to guide or monitor treatment for MRSA infections.      Labs: BNP (last 3 results)  Recent Labs  12/29/15 0811 07/12/16 1628 07/26/16 2211  BNP 584.0* 385.0* 123XX123*   Basic Metabolic Panel:  Recent Labs Lab 07/26/16 2211 07/27/16 0419 07/28/16 0532  NA 138 140 137  K 2.8* 3.3* 4.0  CL 101 104 107  CO2 29 30 26   GLUCOSE 103* 122* 98  BUN 14 12 15   CREATININE 0.51 0.51 0.43*  CALCIUM 9.6 8.3* 8.2*  MG 1.9  --    --    Liver Function Tests:  Recent Labs Lab 07/26/16 2211  AST 33  ALT 40  ALKPHOS 77  BILITOT 1.1  PROT 7.2  ALBUMIN 3.8   No results for input(s): LIPASE, AMYLASE in the last 168 hours. No results for input(s): AMMONIA in the last 168 hours. CBC:  Recent Labs Lab 07/26/16 2211  WBC 7.4  NEUTROABS 3.2  HGB 13.9  HCT 41.2  MCV 104.3*  PLT 301   Cardiac Enzymes:  Recent Labs Lab 07/26/16 2211  TROPONINI 0.03*   BNP: Invalid input(s): POCBNP CBG: No results for input(s): GLUCAP in the last 168 hours. D-Dimer No results for input(s): DDIMER in the last 72 hours. Hgb A1c No results for input(s): HGBA1C in the last 72 hours. Lipid Profile No results for input(s): CHOL, HDL, LDLCALC, TRIG, CHOLHDL, LDLDIRECT in the last 72 hours. Thyroid function studies  Recent Labs  07/27/16 0419  TSH 2.750   Anemia work up No results for input(s): VITAMINB12, FOLATE, FERRITIN, TIBC, IRON, RETICCTPCT in the last 72 hours. Urinalysis    Component Value Date/Time   COLORURINE YELLOW 07/26/2016 2219   APPEARANCEUR HAZY (A) 07/26/2016 2219   LABSPEC 1.004 (L) 07/26/2016 2219   PHURINE 8.0 07/26/2016 2219   GLUCOSEU NEGATIVE 07/26/2016 2219   HGBUR NEGATIVE 07/26/2016 2219   Lolita NEGATIVE 07/26/2016 2219   KETONESUR NEGATIVE 07/26/2016 2219   PROTEINUR NEGATIVE 07/26/2016 2219   UROBILINOGEN 0.2 12/25/2013 2250   NITRITE NEGATIVE 07/26/2016 2219   LEUKOCYTESUR NEGATIVE 07/26/2016 2219   Sepsis Labs Invalid input(s): PROCALCITONIN,  WBC,  LACTICIDVEN Microbiology Recent Results (from the past 240 hour(s))  MRSA PCR Screening     Status: None   Collection Time: 07/27/16  2:34 AM  Result Value Ref Range Status   MRSA by PCR NEGATIVE NEGATIVE Final    Comment:        The GeneXpert MRSA Assay (FDA approved for NASAL specimens only), is one component of a comprehensive MRSA colonization surveillance program. It is not intended to diagnose MRSA infection  nor to guide or monitor treatment for MRSA infections.      Time coordinating discharge:  Over 30 minutes  SIGNED:   Rosita Fire, MD  [facility] 07/28/2016, 2:18 PM  If 7PM-7AM, please contact night-coverage [email or web address] Password [email or web address]

## 2016-07-28 NOTE — Progress Notes (Signed)
AVS reviewed with patient.  Verbalized understanding of discharge instructions, physician follow-up, medications.  Prescription given to patient.  Patient's IV removed.  Site WNL.  Patient reports belongings intact and in possession at time of discharge.  Patient transported by NT via wheelchair to main entrance for discharge.  Patient stable at time of discharge.

## 2016-07-29 ENCOUNTER — Telehealth: Payer: Self-pay | Admitting: *Deleted

## 2016-07-29 NOTE — Telephone Encounter (Deleted)
Spoke to patient by telephone and was advised that she can not answer the questions and wants her caregiver [name] to call back when she gets there this afternoon. Patient agreed to schedule follow-up appointment 08/03/16 at 11:00 since she wants to come in that day for her TCM follow-up.  Transition Care Management Follow-up Telephone Call   Date discharged?   How have you been since you were released from the hospital?    Do you understand why you were in the hospital?    Do you understand the discharge instructions?    Where were you discharged to?    Items Reviewed:  Medications reviewed:   Allergies reviewed:   Dietary changes reviewed:   Referrals reviewed:    Functional Questionnaire:   Activities of Daily Living (ADLs):        Any transportation issues/concerns?:    Any patient concerns?    Confirmed importance and date/time of follow-up visits scheduled    Confirmed with patient if condition begins to worsen call PCP or go to the ER.  Patient was given the office number and encouraged to call back with question or concerns.

## 2016-07-29 NOTE — Telephone Encounter (Signed)
Transitional call attempted earlier; left v/m requesting cb.

## 2016-07-30 NOTE — Telephone Encounter (Signed)
This Probation officer tried reaching patient [name] at 3:56 and 4:38pm without any answer.  The first attempt resulted in phone being picked up but patient stating in the background that she "didn't want it" and the phone was hung back up without any communication.    Patient is aware of her appointment at 11:00am on 08/03/16 with Dr. Danise Mina.  Will attempt tomorrow to reach patient and check on her.

## 2016-07-30 NOTE — Telephone Encounter (Signed)
Please continue to reach pt to let her know the 30 min appt with Dr Darnell Level is 08/03/16 at 11 AM. Thank you.

## 2016-07-31 DIAGNOSIS — I481 Persistent atrial fibrillation: Secondary | ICD-10-CM | POA: Diagnosis not present

## 2016-07-31 DIAGNOSIS — M48061 Spinal stenosis, lumbar region without neurogenic claudication: Secondary | ICD-10-CM | POA: Diagnosis not present

## 2016-07-31 DIAGNOSIS — I110 Hypertensive heart disease with heart failure: Secondary | ICD-10-CM | POA: Diagnosis not present

## 2016-07-31 DIAGNOSIS — M8008XG Age-related osteoporosis with current pathological fracture, vertebra(e), subsequent encounter for fracture with delayed healing: Secondary | ICD-10-CM | POA: Diagnosis not present

## 2016-07-31 DIAGNOSIS — E119 Type 2 diabetes mellitus without complications: Secondary | ICD-10-CM | POA: Diagnosis not present

## 2016-07-31 DIAGNOSIS — I50810 Right heart failure, unspecified: Secondary | ICD-10-CM | POA: Diagnosis not present

## 2016-08-03 ENCOUNTER — Ambulatory Visit: Payer: Medicare Other | Admitting: Family Medicine

## 2016-08-03 ENCOUNTER — Ambulatory Visit (INDEPENDENT_AMBULATORY_CARE_PROVIDER_SITE_OTHER): Payer: Medicare Other | Admitting: Family Medicine

## 2016-08-03 ENCOUNTER — Encounter: Payer: Self-pay | Admitting: Family Medicine

## 2016-08-03 VITALS — BP 142/80 | HR 68 | Temp 97.4°F | Wt 102.5 lb

## 2016-08-03 DIAGNOSIS — I481 Persistent atrial fibrillation: Secondary | ICD-10-CM | POA: Diagnosis not present

## 2016-08-03 DIAGNOSIS — F418 Other specified anxiety disorders: Secondary | ICD-10-CM

## 2016-08-03 DIAGNOSIS — I10 Essential (primary) hypertension: Secondary | ICD-10-CM | POA: Diagnosis not present

## 2016-08-03 DIAGNOSIS — I4819 Other persistent atrial fibrillation: Secondary | ICD-10-CM

## 2016-08-03 DIAGNOSIS — R918 Other nonspecific abnormal finding of lung field: Secondary | ICD-10-CM

## 2016-08-03 DIAGNOSIS — D7589 Other specified diseases of blood and blood-forming organs: Secondary | ICD-10-CM

## 2016-08-03 DIAGNOSIS — M2011 Hallux valgus (acquired), right foot: Secondary | ICD-10-CM | POA: Diagnosis not present

## 2016-08-03 DIAGNOSIS — M4850XG Collapsed vertebra, not elsewhere classified, site unspecified, subsequent encounter for fracture with delayed healing: Secondary | ICD-10-CM

## 2016-08-03 DIAGNOSIS — IMO0001 Reserved for inherently not codable concepts without codable children: Secondary | ICD-10-CM

## 2016-08-03 DIAGNOSIS — G458 Other transient cerebral ischemic attacks and related syndromes: Secondary | ICD-10-CM

## 2016-08-03 DIAGNOSIS — M2012 Hallux valgus (acquired), left foot: Secondary | ICD-10-CM

## 2016-08-03 DIAGNOSIS — E876 Hypokalemia: Secondary | ICD-10-CM | POA: Diagnosis not present

## 2016-08-03 DIAGNOSIS — Z5181 Encounter for therapeutic drug level monitoring: Secondary | ICD-10-CM | POA: Diagnosis not present

## 2016-08-03 DIAGNOSIS — Z66 Do not resuscitate: Secondary | ICD-10-CM

## 2016-08-03 DIAGNOSIS — I50810 Right heart failure, unspecified: Secondary | ICD-10-CM

## 2016-08-03 DIAGNOSIS — K5900 Constipation, unspecified: Secondary | ICD-10-CM | POA: Diagnosis not present

## 2016-08-03 DIAGNOSIS — R413 Other amnesia: Secondary | ICD-10-CM | POA: Insufficient documentation

## 2016-08-03 DIAGNOSIS — L608 Other nail disorders: Secondary | ICD-10-CM | POA: Diagnosis not present

## 2016-08-03 DIAGNOSIS — R05 Cough: Secondary | ICD-10-CM

## 2016-08-03 DIAGNOSIS — Z639 Problem related to primary support group, unspecified: Secondary | ICD-10-CM

## 2016-08-03 DIAGNOSIS — R059 Cough, unspecified: Secondary | ICD-10-CM | POA: Insufficient documentation

## 2016-08-03 DIAGNOSIS — F410 Panic disorder [episodic paroxysmal anxiety] without agoraphobia: Secondary | ICD-10-CM

## 2016-08-03 LAB — CBC WITH DIFFERENTIAL/PLATELET
BASOS ABS: 0 10*3/uL (ref 0.0–0.1)
Basophils Relative: 0.4 % (ref 0.0–3.0)
EOS PCT: 0.6 % (ref 0.0–5.0)
Eosinophils Absolute: 0.1 10*3/uL (ref 0.0–0.7)
HCT: 42.5 % (ref 36.0–46.0)
Hemoglobin: 14.6 g/dL (ref 12.0–15.0)
LYMPHS ABS: 2.5 10*3/uL (ref 0.7–4.0)
Lymphocytes Relative: 28.9 % (ref 12.0–46.0)
MCHC: 34.3 g/dL (ref 30.0–36.0)
MCV: 102.9 fl — AB (ref 78.0–100.0)
MONO ABS: 0.9 10*3/uL (ref 0.1–1.0)
Monocytes Relative: 10.1 % (ref 3.0–12.0)
NEUTROS ABS: 5.1 10*3/uL (ref 1.4–7.7)
NEUTROS PCT: 60 % (ref 43.0–77.0)
PLATELETS: 423 10*3/uL — AB (ref 150.0–400.0)
RBC: 4.13 Mil/uL (ref 3.87–5.11)
RDW: 16 % — ABNORMAL HIGH (ref 11.5–15.5)
WBC: 8.6 10*3/uL (ref 4.0–10.5)

## 2016-08-03 LAB — BASIC METABOLIC PANEL
BUN: 14 mg/dL (ref 6–23)
CALCIUM: 9.5 mg/dL (ref 8.4–10.5)
CO2: 31 mEq/L (ref 19–32)
CREATININE: 0.57 mg/dL (ref 0.40–1.20)
Chloride: 103 mEq/L (ref 96–112)
GFR: 106.17 mL/min (ref >60.00)
Glucose, Bld: 109 mg/dL — ABNORMAL HIGH (ref 70–99)
Potassium: 4.1 mEq/L (ref 3.5–5.1)
SODIUM: 140 meq/L (ref 135–145)

## 2016-08-03 MED ORDER — AZITHROMYCIN 250 MG PO TABS: ORAL_TABLET | ORAL | 0 refills | Status: DC

## 2016-08-03 MED ORDER — IPRATROPIUM-ALBUTEROL 20-100 MCG/ACT IN AERS: 1.0000 | INHALATION_SPRAY | Freq: Four times a day (QID) | RESPIRATORY_TRACT | 3 refills | Status: DC

## 2016-08-03 MED ORDER — POTASSIUM CHLORIDE CRYS ER 20 MEQ PO TBCR
20.0000 meq | EXTENDED_RELEASE_TABLET | Freq: Every day | ORAL | 0 refills | Status: DC
Start: 2016-08-03 — End: 2016-09-07

## 2016-08-03 MED ORDER — SERTRALINE HCL 50 MG PO TABS
75.0000 mg | ORAL_TABLET | Freq: Every day | ORAL | 2 refills | Status: DC
Start: 2016-08-03 — End: 2016-09-02

## 2016-08-03 MED ORDER — PREDNISONE 20 MG PO TABS: 40.0000 mg | ORAL_TABLET | Freq: Every day | ORAL | 0 refills | Status: DC

## 2016-08-03 NOTE — Assessment & Plan Note (Signed)
Recent CTA showed 71mm LLL subpleural soft tissue mass. rec rpt CT or PET in 3 months - discussed with pt and son.

## 2016-08-03 NOTE — Assessment & Plan Note (Addendum)
Now off all narcotics - only continues lidocaine and tylenol with improved pain

## 2016-08-03 NOTE — Assessment & Plan Note (Signed)
Starting azithromycin - will forward today's note to coumadin clinic RN.

## 2016-08-03 NOTE — Assessment & Plan Note (Addendum)
Update CBC. Consider periph smear and folate if persistent macrocytosis.

## 2016-08-03 NOTE — Assessment & Plan Note (Signed)
Will refer to podiatry for foot care.

## 2016-08-03 NOTE — Assessment & Plan Note (Signed)
-   Continue lasix 20mg daily

## 2016-08-03 NOTE — Assessment & Plan Note (Signed)
Now on potassium tablet daily .

## 2016-08-03 NOTE — Assessment & Plan Note (Signed)
Deteriorated despite sertraline and lorazepam PRN - will increase sertraline to 75mg  daily. Reassess 1 mo f/u visit

## 2016-08-03 NOTE — Patient Instructions (Addendum)
We will set you up with a counselor in [place] and we will refer you to podiatrist (pass by our referral coordinators).  Labwork today.  We will need repeated CT chest towards end of March 2018 to follow nodule/mass seen last month.  Start combivent inhaler to use as needed every 4-6 hours for shortness of breath or wheezing. Treat current breathing flare with short prednisone course and zpack.  increase sertraline to 75mg  daily.  Return in 1 month for follow up visit.  Call coumadin clinic to notify them we're starting zpack

## 2016-08-03 NOTE — Progress Notes (Signed)
BP (!) 142/80   Pulse 68 Comment: Irregular  Temp 97.4 F (36.3 C) (Oral)   Wt 102 lb 8 oz (46.5 kg)   BMI 17.06 kg/m    CC: hosp f/u Subjective:    Patient ID: [name], female    DOB: [birth year], 83 y.o.   MRN: [id number]  HPI: [name] is a 83 y.o. female presenting on 08/03/2016 for Follow-up (hospital)   Here with son [name].   Recent hospitalization earlier this month due to malaise, myalgia, generalized weakness with hypotension found to have low potassium level. Discharged with home health care.   Back pain is significantly improved - off oxycontin, just using lidocaine patch and tylenol.   Noticing panic attacks worse at night despite sertraline 50mg  daily.   Having one regular stool a day.   Requests podiatrist referral for elongated nails.   Cough productive over last few weeks along with fever to 101 a week ago. Ongoing nasal congestion as well. Some wheezing noted as well.    Admit date: 07/26/2016 Discharge date: 07/28/2016 [facility] f/u phone call attempted several times unsuccessfully (see chart)  Admitted From:Home Disposition: Home with home care    Recommendations for Outpatient Follow-up:  1. Follow up with PCP in 1-2 weeks 2. Please obtain BMP/CBC in one week  Home Health: Yes Equipment/Devices: No Discharge Condition: Stable CODE STATUS: DO NOT RESUSCITATE Diet recommendation: Heart healthy  Relevant past medical, surgical, family and social history reviewed and updated as indicated. Interim medical history since our last visit reviewed. Allergies and medications reviewed and updated. Current Outpatient Prescriptions on File Prior to Visit  Medication Sig  . acetaminophen (TYLENOL) 500 MG tablet Take 2 tablets (1,000 mg total) by mouth 3 (three) times daily.  . calcitonin, salmon, (MIACALCIN/FORTICAL) 200 UNIT/ACT nasal spray Place 1 spray into alternate nostrils daily.  . Calcium-Magnesium-Vitamin D [id number] MG-MG-UNIT TABS  Take 1 tablet by mouth 2 (two) times daily.  . felodipine (PLENDIL) 2.5 MG 24 hr tablet TAKE 1 TABLET DAILY  . ferrous sulfate 325 (65 FE) MG tablet Take 325 mg by mouth daily with breakfast.  . furosemide (LASIX) 20 MG tablet Take 2 tablets (40 mg total) by mouth daily as needed.  . latanoprost (XALATAN) 0.005 % ophthalmic solution Place 1 drop into both eyes at bedtime.   Marland Kitchen levothyroxine (SYNTHROID, LEVOTHROID) 50 MCG tablet TAKE 1 TABLET DAILY  . lidocaine (LIDODERM) 5 % Place 1 patch onto the skin daily. Remove & Discard patch within 12 hours or as directed by MD  . LORazepam (ATIVAN) 0.5 MG tablet Take 1 tablet (0.5 mg total) by mouth every 8 (eight) hours as needed for anxiety.  . Misc Natural Products (FIBER 7) POWD Take by mouth daily.  . Multiple Vitamin (MULTIVITAMIN) capsule Take 1 capsule by mouth every morning.   . Omega-3 Fatty Acids (FISH OIL CONCENTRATE) 1000 MG CAPS Take 2 capsules by mouth 2 (two) times daily.    . vitamin B-12 (CYANOCOBALAMIN) 500 MCG tablet Take 500 mcg by mouth daily.  Marland Kitchen warfarin (COUMADIN) 2.5 MG tablet Take 1 tablet daily except 1 1/2 tablets on Mondays and Thursdays (Patient taking differently: Take 3.75 mg by mouth every evening. )  . [DISCONTINUED] Calcium Carbonate-Vitamin D (CALCIUM PLUS VITAMIN D PO) Take 2 tablets by mouth daily.     No current facility-administered medications on file prior to visit.     Review of Systems Per HPI unless specifically indicated in ROS section  Objective:    BP (!) 142/80   Pulse 68 Comment: Irregular  Temp 97.4 F (36.3 C) (Oral)   Wt 102 lb 8 oz (46.5 kg)   BMI 17.06 kg/m   Wt Readings from Last 3 Encounters:  08/03/16 102 lb 8 oz (46.5 kg)  07/27/16 102 lb 1.2 oz (46.3 kg)  07/21/16 104 lb (47.2 kg)    Physical Exam  Constitutional: She appears well-developed and well-nourished. No distress.  Cardiovascular: Normal rate, regular rhythm, normal heart sounds and intact distal pulses.   No murmur  heard. Pulmonary/Chest: Effort normal. No respiratory distress. She has decreased breath sounds. She has wheezes (faint). She has rhonchi. She has no rales.  Musculoskeletal: She exhibits no edema.  Foot exam - 2DP bilaterally, elongated thickened nails throughout, hallux valgus deformity bilaterally, skin maceration between toes  Skin: Skin is warm and dry. No rash noted.  Nursing note and vitals reviewed.  Results for orders placed or performed in visit on 08/03/16  CBC with Differential/Platelet  Result Value Ref Range   WBC 8.6 4.0 - 10.5 K/uL   RBC 4.13 3.87 - 5.11 Mil/uL   Hemoglobin 14.6 12.0 - 15.0 g/dL   HCT 42.5 36.0 - 46.0 %   MCV 102.9 (H) 78.0 - 100.0 fl   MCHC 34.3 30.0 - 36.0 g/dL   RDW 16.0 (H) 11.5 - 15.5 %   Platelets 423.0 (H) 150.0 - 400.0 K/uL   Neutrophils Relative % 60.0 43.0 - 77.0 %   Lymphocytes Relative 28.9 12.0 - 46.0 %   Monocytes Relative 10.1 3.0 - 12.0 %   Eosinophils Relative 0.6 0.0 - 5.0 %   Basophils Relative 0.4 0.0 - 3.0 %   Neutro Abs 5.1 1.4 - 7.7 K/uL   Lymphs Abs 2.5 0.7 - 4.0 K/uL   Monocytes Absolute 0.9 0.1 - 1.0 K/uL   Eosinophils Absolute 0.1 0.0 - 0.7 K/uL   Basophils Absolute 0.0 0.0 - 0.1 K/uL  Basic metabolic panel  Result Value Ref Range   Sodium 140 135 - 145 mEq/L   Potassium 4.1 3.5 - 5.1 mEq/L   Chloride 103 96 - 112 mEq/L   CO2 31 19 - 32 mEq/L   Glucose, Bld 109 (H) 70 - 99 mg/dL   BUN 14 6 - 23 mg/dL   Creatinine, Ser 0.57 0.40 - 1.20 mg/dL   Calcium 9.5 8.4 - 10.5 mg/dL   GFR 106.17 >60.00 mL/min    CTA chest 07/12/2016: 11 mm left lower lobe subpleural soft tissue mass. Consider one of the following in 3 months for both low-risk and high-risk individuals: (a) repeat chest CT, (b) follow-up PET-CT, or (c) tissue sampling. This recommendation follows the consensus statement: Guidelines for Management of Incidental Pulmonary Nodules Detected on CT Images: From the Fleischner Society 2017; Radiology 2017;  284:228-243.    Assessment & Plan:   Problem List Items Addressed This Visit    Acquired deformity of nail    Will refer to podiatry for foot care.       Relevant Orders   Ambulatory referral to Podiatry   Anxiety attack    Deteriorated despite sertraline and lorazepam PRN - will increase sertraline to 75mg  daily. Reassess 1 mo f/u visit      Relevant Medications   sertraline (ZOLOFT) 50 MG tablet   Other Relevant Orders   Ambulatory referral to Psychology   Compression fracture of vertebrae (HCC)    Slow heal, progressed - pain now managed off narcotic, only  on tylenol and lidocaine patches      Constipation    Stable off regular bowel regimen besides fiber powder      Cough    Ongoing productive cough over weeks, progressively worsening.  H/o chronic lung disease ?COPD - anticipate flare. Will treat with short prednisone course as well as zpack antibiotic.  I will send today's note attn coumadin clinic RN to see if any coumadin changes are warranted as we're starting azithromycin.       Depression with anxiety - Primary    Deteriorated - previously thought situational stemming from chronic pain from stress fractures - however depression/anxiety has persisted despite improved pain. Will increase sertraline to 75mg  daily. Will refer to counseling for assistance as well - pt and son are in agreement. Significant stressors and concerns - financial, family, decreased functionality, medical deterioration, and isolation. F/u 1 mo.       Relevant Orders   Ambulatory referral to Psychology   Difficulty with family    Ongoing stress - pt and husband with some memory trouble.       DNR (do not resuscitate)   Encounter for therapeutic drug monitoring    Now off all narcotics - only continues lidocaine and tylenol with improved pain      Essential hypertension, benign    Mildly elevated, adequate.       Hypokalemia    Now on potassium tablet daily .      Relevant Orders     Basic metabolic panel (Completed)   Macrocytosis    Update CBC. Consider periph smear and folate if persistent macrocytosis.       Relevant Orders   CBC with Differential/Platelet (Completed)   Memory deficit    ?pseudodementia - check geriatric assessment next visit in 1 month.       Persistent atrial fibrillation (HCC)    Starting azithromycin - will forward today's note to coumadin clinic RN.      Pulmonary nodules    Recent CTA showed 75mm LLL subpleural soft tissue mass. rec rpt CT or PET in 3 months - discussed with pt and son.       Right heart failure, NYHA class 3    Continue lasix 20mg  daily.       TIA (transient ischemic attack)    Other Visit Diagnoses    Hallux valgus, acquired, bilateral       Relevant Orders   Ambulatory referral to Podiatry       Follow up plan: Return in about 4 weeks (around 08/31/2016) for follow up visit.  Ria Bush, MD

## 2016-08-03 NOTE — Assessment & Plan Note (Addendum)
?  pseudodementia - check geriatric assessment next visit in 1 month.

## 2016-08-03 NOTE — Assessment & Plan Note (Signed)
Stable off regular bowel regimen besides fiber powder

## 2016-08-03 NOTE — Progress Notes (Signed)
Pre visit review using our clinic review tool, if applicable. No additional management support is needed unless otherwise documented below in the visit note. 

## 2016-08-03 NOTE — Assessment & Plan Note (Signed)
Deteriorated - previously thought situational stemming from chronic pain from stress fractures - however depression/anxiety has persisted despite improved pain. Will increase sertraline to 75mg  daily. Will refer to counseling for assistance as well - pt and son are in agreement. Significant stressors and concerns - financial, family, decreased functionality, medical deterioration, and isolation. F/u 1 mo.

## 2016-08-03 NOTE — Assessment & Plan Note (Signed)
Ongoing stress - pt and husband with some memory trouble.

## 2016-08-03 NOTE — Assessment & Plan Note (Signed)
Ongoing productive cough over weeks, progressively worsening.  H/o chronic lung disease ?COPD - anticipate flare. Will treat with short prednisone course as well as zpack antibiotic.  I will send today's note attn coumadin clinic RN to see if any coumadin changes are warranted as we're starting azithromycin.

## 2016-08-03 NOTE — Assessment & Plan Note (Deleted)
Starting azithromycin - will forward today's note to coumadin clinic RN.

## 2016-08-03 NOTE — Assessment & Plan Note (Signed)
Mildly elevated, adequate.

## 2016-08-03 NOTE — Assessment & Plan Note (Signed)
Slow heal, progressed - pain now managed off narcotic, only on tylenol and lidocaine patches

## 2016-08-04 NOTE — Progress Notes (Signed)
Called pt at home. No answer.  LMOM for pt to decrease coumadin to 1 tablet daily (2.5mg ) while she is on Zpack and Prednisone taper.  Will continue to get in touch with pt or she is to call me.  Will get Home Health to check INR on Thursday 08/06/16.

## 2016-08-07 ENCOUNTER — Ambulatory Visit (INDEPENDENT_AMBULATORY_CARE_PROVIDER_SITE_OTHER): Payer: Medicare Other | Admitting: *Deleted

## 2016-08-07 ENCOUNTER — Encounter: Payer: Self-pay | Admitting: *Deleted

## 2016-08-07 ENCOUNTER — Telehealth: Payer: Self-pay | Admitting: *Deleted

## 2016-08-07 DIAGNOSIS — I50810 Right heart failure, unspecified: Secondary | ICD-10-CM | POA: Diagnosis not present

## 2016-08-07 DIAGNOSIS — I481 Persistent atrial fibrillation: Secondary | ICD-10-CM

## 2016-08-07 DIAGNOSIS — Z5181 Encounter for therapeutic drug level monitoring: Secondary | ICD-10-CM

## 2016-08-07 DIAGNOSIS — E119 Type 2 diabetes mellitus without complications: Secondary | ICD-10-CM | POA: Diagnosis not present

## 2016-08-07 DIAGNOSIS — M48061 Spinal stenosis, lumbar region without neurogenic claudication: Secondary | ICD-10-CM | POA: Diagnosis not present

## 2016-08-07 DIAGNOSIS — I4819 Other persistent atrial fibrillation: Secondary | ICD-10-CM

## 2016-08-07 DIAGNOSIS — G458 Other transient cerebral ischemic attacks and related syndromes: Secondary | ICD-10-CM

## 2016-08-07 DIAGNOSIS — I110 Hypertensive heart disease with heart failure: Secondary | ICD-10-CM | POA: Diagnosis not present

## 2016-08-07 DIAGNOSIS — M8008XG Age-related osteoporosis with current pathological fracture, vertebra(e), subsequent encounter for fracture with delayed healing: Secondary | ICD-10-CM | POA: Diagnosis not present

## 2016-08-07 LAB — POCT INR: INR: 1.5

## 2016-08-07 NOTE — Telephone Encounter (Signed)
Done.  See coumadin note. 

## 2016-08-07 NOTE — Telephone Encounter (Signed)
INR 1.5  Per Geraldine Solar w/ [facility] [phone]

## 2016-08-10 ENCOUNTER — Ambulatory Visit: Payer: Medicare Other | Admitting: Family Medicine

## 2016-08-12 NOTE — Telephone Encounter (Signed)
Please sign encounter if completed. 

## 2016-08-17 ENCOUNTER — Ambulatory Visit (INDEPENDENT_AMBULATORY_CARE_PROVIDER_SITE_OTHER): Payer: Medicare Other | Admitting: *Deleted

## 2016-08-17 DIAGNOSIS — I4819 Other persistent atrial fibrillation: Secondary | ICD-10-CM

## 2016-08-17 DIAGNOSIS — G458 Other transient cerebral ischemic attacks and related syndromes: Secondary | ICD-10-CM | POA: Diagnosis not present

## 2016-08-17 DIAGNOSIS — Z5181 Encounter for therapeutic drug level monitoring: Secondary | ICD-10-CM | POA: Diagnosis not present

## 2016-08-17 DIAGNOSIS — I481 Persistent atrial fibrillation: Secondary | ICD-10-CM

## 2016-08-17 LAB — POCT INR: INR: 2.1

## 2016-08-18 ENCOUNTER — Encounter: Payer: Self-pay | Admitting: Cardiology

## 2016-08-18 NOTE — Progress Notes (Signed)
Cardiology Office Note  Date: 08/19/2016   ID: DAURICE SHADDIX, DOB 10-24-1925, MRN [id number]  PCP: Ria Bush, MD  Primary Cardiologist: Rozann Lesches, MD   Chief Complaint  Patient presents with  . Atrial Fibrillation    History of Present Illness: [name] is an 83 y.o. female last seen in May 2017. She presents for a routine follow-up visit. Has steadily declined in terms of her stamina and general health over the last year. Records indicate admission to the hospital in early January with hypokalemia and deconditioning. Home health services were arranged at that time. She states that she is doing better, uses a rolling walker. Has had no falls.  She continues to follow in the anticoagulation clinic on Coumadin. Recent INR was therapeutic. She reports no bleeding. Also no sense of palpitations. She has not required any heart rate control medication.  Past Medical History:  Diagnosis Date  . Anemia   . Anxiety   . Aortic sclerosis   . Arthritis   . Chronic atrial fibrillation ([place])   . Chronic gastritis 10/2009   Duodenitis on EGD  . CKD (chronic kidney disease) stage 2, GFR 60-89 ml/min   . Collagen vascular disease ([place])   . COPD (chronic obstructive pulmonary disease) ([place])   . Diabetes type 2, controlled ([place])    Diet controlled  . Diverticulosis   . Dry ARMD    Retinal hemorrhage (09/2013) [place]  . Essential hypertension, benign   . GERD (gastroesophageal reflux disease)   . Glaucoma    Dr. Venetia Maxon  . Hiatal hernia 2011   Small  . History of pelvic fracture April 2013  . History of rheumatic fever   . History of skin cancer   . History of TIA (transient ischemic attack)   . Hypothyroidism   . Macular degeneration   . Mixed hyperlipidemia   . Osteoporosis 04/2013   Thoracic compression fracture, on bisphosphonate and cal/vit D  . Pulmonary nodules    Stable bilateral on CT 01/2010, rec rpt yearly for 2 yrs, pt decided to stop f/u  .  Seasonal allergies   . Vertebral compression fracture ([place]) 05/2016   Vertebroplasty L2/L3    Past Surgical History:  Procedure Laterality Date  . BREAST LUMPECTOMY  1983   LEFT, benign  . Carotid US  2011   mild plaque formation  . CATARACT EXTRACTION  2012   RIGHT  . CESAREAN SECTION     (and h/o 6 miscarriages)  . CHOLECYSTECTOMY N/A 02/27/2013   Procedure: LAPAROSCOPIC CHOLECYSTECTOMY;  Surgeon: Donato Heinz, MD;  Location: [facility];  Service: General;  Laterality: N/A;  . COLONOSCOPY  03/2010   int hemorrhoids, diverticulosis, no need to repeat (Dr. Sydell Axon)  . DEXA  05/2010   T score -4.5 spine, -2.4 femur; does not want to repeat  . DEXA  04/2013   T score 3.9 [facility] spine, 2.6 hip  . DILATION AND CURETTAGE OF UTERUS     x2  . ESOPHAGOGASTRODUODENOSCOPY  10/2009   small HH, multiple antric ulcerations s/p biopsy, mild chronic gastritis/duodenitis  . ESOPHAGOGASTRODUODENOSCOPY  05/26/2012   RMR: small HH, o/w normal.   . IR GENERIC HISTORICAL  04/16/2016   IR RADIOLOGIST EVAL & MGMT 04/16/2016 [facility]  . IR GENERIC HISTORICAL  06/16/2016   IR VERTEBROPLASTY LUMBAR BX INC UNI/BIL INC/INJECT/IMAGING 06/16/2016 Luanne Bras, MD [facility]  . IR GENERIC HISTORICAL  06/16/2016   IR VERTEBROPLASTY EA ADDL (T&LS) BX INC UNI/BIL  INC INJECT/IMAGING 06/16/2016 Luanne Bras, MD [facility]  . LAPAROSCOPIC CHOLECYSTECTOMY  02/2013   Dr. Geroge Baseman  . PATELLA FRACTURE SURGERY  05/2006   LEFT surgically repaired with pins and wire  . TONSILLECTOMY      Current Outpatient Prescriptions  Medication Sig Dispense Refill  . acetaminophen (TYLENOL) 500 MG tablet Take 2 tablets (1,000 mg total) by mouth 3 (three) times daily.    Marland Kitchen azithromycin (ZITHROMAX) 250 MG tablet Take two tablets on day one followed by one tablet on days 2-5 6 each 0  . calcitonin, salmon, (MIACALCIN/FORTICAL) 200 UNIT/ACT nasal spray Place 1 spray into alternate nostrils daily. 3.7 mL 12  .  Calcium-Magnesium-Vitamin D [id number] MG-MG-UNIT TABS Take 1 tablet by mouth 2 (two) times daily.    . felodipine (PLENDIL) 2.5 MG 24 hr tablet TAKE 1 TABLET DAILY 90 tablet 1  . ferrous sulfate 325 (65 FE) MG tablet Take 325 mg by mouth daily with breakfast.    . furosemide (LASIX) 20 MG tablet Take 2 tablets (40 mg total) by mouth daily as needed. 180 tablet 0  . Ipratropium-Albuterol (COMBIVENT RESPIMAT) 20-100 MCG/ACT AERS respimat Inhale 1 puff into the lungs every 6 (six) hours. 4 g 3  . latanoprost (XALATAN) 0.005 % ophthalmic solution Place 1 drop into both eyes at bedtime.     Marland Kitchen levothyroxine (SYNTHROID, LEVOTHROID) 50 MCG tablet TAKE 1 TABLET DAILY 90 tablet 3  . lidocaine (LIDODERM) 5 % Place 1 patch onto the skin daily. Remove & Discard patch within 12 hours or as directed by MD 30 patch 6  . LORazepam (ATIVAN) 0.5 MG tablet Take 1 tablet (0.5 mg total) by mouth every 8 (eight) hours as needed for anxiety. 60 tablet 0  . Misc Natural Products (FIBER 7) POWD Take by mouth daily.    . Multiple Vitamin (MULTIVITAMIN) capsule Take 1 capsule by mouth every morning.     . Omega-3 Fatty Acids (FISH OIL CONCENTRATE) 1000 MG CAPS Take 2 capsules by mouth 2 (two) times daily.      . potassium chloride SA (K-DUR,KLOR-CON) 20 MEQ tablet Take 1 tablet (20 mEq total) by mouth daily. 30 tablet 0  . predniSONE (DELTASONE) 20 MG tablet Take 2 tablets (40 mg total) by mouth daily with breakfast. 10 tablet 0  . sertraline (ZOLOFT) 50 MG tablet Take 1.5 tablets (75 mg total) by mouth daily. 90 tablet 2  . vitamin B-12 (CYANOCOBALAMIN) 500 MCG tablet Take 500 mcg by mouth daily.    Marland Kitchen warfarin (COUMADIN) 2.5 MG tablet Take 1 tablet daily except 1 1/2 tablets on Mondays and Thursdays (Patient taking differently: Take 3.75 mg by mouth every evening. ) 180 tablet 3   No current facility-administered medications for this visit.    Allergies:  Propoxyphene n-acetaminophen; Codeine; Meperidine hcl; Morphine;  Shellfish allergy; and Tramadol   Social History: The patient  reports that she quit smoking about 17 years ago. Her smoking use included Cigarettes. She has a 20.00 pack-year smoking history. She has never used smokeless tobacco. She reports that she does not drink alcohol or use drugs.   ROS:  Please see the history of present illness. Otherwise, complete review of systems is positive for arthritic stiffness, hearing loss.  All other systems are reviewed and negative.   Physical Exam: VS:  BP 138/88   Pulse 82   Ht 5\' 6"  (1.676 m)   Wt 106 lb (48.1 kg)   SpO2 92%   BMI 17.11 kg/m , BMI  Body mass index is 17.11 kg/m.  Wt Readings from Last 3 Encounters:  08/19/16 106 lb (48.1 kg)  08/03/16 102 lb 8 oz (46.5 kg)  07/27/16 102 lb 1.2 oz (46.3 kg)    General: Frail-appearing elderly woman, no distress. Using a rolling walker. HEENT: Conjunctiva and lids normal, oropharynx clear. Neck: Supple, no elevated JVP or carotid bruits, no thyromegaly. Lungs: Clear to auscultation, nonlabored breathing at rest. Cardiac: Irregularly irregular, no S3, soft systolic murmur, no pericardial rub. Abdomen: Soft, nontender, bowel sounds present. Extremities: No pitting edema, distal pulses 2+.  ECG: I personally reviewed the tracing from 07/26/2016 which showed atrial fibrillation with poor anterior R-wave progression.  Recent Labwork: 07/26/2016: ALT 40; AST 33; B Natriuretic Peptide 364.0; Magnesium 1.9 07/27/2016: TSH 2.750 08/03/2016: BUN 14; Creatinine, Ser 0.57; Hemoglobin 14.6; Platelets 423.0; Potassium 4.1; Sodium 140     Component Value Date/Time   CHOL 174 08/01/2015 0845   TRIG 76.0 08/01/2015 0845   TRIG 91 04/14/2011   HDL 49.00 08/01/2015 0845   CHOLHDL 4 08/01/2015 0845   VLDL 15.2 08/01/2015 0845   LDLCALC 109 (H) 08/01/2015 0845   LDLDIRECT 136.3 04/12/2013 0930    Other Studies Reviewed Today:  Echocardiogram 11/29/2015: Study Conclusions  - Left ventricle: The cavity size  was normal. Wall thickness was   increased in a pattern of mild LVH. Systolic function was normal.   The estimated ejection fraction was in the range of 55% to 60%.   Wall motion was normal; there were no regional wall motion   abnormalities. - Aortic valve: Moderately calcified annulus. Trileaflet;   moderately thickened leaflets. There was mild regurgitation.   Valve area (VTI): 2.52 cm^2. Valve area (Vmax): 2.37 cm^2. - Mitral valve: Mildly to moderately calcified annulus. Mildly   thickened leaflets . There was mild regurgitation. - Left atrium: The atrium was severely dilated. - Right ventricle: The cavity size was moderately dilated. Systolic   function was mildly to moderately reduced. - Right atrium: The atrium was severely dilated. - Atrial septum: There was a patent foramen ovale. There is   evidence of left to right shunt by color Doppler. - Tricuspid valve: There was mild-moderate regurgitation. - Pulmonary arteries: Systolic pressure was moderately to severely   increased. PA peak pressure: 68 mm Hg (S). - Inferior vena cava: The vessel was dilated. The respirophasic   diameter changes were blunted (< 50%), consistent with elevated   central venous pressure. - Technically adequate study.  Assessment and Plan:  1. Chronic atrial fibrillation. Plan at this point is to continue Coumadin with follow-up in the anticoagulation clinic. She is not requiring any heart rate control medications at this time.  2. Essential hypertension, blood pressure control is adequate today. Medical regimen includes Lasix and potassium supplements as well as Plendil. She has concurrent diastolic dysfunction.  Current medicines were reviewed with the patient today.  Disposition: Follow-up in 6 months.  Signed, Satira Sark, MD, [facility] 08/19/2016 4:08 PM    [facility] [address] [place], [place] [address] Phone: [phone]; Fax: [phone]

## 2016-08-19 ENCOUNTER — Ambulatory Visit (INDEPENDENT_AMBULATORY_CARE_PROVIDER_SITE_OTHER): Payer: Medicare Other | Admitting: Cardiology

## 2016-08-19 ENCOUNTER — Encounter: Payer: Self-pay | Admitting: Cardiology

## 2016-08-19 VITALS — BP 138/88 | HR 82 | Ht 66.0 in | Wt 106.0 lb

## 2016-08-19 DIAGNOSIS — I10 Essential (primary) hypertension: Secondary | ICD-10-CM

## 2016-08-19 DIAGNOSIS — I482 Chronic atrial fibrillation, unspecified: Secondary | ICD-10-CM

## 2016-08-19 NOTE — Patient Instructions (Signed)

## 2016-09-01 ENCOUNTER — Telehealth: Payer: Self-pay

## 2016-09-01 NOTE — Telephone Encounter (Signed)
Attempted to reach pt to schedule AWV on 09/02/16 @ 11:15AM. Note: pt has f/u appt with PCP @ 10:15AM.

## 2016-09-01 NOTE — Telephone Encounter (Signed)
Spoke with son Leane Para who agreed to AWV appt.

## 2016-09-02 ENCOUNTER — Ambulatory Visit (INDEPENDENT_AMBULATORY_CARE_PROVIDER_SITE_OTHER): Payer: Medicare Other | Admitting: Family Medicine

## 2016-09-02 ENCOUNTER — Encounter: Payer: Self-pay | Admitting: Family Medicine

## 2016-09-02 ENCOUNTER — Ambulatory Visit (INDEPENDENT_AMBULATORY_CARE_PROVIDER_SITE_OTHER): Payer: Medicare Other

## 2016-09-02 VITALS — BP 132/82 | HR 94 | Temp 97.7°F | Wt 113.5 lb

## 2016-09-02 VITALS — BP 132/82 | HR 80 | Temp 97.7°F | Wt 113.5 lb

## 2016-09-02 DIAGNOSIS — Z5181 Encounter for therapeutic drug level monitoring: Secondary | ICD-10-CM | POA: Diagnosis not present

## 2016-09-02 DIAGNOSIS — I50810 Right heart failure, unspecified: Secondary | ICD-10-CM

## 2016-09-02 DIAGNOSIS — M791 Myalgia: Secondary | ICD-10-CM | POA: Diagnosis not present

## 2016-09-02 DIAGNOSIS — R413 Other amnesia: Secondary | ICD-10-CM | POA: Diagnosis not present

## 2016-09-02 DIAGNOSIS — H903 Sensorineural hearing loss, bilateral: Secondary | ICD-10-CM | POA: Insufficient documentation

## 2016-09-02 DIAGNOSIS — M7918 Myalgia, other site: Secondary | ICD-10-CM | POA: Insufficient documentation

## 2016-09-02 DIAGNOSIS — R918 Other nonspecific abnormal finding of lung field: Secondary | ICD-10-CM

## 2016-09-02 DIAGNOSIS — E119 Type 2 diabetes mellitus without complications: Secondary | ICD-10-CM

## 2016-09-02 DIAGNOSIS — F418 Other specified anxiety disorders: Secondary | ICD-10-CM

## 2016-09-02 DIAGNOSIS — Z0000 Encounter for general adult medical examination without abnormal findings: Secondary | ICD-10-CM

## 2016-09-02 DIAGNOSIS — H9193 Unspecified hearing loss, bilateral: Secondary | ICD-10-CM

## 2016-09-02 MED ORDER — SERTRALINE HCL 100 MG PO TABS: 100.0000 mg | ORAL_TABLET | Freq: Every day | ORAL | 1 refills | Status: AC

## 2016-09-02 NOTE — Assessment & Plan Note (Signed)
Anticipate has improved to prediabetes range. Will check A1c next labwork.

## 2016-09-02 NOTE — Assessment & Plan Note (Addendum)
Failed hearing screen with our health advisor today - will refer to audiology - pt and son agree.

## 2016-09-02 NOTE — Assessment & Plan Note (Signed)
With recent weight gain noted - encouraged she take lasix 20mg  daily x 3 days.

## 2016-09-02 NOTE — Patient Instructions (Signed)
Ms. Strader , Thank you for taking time to come for your Medicare Wellness Visit. I appreciate your ongoing commitment to your health goals. Please review the following plan we discussed and let me know if I can assist you in the future.   These are the goals we discussed: Goals    . Increase physical activity          Starting 09/02/16, I will continue to exercise at least 30-45 min daily.        This is a list of the screening recommended for you and due dates:  Health Maintenance  Topic Date Due  . Hemoglobin A1C  03/16/2017*  . Mammogram  06/04/2017*  . DTaP/Tdap/Td vaccine (2 - Td) 12/02/2023  . Tetanus Vaccine  12/02/2023  . Flu Shot  Completed  . DEXA scan (bone density measurement)  Completed  . Shingles Vaccine  Completed  . Pneumonia vaccines  Completed  *Topic was postponed. The date shown is not the original due date.   Preventive Care for Adults  A healthy lifestyle and preventive care can promote health and wellness. Preventive health guidelines for adults include the following key practices.  . A routine yearly physical is a good way to check with your health care provider about your health and preventive screening. It is a chance to share any concerns and updates on your health and to receive a thorough exam.  . Visit your dentist for a routine exam and preventive care every 6 months. Brush your teeth twice a day and floss once a day. Good oral hygiene prevents tooth decay and gum disease.  . The frequency of eye exams is based on your age, health, family medical history, use  of contact lenses, and other factors. Follow your health care provider's ecommendations for frequency of eye exams.  . Eat a healthy diet. Foods like vegetables, fruits, whole grains, low-fat dairy products, and lean protein foods contain the nutrients you need without too many calories. Decrease your intake of foods high in solid fats, added sugars, and salt. Eat the right amount of  calories for you. Get information about a proper diet from your health care provider, if necessary.  . Regular physical exercise is one of the most important things you can do for your health. Most adults should get at least 150 minutes of moderate-intensity exercise (any activity that increases your heart rate and causes you to sweat) each week. In addition, most adults need muscle-strengthening exercises on 2 or more days a week.  Silver Sneakers may be a benefit available to you. To determine eligibility, you may visit the website: www.silversneakers.com or contact program at 234-390-9473 Mon-Fri between 8AM-8PM.   . Maintain a healthy weight. The body mass index (BMI) is a screening tool to identify possible weight problems. It provides an estimate of body fat based on height and weight. Your health care provider can find your BMI and can help you achieve or maintain a healthy weight.   For adults 20 years and older: ? A BMI below 18.5 is considered underweight. ? A BMI of 18.5 to 24.9 is normal. ? A BMI of 25 to 29.9 is considered overweight. ? A BMI of 30 and above is considered obese.   . Maintain normal blood lipids and cholesterol levels by exercising and minimizing your intake of saturated fat. Eat a balanced diet with plenty of fruit and vegetables. Blood tests for lipids and cholesterol should begin at age 31 and be repeated every 5  years. If your lipid or cholesterol levels are high, you are over 50, or you are at high risk for heart disease, you may need your cholesterol levels checked more frequently. Ongoing high lipid and cholesterol levels should be treated with medicines if diet and exercise are not working.  . If you smoke, find out from your health care provider how to quit. If you do not use tobacco, please do not start.  . If you choose to drink alcohol, please do not consume more than 2 drinks per day. One drink is considered to be 12 ounces (355 mL) of beer, 5 ounces  (148 mL) of wine, or 1.5 ounces (44 mL) of liquor.  . If you are 22-17 years old, ask your health care provider if you should take aspirin to prevent strokes.  . Use sunscreen. Apply sunscreen liberally and repeatedly throughout the day. You should seek shade when your shadow is shorter than you. Protect yourself by wearing long sleeves, pants, a wide-brimmed hat, and sunglasses year round, whenever you are outdoors.  . Once a month, do a whole body skin exam, using a mirror to look at the skin on your back. Tell your health care provider of new moles, moles that have irregular borders, moles that are larger than a pencil eraser, or moles that have changed in shape or color.

## 2016-09-02 NOTE — Progress Notes (Signed)
I reviewed health advisor's note, was available for consultation, and agree with documentation and plan.  

## 2016-09-02 NOTE — Progress Notes (Signed)
PCP notes:   Health maintenance:  A1C - will be completed with future labs Mammogram - pt declined; asked patient to discuss necessity of screening with PCP at next appt  Abnormal screenings:   Hearing - failed Mini-Cog score: 17/20 Depression score: 19  Patient concerns:   None  Nurse concerns:  None  Next PCP appt:   10/30/16 @ 1115

## 2016-09-02 NOTE — Assessment & Plan Note (Addendum)
Previously thought pseudodementia - however no significant improvement with sertraline 75mg .  Mini-cog today 17/20. Will do MMSE next visit.  Discussed possibly adding aricept.

## 2016-09-02 NOTE — Assessment & Plan Note (Signed)
Anticipate L sciatica - exercises provided today.

## 2016-09-02 NOTE — Patient Instructions (Addendum)
Increase sertraline to 100mg  daily - new dose sent to pharmacy. You can take 2 tablets of 50mg  until you run out.  See Rosaria Ferries on your way out today to schedule counseling appointment in [place] if possible.  Hearing screen today - if failed, we will refer you to hearing doctor to discuss hearing aides.  Memory testing today - we may discuss extra medicine to help with memory.  Try exercises for left sided sciatica.  Return to see me in 2 months.

## 2016-09-02 NOTE — Progress Notes (Signed)
BP 132/82   Pulse 80   Temp 97.7 F (36.5 C) (Oral)   Wt 113 lb 8 oz (51.5 kg)   BMI 18.32 kg/m    CC: 1 mo f/u visit Subjective:    Patient ID: [name], female    DOB: [birth year], 83 y.o.   MRN: [id number]  HPI: [name] is a 83 y.o. female presenting on 09/02/2016 for Annual Exam   Here with son [name].   To see [name] for medicare wellness visit later today.  Saw cardiology late last month, note reviewed. On coumadin for chronic afib. BP well controlled on lasix, potassium and plendil. H/o diastolic dysfunction.   See prior note for details - off oxycontin, just using lidocaine patches and tylenol. Ongoing depression and panic attacks so we increased sertraline to 75mg  daily. I also recommended counseling - she has not been contacted yet to schedule this. Persistent anxiety although a bit improved. She continues lorazepam PRN.   Denies diarrhea/constipation  New spot of back pain - left buttock area. Denies recent falls or inciting injury.  Ongoing memory difficulty.  Ongoing hearing difficulty without significant tinnitus.  Noticing increasing difficulty with swallowing thin liquids over the past year. No h/o aspiration pneumonias.   She doesn't regularly take lasix 40mg  daily.   Relevant past medical, surgical, family and social history reviewed and updated as indicated. Interim medical history since our last visit reviewed. Allergies and medications reviewed and updated. Current Outpatient Prescriptions on File Prior to Visit  Medication Sig  . acetaminophen (TYLENOL) 500 MG tablet Take 2 tablets (1,000 mg total) by mouth 3 (three) times daily.  . calcitonin, salmon, (MIACALCIN/FORTICAL) 200 UNIT/ACT nasal spray Place 1 spray into alternate nostrils daily.  . Calcium-Magnesium-Vitamin D [id number] MG-MG-UNIT TABS Take 1 tablet by mouth 2 (two) times daily.  . felodipine (PLENDIL) 2.5 MG 24 hr tablet TAKE 1 TABLET DAILY  . ferrous sulfate 325 (65 FE) MG  tablet Take 325 mg by mouth daily with breakfast.  . furosemide (LASIX) 20 MG tablet Take 2 tablets (40 mg total) by mouth daily as needed.  . Ipratropium-Albuterol (COMBIVENT RESPIMAT) 20-100 MCG/ACT AERS respimat Inhale 1 puff into the lungs every 6 (six) hours.  Marland Kitchen latanoprost (XALATAN) 0.005 % ophthalmic solution Place 1 drop into both eyes at bedtime.   Marland Kitchen levothyroxine (SYNTHROID, LEVOTHROID) 50 MCG tablet TAKE 1 TABLET DAILY  . lidocaine (LIDODERM) 5 % Place 1 patch onto the skin daily. Remove & Discard patch within 12 hours or as directed by MD  . Misc Natural Products (FIBER 7) POWD Take by mouth daily.  . Multiple Vitamin (MULTIVITAMIN) capsule Take 1 capsule by mouth every morning.   . Omega-3 Fatty Acids (FISH OIL CONCENTRATE) 1000 MG CAPS Take 2 capsules by mouth 2 (two) times daily.    . potassium chloride SA (K-DUR,KLOR-CON) 20 MEQ tablet Take 1 tablet (20 mEq total) by mouth daily.  . vitamin B-12 (CYANOCOBALAMIN) 500 MCG tablet Take 500 mcg by mouth daily.  Marland Kitchen warfarin (COUMADIN) 2.5 MG tablet Take 1 tablet daily except 1 1/2 tablets on Mondays and Thursdays (Patient taking differently: Take 3.75 mg by mouth every evening. )  . LORazepam (ATIVAN) 0.5 MG tablet Take 1 tablet (0.5 mg total) by mouth every 8 (eight) hours as needed for anxiety.  . [DISCONTINUED] Calcium Carbonate-Vitamin D (CALCIUM PLUS VITAMIN D PO) Take 2 tablets by mouth daily.     No current facility-administered medications on file prior to visit.  Review of Systems Per HPI unless specifically indicated in ROS section     Objective:    BP 132/82   Pulse 80   Temp 97.7 F (36.5 C) (Oral)   Wt 113 lb 8 oz (51.5 kg)   BMI 18.32 kg/m   Wt Readings from Last 3 Encounters:  09/02/16 113 lb 8 oz (51.5 kg)  09/02/16 113 lb 8 oz (51.5 kg)  08/19/16 106 lb (48.1 kg)    Physical Exam  Constitutional: She appears well-developed and well-nourished. No distress.  Thin, walks with walker  HENT:    Mouth/Throat: Oropharynx is clear and moist. No oropharyngeal exudate.  Neck: Normal range of motion. Neck supple. No thyromegaly present.  Cardiovascular: Normal rate, normal heart sounds and intact distal pulses.  An irregularly irregular rhythm present.  No murmur heard. Pulmonary/Chest: Effort normal and breath sounds normal. No respiratory distress. She has no wheezes. She has no rales.  Musculoskeletal: She exhibits edema (1+ pitting bilaterally).  Tender to palpation at L sciatic notch  Neurological:  Slowed gait with walker  Skin: Skin is warm and dry. No rash noted.  Psychiatric: She has a normal mood and affect.  Nursing note and vitals reviewed.  Results for orders placed or performed in visit on 08/17/16  POCT INR  Result Value Ref Range   INR 2.1    Lab Results  Component Value Date   TSH 2.750 07/27/2016    Lab Results  Component Value Date   HGBA1C 6.2 08/01/2015    CTA chest 07/12/2016: 11 mm left lower lobe subpleural soft tissue mass. Consider one of the following in 3 months for both low-risk and high-risk individuals: (a) repeat chest CT, (b) follow-up PET-CT, or (c) tissue sampling. This recommendation follows the consensus statement: Guidelines for Management of Incidental Pulmonary Nodules Detected on CT Images: From the Fleischner Society 2017; Radiology 2017; 284:228-243.  ==> LLL subpleural nodule stable since 2011. No f/u needed.     Assessment & Plan:   Problem List Items Addressed This Visit    Depression with anxiety - Primary    Some improvement noted in anxiety however ongoing. Will continue to titrate sertraline to 100mg  daily, continue lorazepam PRN.  I have asked patient to check with our referral coordinator on counseling referral local to [place].  PHQ9 - 94 today      Diet-controlled diabetes mellitus ([place])    Anticipate has improved to prediabetes range. Will check A1c next labwork.       Encounter for therapeutic drug  monitoring   Hearing loss    Failed hearing screen with our health advisor today - will refer to audiology - pt and son agree.       Relevant Orders   Ambulatory referral to Audiology   Left buttock pain    Anticipate L sciatica - exercises provided today.       Memory deficit    Previously thought pseudodementia - however no significant improvement with sertraline 75mg .  Mini-cog today 17/20. Will do MMSE next visit.  Discussed possibly adding aricept.       Pulmonary nodules   Right heart failure, NYHA class 3    With recent weight gain noted - encouraged she take lasix 20mg  daily x 3 days.           Follow up plan: Return in about 2 months (around 10/31/2016) for follow up visit.  Ria Bush, MD

## 2016-09-02 NOTE — Assessment & Plan Note (Addendum)
Some improvement noted in anxiety however ongoing. Will continue to titrate sertraline to 100mg  daily, continue lorazepam PRN.  I have asked patient to check with our referral coordinator on counseling referral local to [place].  PHQ9 - 19 today

## 2016-09-02 NOTE — Progress Notes (Signed)
Subjective:   [name] is a 83 y.o. female who presents for Medicare Annual (Subsequent) preventive examination.  Review of Systems:  N/A Cardiac Risk Factors include: advanced age (>52men, >45 women)     Objective:     Vitals: BP 132/82 (BP Location: Left Arm, Patient Position: Sitting, Cuff Size: Normal)   Pulse 94   Temp 97.7 F (36.5 C) (Oral)   Wt 113 lb 8 oz (51.5 kg)   SpO2 92%   BMI 18.32 kg/m   Body mass index is 18.32 kg/m.   Tobacco History  Smoking Status  . Former Smoker  . Packs/day: 1.00  . Years: 20.00  . Types: Cigarettes  . Quit date: 10/13/1998  Smokeless Tobacco  . Never Used     Counseling given: No   Past Medical History:  Diagnosis Date  . Anemia   . Anxiety   . Aortic sclerosis   . Arthritis   . Chronic atrial fibrillation ([place])   . Chronic gastritis 10/2009   Duodenitis on EGD  . CKD (chronic kidney disease) stage 2, GFR 60-89 ml/min   . Collagen vascular disease ([place])   . COPD (chronic obstructive pulmonary disease) ([place])   . Diabetes type 2, controlled ([place])    Diet controlled  . Diverticulosis   . Dry ARMD    Retinal hemorrhage (09/2013) [place]  . Essential hypertension, benign   . GERD (gastroesophageal reflux disease)   . Glaucoma    Dr. Venetia Maxon  . Hiatal hernia 2011   Small  . History of pelvic fracture April 2013  . History of rheumatic fever   . History of skin cancer   . History of TIA (transient ischemic attack)   . Hypothyroidism   . Macular degeneration   . Mixed hyperlipidemia   . Osteoporosis 04/2013   Thoracic compression fracture, on bisphosphonate and cal/vit D  . Pulmonary nodules    Stable bilateral on CT 01/2010, rec rpt yearly for 2 yrs, pt decided to stop f/u  . Seasonal allergies   . Vertebral compression fracture ([place]) 05/2016   Vertebroplasty L2/L3   Past Surgical History:  Procedure Laterality Date  . BREAST LUMPECTOMY  1983   LEFT, benign  . Carotid US  2011   mild plaque  formation  . CATARACT EXTRACTION  2012   RIGHT  . CESAREAN SECTION     (and h/o 6 miscarriages)  . CHOLECYSTECTOMY N/A 02/27/2013   Procedure: LAPAROSCOPIC CHOLECYSTECTOMY;  Surgeon: Donato Heinz, MD;  Location: [facility];  Service: General;  Laterality: N/A;  . COLONOSCOPY  03/2010   int hemorrhoids, diverticulosis, no need to repeat (Dr. Sydell Axon)  . DEXA  05/2010   T score -4.5 spine, -2.4 femur; does not want to repeat  . DEXA  04/2013   T score 3.9 [facility] spine, 2.6 hip  . DILATION AND CURETTAGE OF UTERUS     x2  . ESOPHAGOGASTRODUODENOSCOPY  10/2009   small HH, multiple antric ulcerations s/p biopsy, mild chronic gastritis/duodenitis  . ESOPHAGOGASTRODUODENOSCOPY  05/26/2012   RMR: small HH, o/w normal.   . IR GENERIC HISTORICAL  04/16/2016   IR RADIOLOGIST EVAL & MGMT 04/16/2016 [facility]  . IR GENERIC HISTORICAL  06/16/2016   IR VERTEBROPLASTY LUMBAR BX INC UNI/BIL INC/INJECT/IMAGING 06/16/2016 Luanne Bras, MD [facility]  . IR GENERIC HISTORICAL  06/16/2016   IR VERTEBROPLASTY EA ADDL (T&LS) BX INC UNI/BIL INC INJECT/IMAGING 06/16/2016 Luanne Bras, MD [facility]  . LAPAROSCOPIC CHOLECYSTECTOMY  02/2013  Dr. Geroge Baseman  . PATELLA FRACTURE SURGERY  05/2006   LEFT surgically repaired with pins and wire  . TONSILLECTOMY     Family History  Problem Relation Age of Onset  . Coronary artery disease Mother 54    MI deceased  . Colon cancer Father 73  . Arrhythmia Sister   . Stroke Paternal Grandmother   . Diabetes Neg Hx    History  Sexual Activity  . Sexual activity: No    Outpatient Encounter Prescriptions as of 09/02/2016  Medication Sig  . acetaminophen (TYLENOL) 500 MG tablet Take 2 tablets (1,000 mg total) by mouth 3 (three) times daily.  . calcitonin, salmon, (MIACALCIN/FORTICAL) 200 UNIT/ACT nasal spray Place 1 spray into alternate nostrils daily.  . Calcium-Magnesium-Vitamin D [id number] MG-MG-UNIT TABS Take 1 tablet by mouth 2 (two) times daily.  .  felodipine (PLENDIL) 2.5 MG 24 hr tablet TAKE 1 TABLET DAILY  . ferrous sulfate 325 (65 FE) MG tablet Take 325 mg by mouth daily with breakfast.  . furosemide (LASIX) 20 MG tablet Take 2 tablets (40 mg total) by mouth daily as needed.  . Ipratropium-Albuterol (COMBIVENT RESPIMAT) 20-100 MCG/ACT AERS respimat Inhale 1 puff into the lungs every 6 (six) hours.  Marland Kitchen latanoprost (XALATAN) 0.005 % ophthalmic solution Place 1 drop into both eyes at bedtime.   Marland Kitchen levothyroxine (SYNTHROID, LEVOTHROID) 50 MCG tablet TAKE 1 TABLET DAILY  . lidocaine (LIDODERM) 5 % Place 1 patch onto the skin daily. Remove & Discard patch within 12 hours or as directed by MD  . LORazepam (ATIVAN) 0.5 MG tablet Take 1 tablet (0.5 mg total) by mouth every 8 (eight) hours as needed for anxiety.  . Misc Natural Products (FIBER 7) POWD Take by mouth daily.  . Multiple Vitamin (MULTIVITAMIN) capsule Take 1 capsule by mouth every morning.   . Omega-3 Fatty Acids (FISH OIL CONCENTRATE) 1000 MG CAPS Take 2 capsules by mouth 2 (two) times daily.    . potassium chloride SA (K-DUR,KLOR-CON) 20 MEQ tablet Take 1 tablet (20 mEq total) by mouth daily.  . sertraline (ZOLOFT) 100 MG tablet Take 1 tablet (100 mg total) by mouth daily.  . vitamin B-12 (CYANOCOBALAMIN) 500 MCG tablet Take 500 mcg by mouth daily.  Marland Kitchen warfarin (COUMADIN) 2.5 MG tablet Take 1 tablet daily except 1 1/2 tablets on Mondays and Thursdays (Patient taking differently: Take 3.75 mg by mouth every evening. )   No facility-administered encounter medications on file as of 09/02/2016.     Activities of Daily Living In your present state of health, do you have any difficulty performing the following activities: 09/02/2016 07/27/2016  Hearing? Y N  Vision? N N  Difficulty concentrating or making decisions? Y N  Walking or climbing stairs? Y Y  Dressing or bathing? N Y  Doing errands, shopping? Tempie Donning  Preparing Food and eating ? N -  Using the Toilet? N -  In the past six  months, have you accidently leaked urine? N -  Do you have problems with loss of bowel control? N -  Managing your Medications? Y -  Managing your Finances? Y -  Housekeeping or managing your Housekeeping? Y -  Some recent data might be hidden    Patient Care Team: Ria Bush, MD as PCP - General (Family Medicine)    Assessment:     Hearing Screening   125Hz  250Hz  500Hz  1000Hz  2000Hz  3000Hz  4000Hz  6000Hz  8000Hz   Right ear:   0 0 0  0  Left ear:   0 0 0  0    Vision Screening Comments: Last exam approx. 12 mths ago   Exercise Activities and Dietary recommendations Current Exercise Habits: Home exercise routine, Type of exercise: stretching;calisthenics, Time (Minutes): 35, Frequency (Times/Week): 7, Weekly Exercise (Minutes/Week): 245, Intensity: Mild, Exercise limited by: None identified  Goals    . Increase physical activity          Starting 09/02/16, I will continue to exercise at least 30-45 min daily.       Fall Risk Fall Risk  09/02/2016 08/08/2015 04/26/2014 04/20/2013 03/29/2012  Falls in the past year? No No Yes No -  Number falls in past yr: - - 1 - -  Injury with Fall? - - Yes - -  Risk for fall due to : - - Other (Comment) - History of fall(s)  Risk for fall due to (comments): - - Standing on a chair and got distracted - -   Depression Screen PHQ 2/9 Scores 09/02/2016 08/08/2015 04/26/2014 04/20/2013  PHQ - 2 Score 3 0 2 4  PHQ- 9 Score 19 - 8 14     Cognitive Function MMSE - Mini Mental State Exam 09/02/2016  Orientation to time 5  Orientation to Place 5  Registration 3  Attention/ Calculation 0  Recall 1  Recall-comments pt was unable to recall 2 of 3 words  Language- name 2 objects 0  Language- repeat 1  Language- follow 3 step command 2  Language- follow 3 step command-comments pt was unable to complete 1 step of 3 step command  Language- read & follow direction 0  Write a sentence 0  Copy design 0  Total score 17     PLEASE NOTE: A Mini-Cog  screen was completed. Maximum score is 20. A value of 0 denotes this part of Folstein MMSE was not completed or the patient failed this part of the Mini-Cog screening.   Mini-Cog Screening Orientation to Time - Max 5 pts Orientation to Place - Max 5 pts Registration - Max 3 pts Recall - Max 3 pts Language Repeat - Max 1 pts Language Follow 3 Step Command - Max 3 pts     Immunization History  Administered Date(s) Administered  . Influenza Split 04/20/2012  . Influenza,inj,Quad PF,36+ Mos 04/20/2013, 04/26/2014, 04/26/2015, 03/31/2016  . Pneumococcal Conjugate-13 04/26/2014  . Pneumococcal Polysaccharide-23 07/20/1998  . Td 07/20/2004  . Tdap 12/01/2013  . Zoster 07/20/2009   Screening Tests Health Maintenance  Topic Date Due  . HEMOGLOBIN A1C  03/01/2017 (Originally 01/29/2016)  . MAMMOGRAM  06/04/2017 (Originally 06/05/2016)  . DTaP/Tdap/Td (2 - Td) 12/02/2023  . TETANUS/TDAP  12/02/2023  . INFLUENZA VACCINE  Completed  . DEXA SCAN  Completed  . ZOSTAVAX  Completed  . PNA vac Low Risk Adult  Completed      Plan:     I have personally reviewed and addressed the Medicare Annual Wellness questionnaire and have noted the following in the patient's chart:  A. Medical and social history B. Use of alcohol, tobacco or illicit drugs  C. Current medications and supplements D. Functional ability and status E.  Nutritional status F.  Physical activity G. Advance directives H. List of other physicians I.  Hospitalizations, surgeries, and ER visits in previous 12 months J.  [place] to include hearing, vision, cognitive, depression L. Referrals and appointments - none  In addition, I have reviewed and discussed with patient certain preventive protocols, quality metrics, and best practice recommendations.  A written personalized care plan for preventive services as well as general preventive health recommendations were provided to patient.  See attached scanned  questionnaire for additional information.   Signed,   Lindell Noe, MHA, BS, LPN Health Coach

## 2016-09-02 NOTE — Progress Notes (Signed)
Pre visit review using our clinic review tool, if applicable. No additional management support is needed unless otherwise documented below in the visit note. 

## 2016-09-03 ENCOUNTER — Encounter: Payer: Self-pay | Admitting: Podiatry

## 2016-09-03 ENCOUNTER — Ambulatory Visit (INDEPENDENT_AMBULATORY_CARE_PROVIDER_SITE_OTHER): Payer: Medicare Other | Admitting: Podiatry

## 2016-09-03 DIAGNOSIS — Q828 Other specified congenital malformations of skin: Secondary | ICD-10-CM | POA: Diagnosis not present

## 2016-09-03 DIAGNOSIS — M79675 Pain in left toe(s): Secondary | ICD-10-CM

## 2016-09-03 DIAGNOSIS — B351 Tinea unguium: Secondary | ICD-10-CM

## 2016-09-03 DIAGNOSIS — M79674 Pain in right toe(s): Secondary | ICD-10-CM | POA: Diagnosis not present

## 2016-09-04 NOTE — Progress Notes (Signed)
Subjective:     Patient ID: [name], female   DOB: [birth year], 83 y.o.   MRN: [id number]  HPI 83 year old female presents the office today for concerns of thick, painful, elongated toenails that she cannot trim herself. She states the nails are painful to pressure in shoes. Denies any swelling redness or drainage from the toe nail sites. She also has a painful callus on the right ball of her foot. Areas painful to pressure in shoes. She denies any recent injury or trauma. No swelling. No other complaints at this time.  Review of Systems  All other systems reviewed and are negative.      Objective:   Physical Exam General: AAO x3, NAD  Dermatological: Nails are hypertrophic, dystrophic, brittle, discolored, elongated 10. No surrounding redness or drainage. Tenderness nails 1-5 bilaterally. Hyperkeratotic lesion right foot submetatarsal one. Upon debridement there is no underlying ulceration, drainage or any signs of infection. No open lesions or other pre-ulcerative lesions are identified today.  Vascular: Dorsalis Pedis artery and Posterior Tibial artery pedal pulses are 1/4 bilateral with immedate capillary fill time. There is no pain with calf compression, swelling, warmth, erythema. Denies any claudication symptoms.  Neruologic: Grossly intact via light touch bilateral. Vibratory intact via tuning fork bilateral. Protective threshold with Semmes Wienstein monofilament intact to all pedal sites bilateral.  Musculoskeletal: No gross boney pedal deformities bilateral. No pain, crepitus, or limitation noted with foot and ankle range of motion bilateral. Muscular strength 5/5 in all groups tested bilateral.  Gait: Unassisted, Nonantalgic.      Assessment:      Symptomatic onychomycosis; porokertosis  Plan:     -Treatment options discussed including all alternatives, risks, and complications -Etiology of symptoms were discussed -Nails debrided 10 without complications or  bleeding. -Hyperkeratotic lesion shop and debrided 1 without complications or bleeding. -Daily foot inspection -Follow-up in 3 months or sooner if any problems arise. In the meantime, encouraged to call the office with any questions, concerns, change in symptoms.   Celesta Gentile, DPM

## 2016-09-04 NOTE — Progress Notes (Deleted)
Subjective: 83 year old female persist the office today for follow up evaluation of an ulceration to the left medial second toe. She states that she's been putting antibiotic ointment and a bandage overlying this area daily. She does of the area is getting better although it is still painful at times. She denies any redness or swelling. Denies any systemic complaints such as fevers, chills, nausea, vomiting. No acute changes since last appointment, and no other complaints at this time.   Objective: AAO x3, NAD DP/PT pulses palpable bilaterally, CRT less than 3 seconds Hammertoe contractures present. On the medial aspect left second toes hyperkeratotic lesion over the area of the previous wound. Upon debridement there is no underlying ulceration, drainage or any signs of infection today. No open lesions or pre-ulcerative lesions.  No pain with calf compression, swelling, warmth, erythema  Assessment: Healed ulceration left second toe with pre-ulcerative callus  Plan: -All treatment options discussed with the patient including all alternatives, risks, complications.  -Hyperkeratotic lesion was sharply debrided today without complications or bleeding and the wound appears to be healed. I dispensed various offloading pads to help prevent recurrence. Discussed daily foot inspection. -Follow-up with me as needed. She is moving to [facility] tomorrow and she has already set up appointments with a podiatrist there.  -Patient encouraged to call the office with any questions, concerns, change in symptoms.   Celesta Gentile, DPM

## 2016-09-06 ENCOUNTER — Encounter: Payer: Self-pay | Admitting: Family Medicine

## 2016-09-07 ENCOUNTER — Ambulatory Visit (INDEPENDENT_AMBULATORY_CARE_PROVIDER_SITE_OTHER): Payer: Medicare Other | Admitting: *Deleted

## 2016-09-07 ENCOUNTER — Other Ambulatory Visit: Payer: Self-pay | Admitting: Family Medicine

## 2016-09-07 DIAGNOSIS — I4819 Other persistent atrial fibrillation: Secondary | ICD-10-CM

## 2016-09-07 DIAGNOSIS — G458 Other transient cerebral ischemic attacks and related syndromes: Secondary | ICD-10-CM | POA: Diagnosis not present

## 2016-09-07 DIAGNOSIS — I481 Persistent atrial fibrillation: Secondary | ICD-10-CM

## 2016-09-07 DIAGNOSIS — Z5181 Encounter for therapeutic drug level monitoring: Secondary | ICD-10-CM

## 2016-09-07 LAB — POCT INR: INR: 1.9

## 2016-09-09 MED ORDER — POTASSIUM CHLORIDE CRYS ER 20 MEQ PO TBCR: 20.0000 meq | EXTENDED_RELEASE_TABLET | Freq: Every day | ORAL | 1 refills | Status: DC | PRN

## 2016-09-10 MED ORDER — DONEPEZIL HCL 5 MG PO TABS: 5.0000 mg | ORAL_TABLET | Freq: Every day | ORAL | 3 refills | Status: DC

## 2016-09-11 ENCOUNTER — Encounter (HOSPITAL_COMMUNITY): Payer: Self-pay

## 2016-09-11 ENCOUNTER — Inpatient Hospital Stay (HOSPITAL_COMMUNITY)
Admission: EM | Admit: 2016-09-11 | Discharge: 2016-09-15 | DRG: 292 | Disposition: A | Discharge status: Home Health | Payer: Medicare Other | Attending: Family Medicine | Admitting: Family Medicine

## 2016-09-11 ENCOUNTER — Emergency Department (HOSPITAL_COMMUNITY): Payer: Medicare Other

## 2016-09-11 DIAGNOSIS — Z79899 Other long term (current) drug therapy: Secondary | ICD-10-CM | POA: Diagnosis not present

## 2016-09-11 DIAGNOSIS — F419 Anxiety disorder, unspecified: Secondary | ICD-10-CM | POA: Diagnosis present

## 2016-09-11 DIAGNOSIS — H3530 Unspecified macular degeneration: Secondary | ICD-10-CM | POA: Diagnosis present

## 2016-09-11 DIAGNOSIS — Z8249 Family history of ischemic heart disease and other diseases of the circulatory system: Secondary | ICD-10-CM

## 2016-09-11 DIAGNOSIS — R0602 Shortness of breath: Secondary | ICD-10-CM | POA: Diagnosis not present

## 2016-09-11 DIAGNOSIS — I10 Essential (primary) hypertension: Secondary | ICD-10-CM | POA: Diagnosis present

## 2016-09-11 DIAGNOSIS — F329 Major depressive disorder, single episode, unspecified: Secondary | ICD-10-CM | POA: Diagnosis present

## 2016-09-11 DIAGNOSIS — E119 Type 2 diabetes mellitus without complications: Secondary | ICD-10-CM

## 2016-09-11 DIAGNOSIS — J449 Chronic obstructive pulmonary disease, unspecified: Secondary | ICD-10-CM | POA: Diagnosis present

## 2016-09-11 DIAGNOSIS — Z800 Family history of malignant neoplasm of digestive organs: Secondary | ICD-10-CM | POA: Diagnosis not present

## 2016-09-11 DIAGNOSIS — I5031 Acute diastolic (congestive) heart failure: Secondary | ICD-10-CM | POA: Diagnosis not present

## 2016-09-11 DIAGNOSIS — M810 Age-related osteoporosis without current pathological fracture: Secondary | ICD-10-CM | POA: Diagnosis present

## 2016-09-11 DIAGNOSIS — K219 Gastro-esophageal reflux disease without esophagitis: Secondary | ICD-10-CM | POA: Diagnosis not present

## 2016-09-11 DIAGNOSIS — I4819 Other persistent atrial fibrillation: Secondary | ICD-10-CM | POA: Diagnosis present

## 2016-09-11 DIAGNOSIS — E038 Other specified hypothyroidism: Secondary | ICD-10-CM | POA: Diagnosis not present

## 2016-09-11 DIAGNOSIS — R0603 Acute respiratory distress: Secondary | ICD-10-CM

## 2016-09-11 DIAGNOSIS — I130 Hypertensive heart and chronic kidney disease with heart failure and stage 1 through stage 4 chronic kidney disease, or unspecified chronic kidney disease: Secondary | ICD-10-CM | POA: Diagnosis not present

## 2016-09-11 DIAGNOSIS — Z87891 Personal history of nicotine dependence: Secondary | ICD-10-CM | POA: Diagnosis not present

## 2016-09-11 DIAGNOSIS — I481 Persistent atrial fibrillation: Secondary | ICD-10-CM | POA: Diagnosis not present

## 2016-09-11 DIAGNOSIS — N182 Chronic kidney disease, stage 2 (mild): Secondary | ICD-10-CM | POA: Diagnosis present

## 2016-09-11 DIAGNOSIS — I129 Hypertensive chronic kidney disease with stage 1 through stage 4 chronic kidney disease, or unspecified chronic kidney disease: Secondary | ICD-10-CM | POA: Diagnosis not present

## 2016-09-11 DIAGNOSIS — E1122 Type 2 diabetes mellitus with diabetic chronic kidney disease: Secondary | ICD-10-CM | POA: Diagnosis present

## 2016-09-11 DIAGNOSIS — Z66 Do not resuscitate: Secondary | ICD-10-CM | POA: Diagnosis present

## 2016-09-11 DIAGNOSIS — Z823 Family history of stroke: Secondary | ICD-10-CM

## 2016-09-11 DIAGNOSIS — I509 Heart failure, unspecified: Secondary | ICD-10-CM

## 2016-09-11 DIAGNOSIS — Z7901 Long term (current) use of anticoagulants: Secondary | ICD-10-CM | POA: Diagnosis not present

## 2016-09-11 DIAGNOSIS — Z9049 Acquired absence of other specified parts of digestive tract: Secondary | ICD-10-CM | POA: Diagnosis not present

## 2016-09-11 DIAGNOSIS — Z8673 Personal history of transient ischemic attack (TIA), and cerebral infarction without residual deficits: Secondary | ICD-10-CM

## 2016-09-11 DIAGNOSIS — E876 Hypokalemia: Secondary | ICD-10-CM | POA: Diagnosis present

## 2016-09-11 DIAGNOSIS — J984 Other disorders of lung: Secondary | ICD-10-CM | POA: Diagnosis present

## 2016-09-11 DIAGNOSIS — E039 Hypothyroidism, unspecified: Secondary | ICD-10-CM | POA: Diagnosis present

## 2016-09-11 DIAGNOSIS — E785 Hyperlipidemia, unspecified: Secondary | ICD-10-CM | POA: Diagnosis present

## 2016-09-11 DIAGNOSIS — R0600 Dyspnea, unspecified: Secondary | ICD-10-CM

## 2016-09-11 DIAGNOSIS — I50810 Right heart failure, unspecified: Secondary | ICD-10-CM | POA: Diagnosis not present

## 2016-09-11 LAB — COMPREHENSIVE METABOLIC PANEL
ALK PHOS: 73 U/L (ref 38–126)
ALT: 56 U/L — ABNORMAL HIGH (ref 14–54)
ANION GAP: 7 (ref 5–15)
AST: 63 U/L — ABNORMAL HIGH (ref 15–41)
Albumin: 3.3 g/dL — ABNORMAL LOW (ref 3.5–5.0)
BUN: 14 mg/dL (ref 6–20)
CO2: 29 mmol/L (ref 22–32)
CREATININE: 0.67 mg/dL (ref 0.44–1.00)
Calcium: 9.3 mg/dL (ref 8.9–10.3)
Chloride: 105 mmol/L (ref 101–111)
GFR calc non Af Amer: >60 mL/min (ref >60)
GLUCOSE: 180 mg/dL — AB (ref 65–99)
Potassium: 2.9 mmol/L — ABNORMAL LOW (ref 3.5–5.1)
Sodium: 141 mmol/L (ref 135–145)
TOTAL PROTEIN: 6.4 g/dL — AB (ref 6.5–8.1)
Total Bilirubin: 1.4 mg/dL — ABNORMAL HIGH (ref 0.3–1.2)

## 2016-09-11 LAB — TROPONIN I
Troponin I: <0.03 ng/mL
Troponin I: 0.03 ng/mL (ref <0.03)

## 2016-09-11 LAB — GLUCOSE, CAPILLARY
Glucose-Capillary: 206 mg/dL — ABNORMAL HIGH (ref 65–99)
Glucose-Capillary: 243 mg/dL — ABNORMAL HIGH (ref 65–99)

## 2016-09-11 LAB — TSH: TSH: 2.222 u[IU]/mL (ref 0.350–4.500)

## 2016-09-11 LAB — CBC
HCT: 36.5 % (ref 36.0–46.0)
Hemoglobin: 12.4 g/dL (ref 12.0–15.0)
MCH: 35.3 pg — ABNORMAL HIGH (ref 26.0–34.0)
MCHC: 34 g/dL (ref 30.0–36.0)
MCV: 104 fL — ABNORMAL HIGH (ref 78.0–100.0)
Platelets: 304 10*3/uL (ref 150–400)
RBC: 3.51 MIL/uL — ABNORMAL LOW (ref 3.87–5.11)
RDW: 15.7 % — ABNORMAL HIGH (ref 11.5–15.5)
WBC: 8.2 10*3/uL (ref 4.0–10.5)

## 2016-09-11 LAB — BRAIN NATRIURETIC PEPTIDE: B Natriuretic Peptide: 723 pg/mL — ABNORMAL HIGH (ref 0.0–100.0)

## 2016-09-11 LAB — APTT: aPTT: 38 seconds — ABNORMAL HIGH (ref 24–36)

## 2016-09-11 LAB — PROTIME-INR
INR: 1.71
Prothrombin Time: 20.3 s — ABNORMAL HIGH (ref 11.4–15.2)

## 2016-09-11 MED ORDER — POTASSIUM CHLORIDE 10 MEQ/100ML IV SOLN
10.0000 meq | INTRAVENOUS | Status: AC
  Administered 2016-09-11 (×3): 10 meq via INTRAVENOUS
  Filled 2016-09-11 (×2): qty 100

## 2016-09-11 MED ORDER — IPRATROPIUM-ALBUTEROL 20-100 MCG/ACT IN AERS: 1.0000 | INHALATION_SPRAY | Freq: Four times a day (QID) | RESPIRATORY_TRACT | Status: DC

## 2016-09-11 MED ORDER — ACETAMINOPHEN 325 MG PO TABS
650.0000 mg | ORAL_TABLET | Freq: Four times a day (QID) | ORAL | Status: DC | PRN
  Administered 2016-09-15 (×2): 650 mg via ORAL
  Filled 2016-09-11 (×2): qty 2

## 2016-09-11 MED ORDER — PREDNISONE 20 MG PO TABS
40.0000 mg | ORAL_TABLET | Freq: Every day | ORAL | Status: DC
  Administered 2016-09-12 – 2016-09-15 (×4): 40 mg via ORAL
  Filled 2016-09-11 (×4): qty 2

## 2016-09-11 MED ORDER — SODIUM CHLORIDE 0.9 % IV SOLN: 30.0000 meq | Freq: Once | INTRAVENOUS | Status: DC

## 2016-09-11 MED ORDER — METHYLPREDNISOLONE SODIUM SUCC 125 MG IJ SOLR
125.0000 mg | Freq: Once | INTRAMUSCULAR | Status: AC
  Administered 2016-09-11: 125 mg via INTRAVENOUS
  Filled 2016-09-11: qty 2

## 2016-09-11 MED ORDER — WARFARIN - PHARMACIST DOSING INPATIENT: Status: DC

## 2016-09-11 MED ORDER — FUROSEMIDE 10 MG/ML IJ SOLN
40.0000 mg | INTRAMUSCULAR | Status: AC
  Administered 2016-09-11: 40 mg via INTRAVENOUS
  Filled 2016-09-11: qty 4

## 2016-09-11 MED ORDER — POLYETHYLENE GLYCOL 3350 17 G PO PACK
17.0000 g | PACK | Freq: Every day | ORAL | Status: DC | PRN
  Administered 2016-09-15: 17 g via ORAL
  Filled 2016-09-11: qty 1

## 2016-09-11 MED ORDER — ALBUTEROL SULFATE (2.5 MG/3ML) 0.083% IN NEBU: 5.0000 mg | INHALATION_SOLUTION | Freq: Once | RESPIRATORY_TRACT | Status: DC

## 2016-09-11 MED ORDER — IPRATROPIUM-ALBUTEROL 0.5-2.5 (3) MG/3ML IN SOLN: 3.0000 mL | Freq: Four times a day (QID) | RESPIRATORY_TRACT | Status: DC

## 2016-09-11 MED ORDER — GI COCKTAIL ~~LOC~~
30.0000 mL | Freq: Once | ORAL | Status: AC
  Administered 2016-09-11: 30 mL via ORAL
  Filled 2016-09-11: qty 30

## 2016-09-11 MED ORDER — SERTRALINE HCL 50 MG PO TABS
100.0000 mg | ORAL_TABLET | Freq: Every day | ORAL | Status: DC
  Administered 2016-09-11 – 2016-09-15 (×5): 100 mg via ORAL
  Filled 2016-09-11 (×6): qty 2

## 2016-09-11 MED ORDER — ENOXAPARIN SODIUM 40 MG/0.4ML ~~LOC~~ SOLN
40.0000 mg | SUBCUTANEOUS | Status: DC
  Administered 2016-09-11 – 2016-09-12 (×2): 40 mg via SUBCUTANEOUS
  Filled 2016-09-11 (×2): qty 0.4

## 2016-09-11 MED ORDER — ACETAMINOPHEN 650 MG RE SUPP: 650.0000 mg | Freq: Four times a day (QID) | RECTAL | Status: DC | PRN

## 2016-09-11 MED ORDER — ALBUTEROL SULFATE (2.5 MG/3ML) 0.083% IN NEBU: 2.5000 mg | INHALATION_SOLUTION | Freq: Four times a day (QID) | RESPIRATORY_TRACT | Status: DC | PRN

## 2016-09-11 MED ORDER — ALBUTEROL SULFATE (2.5 MG/3ML) 0.083% IN NEBU
5.0000 mg | INHALATION_SOLUTION | Freq: Once | RESPIRATORY_TRACT | Status: AC
  Administered 2016-09-11: 5 mg via RESPIRATORY_TRACT
  Filled 2016-09-11: qty 6

## 2016-09-11 MED ORDER — WARFARIN SODIUM 2 MG PO TABS
4.0000 mg | ORAL_TABLET | Freq: Once | ORAL | Status: AC
  Administered 2016-09-11: 4 mg via ORAL
  Filled 2016-09-11: qty 2

## 2016-09-11 MED ORDER — FUROSEMIDE 10 MG/ML IJ SOLN
20.0000 mg | Freq: Every day | INTRAMUSCULAR | Status: DC
  Administered 2016-09-12 – 2016-09-14 (×3): 20 mg via INTRAVENOUS
  Filled 2016-09-11 (×3): qty 2

## 2016-09-11 MED ORDER — NITROGLYCERIN 0.4 MG SL SUBL: 0.4000 mg | SUBLINGUAL_TABLET | SUBLINGUAL | Status: DC | PRN

## 2016-09-11 MED ORDER — ASPIRIN 81 MG PO CHEW
81.0000 mg | CHEWABLE_TABLET | Freq: Every day | ORAL | Status: DC
  Administered 2016-09-11 – 2016-09-15 (×5): 81 mg via ORAL
  Filled 2016-09-11 (×6): qty 1

## 2016-09-11 MED ORDER — IPRATROPIUM-ALBUTEROL 0.5-2.5 (3) MG/3ML IN SOLN
3.0000 mL | Freq: Four times a day (QID) | RESPIRATORY_TRACT | Status: DC
  Administered 2016-09-11 – 2016-09-12 (×4): 3 mL via RESPIRATORY_TRACT
  Filled 2016-09-11 (×4): qty 3

## 2016-09-11 MED ORDER — POTASSIUM CHLORIDE CRYS ER 20 MEQ PO TBCR
40.0000 meq | EXTENDED_RELEASE_TABLET | Freq: Three times a day (TID) | ORAL | Status: DC
  Administered 2016-09-11 – 2016-09-12 (×5): 40 meq via ORAL
  Filled 2016-09-11 (×7): qty 2

## 2016-09-11 MED ORDER — ASPIRIN 81 MG PO CHEW
324.0000 mg | CHEWABLE_TABLET | Freq: Once | ORAL | Status: AC
Start: 2016-09-11 — End: 2016-09-11
  Administered 2016-09-11: 324 mg via ORAL
  Filled 2016-09-11: qty 4

## 2016-09-11 MED ORDER — DONEPEZIL HCL 5 MG PO TABS
5.0000 mg | ORAL_TABLET | Freq: Every day | ORAL | Status: DC
  Administered 2016-09-11 – 2016-09-14 (×4): 5 mg via ORAL
  Filled 2016-09-11 (×4): qty 1

## 2016-09-11 MED ORDER — LORAZEPAM 0.5 MG PO TABS
0.5000 mg | ORAL_TABLET | Freq: Three times a day (TID) | ORAL | Status: DC | PRN
  Administered 2016-09-13: 0.5 mg via ORAL
  Filled 2016-09-11: qty 1

## 2016-09-11 NOTE — ED Notes (Signed)
ED Provider at bedside. 

## 2016-09-11 NOTE — ED Triage Notes (Signed)
Shortness of breath that started yesterday- worse when laying down. Complains of back pain. Bilateral lower leg edema noted.

## 2016-09-11 NOTE — Progress Notes (Signed)
ANTICOAGULATION CONSULT NOTE - Initial Consult  Pharmacy Consult for Coumadin Indication: atrial fibrillation  Allergies  Allergen Reactions  . Propoxyphene N-Acetaminophen Shortness Of Breath  . Codeine Nausea And Vomiting  . Meperidine Hcl Nausea And Vomiting  . Morphine Nausea And Vomiting  . Shellfish Allergy Hives  . Tramadol Nausea Only    Patient Measurements: Height: 5\' 2"  (157.5 cm) Weight: 113 lb (51.3 kg) IBW/kg (Calculated) : 50.1  Vital Signs: Temp: 98.2 F (36.8 C) (02/23 1717) Temp Source: Oral (02/23 1717) BP: 167/100 (02/23 1717) Pulse Rate: 103 (02/23 1717)  Labs:  Recent Labs  09/11/16 1201  HGB 12.4  HCT 36.5  PLT 304  APTT 38*  LABPROT 20.3*  INR 1.71  CREATININE 0.67  TROPONINI <0.03    Estimated Creatinine Clearance: 37.7 mL/min (by C-G formula based on SCr of 0.67 mg/dL).   Medical History: Past Medical History:  Diagnosis Date  . Anemia   . Anxiety   . Aortic sclerosis   . Arthritis   . Chronic atrial fibrillation ([place])   . Chronic gastritis 10/2009   Duodenitis on EGD  . CKD (chronic kidney disease) stage 2, GFR 60-89 ml/min   . Collagen vascular disease ([place])   . COPD (chronic obstructive pulmonary disease) ([place])   . Diabetes type 2, controlled ([place])    Diet controlled  . Diverticulosis   . Dry ARMD    Retinal hemorrhage (09/2013) [facility]  . Essential hypertension, benign   . GERD (gastroesophageal reflux disease)   . Glaucoma    Dr. Venetia Maxon  . Hiatal hernia 2011   Small  . History of pelvic fracture April 2013  . History of rheumatic fever   . History of skin cancer   . History of TIA (transient ischemic attack)   . Hypothyroidism   . Macular degeneration   . Mixed hyperlipidemia   . Osteoporosis 04/2013   Thoracic compression fracture, on bisphosphonate and cal/vit D  . Pulmonary nodules    Stable bilateral on CT 01/2010, rec rpt yearly for 2 yrs, pt decided to stop f/u  . Seasonal allergies   . Vertebral  compression fracture ([place]) 05/2016   Vertebroplasty L2/L3    Medications:  Prescriptions Prior to Admission  Medication Sig Dispense Refill Last Dose  . acetaminophen (TYLENOL) 500 MG tablet Take 2 tablets (1,000 mg total) by mouth 3 (three) times daily.   Past Week at Unknown time  . donepezil (ARICEPT) 5 MG tablet Take 1 tablet (5 mg total) by mouth at bedtime. 30 tablet 3 unknown  . felodipine (PLENDIL) 2.5 MG 24 hr tablet TAKE 1 TABLET DAILY 90 tablet 1 Past Week at Unknown time  . furosemide (LASIX) 20 MG tablet Take 2 tablets (40 mg total) by mouth daily as needed. 180 tablet 0 unknown  . levothyroxine (SYNTHROID, LEVOTHROID) 50 MCG tablet TAKE 1 TABLET DAILY 90 tablet 3 Past Week at Unknown time  . Omega-3 Fatty Acids (FISH OIL CONCENTRATE) 1000 MG CAPS Take 2 capsules by mouth 2 (two) times daily.     Past Week at Unknown time  . calcitonin, salmon, (MIACALCIN/FORTICAL) 200 UNIT/ACT nasal spray Place 1 spray into alternate nostrils daily. (Patient not taking: Reported on 09/11/2016) 3.7 mL 12 Not Taking at Unknown time  . Calcium-Magnesium-Vitamin D [id number] MG-MG-UNIT TABS Take 1 tablet by mouth 2 (two) times daily.   Taking  . ferrous sulfate 325 (65 FE) MG tablet Take 325 mg by mouth daily with breakfast.   Taking  .  Ipratropium-Albuterol (COMBIVENT RESPIMAT) 20-100 MCG/ACT AERS respimat Inhale 1 puff into the lungs every 6 (six) hours. 4 g 3 Taking  . latanoprost (XALATAN) 0.005 % ophthalmic solution Place 1 drop into both eyes at bedtime.    Taking  . lidocaine (LIDODERM) 5 % Place 1 patch onto the skin daily. Remove & Discard patch within 12 hours or as directed by MD 30 patch 6 Taking  . LORazepam (ATIVAN) 0.5 MG tablet Take 1 tablet (0.5 mg total) by mouth every 8 (eight) hours as needed for anxiety. 60 tablet 0 Taking  . Misc Natural Products (FIBER 7) POWD Take by mouth daily.   Taking  . Multiple Vitamin (MULTIVITAMIN) capsule Take 1 capsule by mouth every morning.    Taking   . potassium chloride SA (K-DUR,KLOR-CON) 20 MEQ tablet Take 1 tablet (20 mEq total) by mouth daily as needed (with lasix). 30 tablet 1   . sertraline (ZOLOFT) 100 MG tablet Take 1 tablet (100 mg total) by mouth daily. 90 tablet 1 Taking  . vitamin B-12 (CYANOCOBALAMIN) 500 MCG tablet Take 500 mcg by mouth daily.   Taking  . warfarin (COUMADIN) 2.5 MG tablet Take 1 tablet daily except 1 1/2 tablets on Mondays and Thursdays (Patient taking differently: Take 3.75 mg by mouth every evening. ) 180 tablet 3 Taking    Assessment: 83yo female on chronic Coumadin PTA.  INR below goal on admission.  Goal of Therapy:  INR 2-3 Monitor platelets by anticoagulation protocol: Yes   Plan:  Coumadin 4mg  today x 1 INR daily  [place] Crane, Greydon Betke A 09/11/2016,5:19 PM

## 2016-09-11 NOTE — H&P (Signed)
History and Physical    [name] [id number] DOB: Jan 27, 1926 DOA: 09/11/2016  PCP: Ria Bush, MD  Patient coming from: Home  Chief Complaint: Shortness of Breath  HPI: [name] is a 83 y.o. female with medical history significant of atrial fibrillation, CHF, HTN, hypothyroidism, DM Type II, GERD, and nonspecific lung disease presents for worsening shortness of breath for 1 week.  Patient reports that her ankles started swelling 1 week ago and that she became increasingly short of breath as the week went on.  She said she felt like she needed to come in a day earlier due to her shortness of breath.  She does not wear oxygen at home.  Patient reports swelling has increased from her feet, to her ankles to her legs.  She reports she has been able to lay flat to sleep- she sleeps on her side.  No fevers, chills, no cough, no chest pain or pain with breathing.  No nausea or vomiting.  No recent travel and no sick contacts.  Never had previous hospitalizations due to congestive heart failure.  Patient denies history of underlying pulmonary disease.  ED Course: Patient was given IV steroids, IV lasix, and placed on oxygen.  Her EKG showed atrial fibrillation with premature complexes, and minimal ST depression.  Review of Systems: As per HPI otherwise 10 point review of systems negative.    Past Medical History:  Diagnosis Date  . Anemia   . Anxiety   . Aortic sclerosis   . Arthritis   . Chronic atrial fibrillation ([place])   . Chronic gastritis 10/2009   Duodenitis on EGD  . CKD (chronic kidney disease) stage 2, GFR 60-89 ml/min   . Collagen vascular disease ([place])   . COPD (chronic obstructive pulmonary disease) ([place])   . Diabetes type 2, controlled ([place])    Diet controlled  . Diverticulosis   . Dry ARMD    Retinal hemorrhage (09/2013) [place]  . Essential hypertension, benign   . GERD (gastroesophageal reflux disease)   . Glaucoma    Dr. Venetia Maxon  . Hiatal hernia  2011   Small  . History of pelvic fracture April 2013  . History of rheumatic fever   . History of skin cancer   . History of TIA (transient ischemic attack)   . Hypothyroidism   . Macular degeneration   . Mixed hyperlipidemia   . Osteoporosis 04/2013   Thoracic compression fracture, on bisphosphonate and cal/vit D  . Pulmonary nodules    Stable bilateral on CT 01/2010, rec rpt yearly for 2 yrs, pt decided to stop f/u  . Seasonal allergies   . Vertebral compression fracture ([place]) 05/2016   Vertebroplasty L2/L3    Past Surgical History:  Procedure Laterality Date  . BREAST LUMPECTOMY  1983   LEFT, benign  . Carotid US  2011   mild plaque formation  . CATARACT EXTRACTION  2012   RIGHT  . CESAREAN SECTION     (and h/o 6 miscarriages)  . CHOLECYSTECTOMY N/A 02/27/2013   Procedure: LAPAROSCOPIC CHOLECYSTECTOMY;  Surgeon: Donato Heinz, MD;  Location: [facility];  Service: General;  Laterality: N/A;  . COLONOSCOPY  03/2010   int hemorrhoids, diverticulosis, no need to repeat (Dr. Sydell Axon)  . DEXA  05/2010   T score -4.5 spine, -2.4 femur; does not want to repeat  . DEXA  04/2013   T score 3.9 [facility] spine, 2.6 hip  . DILATION AND CURETTAGE OF UTERUS     x2  .  ESOPHAGOGASTRODUODENOSCOPY  10/2009   small HH, multiple antric ulcerations s/p biopsy, mild chronic gastritis/duodenitis  . ESOPHAGOGASTRODUODENOSCOPY  05/26/2012   RMR: small HH, o/w normal.   . IR GENERIC HISTORICAL  04/16/2016   IR RADIOLOGIST EVAL & MGMT 04/16/2016 [facility]  . IR GENERIC HISTORICAL  06/16/2016   IR VERTEBROPLASTY LUMBAR BX INC UNI/BIL INC/INJECT/IMAGING 06/16/2016 Luanne Bras, MD [facility]  . IR GENERIC HISTORICAL  06/16/2016   IR VERTEBROPLASTY EA ADDL (T&LS) BX INC UNI/BIL INC INJECT/IMAGING 06/16/2016 Luanne Bras, MD [facility]  . LAPAROSCOPIC CHOLECYSTECTOMY  02/2013   Dr. Geroge Baseman  . PATELLA FRACTURE SURGERY  05/2006   LEFT surgically repaired with pins and wire  . TONSILLECTOMY         reports that she quit smoking about 17 years ago. Her smoking use included Cigarettes. She has a 20.00 pack-year smoking history. She has never used smokeless tobacco. She reports that she does not drink alcohol or use drugs.  Allergies  Allergen Reactions  . Propoxyphene N-Acetaminophen Shortness Of Breath  . Codeine Nausea And Vomiting  . Meperidine Hcl Nausea And Vomiting  . Morphine Nausea And Vomiting  . Shellfish Allergy Hives  . Tramadol Nausea Only    Family History  Problem Relation Age of Onset  . Coronary artery disease Mother 53    MI deceased  . Colon cancer Father 66  . Arrhythmia Sister   . Stroke Paternal Grandmother   . Diabetes Neg Hx      Prior to Admission medications   Medication Sig Start Date End Date Taking? Authorizing Provider  acetaminophen (TYLENOL) 500 MG tablet Take 2 tablets (1,000 mg total) by mouth 3 (three) times daily. 12/23/15   Ria Bush, MD  calcitonin, salmon, (MIACALCIN/FORTICAL) 200 UNIT/ACT nasal spray Place 1 spray into alternate nostrils daily. 02/24/16   Ria Bush, MD  Calcium-Magnesium-Vitamin D [phone] MG-MG-UNIT TABS Take 1 tablet by mouth 2 (two) times daily.    Historical Provider, MD  donepezil (ARICEPT) 5 MG tablet Take 1 tablet (5 mg total) by mouth at bedtime. 09/10/16   Ria Bush, MD  felodipine (PLENDIL) 2.5 MG 24 hr tablet TAKE 1 TABLET DAILY 05/25/16   Ria Bush, MD  ferrous sulfate 325 (65 FE) MG tablet Take 325 mg by mouth daily with breakfast.    Historical Provider, MD  furosemide (LASIX) 20 MG tablet Take 2 tablets (40 mg total) by mouth daily as needed. 06/09/16   Ria Bush, MD  Ipratropium-Albuterol (COMBIVENT RESPIMAT) 20-100 MCG/ACT AERS respimat Inhale 1 puff into the lungs every 6 (six) hours. 08/03/16   Ria Bush, MD  latanoprost (XALATAN) 0.005 % ophthalmic solution Place 1 drop into both eyes at bedtime.     Historical Provider, MD  levothyroxine (SYNTHROID,  LEVOTHROID) 50 MCG tablet TAKE 1 TABLET DAILY 06/18/16   Ria Bush, MD  lidocaine (LIDODERM) 5 % Place 1 patch onto the skin daily. Remove & Discard patch within 12 hours or as directed by MD 05/01/16   Ria Bush, MD  LORazepam (ATIVAN) 0.5 MG tablet Take 1 tablet (0.5 mg total) by mouth every 8 (eight) hours as needed for anxiety. 07/17/16   Ria Bush, MD  Misc Natural Products (FIBER 7) POWD Take by mouth daily.    Historical Provider, MD  Multiple Vitamin (MULTIVITAMIN) capsule Take 1 capsule by mouth every morning.     Historical Provider, MD  Omega-3 Fatty Acids (FISH OIL CONCENTRATE) 1000 MG CAPS Take 2 capsules by mouth 2 (two) times  daily.      Historical Provider, MD  potassium chloride SA (K-DUR,KLOR-CON) 20 MEQ tablet Take 1 tablet (20 mEq total) by mouth daily as needed (with lasix). 09/09/16   Ria Bush, MD  sertraline (ZOLOFT) 100 MG tablet Take 1 tablet (100 mg total) by mouth daily. 09/02/16   Ria Bush, MD  vitamin B-12 (CYANOCOBALAMIN) 500 MCG tablet Take 500 mcg by mouth daily.    Historical Provider, MD  warfarin (COUMADIN) 2.5 MG tablet Take 1 tablet daily except 1 1/2 tablets on Mondays and Thursdays Patient taking differently: Take 3.75 mg by mouth every evening.  04/28/16   Satira Sark, MD    Physical Exam: Vitals:   09/11/16 1215 09/11/16 1230 09/11/16 1239 09/11/16 1300  BP:  147/88  159/92  Pulse: 91 85  97  Resp: 22 19  22   Temp:      TempSrc:      SpO2: 95% 95% 96% 94%  Weight:      Height:          Constitutional: NAD, calm, comfortable Vitals:   09/11/16 1215 09/11/16 1230 09/11/16 1239 09/11/16 1300  BP:  147/88  159/92  Pulse: 91 85  97  Resp: 22 19  22   Temp:      TempSrc:      SpO2: 95% 95% 96% 94%  Weight:      Height:       Eyes: PERRL, lids and conjunctivae normal ENMT: Mucous membranes are moist. Posterior pharynx clear of any exudate or lesions.Normal dentition.  Neck: normal, supple, no masses,  no thyromegaly Respiratory: bibasilar rales, slightly increased work of breathing, pursed lips, good air movement.  Cardiovascular: irregular rhythm, regular rate, no murmurs / rubs / gallops. No extremity edema. 2+ pedal pulses. No carotid bruits.  Abdomen: no tenderness, no masses palpated. No hepatosplenomegaly. Bowel sounds positive.  Musculoskeletal: no clubbing / cyanosis. No joint deformity upper and lower extremities. Good ROM, no contractures. Normal muscle tone.  Skin: no rashes, lesions, ulcers. No induration Neurologic: CN 2-12 grossly intact. Sensation intact, DTR normal. Strength 5/5 in all 4.  Psychiatric: Normal judgment and insight. Alert and oriented x 3. Normal mood.     Labs on Admission: I have personally reviewed following labs and imaging studies  CBC:  Recent Labs Lab 09/11/16 1201  WBC 8.2  HGB 12.4  HCT 36.5  MCV 104.0*  PLT 123456   Basic Metabolic Panel:  Recent Labs Lab 09/11/16 1201  NA 141  K 2.9*  CL 105  CO2 29  GLUCOSE 180*  BUN 14  CREATININE 0.67  CALCIUM 9.3   GFR: Estimated Creatinine Clearance: 37.7 mL/min (by C-G formula based on SCr of 0.67 mg/dL). Liver Function Tests:  Recent Labs Lab 09/11/16 1201  AST 63*  ALT 56*  ALKPHOS 73  BILITOT 1.4*  PROT 6.4*  ALBUMIN 3.3*   No results for input(s): LIPASE, AMYLASE in the last 168 hours. No results for input(s): AMMONIA in the last 168 hours. Coagulation Profile:  Recent Labs Lab 09/07/16 1319 09/11/16 1201  INR 1.9 1.71   Cardiac Enzymes:  Recent Labs Lab 09/11/16 1201  TROPONINI <0.03   BNP (last 3 results) No results for input(s): PROBNP in the last 8760 hours. HbA1C: No results for input(s): HGBA1C in the last 72 hours. CBG: No results for input(s): GLUCAP in the last 168 hours. Lipid Profile: No results for input(s): CHOL, HDL, LDLCALC, TRIG, CHOLHDL, LDLDIRECT in the last 72 hours. Thyroid  Function Tests: No results for input(s): TSH, T4TOTAL, FREET4,  T3FREE, THYROIDAB in the last 72 hours. Anemia Panel: No results for input(s): VITAMINB12, FOLATE, FERRITIN, TIBC, IRON, RETICCTPCT in the last 72 hours. Urine analysis:    Component Value Date/Time   COLORURINE YELLOW 07/26/2016 2219   APPEARANCEUR HAZY (A) 07/26/2016 2219   LABSPEC 1.004 (L) 07/26/2016 2219   PHURINE 8.0 07/26/2016 2219   GLUCOSEU NEGATIVE 07/26/2016 2219   HGBUR NEGATIVE 07/26/2016 2219   BILIRUBINUR NEGATIVE 07/26/2016 2219   KETONESUR NEGATIVE 07/26/2016 2219   PROTEINUR NEGATIVE 07/26/2016 2219   UROBILINOGEN 0.2 12/25/2013 2250   NITRITE NEGATIVE 07/26/2016 2219   LEUKOCYTESUR NEGATIVE 07/26/2016 2219   Sepsis Labs: !!!!!!!!!!!!!!!!!!!!!!!!!!!!!!!!!!!!!!!!!!!! @LABRCNTIP (procalcitonin:4,lacticidven:4) )No results found for this or any previous visit (from the past 240 hour(s)).   Radiological Exams on Admission: Dg Chest 2 View  Result Date: 09/11/2016 CLINICAL DATA:  Shortness of breath for 4 days worse this morning, history hypertension, type II diabetes mellitus, former smoker, chronic atrial fibrillation, COPD, collagen vascular disease EXAM: CHEST  2 VIEW COMPARISON:  07/26/2016 FINDINGS: Enlargement of cardiac silhouette. Atherosclerotic calcification aorta. Mediastinal contours and pulmonary vascularity normal. Bronchitic and chronic interstitial lung disease changes again noted, little changed versus previous exam. Underlying hyperinflation/emphysematous changes without consolidation or pneumothorax. Questionable small LEFT pleural effusion. Diffuse osseous demineralization with thoracolumbar scoliosis, multiple compression fractures, and prior spinal augmentation procedures. IMPRESSION: Enlargement of cardiac silhouette. COPD changes and chronic interstitial lung disease. Questionable tiny LEFT pleural effusion No definite acute infiltrate. Electronically Signed   By: Lavonia Dana M.D.   On: 09/11/2016 12:19    EKG: Independently reviewed. Atrial  fibrillation, premature complexes, minimal ST depression in inferior leads however baseline wander  Assessment/Plan Active Problems:   Hyperlipidemia   Essential hypertension, benign   Persistent atrial fibrillation (HCC)   Hypothyroidism   Diet-controlled diabetes mellitus (HCC)   GERD (gastroesophageal reflux disease)   Chronic nonspecific lung disease (H[facility])   Right heart failure, NYHA class 3   Hypokalemia   Right heart failure, NYHA class 3 - echocardiogram ordered - given 40mg  IV lasix in ED - will start 20mg  IV daily - monitor daily weights - I's and O's - oxygen therapy   Atrial fibrillation - telemetry - pharmacy to dose coumadin  GERD - GI cocktail ordered  Essential HTN - will restart Plendil as indicated  Hypokalemia - history of chronic hypokalemia - patient states she has not been taking her supplementation at home - will start IV and PO potassium replacement - repeat BMP in am  Hypothyroidism - restart levothyroxine - order TSH  Depression - continue zoloft  DVT prophylaxis: On coumadin Code Status:  DNR Family Communication:  Husband is bedside Disposition Plan: Likely discharge in 2-3 days  Consults called: none  Admission status: Inpatient, telemetry    Loretha Stapler MD [facility] Pager [phone]  If 7PM-7AM, please contact night-coverage [email or web address] Password [facility]  09/11/2016, 1:37 PM

## 2016-09-11 NOTE — ED Provider Notes (Signed)
[facility] DEPT Provider Note   CSN: [id number] Arrival date & time: 09/11/16  1134   By signing my name below, I, [name]., attest that this documentation has been prepared under the direction and in the presence of [name], MD. Electronically signed: [name]., ED Scribe. 09/11/16. 1:22 PM.]   History   Chief Complaint Chief Complaint  Patient presents with  . Shortness of Breath    HPI [name] is a 83 y.o. female with hx of A-fib, TIA who presents to the Emergency Department complaining of mild to moderate SOB with sudden onset x1 week.  Pt has had progressively worsening SOB over the past x1 week with associated leg swelling, mild severe with hypoxia. She reportedly takes coumadin. Pt denies cough, fever. She denies hx of MI, stroke, CHF. Pt is a former smoker (20 years) and is on Coumadin. Sx are gradual in onset, persistent, gradually worsening and moderate by arrival No meds pta.   The history is provided by the patient. No language interpreter was used.    Past Medical History:  Diagnosis Date  . Anemia   . Anxiety   . Aortic sclerosis   . Arthritis   . Chronic atrial fibrillation ([place])   . Chronic gastritis 10/2009   Duodenitis on EGD  . CKD (chronic kidney disease) stage 2, GFR 60-89 ml/min   . Collagen vascular disease ([place])   . COPD (chronic obstructive pulmonary disease) ([place])   . Diabetes type 2, controlled ([place])    Diet controlled  . Diverticulosis   . Dry ARMD    Retinal hemorrhage (09/2013) [place]  . Essential hypertension, benign   . GERD (gastroesophageal reflux disease)   . Glaucoma    Dr. Venetia Maxon  . Hiatal hernia 2011   Small  . History of pelvic fracture April 2013  . History of rheumatic fever   . History of skin cancer   . History of TIA (transient ischemic attack)   . Hypothyroidism   . Macular degeneration   . Mixed hyperlipidemia   . Osteoporosis 04/2013   Thoracic compression  fracture, on bisphosphonate and cal/vit D  . Pulmonary nodules    Stable bilateral on CT 01/2010, rec rpt yearly for 2 yrs, pt decided to stop f/u  . Seasonal allergies   . Vertebral compression fracture ([place]) 05/2016   Vertebroplasty L2/L3    Patient Active Problem List   Diagnosis Date Noted  . Hearing loss 09/02/2016  . Left buttock pain 09/02/2016  . Cough 08/03/2016  . Memory deficit 08/03/2016  . Hypokalemia 07/27/2016  . DNR (do not resuscitate) 07/27/2016  . Iron deficiency anemia 01/16/2016  . Compression fracture of vertebrae ([place]) 12/23/2015  . Right heart failure, NYHA class 3 11/22/2015  . Chronic nonspecific lung disease ([place]) 10/17/2015  . Macrocytosis 08/08/2015  . Macular degeneration   . Glaucoma   . Advanced care planning/counseling discussion 04/26/2014  . Skin rash 09/22/2013  . Encounter for therapeutic drug monitoring 08/14/2013  . Depression with anxiety 04/20/2013  . Difficulty with family 04/20/2013  . Medicare annual wellness visit, subsequent 04/03/2012  . Hypothyroidism   . Diet-controlled diabetes mellitus ([place])   . Pulmonary nodules   . Anxiety attack   . GERD (gastroesophageal reflux disease)   . Osteoporosis   . TIA (transient ischemic attack) 05/18/2011  . Weakness 10/13/2010  . Essential hypertension, benign 05/05/2010  . Constipation 03/14/2010  . MITRAL VALVE DISORDER 02/19/2010  . Hyperlipidemia 01/11/2009  .  Persistent atrial fibrillation ([place]) 06/22/2008    Past Surgical History:  Procedure Laterality Date  . BREAST LUMPECTOMY  1983   LEFT, benign  . Carotid US  2011   mild plaque formation  . CATARACT EXTRACTION  2012   RIGHT  . CESAREAN SECTION     (and h/o 6 miscarriages)  . CHOLECYSTECTOMY N/A 02/27/2013   Procedure: LAPAROSCOPIC CHOLECYSTECTOMY;  Surgeon: Donato Heinz, MD;  Location: [facility];  Service: General;  Laterality: N/A;  . COLONOSCOPY  03/2010   int hemorrhoids, diverticulosis, no need to repeat (Dr. Sydell Axon)   . DEXA  05/2010   T score -4.5 spine, -2.4 femur; does not want to repeat  . DEXA  04/2013   T score 3.9 [facility] spine, 2.6 hip  . DILATION AND CURETTAGE OF UTERUS     x2  . ESOPHAGOGASTRODUODENOSCOPY  10/2009   small HH, multiple antric ulcerations s/p biopsy, mild chronic gastritis/duodenitis  . ESOPHAGOGASTRODUODENOSCOPY  05/26/2012   RMR: small HH, o/w normal.   . IR GENERIC HISTORICAL  04/16/2016   IR RADIOLOGIST EVAL & MGMT 04/16/2016 [facility]  . IR GENERIC HISTORICAL  06/16/2016   IR VERTEBROPLASTY LUMBAR BX INC UNI/BIL INC/INJECT/IMAGING 06/16/2016 Luanne Bras, MD [facility]  . IR GENERIC HISTORICAL  06/16/2016   IR VERTEBROPLASTY EA ADDL (T&LS) BX INC UNI/BIL INC INJECT/IMAGING 06/16/2016 Luanne Bras, MD [facility]  . LAPAROSCOPIC CHOLECYSTECTOMY  02/2013   Dr. Geroge Baseman  . PATELLA FRACTURE SURGERY  05/2006   LEFT surgically repaired with pins and wire  . TONSILLECTOMY      OB History    No data available       Home Medications    Prior to Admission medications   Medication Sig Start Date End Date Taking? Authorizing Provider  acetaminophen (TYLENOL) 500 MG tablet Take 2 tablets (1,000 mg total) by mouth 3 (three) times daily. 12/23/15   Ria Bush, MD  calcitonin, salmon, (MIACALCIN/FORTICAL) 200 UNIT/ACT nasal spray Place 1 spray into alternate nostrils daily. 02/24/16   Ria Bush, MD  Calcium-Magnesium-Vitamin D 7180722101 MG-MG-UNIT TABS Take 1 tablet by mouth 2 (two) times daily.    Historical Provider, MD  donepezil (ARICEPT) 5 MG tablet Take 1 tablet (5 mg total) by mouth at bedtime. 09/10/16   Ria Bush, MD  felodipine (PLENDIL) 2.5 MG 24 hr tablet TAKE 1 TABLET DAILY 05/25/16   Ria Bush, MD  ferrous sulfate 325 (65 FE) MG tablet Take 325 mg by mouth daily with breakfast.    Historical Provider, MD  furosemide (LASIX) 20 MG tablet Take 2 tablets (40 mg total) by mouth daily as needed. 06/09/16   Ria Bush, MD    Ipratropium-Albuterol (COMBIVENT RESPIMAT) 20-100 MCG/ACT AERS respimat Inhale 1 puff into the lungs every 6 (six) hours. 08/03/16   Ria Bush, MD  latanoprost (XALATAN) 0.005 % ophthalmic solution Place 1 drop into both eyes at bedtime.     Historical Provider, MD  levothyroxine (SYNTHROID, LEVOTHROID) 50 MCG tablet TAKE 1 TABLET DAILY 06/18/16   Ria Bush, MD  lidocaine (LIDODERM) 5 % Place 1 patch onto the skin daily. Remove & Discard patch within 12 hours or as directed by MD 05/01/16   Ria Bush, MD  LORazepam (ATIVAN) 0.5 MG tablet Take 1 tablet (0.5 mg total) by mouth every 8 (eight) hours as needed for anxiety. 07/17/16   Ria Bush, MD  Misc Natural Products (FIBER 7) POWD Take by mouth daily.    Historical Provider, MD  Multiple Vitamin (  MULTIVITAMIN) capsule Take 1 capsule by mouth every morning.     Historical Provider, MD  Omega-3 Fatty Acids (FISH OIL CONCENTRATE) 1000 MG CAPS Take 2 capsules by mouth 2 (two) times daily.      Historical Provider, MD  potassium chloride SA (K-DUR,KLOR-CON) 20 MEQ tablet Take 1 tablet (20 mEq total) by mouth daily as needed (with lasix). 09/09/16   Ria Bush, MD  sertraline (ZOLOFT) 100 MG tablet Take 1 tablet (100 mg total) by mouth daily. 09/02/16   Ria Bush, MD  vitamin B-12 (CYANOCOBALAMIN) 500 MCG tablet Take 500 mcg by mouth daily.    Historical Provider, MD  warfarin (COUMADIN) 2.5 MG tablet Take 1 tablet daily except 1 1/2 tablets on Mondays and Thursdays Patient taking differently: Take 3.75 mg by mouth every evening.  04/28/16   Satira Sark, MD    Family History Family History  Problem Relation Age of Onset  . Coronary artery disease Mother 56    MI deceased  . Colon cancer Father 44  . Arrhythmia Sister   . Stroke Paternal Grandmother   . Diabetes Neg Hx     Social History Social History  Substance Use Topics  . Smoking status: Former Smoker    Packs/day: 1.00    Years: 20.00     Types: Cigarettes    Quit date: 10/13/1998  . Smokeless tobacco: Never Used  . Alcohol use No     Allergies   Propoxyphene n-acetaminophen; Codeine; Meperidine hcl; Morphine; Shellfish allergy; and Tramadol   Review of Systems Review of Systems  Constitutional: Negative for fever.  Respiratory: Positive for shortness of breath. Negative for cough.   Cardiovascular: Positive for leg swelling.  All other systems reviewed and are negative.    Physical Exam Updated Vital Signs BP 147/88   Pulse 85   Temp 97.5 F (36.4 C) (Oral)   Resp 19   Ht 5\' 2"  (1.575 m)   Wt 113 lb (51.3 kg)   SpO2 96%   BMI 20.67 kg/m   Physical Exam  Constitutional: She appears well-developed and well-nourished. No distress.  HENT:  Head: Normocephalic and atraumatic.  Mouth/Throat: Oropharynx is clear and moist. No oropharyngeal exudate.  Eyes: Conjunctivae and EOM are normal. Pupils are equal, round, and reactive to light. Right eye exhibits no discharge. Left eye exhibits no discharge. No scleral icterus.  Neck: Normal range of motion. Neck supple. No JVD present. No thyromegaly present.  Cardiovascular: Normal rate, normal heart sounds and intact distal pulses.  Exam reveals no gallop and no friction rub.   No murmur heard. A-fib with no JVD.  Pulmonary/Chest: Effort normal. No respiratory distress. She has no wheezes. She has rales.  Rales in lungs bilaterally.   Abdominal: Soft. Bowel sounds are normal. She exhibits no distension and no mass. There is no tenderness.  Musculoskeletal: Normal range of motion. She exhibits edema. She exhibits no tenderness.       Right lower leg: She exhibits edema.       Left lower leg: She exhibits edema.  2+ symmetrical edema of the lower extremities below the knee.  Lymphadenopathy:    She has no cervical adenopathy.  Neurological: She is alert. Coordination normal.  Skin: Skin is warm and dry. No rash noted. No erythema.  Psychiatric: She has a normal  mood and affect. Her behavior is normal.  Nursing note and vitals reviewed.    ED Treatments / Results   DIAGNOSTIC STUDIES: Oxygen Saturation is 88% on  RA, inadequate by my interpretation.   COORDINATION OF CARE: 1:22 PM-Discussed next steps with pt. Pt verbalized understanding and is agreeable with the plan.    Labs (all labs ordered are listed, but only abnormal results are displayed) Labs Reviewed  APTT - Abnormal; Notable for the following:       Result Value   aPTT 38 (*)    All other components within normal limits  CBC - Abnormal; Notable for the following:    RBC 3.51 (*)    MCV 104.0 (*)    MCH 35.3 (*)    RDW 15.7 (*)    All other components within normal limits  COMPREHENSIVE METABOLIC PANEL - Abnormal; Notable for the following:    Potassium 2.9 (*)    Glucose, Bld 180 (*)    Total Protein 6.4 (*)    Albumin 3.3 (*)    AST 63 (*)    ALT 56 (*)    Total Bilirubin 1.4 (*)    All other components within normal limits  PROTIME-INR - Abnormal; Notable for the following:    Prothrombin Time 20.3 (*)    All other components within normal limits  BRAIN NATRIURETIC PEPTIDE - Abnormal; Notable for the following:    B Natriuretic Peptide 723.0 (*)    All other components within normal limits  TROPONIN I    EKG  EKG Interpretation  Date/Time:  Friday September 11 2016 11:44:06 EST Ventricular Rate:  96 PR Interval:    QRS Duration: 91 QT Interval:  385 QTC Calculation: 487 R Axis:   65 Text Interpretation:  Atrial fibrillation Ventricular premature complex Low voltage, extremity leads Minimal ST depression, inferior leads Borderline prolonged QT interval since last tracing no significant change Confirmed by Desmund Elman  MD, Antonette Hendricks ([id number]) on 09/11/2016 12:06:13 PM       Radiology Dg Chest 2 View  Result Date: 09/11/2016 CLINICAL DATA:  Shortness of breath for 4 days worse this morning, history hypertension, type II diabetes mellitus, former smoker, chronic  atrial fibrillation, COPD, collagen vascular disease EXAM: CHEST  2 VIEW COMPARISON:  07/26/2016 FINDINGS: Enlargement of cardiac silhouette. Atherosclerotic calcification aorta. Mediastinal contours and pulmonary vascularity normal. Bronchitic and chronic interstitial lung disease changes again noted, little changed versus previous exam. Underlying hyperinflation/emphysematous changes without consolidation or pneumothorax. Questionable small LEFT pleural effusion. Diffuse osseous demineralization with thoracolumbar scoliosis, multiple compression fractures, and prior spinal augmentation procedures. IMPRESSION: Enlargement of cardiac silhouette. COPD changes and chronic interstitial lung disease. Questionable tiny LEFT pleural effusion No definite acute infiltrate. Electronically Signed   By: Lavonia Dana M.D.   On: 09/11/2016 12:19    Procedures Procedures (including critical care time)  Medications Ordered in ED Medications  furosemide (LASIX) injection 40 mg (not administered)  albuterol (PROVENTIL) (2.5 MG/3ML) 0.083% nebulizer solution 5 mg (5 mg Nebulization Given 09/11/16 1237)  aspirin chewable tablet 324 mg (324 mg Oral Given 09/11/16 1218)  methylPREDNISolone sodium succinate (SOLU-MEDROL) 125 mg/2 mL injection 125 mg (125 mg Intravenous Given 09/11/16 1255)     Initial Impression / Assessment and Plan / ED Course  I have reviewed the triage vital signs and the nursing notes.  Pertinent labs & imaging results that were available during my care of the patient were reviewed by me and considered in my medical decision making (see chart for details).     Has singns of CHF BNP elevated Trop neg CBC metabolic normal Lungs with ILD ? Effusions and COPD D/w hospitalist who will admit.  Final Clinical Impressions(s) / ED Diagnoses   Final diagnoses:  Respiratory distress  Acute congestive heart failure, unspecified congestive heart failure type [facility])    New Prescriptions New  Prescriptions   No medications on file   I personally performed the services described in this documentation, which was scribed in my presence. The recorded information has been reviewed and is accurate.       [name], MD 09/11/16 1322

## 2016-09-12 ENCOUNTER — Encounter (HOSPITAL_COMMUNITY): Payer: Self-pay

## 2016-09-12 ENCOUNTER — Inpatient Hospital Stay (HOSPITAL_COMMUNITY): Payer: Medicare Other

## 2016-09-12 DIAGNOSIS — I509 Heart failure, unspecified: Secondary | ICD-10-CM

## 2016-09-12 LAB — PROTIME-INR
INR: 1.81
PROTHROMBIN TIME: 21.2 s — AB (ref 11.4–15.2)

## 2016-09-12 LAB — BASIC METABOLIC PANEL
Anion gap: 7 (ref 5–15)
BUN: 21 mg/dL — ABNORMAL HIGH (ref 6–20)
CO2: 28 mmol/L (ref 22–32)
CREATININE: 0.55 mg/dL (ref 0.44–1.00)
Calcium: 8.5 mg/dL — ABNORMAL LOW (ref 8.9–10.3)
Chloride: 105 mmol/L (ref 101–111)
GFR calc non Af Amer: >60 mL/min (ref >60)
Glucose, Bld: 177 mg/dL — ABNORMAL HIGH (ref 65–99)
Potassium: 3.9 mmol/L (ref 3.5–5.1)
Sodium: 140 mmol/L (ref 135–145)

## 2016-09-12 LAB — GLUCOSE, CAPILLARY
GLUCOSE-CAPILLARY: 125 mg/dL — AB (ref 65–99)
Glucose-Capillary: 190 mg/dL — ABNORMAL HIGH (ref 65–99)

## 2016-09-12 LAB — ECHOCARDIOGRAM COMPLETE
Height: 62 in
WEIGHTICAEL: 1808 [oz_av]

## 2016-09-12 LAB — TROPONIN I: Troponin I: <0.03 ng/mL (ref <0.03)

## 2016-09-12 MED ORDER — GLUCERNA SHAKE PO LIQD
237.0000 mL | Freq: Two times a day (BID) | ORAL | Status: DC
  Administered 2016-09-12 – 2016-09-13 (×2): 237 mL via ORAL

## 2016-09-12 MED ORDER — ENSURE ENLIVE PO LIQD: 237.0000 mL | Freq: Two times a day (BID) | ORAL | Status: DC

## 2016-09-12 MED ORDER — WARFARIN SODIUM 2 MG PO TABS
4.0000 mg | ORAL_TABLET | Freq: Once | ORAL | Status: AC
  Administered 2016-09-12: 4 mg via ORAL
  Filled 2016-09-12: qty 2

## 2016-09-12 NOTE — Progress Notes (Signed)
  Echocardiogram 2D Echocardiogram has been performed.  [name] 09/12/2016, 11:21 AM

## 2016-09-12 NOTE — Progress Notes (Signed)
[place] for Coumadin Indication: atrial fibrillation  Allergies  Allergen Reactions  . Propoxyphene N-Acetaminophen Shortness Of Breath  . Codeine Nausea And Vomiting  . Meperidine Hcl Nausea And Vomiting  . Morphine Nausea And Vomiting  . Shellfish Allergy Hives  . Tramadol Nausea Only    Patient Measurements: Height: 5\' 2"  (157.5 cm) Weight: 113 lb (51.3 kg) IBW/kg (Calculated) : 50.1  Vital Signs: Temp: 98.7 F (37.1 C) (02/24 0636) Temp Source: Oral (02/24 0636) BP: 144/73 (02/24 0636) Pulse Rate: 88 (02/24 0636)  Labs:  Recent Labs  09/11/16 1201 09/11/16 1801 09/11/16 2258 09/12/16 0451  HGB 12.4  --   --   --   HCT 36.5  --   --   --   PLT 304  --   --   --   APTT 38*  --   --   --   LABPROT 20.3*  --   --  21.2*  INR 1.71  --   --  1.81  CREATININE 0.67  --   --  0.55  TROPONINI <0.03 0.03* <0.03 <0.03    Estimated Creatinine Clearance: 37.7 mL/min (by C-G formula based on SCr of 0.55 mg/dL).   Medical History: Past Medical History:  Diagnosis Date  . Anemia   . Anxiety   . Aortic sclerosis   . Arthritis   . Chronic atrial fibrillation ([place])   . Chronic gastritis 10/2009   Duodenitis on EGD  . CKD (chronic kidney disease) stage 2, GFR 60-89 ml/min   . Collagen vascular disease ([place])   . COPD (chronic obstructive pulmonary disease) ([place])   . Diabetes type 2, controlled ([place])    Diet controlled  . Diverticulosis   . Dry ARMD    Retinal hemorrhage (09/2013) [place]  . Essential hypertension, benign   . GERD (gastroesophageal reflux disease)   . Glaucoma    Dr. Venetia Maxon  . Hiatal hernia 2011   Small  . History of pelvic fracture April 2013  . History of rheumatic fever   . History of skin cancer   . History of TIA (transient ischemic attack)   . Hypothyroidism   . Macular degeneration   . Mixed hyperlipidemia   . Osteoporosis 04/2013   Thoracic compression fracture, on bisphosphonate and cal/vit D   . Pulmonary nodules    Stable bilateral on CT 01/2010, rec rpt yearly for 2 yrs, pt decided to stop f/u  . Seasonal allergies   . Vertebral compression fracture ([place]) 05/2016   Vertebroplasty L2/L3    Medications:  Prescriptions Prior to Admission  Medication Sig Dispense Refill Last Dose  . acetaminophen (TYLENOL) 500 MG tablet Take 2 tablets (1,000 mg total) by mouth 3 (three) times daily.   Past Week at Unknown time  . donepezil (ARICEPT) 5 MG tablet Take 1 tablet (5 mg total) by mouth at bedtime. 30 tablet 3 unknown  . felodipine (PLENDIL) 2.5 MG 24 hr tablet TAKE 1 TABLET DAILY 90 tablet 1 Past Week at Unknown time  . furosemide (LASIX) 20 MG tablet Take 2 tablets (40 mg total) by mouth daily as needed. 180 tablet 0 unknown  . levothyroxine (SYNTHROID, LEVOTHROID) 50 MCG tablet TAKE 1 TABLET DAILY 90 tablet 3 Past Week at Unknown time  . Omega-3 Fatty Acids (FISH OIL CONCENTRATE) 1000 MG CAPS Take 2 capsules by mouth 2 (two) times daily.     Past Week at Unknown time  . calcitonin, salmon, (MIACALCIN/FORTICAL) 200 UNIT/ACT  nasal spray Place 1 spray into alternate nostrils daily. (Patient not taking: Reported on 09/11/2016) 3.7 mL 12 Not Taking at Unknown time  . Calcium-Magnesium-Vitamin D [id number] MG-MG-UNIT TABS Take 1 tablet by mouth 2 (two) times daily.   Taking  . ferrous sulfate 325 (65 FE) MG tablet Take 325 mg by mouth daily with breakfast.   Taking  . Ipratropium-Albuterol (COMBIVENT RESPIMAT) 20-100 MCG/ACT AERS respimat Inhale 1 puff into the lungs every 6 (six) hours. 4 g 3 Taking  . latanoprost (XALATAN) 0.005 % ophthalmic solution Place 1 drop into both eyes at bedtime.    Taking  . lidocaine (LIDODERM) 5 % Place 1 patch onto the skin daily. Remove & Discard patch within 12 hours or as directed by MD 30 patch 6 Taking  . LORazepam (ATIVAN) 0.5 MG tablet Take 1 tablet (0.5 mg total) by mouth every 8 (eight) hours as needed for anxiety. 60 tablet 0 Taking  . Misc Natural  Products (FIBER 7) POWD Take by mouth daily.   Taking  . Multiple Vitamin (MULTIVITAMIN) capsule Take 1 capsule by mouth every morning.    Taking  . potassium chloride SA (K-DUR,KLOR-CON) 20 MEQ tablet Take 1 tablet (20 mEq total) by mouth daily as needed (with lasix). 30 tablet 1   . sertraline (ZOLOFT) 100 MG tablet Take 1 tablet (100 mg total) by mouth daily. 90 tablet 1 Taking  . vitamin B-12 (CYANOCOBALAMIN) 500 MCG tablet Take 500 mcg by mouth daily.   Taking  . warfarin (COUMADIN) 2.5 MG tablet Take 1 tablet daily except 1 1/2 tablets on Mondays and Thursdays (Patient taking differently: Take 3.75 mg by mouth every evening. ) 180 tablet 3 Taking    Assessment: 83yo female on chronic Coumadin PTA.  INR below goal on admission.  Goal of Therapy:  INR 2-3 Monitor platelets by anticoagulation protocol: Yes   Plan:  Coumadin 4mg  today x 1 INR daily  [name], [name] A 09/12/2016,8:29 AM

## 2016-09-12 NOTE — Progress Notes (Addendum)
Initial Nutrition Assessment  DOCUMENTATION CODES:  Not applicable  INTERVENTION:  Glucerna Shake po BID, each supplement provides 220 kcal and 10 grams of protein  NUTRITION DIAGNOSIS:  Increased nutrient needs related to chronic illness (COPD, CHF, DM, ) as evidenced by estimated nutritional requirements for these chronic disease states  GOAL:  Patient will meet greater than or equal to 90% of their needs  MONITOR:  PO intake, Supplement acceptance, Labs, Weight trends  REASON FOR ASSESSMENT:  Consult COPD Protocol  ASSESSMENT:  83 y/o female PMHx Afib, Anxiety, Depression, CKD, COPD, HTN, HLD, CHF, DM2, GERD, nonspecific lung disease who presents with SOB and ankle swelling 1 week ago. Does not wear o2 at home. Admitted for CHF exacerbation  RD operating remotely. Per chart review. Pt had declined n/v/c/d. There was no mention of poor PO intake.   Though pt's weight appears to vary 5-15 lbs, trend does appear to be somewhat stable over the past few years.   Only one documented meal intake at this time of 25%. Will add Glucerna BID.   Please consult RD for diet ed if feel pt's diet is not ideal for chronic disease states, however, noted pts age.   NFPE: Unable to assess  Labs: Glu- 150-250, Albumin:3.3,  Medications: Aricept, Lasix, KCL, prednisone, Warfarin,    Recent Labs Lab 09/11/16 1201 09/12/16 0451  NA 141 140  K 2.9* 3.9  CL 105 105  CO2 29 28  BUN 14 21*  CREATININE 0.67 0.55  CALCIUM 9.3 8.5*  GLUCOSE 180* 177*    Diet Order:  Diet Heart Room service appropriate? Yes; Fluid consistency: Thin  Skin:  Reviewed, no issues  Last BM:  Unknown  Height:  Ht Readings from Last 1 Encounters:  09/11/16 5\' 2"  (1.575 m)   Weight:  Wt Readings from Last 1 Encounters:  09/11/16 113 lb (51.3 kg)   Wt Readings from Last 10 Encounters:  09/11/16 113 lb (51.3 kg)  09/02/16 113 lb 8 oz (51.5 kg)  09/02/16 113 lb 8 oz (51.5 kg)  08/19/16 106 lb (48.1 kg)   08/03/16 102 lb 8 oz (46.5 kg)  07/27/16 102 lb 1.2 oz (46.3 kg)  07/21/16 104 lb (47.2 kg)  07/12/16 104 lb (47.2 kg)  06/16/16 109 lb (49.4 kg)  06/01/16 109 lb (49.4 kg)   Ideal Body Weight:  50 kg  BMI:  Body mass index is 20.67 kg/m.  Estimated Nutritional Needs:  Kcal:  1450-1650 (28-32 kcal/kg bw) Protein:  62-72 g Pro (1.2-1.4 g/kg bw) Fluid:  Per MD  EDUCATION NEEDS:  No education needs identified at this time  Burtis Junes RD, LDN, [place] Clinical Nutrition Pager: [phone] 09/12/2016 12:07 PM

## 2016-09-12 NOTE — Progress Notes (Signed)
PROGRESS NOTE    [name]  [id number] DOB: 1926-07-12 DOA: 09/11/2016 PCP: Ria Bush, MD    Brief Narrative:  [name] is a 83 y.o. female with medical history significant of atrial fibrillation, CHF, HTN, hypothyroidism, DM Type II, GERD, and nonspecific lung disease presents for worsening shortness of breath for 1 week.  Patient reports that her ankles started swelling 1 week ago and that she became increasingly short of breath as the week went on.  She said she felt like she needed to come in a day earlier due to her shortness of breath.  She does not wear oxygen at home.  Patient reports swelling has increased from her feet, to her ankles to her legs.  She reports she has been able to lay flat to sleep- she sleeps on her side.  No fevers, chills, no cough, no chest pain or pain with breathing.  No nausea or vomiting.  No recent travel and no sick contacts.  Never had previous hospitalizations due to congestive heart failure.  Patient denies history of underlying pulmonary disease.   Assessment & Plan:   Principal Problem:   Acute CHF (congestive heart failure) (HCC) Active Problems:   Hyperlipidemia   Essential hypertension, benign   Persistent atrial fibrillation (HCC)   Hypothyroidism   Diet-controlled diabetes mellitus (HCC)   GERD (gastroesophageal reflux disease)   Chronic nonspecific lung disease (HCC)   Right heart failure, NYHA class 3   Hypokalemia   Right heart failure, NYHA class 3 - echocardiogram: Left ventricle: The cavity size was normal. Wall thickness was increased in a pattern of moderate LVH. Systolic function was normal. The estimated ejection fraction was in the range of 60% to 65%. Wall motion was normal; there were no regional wall motion abnormalities. The study was not technically sufficient to allow evaluation of LV diastolic dysfunction due to atrial fibrillation. Aortic valve: Moderately calcified annulus. Moderately thickened   leaflets. Uncertain number of leaflets, possible bicuspid aortic valve. Mean gradient (S): 2 mm Hg. Valve area (VTI): 2.72 cm^2. Valve area (Vmax): 2.86 cm^2. Mitral valve: Mildly calcified annulus. Mildly thickened leaflets. There was mild regurgitation. Left atrium: The atrium was severely dilated. Right ventricle: The cavity size was moderately dilated. Systolic function was moderately to severely reduced. Right atrium: The atrium was severely dilated. Tricuspid valve: There was moderate regurgitation. Pulmonary arteries: Systolic pressure was severely increased. PA peak pressure: 86 mm Hg (S). - given 40mg  IV lasix in ED - will start 20mg  IV daily - monitor daily weights - BMP in am - I's and O's (will try for strict) - oxygen therapy   Atrial fibrillation - telemetry - pharmacy to dose coumadin  GERD - GI cocktail ordered  Essential HTN - will restart Plendil as indicated  Hypokalemia - history of chronic hypokalemia - patient states she has not been taking her supplementation at home - PO supplementation - repeat BMP in am  Hypothyroidism - restart levothyroxine - order TSH  Depression - continue zoloft  DVT prophylaxis: On coumadin Code Status:  DNR Family Communication:  Husband is bedside Disposition Plan: Likely discharge in 1-2 days    Consultants:   None  Procedures:   Echocardiogram 2/24  Antimicrobials:   None    Subjective: Patient seen and says she did not sleep well- calling bed less than comfortable.  Reports that she has been told breathing is loud and labored prior to admission but her breathing does not bother her.  Objective: Vitals:  09/12/16 0636 09/12/16 0729 09/12/16 1303 09/12/16 1358  BP: (!) 144/73   133/69  Pulse: 88   90  Resp: 18   18  Temp: 98.7 F (37.1 C)   98.4 F (36.9 C)  TempSrc: Oral   Oral  SpO2: 94% 97% 92% 94%  Weight:      Height:        Intake/Output Summary (Last 24 hours) at 09/12/16  1729 Last data filed at 09/12/16 1157  Gross per 24 hour  Intake              120 ml  Output             1325 ml  Net            -1205 ml   Filed Weights   09/11/16 1145  Weight: 51.3 kg (113 lb)    Examination:  General exam: Appears calm and comfortable  Respiratory system: Rales at bases, otherwise clear, no increased work of breathing, nasal cannula in place Cardiovascular system: S1 & S2 heard, RRR. No JVD, murmurs, rubs, gallops or clicks. No pedal edema. Gastrointestinal system: Abdomen is nondistended, soft and nontender. No organomegaly or masses felt. Normal bowel sounds heard. Central nervous system: Alert and oriented. No focal neurological deficits. Extremities: Symmetric 5 x 5 power. Skin: No rashes, lesions or ulcers Psychiatry: Judgement and insight appear normal. Mood & affect appropriate.     Data Reviewed: I have personally reviewed following labs and imaging studies  CBC:  Recent Labs Lab 09/11/16 1201  WBC 8.2  HGB 12.4  HCT 36.5  MCV 104.0*  PLT 123456   Basic Metabolic Panel:  Recent Labs Lab 09/11/16 1201 09/12/16 0451  NA 141 140  K 2.9* 3.9  CL 105 105  CO2 29 28  GLUCOSE 180* 177*  BUN 14 21*  CREATININE 0.67 0.55  CALCIUM 9.3 8.5*   GFR: Estimated Creatinine Clearance: 37.7 mL/min (by C-G formula based on SCr of 0.55 mg/dL). Liver Function Tests:  Recent Labs Lab 09/11/16 1201  AST 63*  ALT 56*  ALKPHOS 73  BILITOT 1.4*  PROT 6.4*  ALBUMIN 3.3*   No results for input(s): LIPASE, AMYLASE in the last 168 hours. No results for input(s): AMMONIA in the last 168 hours. Coagulation Profile:  Recent Labs Lab 09/07/16 1319 09/11/16 1201 09/12/16 0451  INR 1.9 1.71 1.81   Cardiac Enzymes:  Recent Labs Lab 09/11/16 1201 09/11/16 1801 09/11/16 2258 09/12/16 0451  TROPONINI <0.03 0.03* <0.03 <0.03   BNP (last 3 results) No results for input(s): PROBNP in the last 8760 hours. HbA1C: No results for input(s): HGBA1C  in the last 72 hours. CBG:  Recent Labs Lab 09/11/16 1639 09/11/16 2142 09/12/16 1709  GLUCAP 206* 243* 190*   Lipid Profile: No results for input(s): CHOL, HDL, LDLCALC, TRIG, CHOLHDL, LDLDIRECT in the last 72 hours. Thyroid Function Tests:  Recent Labs  09/11/16 1801  TSH 2.222   Anemia Panel: No results for input(s): VITAMINB12, FOLATE, FERRITIN, TIBC, IRON, RETICCTPCT in the last 72 hours. Sepsis Labs: No results for input(s): PROCALCITON, LATICACIDVEN in the last 168 hours.  No results found for this or any previous visit (from the past 240 hour(s)).       Radiology Studies: Dg Chest 2 View  Result Date: 09/11/2016 CLINICAL DATA:  Shortness of breath for 4 days worse this morning, history hypertension, type II diabetes mellitus, former smoker, chronic atrial fibrillation, COPD, collagen vascular disease EXAM: CHEST  2  VIEW COMPARISON:  07/26/2016 FINDINGS: Enlargement of cardiac silhouette. Atherosclerotic calcification aorta. Mediastinal contours and pulmonary vascularity normal. Bronchitic and chronic interstitial lung disease changes again noted, little changed versus previous exam. Underlying hyperinflation/emphysematous changes without consolidation or pneumothorax. Questionable small LEFT pleural effusion. Diffuse osseous demineralization with thoracolumbar scoliosis, multiple compression fractures, and prior spinal augmentation procedures. IMPRESSION: Enlargement of cardiac silhouette. COPD changes and chronic interstitial lung disease. Questionable tiny LEFT pleural effusion No definite acute infiltrate. Electronically Signed   By: Lavonia Dana M.D.   On: 09/11/2016 12:19        Scheduled Meds: . aspirin  81 mg Oral Daily  . donepezil  5 mg Oral QHS  . enoxaparin (LOVENOX) injection  40 mg Subcutaneous Q24H  . feeding supplement (GLUCERNA SHAKE)  237 mL Oral BID BM  . furosemide  20 mg Intravenous Daily  . ipratropium-albuterol  3 mL Nebulization Q6H  .  potassium chloride  40 mEq Oral TID  . predniSONE  40 mg Oral Q breakfast  . sertraline  100 mg Oral Daily  . Warfarin - Pharmacist Dosing Inpatient   Does not apply Q24H   Continuous Infusions:   LOS: 1 day    Time spent: 30 minutes    Loretha Stapler, MD [facility] Pager [phone]  If 7PM-7AM, please contact night-coverage [email or web address] Password [facility] 09/12/2016, 5:29 PM

## 2016-09-13 ENCOUNTER — Encounter (HOSPITAL_COMMUNITY): Payer: Self-pay

## 2016-09-13 LAB — BASIC METABOLIC PANEL
Anion gap: 7 (ref 5–15)
BUN: 23 mg/dL — ABNORMAL HIGH (ref 6–20)
CALCIUM: 8.3 mg/dL — AB (ref 8.9–10.3)
CO2: 26 mmol/L (ref 22–32)
CREATININE: 0.55 mg/dL (ref 0.44–1.00)
Chloride: 107 mmol/L (ref 101–111)
GFR calc Af Amer: >60 mL/min (ref >60)
GFR calc non Af Amer: >60 mL/min (ref >60)
GLUCOSE: 102 mg/dL — AB (ref 65–99)
Potassium: 4.3 mmol/L (ref 3.5–5.1)
Sodium: 140 mmol/L (ref 135–145)

## 2016-09-13 LAB — PROTIME-INR
INR: 2.2
Prothrombin Time: 24.8 seconds — ABNORMAL HIGH (ref 11.4–15.2)

## 2016-09-13 MED ORDER — IPRATROPIUM-ALBUTEROL 0.5-2.5 (3) MG/3ML IN SOLN
3.0000 mL | Freq: Three times a day (TID) | RESPIRATORY_TRACT | Status: DC
  Administered 2016-09-13 – 2016-09-15 (×8): 3 mL via RESPIRATORY_TRACT
  Filled 2016-09-13 (×8): qty 3

## 2016-09-13 MED ORDER — POTASSIUM CHLORIDE CRYS ER 20 MEQ PO TBCR
40.0000 meq | EXTENDED_RELEASE_TABLET | Freq: Two times a day (BID) | ORAL | Status: DC
  Administered 2016-09-13 – 2016-09-15 (×4): 40 meq via ORAL
  Filled 2016-09-13 (×4): qty 2

## 2016-09-13 MED ORDER — WARFARIN SODIUM 2 MG PO TABS
4.0000 mg | ORAL_TABLET | Freq: Once | ORAL | Status: AC
  Administered 2016-09-13: 4 mg via ORAL
  Filled 2016-09-13: qty 2

## 2016-09-13 NOTE — Progress Notes (Signed)
[place] for Coumadin Indication: atrial fibrillation  Allergies  Allergen Reactions  . Propoxyphene N-Acetaminophen Shortness Of Breath  . Codeine Nausea And Vomiting  . Meperidine Hcl Nausea And Vomiting  . Morphine Nausea And Vomiting  . Shellfish Allergy Hives  . Tramadol Nausea Only   Patient Measurements: Height: 5\' 2"  (157.5 cm) Weight: 113 lb (51.3 kg) IBW/kg (Calculated) : 50.1  Vital Signs: Temp: 98.9 F (37.2 C) (02/25 0708) Temp Source: Oral (02/25 0708) BP: 134/74 (02/25 0708) Pulse Rate: 87 (02/25 0708)  Labs:  Recent Labs  09/11/16 1201 09/11/16 1801 09/11/16 2258 09/12/16 0451 09/13/16 0651  HGB 12.4  --   --   --   --   HCT 36.5  --   --   --   --   PLT 304  --   --   --   --   APTT 38*  --   --   --   --   LABPROT 20.3*  --   --  21.2* 24.8*  INR 1.71  --   --  1.81 2.20  CREATININE 0.67  --   --  0.55 0.55  TROPONINI <0.03 0.03* <0.03 <0.03  --    Estimated Creatinine Clearance: 37.7 mL/min (by C-G formula based on SCr of 0.55 mg/dL).  Medical History: Past Medical History:  Diagnosis Date  . Anemia   . Anxiety   . Aortic sclerosis   . Arthritis   . Chronic atrial fibrillation ([place])   . Chronic gastritis 10/2009   Duodenitis on EGD  . CKD (chronic kidney disease) stage 2, GFR 60-89 ml/min   . Collagen vascular disease ([place])   . COPD (chronic obstructive pulmonary disease) ([place])   . Diabetes type 2, controlled ([place])    Diet controlled  . Diverticulosis   . Dry ARMD    Retinal hemorrhage (09/2013) [place]  . Essential hypertension, benign   . GERD (gastroesophageal reflux disease)   . Glaucoma    Dr. Venetia Maxon  . Hiatal hernia 2011   Small  . History of pelvic fracture April 2013  . History of rheumatic fever   . History of skin cancer   . History of TIA (transient ischemic attack)   . Hypothyroidism   . Macular degeneration   . Mixed hyperlipidemia   . Osteoporosis 04/2013   Thoracic  compression fracture, on bisphosphonate and cal/vit D  . Pulmonary nodules    Stable bilateral on CT 01/2010, rec rpt yearly for 2 yrs, pt decided to stop f/u  . Seasonal allergies   . Vertebral compression fracture ([place]) 05/2016   Vertebroplasty L2/L3   Medications:  Prescriptions Prior to Admission  Medication Sig Dispense Refill Last Dose  . acetaminophen (TYLENOL) 500 MG tablet Take 2 tablets (1,000 mg total) by mouth 3 (three) times daily.   Past Week at Unknown time  . donepezil (ARICEPT) 5 MG tablet Take 1 tablet (5 mg total) by mouth at bedtime. 30 tablet 3 unknown  . felodipine (PLENDIL) 2.5 MG 24 hr tablet TAKE 1 TABLET DAILY 90 tablet 1 Past Week at Unknown time  . furosemide (LASIX) 20 MG tablet Take 2 tablets (40 mg total) by mouth daily as needed. 180 tablet 0 unknown  . levothyroxine (SYNTHROID, LEVOTHROID) 50 MCG tablet TAKE 1 TABLET DAILY 90 tablet 3 Past Week at Unknown time  . Omega-3 Fatty Acids (FISH OIL CONCENTRATE) 1000 MG CAPS Take 2 capsules by mouth 2 (two) times daily.  Past Week at Unknown time  . calcitonin, salmon, (MIACALCIN/FORTICAL) 200 UNIT/ACT nasal spray Place 1 spray into alternate nostrils daily. (Patient not taking: Reported on 09/11/2016) 3.7 mL 12 Not Taking at Unknown time  . Calcium-Magnesium-Vitamin D [id number] MG-MG-UNIT TABS Take 1 tablet by mouth 2 (two) times daily.   Taking  . ferrous sulfate 325 (65 FE) MG tablet Take 325 mg by mouth daily with breakfast.   Taking  . Ipratropium-Albuterol (COMBIVENT RESPIMAT) 20-100 MCG/ACT AERS respimat Inhale 1 puff into the lungs every 6 (six) hours. 4 g 3 Taking  . latanoprost (XALATAN) 0.005 % ophthalmic solution Place 1 drop into both eyes at bedtime.    Taking  . lidocaine (LIDODERM) 5 % Place 1 patch onto the skin daily. Remove & Discard patch within 12 hours or as directed by MD 30 patch 6 Taking  . LORazepam (ATIVAN) 0.5 MG tablet Take 1 tablet (0.5 mg total) by mouth every 8 (eight) hours as needed  for anxiety. 60 tablet 0 Taking  . Misc Natural Products (FIBER 7) POWD Take by mouth daily.   Taking  . Multiple Vitamin (MULTIVITAMIN) capsule Take 1 capsule by mouth every morning.    Taking  . potassium chloride SA (K-DUR,KLOR-CON) 20 MEQ tablet Take 1 tablet (20 mEq total) by mouth daily as needed (with lasix). 30 tablet 1   . sertraline (ZOLOFT) 100 MG tablet Take 1 tablet (100 mg total) by mouth daily. 90 tablet 1 Taking  . vitamin B-12 (CYANOCOBALAMIN) 500 MCG tablet Take 500 mcg by mouth daily.   Taking  . warfarin (COUMADIN) 2.5 MG tablet Take 1 tablet daily except 1 1/2 tablets on Mondays and Thursdays (Patient taking differently: Take 3.75 mg by mouth every evening. ) 180 tablet 3 Taking   Assessment: 83yo female on chronic Coumadin PTA.  INR is therapeutic (2.2)  Goal of Therapy:  INR 2-3 Monitor platelets by anticoagulation protocol: Yes   Plan:  Coumadin 4mg  today x 1 INR daily  Bryon Parker A 09/13/2016,9:38 AM

## 2016-09-13 NOTE — Progress Notes (Signed)
PROGRESS NOTE    [name]  [id number] DOB: [birth year] DOA: 09/11/2016 PCP: Ria Bush, MD    Brief Narrative:  [name] is a 83 y.o. female with medical history significant of atrial fibrillation, CHF, HTN, hypothyroidism, DM Type II, GERD, and nonspecific lung disease presents for worsening shortness of breath for 1 week.  Patient reports that her ankles started swelling 1 week ago and that she became increasingly short of breath as the week went on.  She said she felt like she needed to come in a day earlier due to her shortness of breath.  She does not wear oxygen at home.  Patient reports swelling has increased from her feet, to her ankles to her legs.  She reports she has been able to lay flat to sleep- she sleeps on her side.  No fevers, chills, no cough, no chest pain or pain with breathing.  No nausea or vomiting.  No recent travel and no sick contacts.  Never had previous hospitalizations due to congestive heart failure.  Patient denies history of underlying pulmonary disease.   Assessment & Plan:   Principal Problem:   Acute CHF (congestive heart failure) (HCC) Active Problems:   Hyperlipidemia   Essential hypertension, benign   Persistent atrial fibrillation (HCC)   Hypothyroidism   Diet-controlled diabetes mellitus (HCC)   GERD (gastroesophageal reflux disease)   Chronic nonspecific lung disease (HCC)   Right heart failure, NYHA class 3   Hypokalemia   Right heart failure, NYHA class 3 - echocardiogram: Left ventricle: The cavity size was normal. Wall thickness was increased in a pattern of moderate LVH. Systolic function was normal. The estimated ejection fraction was in the range of 60% to 65%. Wall motion was normal; there were no regional wall motion abnormalities. The study was not technically sufficient to allow evaluation of LV diastolic dysfunction due to atrial fibrillation. Aortic valve: Moderately calcified annulus. Moderately thickened  leaflets. Uncertain number of leaflets, possible bicuspid aortic valve. Mean gradient (S): 2 mm Hg. Valve area (VTI): 2.72 cm^2. Valve area (Vmax): 2.86 cm^2. Mitral valve: Mildly calcified annulus. Mildly thickened leaflets. There was mild regurgitation. Left atrium: The atrium was severely dilated. Right ventricle: The cavity size was moderately dilated. Systolic function was moderately to severely reduced. Right atrium: The atrium was severely dilated. Tricuspid valve: There was moderate regurgitation. Pulmonary arteries: Systolic pressure was severely increased. PA peak pressure: 86 mm Hg (S). - given 40mg  IV lasix in ED - will start 20mg  IV daily - monitor daily weights (not done so reordered) - BMP in am - I's and O's (-631ml balance yesterday) - oxygen therapy  - add incentive spirometry  Debility - PT eval ordered  Atrial fibrillation - telemetry - pharmacy to dose coumadin (lovenox as INR not therapeutic)  GERD - GI cocktail ordered  Essential HTN - will restart Plendil as indicated  Hypokalemia - history of chronic hypokalemia - patient states she has not been taking her supplementation at home - PO supplementation - improved to 4.3 today - decreased K supplementation to BID (from TID)  Hypothyroidism - restart levothyroxine - TSH of 2.22  Depression - continue zoloft  DVT prophylaxis: On coumadin Code Status:  DNR Family Communication:  Husband is bedside Disposition Plan: discharge when breathing status improved/ stabilized    Consultants:   None  Procedures:   Echocardiogram 2/24  Antimicrobials:   None    Subjective: Patient reports she does not feel as though her breathing has improved.  States she is still breathing shallow but is able to speak in full sentences.  Denies chest pain or chest pressure.  Cannot recall if she was able to lay down flat to sleep.  PT eval ordered.  Will to try Incentive spirometer.  Objective: Vitals:    09/12/16 2035 09/12/16 2049 09/13/16 0708 09/13/16 0743  BP:  131/69 134/74   Pulse:  90 87   Resp:  18 18   Temp:  98.7 F (37.1 C) 98.9 F (37.2 C)   TempSrc:  Oral Oral   SpO2: 91% 95% 97% 90%  Weight:      Height:        Intake/Output Summary (Last 24 hours) at 09/13/16 0958 Last data filed at 09/13/16 0013  Gross per 24 hour  Intake              360 ml  Output              975 ml  Net             -615 ml   Filed Weights   09/11/16 1145  Weight: 51.3 kg (113 lb)    Examination:  General exam: Appears calm and comfortable  Respiratory system: Rales at bases, otherwise clear, no increased work of breathing, nasal cannula in place Cardiovascular system: S1 & S2 heard, RRR. No JVD, murmurs, rubs, gallops or clicks. No pedal edema. Gastrointestinal system: Abdomen is nondistended, soft and nontender. No organomegaly or masses felt. Normal bowel sounds heard. Central nervous system: Alert and oriented. No focal neurological deficits. Extremities: Symmetric 5 x 5 power. Skin: No rashes, lesions or ulcers Psychiatry: Judgement and insight appear normal. Mood & affect appropriate.     Data Reviewed: I have personally reviewed following labs and imaging studies  CBC:  Recent Labs Lab 09/11/16 1201  WBC 8.2  HGB 12.4  HCT 36.5  MCV 104.0*  PLT 123456   Basic Metabolic Panel:  Recent Labs Lab 09/11/16 1201 09/12/16 0451 09/13/16 0651  NA 141 140 140  K 2.9* 3.9 4.3  CL 105 105 107  CO2 29 28 26   GLUCOSE 180* 177* 102*  BUN 14 21* 23*  CREATININE 0.67 0.55 0.55  CALCIUM 9.3 8.5* 8.3*   GFR: Estimated Creatinine Clearance: 37.7 mL/min (by C-G formula based on SCr of 0.55 mg/dL). Liver Function Tests:  Recent Labs Lab 09/11/16 1201  AST 63*  ALT 56*  ALKPHOS 73  BILITOT 1.4*  PROT 6.4*  ALBUMIN 3.3*   No results for input(s): LIPASE, AMYLASE in the last 168 hours. No results for input(s): AMMONIA in the last 168 hours. Coagulation  Profile:  Recent Labs Lab 09/07/16 1319 09/11/16 1201 09/12/16 0451 09/13/16 0651  INR 1.9 1.71 1.81 2.20   Cardiac Enzymes:  Recent Labs Lab 09/11/16 1201 09/11/16 1801 09/11/16 2258 09/12/16 0451  TROPONINI <0.03 0.03* <0.03 <0.03   BNP (last 3 results) No results for input(s): PROBNP in the last 8760 hours. HbA1C: No results for input(s): HGBA1C in the last 72 hours. CBG:  Recent Labs Lab 09/11/16 1639 09/11/16 2142 09/12/16 1709 09/12/16 2048  GLUCAP 206* 243* 190* 125*   Lipid Profile: No results for input(s): CHOL, HDL, LDLCALC, TRIG, CHOLHDL, LDLDIRECT in the last 72 hours. Thyroid Function Tests:  Recent Labs  09/11/16 1801  TSH 2.222   Anemia Panel: No results for input(s): VITAMINB12, FOLATE, FERRITIN, TIBC, IRON, RETICCTPCT in the last 72 hours. Sepsis Labs: No results for input(s): PROCALCITON, LATICACIDVEN in  the last 168 hours.  No results found for this or any previous visit (from the past 240 hour(s)).       Radiology Studies: Dg Chest 2 View  Result Date: 09/11/2016 CLINICAL DATA:  Shortness of breath for 4 days worse this morning, history hypertension, type II diabetes mellitus, former smoker, chronic atrial fibrillation, COPD, collagen vascular disease EXAM: CHEST  2 VIEW COMPARISON:  07/26/2016 FINDINGS: Enlargement of cardiac silhouette. Atherosclerotic calcification aorta. Mediastinal contours and pulmonary vascularity normal. Bronchitic and chronic interstitial lung disease changes again noted, little changed versus previous exam. Underlying hyperinflation/emphysematous changes without consolidation or pneumothorax. Questionable small LEFT pleural effusion. Diffuse osseous demineralization with thoracolumbar scoliosis, multiple compression fractures, and prior spinal augmentation procedures. IMPRESSION: Enlargement of cardiac silhouette. COPD changes and chronic interstitial lung disease. Questionable tiny LEFT pleural effusion No  definite acute infiltrate. Electronically Signed   By: Lavonia Dana M.D.   On: 09/11/2016 12:19        Scheduled Meds: . aspirin  81 mg Oral Daily  . donepezil  5 mg Oral QHS  . feeding supplement (GLUCERNA SHAKE)  237 mL Oral BID BM  . furosemide  20 mg Intravenous Daily  . ipratropium-albuterol  3 mL Nebulization TID  . potassium chloride  40 mEq Oral TID  . predniSONE  40 mg Oral Q breakfast  . sertraline  100 mg Oral Daily  . warfarin  4 mg Oral Once  . Warfarin - Pharmacist Dosing Inpatient   Does not apply Q24H   Continuous Infusions:   LOS: 2 days    Time spent: 30 minutes    Loretha Stapler, MD [facility] Pager [phone]  If 7PM-7AM, please contact night-coverage [email or web address] Password [facility] 09/13/2016, 9:58 AM

## 2016-09-13 NOTE — Progress Notes (Signed)
Pt stated she felt like she could not breathe, [place] ox 98% on monitor; 02 still remains at 2L 02 per [place]. Pt stated she thinks its anxiety, Lorazepam 0.5mg  giver per PRN order. Will continue to monitor pt

## 2016-09-14 ENCOUNTER — Inpatient Hospital Stay (HOSPITAL_COMMUNITY): Payer: Medicare Other

## 2016-09-14 DIAGNOSIS — I5031 Acute diastolic (congestive) heart failure: Secondary | ICD-10-CM

## 2016-09-14 LAB — PROTIME-INR
INR: 2.3
Prothrombin Time: 25.7 seconds — ABNORMAL HIGH (ref 11.4–15.2)

## 2016-09-14 LAB — BASIC METABOLIC PANEL
Anion gap: 6 (ref 5–15)
BUN: 22 mg/dL — AB (ref 6–20)
CALCIUM: 8.5 mg/dL — AB (ref 8.9–10.3)
CO2: 27 mmol/L (ref 22–32)
CREATININE: 0.56 mg/dL (ref 0.44–1.00)
Chloride: 104 mmol/L (ref 101–111)
GFR calc Af Amer: >60 mL/min (ref >60)
GLUCOSE: 109 mg/dL — AB (ref 65–99)
Potassium: 4 mmol/L (ref 3.5–5.1)
Sodium: 137 mmol/L (ref 135–145)

## 2016-09-14 MED ORDER — WARFARIN SODIUM 2 MG PO TABS
4.0000 mg | ORAL_TABLET | Freq: Once | ORAL | Status: AC
  Administered 2016-09-14: 4 mg via ORAL
  Filled 2016-09-14: qty 2

## 2016-09-14 NOTE — Progress Notes (Signed)
SATURATION QUALIFICATIONS: (This note is used to comply with regulatory documentation for home oxygen)  Patient Saturations on Room Air at Rest = 94%  Patient Saturations on Room Air while Ambulating = 85%  Patient Saturations on 2 Liters of oxygen while Ambulating = 92%  

## 2016-09-14 NOTE — Evaluation (Signed)
Physical Therapy Evaluation Patient Details Name: [name] MRN: [id number] DOB: Jul 14, 1926 Today's Date: 09/14/2016   History of Present Illness  [name] is a 83 y.o. female with medical history significant of atrial fibrillation, CHF, HTN, hypothyroidism, DM Type II, GERD, and nonspecific lung disease presents for worsening shortness of breath for 1 week.  Patient reports that her ankles started swelling 1 week ago and that she became increasingly short of breath as the week went on.  She said she felt like she needed to come in a day earlier due to her shortness of breath.  She does not wear oxygen at home.  Patient reports swelling has increased from her feet, to her ankles to her legs.  She reports she has been able to lay flat to sleep- she sleeps on her side.  No fevers, chills, no cough, no chest pain or pain with breathing.  No nausea or vomiting.  No recent travel and no sick contacts.  Never had previous hospitalizations due to congestive heart failure.  Patient denies history of underlying pulmonary disease.  Clinical Impression  Pt received in bed and was agreeable to participate in Pt. Pt on 1.5-2.0 L of O2 throughout session. She was independent with bed mobility (supine > sit at EOB) and only CGA/supv for sit <> stand with RW. Pt ambulated 120 ft with RW and supv, with no LOB but she did ambulate at a decreased velocity.  Pt required increased time to complete tasks and she verbalized that she was not at her baseline with walking speed. Left pt sitting in chair with call bell, phone, and husband present at end of session. Pt would benefit from continued acute PT services and d/c recommendations include home with HHPT.     Follow Up Recommendations Home health PT    Equipment Recommendations  None recommended by PT    Recommendations for Other Services       Precautions / Restrictions Precautions Precautions: None Restrictions Weight Bearing Restrictions: No       Mobility  Bed Mobility Overal bed mobility: Independent             General bed mobility comments: supine > sit at EOB  Transfers Overall transfer level: Modified independent Equipment used: Rolling walker (2 wheeled)             General transfer comment: CGA/supv   Ambulation/Gait Ambulation/Gait assistance: Modified independent (Device/Increase time);Supervision Ambulation Distance (Feet): 120 Feet Assistive device: Rolling walker (2 wheeled) Gait Pattern/deviations: Step-through pattern;Decreased step length - right;Decreased step length - left;Decreased stride length Gait velocity: slowed, below baseline per pt subjective comments Gait velocity interpretation: <1.8 ft/sec, indicative of risk for recurrent falls    Stairs            Wheelchair Mobility    Modified Rankin (Stroke Patients Only)       Balance Overall balance assessment: Needs assistance Sitting-balance support: No upper extremity supported Sitting balance-Leahy Scale: Normal     Standing balance support: Bilateral upper extremity supported Standing balance-Leahy Scale: Fair                               Pertinent Vitals/Pain Pain Assessment: 0-10 Pain Score: 1  Pain Location: bil hips Pain Descriptors / Indicators: Aching    Home Living Family/patient expects to be discharged to:: Private residence Living Arrangements: Spouse/significant other   Type of Home: House Home Access: Level entry  Home Layout: One level Home Equipment: [place] - 4 wheels;Cane - single point;Shower seat      Prior Function        ADL's / Homemaking Assistance Needed: assistance for bathing and washing hair, but indepdndent for dressing.          Hand Dominance        Extremity/Trunk Assessment   Upper Extremity Assessment Upper Extremity Assessment: Overall WFL for tasks assessed    Lower Extremity Assessment Lower Extremity Assessment: Overall WFL for tasks  assessed       Communication      Cognition Arousal/Alertness: Awake/alert Behavior During Therapy: WFL for tasks assessed/performed Overall Cognitive Status: Within Functional Limits for tasks assessed                      General Comments      Exercises     Assessment/Plan    PT Assessment Patient needs continued PT services  PT Problem List Decreased strength;Decreased activity tolerance;Decreased mobility       PT Treatment Interventions DME instruction;Gait training;Stair training;Functional mobility training;Therapeutic activities;Therapeutic exercise;Balance training;Patient/family education    PT Goals (Current goals can be found in the Care Plan section)  Acute Rehab PT Goals Patient Stated Goal: to go home PT Goal Formulation: With patient Time For Goal Achievement: 09/18/16 Potential to Achieve Goals: Good    Frequency Min 3X/week   Barriers to discharge        Co-evaluation               End of Session Equipment Utilized During Treatment: Gait belt;Oxygen;Other (comment) (RW - 2 wheels) Activity Tolerance: Patient tolerated treatment well;Patient limited by fatigue Patient left: in chair;with call bell/phone within reach;with family/visitor present Nurse Communication: Mobility status PT Visit Diagnosis: Muscle weakness (generalized) (M62.81);Difficulty in walking, not elsewhere classified (R26.2)    Functional Assessment Tool Used: AM-PAC 6 Clicks Basic Mobility;Clinical judgement Functional Limitation: Mobility: Walking and moving around    Time: [id number] PT Time Calculation (min) (ACUTE ONLY): 26 min   Charges:   PT Evaluation $PT Eval Low Complexity: 1 Procedure PT Treatments $Gait Training: 8-22 mins   PT G Codes:   PT G-Codes **NOT FOR INPATIENT CLASS** Functional Assessment Tool Used: AM-PAC 6 Clicks Basic Mobility;Clinical judgement Functional Limitation: Mobility: Walking and moving around     [employer] 09/14/2016, 2:19 PM

## 2016-09-14 NOTE — Progress Notes (Signed)
[place] for Coumadin Indication: atrial fibrillation  Allergies  Allergen Reactions  . Propoxyphene N-Acetaminophen Shortness Of Breath  . Codeine Nausea And Vomiting  . Meperidine Hcl Nausea And Vomiting  . Morphine Nausea And Vomiting  . Shellfish Allergy Hives  . Tramadol Nausea Only   Patient Measurements: Height: 5\' 2"  (157.5 cm) Weight: 111 lb (50.3 kg) IBW/kg (Calculated) : 50.1  Vital Signs: Temp: 98.9 F (37.2 C) (02/26 0700) Temp Source: Oral (02/26 0700) BP: 154/77 (02/26 0700) Pulse Rate: 79 (02/26 0700)  Labs:  Recent Labs  09/11/16 1201 09/11/16 1801 09/11/16 2258 09/12/16 0451 09/13/16 0651 09/14/16 0635  HGB 12.4  --   --   --   --   --   HCT 36.5  --   --   --   --   --   PLT 304  --   --   --   --   --   APTT 38*  --   --   --   --   --   LABPROT 20.3*  --   --  21.2* 24.8* 25.7*  INR 1.71  --   --  1.81 2.20 2.30  CREATININE 0.67  --   --  0.55 0.55 0.56  TROPONINI <0.03 0.03* <0.03 <0.03  --   --    Estimated Creatinine Clearance: 37.7 mL/min (by C-G formula based on SCr of 0.56 mg/dL).  Medical History: Past Medical History:  Diagnosis Date  . Anemia   . Anxiety   . Aortic sclerosis   . Arthritis   . Chronic atrial fibrillation ([place])   . Chronic gastritis 10/2009   Duodenitis on EGD  . CKD (chronic kidney disease) stage 2, GFR 60-89 ml/min   . Collagen vascular disease ([place])   . COPD (chronic obstructive pulmonary disease) ([place])   . Diabetes type 2, controlled ([place])    Diet controlled  . Diverticulosis   . Dry ARMD    Retinal hemorrhage (09/2013) [place]  . Essential hypertension, benign   . GERD (gastroesophageal reflux disease)   . Glaucoma    Dr. Venetia Maxon  . Hiatal hernia 2011   Small  . History of pelvic fracture April 2013  . History of rheumatic fever   . History of skin cancer   . History of TIA (transient ischemic attack)   . Hypothyroidism   . Macular degeneration   . Mixed  hyperlipidemia   . Osteoporosis 04/2013   Thoracic compression fracture, on bisphosphonate and cal/vit D  . Pulmonary nodules    Stable bilateral on CT 01/2010, rec rpt yearly for 2 yrs, pt decided to stop f/u  . Seasonal allergies   . Vertebral compression fracture ([place]) 05/2016   Vertebroplasty L2/L3   Medications:  Prescriptions Prior to Admission  Medication Sig Dispense Refill Last Dose  . acetaminophen (TYLENOL) 500 MG tablet Take 2 tablets (1,000 mg total) by mouth 3 (three) times daily.   Past Week at Unknown time  . donepezil (ARICEPT) 5 MG tablet Take 1 tablet (5 mg total) by mouth at bedtime. 30 tablet 3 unknown  . felodipine (PLENDIL) 2.5 MG 24 hr tablet TAKE 1 TABLET DAILY 90 tablet 1 Past Week at Unknown time  . furosemide (LASIX) 20 MG tablet Take 2 tablets (40 mg total) by mouth daily as needed. 180 tablet 0 unknown  . levothyroxine (SYNTHROID, LEVOTHROID) 50 MCG tablet TAKE 1 TABLET DAILY 90 tablet 3 Past Week at Unknown time  .  Omega-3 Fatty Acids (FISH OIL CONCENTRATE) 1000 MG CAPS Take 2 capsules by mouth 2 (two) times daily.     Past Week at Unknown time  . calcitonin, salmon, (MIACALCIN/FORTICAL) 200 UNIT/ACT nasal spray Place 1 spray into alternate nostrils daily. (Patient not taking: Reported on 09/11/2016) 3.7 mL 12 Not Taking at Unknown time  . Calcium-Magnesium-Vitamin D [id number] MG-MG-UNIT TABS Take 1 tablet by mouth 2 (two) times daily.   Taking  . ferrous sulfate 325 (65 FE) MG tablet Take 325 mg by mouth daily with breakfast.   Taking  . Ipratropium-Albuterol (COMBIVENT RESPIMAT) 20-100 MCG/ACT AERS respimat Inhale 1 puff into the lungs every 6 (six) hours. 4 g 3 Taking  . latanoprost (XALATAN) 0.005 % ophthalmic solution Place 1 drop into both eyes at bedtime.    Taking  . lidocaine (LIDODERM) 5 % Place 1 patch onto the skin daily. Remove & Discard patch within 12 hours or as directed by MD 30 patch 6 Taking  . LORazepam (ATIVAN) 0.5 MG tablet Take 1 tablet  (0.5 mg total) by mouth every 8 (eight) hours as needed for anxiety. 60 tablet 0 Taking  . Misc Natural Products (FIBER 7) POWD Take by mouth daily.   Taking  . Multiple Vitamin (MULTIVITAMIN) capsule Take 1 capsule by mouth every morning.    Taking  . potassium chloride SA (K-DUR,KLOR-CON) 20 MEQ tablet Take 1 tablet (20 mEq total) by mouth daily as needed (with lasix). 30 tablet 1   . sertraline (ZOLOFT) 100 MG tablet Take 1 tablet (100 mg total) by mouth daily. 90 tablet 1 Taking  . vitamin B-12 (CYANOCOBALAMIN) 500 MCG tablet Take 500 mcg by mouth daily.   Taking  . warfarin (COUMADIN) 2.5 MG tablet Take 1 tablet daily except 1 1/2 tablets on Mondays and Thursdays (Patient taking differently: Take 3.75 mg by mouth every evening. ) 180 tablet 3 Taking   Assessment: 83yo female on chronic Coumadin PTA.  INR is therapeutic.  Goal of Therapy:  INR 2-3 Monitor platelets by anticoagulation protocol: Yes   Plan:  Coumadin 4mg  today x 1 INR daily  Terie Lear A 09/14/2016,10:53 AM

## 2016-09-14 NOTE — Progress Notes (Signed)
PROGRESS NOTE    [name]  [id number] DOB: 07/25/1925 DOA: 09/11/2016 PCP: Ria Bush, MD    Brief Narrative:  [name] is a 83 y.o. female with medical history significant of atrial fibrillation, CHF, HTN, hypothyroidism, DM Type II, GERD, and nonspecific lung disease presents for worsening shortness of breath for 1 week.  Patient reports that her ankles started swelling 1 week ago and that she became increasingly short of breath as the week went on.  She said she felt like she needed to come in a day earlier due to her shortness of breath.  She does not wear oxygen at home.  Patient reports swelling has increased from her feet, to her ankles to her legs.  She reports she has been able to lay flat to sleep- she sleeps on her side.  No fevers, chills, no cough, no chest pain or pain with breathing.  No nausea or vomiting.  No recent travel and no sick contacts.  Never had previous hospitalizations due to congestive heart failure.  Patient denies history of underlying pulmonary disease.   Assessment & Plan:   Principal Problem:   Acute CHF (congestive heart failure) (HCC) Active Problems:   Hyperlipidemia   Essential hypertension, benign   Persistent atrial fibrillation (HCC)   Hypothyroidism   Diet-controlled diabetes mellitus (HCC)   GERD (gastroesophageal reflux disease)   Chronic nonspecific lung disease (HCC)   Right heart failure, NYHA class 3   Hypokalemia   Right heart failure, NYHA class 3 - echocardiogram: Left ventricle: The cavity size was normal. Wall thickness was increased in a pattern of moderate LVH. Systolic function was normal. The estimated ejection fraction was in the range of 60% to 65%. Wall motion was normal; there were no regional wall motion abnormalities. The study was not technically sufficient to allow evaluation of LV diastolic dysfunction due to atrial fibrillation. Aortic valve: Moderately calcified annulus. Moderately thickened  leaflets. Uncertain number of leaflets, possible bicuspid aortic valve. Mean gradient (S): 2 mm Hg. Valve area (VTI): 2.72 cm^2. Valve area (Vmax): 2.86 cm^2. Mitral valve: Mildly calcified annulus. Mildly thickened leaflets. There was mild regurgitation. Left atrium: The atrium was severely dilated. Right ventricle: The cavity size was moderately dilated. Systolic function was moderately to severely reduced. Right atrium: The atrium was severely dilated. Tricuspid valve: There was moderate regurgitation. Pulmonary arteries: Systolic pressure was severely increased. PA peak pressure: 86 mm Hg (S). - given 40mg  IV lasix in ED - change IV furosemide to PO - monitor daily weights  - stable Cr - I's and O's (-1448ml balance yesterday) - oxygen therapy  - add incentive spirometry  Dyspnea - CT chest ordered to further evaluate dyspnea - will encourage IS  Debility - PT eval ordered  Atrial fibrillation - telemetry - pharmacy to dose coumadin (lovenox as INR not therapeutic)  GERD - GI cocktail ordered  Essential HTN - will restart Plendil as indicated (blood pressure currently controlled)  Hypokalemia - history of chronic hypokalemia - PO supplementation - decreased K supplementation to BID (from TID)  Hypothyroidism - restart levothyroxine - TSH of 2.22  Depression - continue zoloft  DVT prophylaxis: On coumadin Code Status:  DNR Family Communication:  Husband is bedside Disposition Plan: discharge when breathing status improved/ stabilized likely in 1-2 days   Consultants:   None  Procedures:   Echocardiogram 2/24  Antimicrobials:   None    Subjective: Patient states her breathing has not improved or worsened.  Still feels  short of breath with ambulation even to the restroom.  Objective: Vitals:   09/14/16 0700 09/14/16 0742 09/14/16 1258 09/14/16 1433  BP: (!) 154/77  132/60   Pulse: 79  84   Resp: 18  20   Temp: 98.9 F (37.2 C)  97.7 F (36.5  C)   TempSrc: Oral  Oral   SpO2: 99% 95% 95% 94%  Weight:      Height:        Intake/Output Summary (Last 24 hours) at 09/14/16 1615 Last data filed at 09/14/16 1259  Gross per 24 hour  Intake              600 ml  Output             2250 ml  Net            -1650 ml   Filed Weights   09/11/16 1145 09/13/16 1500 09/14/16 0600  Weight: 51.3 kg (113 lb) 50.7 kg (111 lb 11.2 oz) 50.3 kg (111 lb)    Examination:  General exam: Appears calm and comfortable  Respiratory system: Rales at bases, otherwise clear, slight increased work of breathing but no accessory muscle use, nasal cannula in place Cardiovascular system: S1 & S2 heard, RRR. No JVD, murmurs, rubs, gallops or clicks. No pedal edema. Gastrointestinal system: Abdomen is nondistended, soft and nontender. No organomegaly or masses felt. Normal bowel sounds heard. Central nervous system: Alert and oriented. No focal neurological deficits. Extremities: Symmetric 5 x 5 power. Skin: No rashes, lesions or ulcers Psychiatry: Judgement and insight appear normal. Mood & affect appropriate.     Data Reviewed: I have personally reviewed following labs and imaging studies  CBC:  Recent Labs Lab 09/11/16 1201  WBC 8.2  HGB 12.4  HCT 36.5  MCV 104.0*  PLT 123456   Basic Metabolic Panel:  Recent Labs Lab 09/11/16 1201 09/12/16 0451 09/13/16 0651 09/14/16 0635  NA 141 140 140 137  K 2.9* 3.9 4.3 4.0  CL 105 105 107 104  CO2 29 28 26 27   GLUCOSE 180* 177* 102* 109*  BUN 14 21* 23* 22*  CREATININE 0.67 0.55 0.55 0.56  CALCIUM 9.3 8.5* 8.3* 8.5*   GFR: Estimated Creatinine Clearance: 37.7 mL/min (by C-G formula based on SCr of 0.56 mg/dL). Liver Function Tests:  Recent Labs Lab 09/11/16 1201  AST 63*  ALT 56*  ALKPHOS 73  BILITOT 1.4*  PROT 6.4*  ALBUMIN 3.3*   No results for input(s): LIPASE, AMYLASE in the last 168 hours. No results for input(s): AMMONIA in the last 168 hours. Coagulation Profile:  Recent  Labs Lab 09/11/16 1201 09/12/16 0451 09/13/16 0651 09/14/16 0635  INR 1.71 1.81 2.20 2.30   Cardiac Enzymes:  Recent Labs Lab 09/11/16 1201 09/11/16 1801 09/11/16 2258 09/12/16 0451  TROPONINI <0.03 0.03* <0.03 <0.03   BNP (last 3 results) No results for input(s): PROBNP in the last 8760 hours. HbA1C: No results for input(s): HGBA1C in the last 72 hours. CBG:  Recent Labs Lab 09/11/16 1639 09/11/16 2142 09/12/16 1709 09/12/16 2048  GLUCAP 206* 243* 190* 125*   Lipid Profile: No results for input(s): CHOL, HDL, LDLCALC, TRIG, CHOLHDL, LDLDIRECT in the last 72 hours. Thyroid Function Tests:  Recent Labs  09/11/16 1801  TSH 2.222   Anemia Panel: No results for input(s): VITAMINB12, FOLATE, FERRITIN, TIBC, IRON, RETICCTPCT in the last 72 hours. Sepsis Labs: No results for input(s): PROCALCITON, LATICACIDVEN in the last 168 hours.  No results  found for this or any previous visit (from the past 240 hour(s)).       Radiology Studies: Ct Chest Wo Contrast  Result Date: 09/14/2016 CLINICAL DATA:  Shortness of breath for 1 week, chronic atrial fibrillation, cu EXAM: CT CHEST WITHOUT CONTRAST TECHNIQUE: Multidetector CT imaging of the chest was performed following the standard protocol without IV contrast. COMPARISON:  CT scan 07/12/2016 FINDINGS: Cardiovascular: Cardiomegaly again noted. Atherosclerotic calcifications of thoracic aorta and coronary arteries. No pericardial effusion. Mediastinum/Nodes: There is no mediastinal adenopathy. A precarinal lymph node Measures 7 mm short-axis not pathologic. No hilar adenopathy is noted on this unenhanced scan. Visualized esophagus is unremarkable. Lungs/Pleura: Images of the lung parenchyma shows bilateral lower lobe posterior atelectasis or infiltrate left greater than right. Bilateral small pleural effusion. Small amount of fluid is noted just anterior to right major fissure. Again noted bilateral emphysematous changes. No  pulmonary edema. 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 Upper Abdomen: The visualized upper abdomen shows no adrenal gland mass. Visualized unenhanced liver, pancreas and spleen is unremarkable. Musculoskeletal: Sagittal images of the spine shows no destructive bony lesions. Again noted diffuse osteopenia and degenerative changes thoracic spine. Stable mild compression deformity mid thoracic spine T7 and T8 vertebral body. Again noted prior vertebroplasty L2 and L3 level. Stable moderate compression deformity upper endplate of L1. There is stable mild compression deformity upper endplate of 624THL vertebral body. Stable mild compression deformity upper endplate of [id number] vertebral body. IMPRESSION: 1. There is small bilateral pleural effusion. Atelectasis noted in right lower lobe posteriorly. Atelectasis or infiltrate in left lower lobe posteriorly. Again noted bilateral emphysematous changes. No pulmonary edema. 2. No mediastinal hematoma or adenopathy.  Patent central airways. 3. Again noted atherosclerotic calcifications of thoracic aorta and coronary arteries. 4. Stable mild compression deformities mid thoracic spine. Diffuse osteopenia and mild degenerative changes thoracic spine. Stable mild compression deformity upper endplate of QA348G and 624THL vertebral body. Stable moderate compression deformity L1 vertebral body. Prior vertebroplasty L2 and L3 level Electronically Signed   By: Lahoma Crocker M.D.   On: 09/14/2016 13:48        Scheduled Meds: . aspirin  81 mg Oral Daily  . donepezil  5 mg Oral QHS  . feeding supplement (GLUCERNA SHAKE)  237 mL Oral BID BM  . furosemide  20 mg Intravenous Daily  . ipratropium-albuterol  3 mL Nebulization TID  . potassium chloride  40 mEq Oral BID  . predniSONE  40 mg Oral Q breakfast  . sertraline  100 mg Oral Daily  . Warfarin - Pharmacist Dosing  Inpatient   Does not apply Q24H   Continuous Infusions:   LOS: 3 days    Time spent: 25 minutes    Loretha Stapler, MD [facility] Pager [phone]  If 7PM-7AM, please contact night-coverage [email or web address] Password [facility] 09/14/2016, 4:15 PM

## 2016-09-14 NOTE — Clinical Social Work Note (Signed)
CSW received consult for possible SNF. PT evaluated pt and recommend home health. Pt's husband at bedside. CSW discussed recommendation and pt feels comfortable with plan to return home with home health. CSW signing off.  Benay Pike, [place]

## 2016-09-14 NOTE — Care Management Note (Signed)
Case Management Note  Patient Details  Name: [name] MRN: [id number] Date of Birth: May 09, 1926  Subjective/Objective:                  Pt admitted with CHF. She is from home, lives with her husband and is ind with adl's pta. She uses a RW with ambulation. Pt has PCP, her husband drives her to appointments, she can afford medications. Her neighbor assists management of her medications but pt is unsure if this will continue. She has met requirements for supplemental oxygen at DC. Pt has chosen [facility] from list of DME providers. Mandy, from [place], aware of referral and will obtain pt info from chart and have port tank delivered to pt room today. Pt will need HH nursing/PT and pt has chosen [employer] from list of [facility] providers. [employer] rep, aware of referral and will obtain pt info from chart. Pt aware HH has 48hrs to make first visit.   Action/Plan: Anticipate DC home in next 24 hrs with HH nursing/pt through [facility] and continuous supplemental oxygen through [place].  Expected Discharge Date:    09/15/2016              Expected Discharge Plan:  [place]  In-House Referral:  NA  Discharge planning Services  CM Consult  Post Acute Care Choice:  Durable Medical Equipment, Home Health Choice offered to:  Patient  DME Arranged:  Oxygen DME Agency:  [facility]  HH Arranged:  RN, PT [place] Agency:  [place]  Status of Service:  Completed, signed off  Sherald Barge, RN 09/14/2016, 2:09 PM

## 2016-09-14 NOTE — Care Management Important Message (Signed)
Important Message  Patient Details  Name: [name] MRN: [id number] Date of Birth: 05-30-1926   Medicare Important Message Given:  Yes    Sherald Barge, RN 09/14/2016, 2:15 PM

## 2016-09-15 LAB — BASIC METABOLIC PANEL
Anion gap: 5 (ref 5–15)
BUN: 27 mg/dL — AB (ref 6–20)
CALCIUM: 8.6 mg/dL — AB (ref 8.9–10.3)
CHLORIDE: 106 mmol/L (ref 101–111)
CO2: 28 mmol/L (ref 22–32)
CREATININE: 0.63 mg/dL (ref 0.44–1.00)
GFR calc non Af Amer: >60 mL/min (ref >60)
Glucose, Bld: 97 mg/dL (ref 65–99)
Potassium: 4 mmol/L (ref 3.5–5.1)
SODIUM: 139 mmol/L (ref 135–145)

## 2016-09-15 LAB — PROTIME-INR
INR: 2.37
PROTHROMBIN TIME: 26.4 s — AB (ref 11.4–15.2)

## 2016-09-15 MED ORDER — GLUCERNA SHAKE PO LIQD: 237.0000 mL | Freq: Two times a day (BID) | ORAL | 0 refills | Status: AC

## 2016-09-15 MED ORDER — WARFARIN SODIUM 2 MG PO TABS: 4.0000 mg | ORAL_TABLET | Freq: Once | ORAL | Status: DC

## 2016-09-15 MED ORDER — PREDNISONE 20 MG PO TABS: 40.0000 mg | ORAL_TABLET | Freq: Every day | ORAL | 0 refills | Status: AC

## 2016-09-15 NOTE — Care Management (Signed)
Port O2 tank from [place] has been delivered to room. [employer] rep, aware of DC today.

## 2016-09-15 NOTE — Consult Note (Signed)
   [facility] CM Inpatient Consult   09/15/2016  KALINA MORABITO March 23, 1926 [id number]  Chart review revealed patient eligible for [place] Management services and post hospital discharge follow up related to a diagnosis of HF. Patient was evaluated for community based chronic disease management services with [facility] care Management Program as a benefit of patient's [employer] Medicare. Met with the patient and her spouse at the bedside to explain [place] Management services. Patient endorses her primary care provider to be Dr. Ria Bush. Patient states she will be receiving [facility] [place] services. Verbal permission given for [facility] services. Patient gave [phone] as the best number to reach her. Patient will receive post hospital discharge calls and be evaluated for monthly home visits. [facility] Care Management services does not interfere with or replace any services arranged by the inpatient care management team. RNCM left contact information and [facility] literature at the bedside. Made inpatient RNCM aware that [facility] will be following for care management. For additional questions please contact:   Donato Studley RN, [facility] Liaison  [phone]) Business Mobile [phone]) Toll free office

## 2016-09-15 NOTE — Discharge Summary (Signed)
Physician Discharge Summary  HANAA FORESTER [id number] DOB: 1926/01/03 DOA: 09/11/2016  PCP: Ria Bush, MD  Admit date: 09/11/2016 Discharge date: 09/15/2016  Admitted From: Home Disposition:  Home   Recommendations for Outpatient Follow-up:  1. Follow up with PCP in 1-2 weeks 2. Discuss referral for pulmonary function tests 3. Use incentive spirometer frequently throughout the day 4. Please obtain BMP/CBC in one week 5. Please follow up on the following pending results:  Home Health: Yes- PT/ RN  Equipment/Devices: Oxygen 2L while oxygenating  Discharge Condition: Stable CODE STATUS: DNR Diet recommendation: Heart Healthy   Brief/Interim Summary: Valerie Uhles Montgomeryis a 83 y.o.femalewith medical history significant of atrial fibrillation, CHF, HTN, hypothyroidism, DM Type II, GERD, and nonspecific lung disease presents for worsening shortness of breath for 1 week. Patient reports that her ankles started swelling 1 week ago and that she became increasingly short of breath as the week went on. She said she felt like she needed to come in a day earlier due to her shortness of breath. She does not wear oxygen at home. Patient reports swelling has increased from her feet, to her ankles to her legs. She reports she has been able to lay flat to sleep- she sleeps on her side. No fevers, chills, no cough, no chest pain or pain with breathing. No nausea or vomiting. No recent travel and no sick contacts. Never had previous hospitalizations due to congestive heart failure. Patient denies history of underlying pulmonary disease. Over hospitalization patient was seen by PT and found to be appropriate for home health PT.  She underwent CT scan on 09/14/16 which showed There is small bilateral pleural effusion. Atelectasis noted in right lower lobe posteriorly. Atelectasis or infiltrate in left lower lobe posteriorly. Again noted bilateral emphysematous changes. No pulmonary edema.   She was instructed to use incentive spirometer frequently throughout the day.  She was also encouraged to follow up with her PCP and discuss referral for pulmonary function tests.  She, her son and her husband voice understanding.  Patient was set up with home oxygen and discharged on 09/15/16 in stable condition.  Discharge Diagnoses:  Principal Problem:   Acute CHF (congestive heart failure) (HCC) Active Problems:   Hyperlipidemia   Essential hypertension, benign   Persistent atrial fibrillation (HCC)   Hypothyroidism   Diet-controlled diabetes mellitus (HCC)   GERD (gastroesophageal reflux disease)   Chronic nonspecific lung disease (HCC)   Right heart failure, NYHA class 3   Hypokalemia    Discharge Instructions  Discharge Instructions    Activity as tolerated - No restrictions    Complete by:  As directed    Call MD for:  difficulty breathing, headache or visual disturbances    Complete by:  As directed    Call MD for:  extreme fatigue    Complete by:  As directed    Call MD for:  hives    Complete by:  As directed    Call MD for:  persistant dizziness or light-headedness    Complete by:  As directed    Call MD for:  persistant nausea and vomiting    Complete by:  As directed    Call MD for:  severe uncontrolled pain    Complete by:  As directed    Call MD for:  temperature >100.4    Complete by:  As directed    Diet - low sodium heart healthy    Complete by:  As directed    Discharge instructions  Complete by:  As directed    Wear oxygen as instructed Take medications as prescribed Follow up with your PCP within 1-2 weeks and talk about getting PFT's   Increase activity slowly    Complete by:  As directed      Allergies as of 09/15/2016      Reactions   Propoxyphene N-acetaminophen Shortness Of Breath   Codeine Nausea And Vomiting   Meperidine Hcl Nausea And Vomiting   Morphine Nausea And Vomiting   Shellfish Allergy Hives   Tramadol Nausea Only       Medication List    STOP taking these medications   calcitonin (salmon) 200 UNIT/ACT nasal spray Commonly known as:  MIACALCIN/FORTICAL     TAKE these medications   acetaminophen 500 MG tablet Commonly known as:  TYLENOL Take 2 tablets (1,000 mg total) by mouth 3 (three) times daily.   Calcium-Magnesium-Vitamin D [id number] MG-MG-UNIT Tabs Take 1 tablet by mouth 2 (two) times daily.   donepezil 5 MG tablet Commonly known as:  ARICEPT Take 1 tablet (5 mg total) by mouth at bedtime.   feeding supplement (GLUCERNA SHAKE) Liqd Take 237 mLs by mouth 2 (two) times daily between meals.   felodipine 2.5 MG 24 hr tablet Commonly known as:  PLENDIL TAKE 1 TABLET DAILY   ferrous sulfate 325 (65 FE) MG tablet Take 325 mg by mouth daily with breakfast.   FIBER 7 Powd Take by mouth daily.   FISH OIL CONCENTRATE 1000 MG Caps Take 2 capsules by mouth 2 (two) times daily.   furosemide 20 MG tablet Commonly known as:  LASIX Take 2 tablets (40 mg total) by mouth daily as needed.   Ipratropium-Albuterol 20-100 MCG/ACT Aers respimat Commonly known as:  COMBIVENT RESPIMAT Inhale 1 puff into the lungs every 6 (six) hours.   latanoprost 0.005 % ophthalmic solution Commonly known as:  XALATAN Place 1 drop into both eyes at bedtime.   levothyroxine 50 MCG tablet Commonly known as:  SYNTHROID, LEVOTHROID TAKE 1 TABLET DAILY   lidocaine 5 % Commonly known as:  LIDODERM Place 1 patch onto the skin daily. Remove & Discard patch within 12 hours or as directed by MD   LORazepam 0.5 MG tablet Commonly known as:  ATIVAN Take 1 tablet (0.5 mg total) by mouth every 8 (eight) hours as needed for anxiety.   multivitamin capsule Take 1 capsule by mouth every morning.   potassium chloride SA 20 MEQ tablet Commonly known as:  K-DUR,KLOR-CON Take 1 tablet (20 mEq total) by mouth daily as needed (with lasix).   predniSONE 20 MG tablet Commonly known as:  DELTASONE Take 2 tablets (40 mg  total) by mouth daily with breakfast. Start taking on:  09/16/2016   sertraline 100 MG tablet Commonly known as:  ZOLOFT Take 1 tablet (100 mg total) by mouth daily.   vitamin B-12 500 MCG tablet Commonly known as:  CYANOCOBALAMIN Take 500 mcg by mouth daily.   warfarin 2.5 MG tablet Commonly known as:  COUMADIN Take 1 tablet daily except 1 1/2 tablets on Mondays and Thursdays What changed:  how much to take  how to take this  when to take this  additional instructions            Durable Medical Equipment        Start     Ordered   09/14/16 1154  For home use only DME oxygen  Once    Question Answer Comment  Mode or (Route) Nasal  cannula   Liters per Minute 2   Frequency Continuous (stationary and portable oxygen unit needed)   Oxygen conserving device Yes   Oxygen delivery system Gas      09/14/16 1153     Follow-up Information    [facility] Follow up.   Specialty:  Home Health Services Contact information: [place] [address] [phone]        Ria Bush, MD. Schedule an appointment as soon as possible for a visit in 1 week(s).   Specialty:  Family Medicine Contact information: [place] [address] [phone]        Carlyle Dolly, MD. Schedule an appointment as soon as possible for a visit in 2 month(s).   Specialty:  Cardiology Contact information: [place] [address] [phone]          Allergies  Allergen Reactions  . Propoxyphene N-Acetaminophen Shortness Of Breath  . Codeine Nausea And Vomiting  . Meperidine Hcl Nausea And Vomiting  . Morphine Nausea And Vomiting  . Shellfish Allergy Hives  . Tramadol Nausea Only    Consultations:  PT  CM  Procedures/Studies: Dg Chest 2 View  Result Date: 09/11/2016 CLINICAL DATA:  Shortness of breath for 4 days worse this morning, history hypertension, type II diabetes mellitus, former  smoker, chronic atrial fibrillation, COPD, collagen vascular disease EXAM: CHEST  2 VIEW COMPARISON:  07/26/2016 FINDINGS: Enlargement of cardiac silhouette. Atherosclerotic calcification aorta. Mediastinal contours and pulmonary vascularity normal. Bronchitic and chronic interstitial lung disease changes again noted, little changed versus previous exam. Underlying hyperinflation/emphysematous changes without consolidation or pneumothorax. Questionable small LEFT pleural effusion. Diffuse osseous demineralization with thoracolumbar scoliosis, multiple compression fractures, and prior spinal augmentation procedures. IMPRESSION: Enlargement of cardiac silhouette. COPD changes and chronic interstitial lung disease. Questionable tiny LEFT pleural effusion No definite acute infiltrate. Electronically Signed   By: Lavonia Dana M.D.   On: 09/11/2016 12:19   Ct Chest Wo Contrast  Result Date: 09/14/2016 CLINICAL DATA:  Shortness of breath for 1 week, chronic atrial fibrillation, cu EXAM: CT CHEST WITHOUT CONTRAST TECHNIQUE: Multidetector CT imaging of the chest was performed following the standard protocol without IV contrast. COMPARISON:  CT scan 07/12/2016 FINDINGS: Cardiovascular: Cardiomegaly again noted. Atherosclerotic calcifications of thoracic aorta and coronary arteries. No pericardial effusion. Mediastinum/Nodes: There is no mediastinal adenopathy. A precarinal lymph node Measures 7 mm short-axis not pathologic. No hilar adenopathy is noted on this unenhanced scan. Visualized esophagus is unremarkable. Lungs/Pleura: Images of the lung parenchyma shows bilateral lower lobe posterior atelectasis or infiltrate left greater than right. Bilateral small pleural effusion. Small amount of fluid is noted just anterior to right major fissure. Again noted bilateral emphysematous changes. No pulmonary edema. A pleural based low-density nodule in left lower lobe axial image 108 measures 10 mm stable in size in appearance  from prior exam probable a pleural based lipoma. Upper Abdomen: The visualized upper abdomen shows no adrenal gland mass. Visualized unenhanced liver, pancreas and spleen is unremarkable. Musculoskeletal: Sagittal images of the spine shows no destructive bony lesions. Again noted diffuse osteopenia and degenerative changes thoracic spine. Stable mild compression deformity mid thoracic spine T7 and T8 vertebral body. Again noted prior vertebroplasty L2 and L3 level. Stable moderate compression deformity upper endplate of L1. There is stable mild compression deformity upper endplate of 624THL vertebral body. Stable mild compression deformity upper endplate of QA348G vertebral body. IMPRESSION: 1. There is small bilateral pleural  effusion. Atelectasis noted in right lower lobe posteriorly. Atelectasis or infiltrate in left lower lobe posteriorly. Again noted bilateral emphysematous changes. No pulmonary edema. 2. No mediastinal hematoma or adenopathy.  Patent central airways. 3. Again noted atherosclerotic calcifications of thoracic aorta and coronary arteries. 4. Stable mild compression deformities mid thoracic spine. Diffuse osteopenia and mild degenerative changes thoracic spine. Stable mild compression deformity upper endplate of QA348G and 624THL vertebral body. Stable moderate compression deformity L1 vertebral body. Prior vertebroplasty L2 and L3 level Electronically Signed   By: Lahoma Crocker M.D.   On: 09/14/2016 13:48    Echocardiogram: Left ventricle: The cavity size was normal. Wall thickness was   increased in a pattern of moderate LVH. Systolic function was   normal. The estimated ejection fraction was in the range of 60%   to 65%. Wall motion was normal; there were no regional wall   motion abnormalities. The study was not technically sufficient to   allow evaluation of LV diastolic dysfunction due to atrial   fibrillation. - Aortic valve: Moderately calcified annulus. Moderately thickened   leaflets.  Uncertain number of leaflets, possible bicuspid aortic   valve. Mean gradient (S): 2 mm Hg. Valve area (VTI): 2.72 cm^2.   Valve area (Vmax): 2.86 cm^2. - Mitral valve: Mildly calcified annulus. Mildly thickened leaflets   . There was mild regurgitation. - Left atrium: The atrium was severely dilated. - Right ventricle: The cavity size was moderately dilated. Systolic   function was moderately to severely reduced. - Right atrium: The atrium was severely dilated. - Tricuspid valve: There was moderate regurgitation. - Pulmonary arteries: Systolic pressure was severely increased. PA   peak pressure: 86 mm Hg (S). - Technically adequate study.   Subjective: Patient, son and husband are in the room.  Patient reports she feels as though she can manage at home.  She asks many questions about follow up and what to discuss with her PCP.  She was set up with home oxygen.   Discharge Exam: Vitals:   09/14/16 2116 09/15/16 0438  BP: 139/86 (!) 142/84  Pulse: 95 83  Resp: 20 20  Temp: 97.4 F (36.3 C) 97.9 F (36.6 C)   Vitals:   09/14/16 1914 09/14/16 2116 09/15/16 0438 09/15/16 0843  BP:  139/86 (!) 142/84   Pulse:  95 83   Resp:  20 20   Temp:  97.4 F (36.3 C) 97.9 F (36.6 C)   TempSrc:  Oral Oral   SpO2: 93% 98% 95% 96%  Weight:   49 kg (108 lb 1.6 oz)   Height:        General: Pt is alert, awake, not in acute distress, thin, elderly, chronically ill appearing female Cardiovascular: RRR, S1/S2 +, no rubs, no gallops Respiratory: few bibasilar rales, good air movement, no increased respiratory effort Abdominal: Soft, NT, ND, bowel sounds + Extremities: no edema, no cyanosis    The results of significant diagnostics from this hospitalization (including imaging, microbiology, ancillary and laboratory) are listed below for reference.     Microbiology: No results found for this or any previous visit (from the past 240 hour(s)).   Labs: BNP (last 3 results)  Recent  Labs  07/12/16 1628 07/26/16 2211 09/11/16 1201  BNP 385.0* 364.0* [id number]*   Basic Metabolic Panel:  Recent Labs Lab 09/11/16 1201 09/12/16 0451 09/13/16 0651 09/14/16 0635 09/15/16 0610  NA 141 140 140 137 139  K 2.9* 3.9 4.3 4.0 4.0  CL 105 105 107 104  106  CO2 29 28 26 27 28   GLUCOSE 180* 177* 102* 109* 97  BUN 14 21* 23* 22* 27*  CREATININE 0.67 0.55 0.55 0.56 0.63  CALCIUM 9.3 8.5* 8.3* 8.5* 8.6*   Liver Function Tests:  Recent Labs Lab 09/11/16 1201  AST 63*  ALT 56*  ALKPHOS 73  BILITOT 1.4*  PROT 6.4*  ALBUMIN 3.3*   No results for input(s): LIPASE, AMYLASE in the last 168 hours. No results for input(s): AMMONIA in the last 168 hours. CBC:  Recent Labs Lab 09/11/16 1201  WBC 8.2  HGB 12.4  HCT 36.5  MCV 104.0*  PLT 304   Cardiac Enzymes:  Recent Labs Lab 09/11/16 1201 09/11/16 1801 09/11/16 2258 09/12/16 0451  TROPONINI <0.03 0.03* <0.03 <0.03   BNP: Invalid input(s): POCBNP CBG:  Recent Labs Lab 09/11/16 1639 09/11/16 2142 09/12/16 1709 09/12/16 2048  GLUCAP 206* 243* 190* 125*   D-Dimer No results for input(s): DDIMER in the last 72 hours. Hgb A1c No results for input(s): HGBA1C in the last 72 hours. Lipid Profile No results for input(s): CHOL, HDL, LDLCALC, TRIG, CHOLHDL, LDLDIRECT in the last 72 hours. Thyroid function studies No results for input(s): TSH, T4TOTAL, T3FREE, THYROIDAB in the last 72 hours.  Invalid input(s): FREET3 Anemia work up No results for input(s): VITAMINB12, FOLATE, FERRITIN, TIBC, IRON, RETICCTPCT in the last 72 hours. Urinalysis    Component Value Date/Time   COLORURINE YELLOW 07/26/2016 2219   APPEARANCEUR HAZY (A) 07/26/2016 2219   LABSPEC 1.004 (L) 07/26/2016 2219   PHURINE 8.0 07/26/2016 2219   GLUCOSEU NEGATIVE 07/26/2016 2219   HGBUR NEGATIVE 07/26/2016 2219   [place] NEGATIVE 07/26/2016 2219   KETONESUR NEGATIVE 07/26/2016 2219   PROTEINUR NEGATIVE 07/26/2016 2219    UROBILINOGEN 0.2 12/25/2013 2250   NITRITE NEGATIVE 07/26/2016 2219   LEUKOCYTESUR NEGATIVE 07/26/2016 2219   Sepsis Labs Invalid input(s): PROCALCITONIN,  WBC,  LACTICIDVEN Microbiology No results found for this or any previous visit (from the past 240 hour(s)).   Time coordinating discharge: 40 minutes  SIGNED:   Loretha Stapler, MD  [facility] 09/15/2016, 11:30 AM Pager [phone] If 7PM-7AM, please contact night-coverage [email or web address] Password [email or web address]

## 2016-09-15 NOTE — Progress Notes (Signed)
[facility] for Coumadin Indication: atrial fibrillation  Allergies  Allergen Reactions  . Propoxyphene N-Acetaminophen Shortness Of Breath  . Codeine Nausea And Vomiting  . Meperidine Hcl Nausea And Vomiting  . Morphine Nausea And Vomiting  . Shellfish Allergy Hives  . Tramadol Nausea Only   Patient Measurements: Height: 5\' 2"  (157.5 cm) Weight: 108 lb 1.6 oz (49 kg) IBW/kg (Calculated) : 50.1  Vital Signs: Temp: 97.9 F (36.6 C) (02/27 0438) Temp Source: Oral (02/27 0438) BP: 142/84 (02/27 0438) Pulse Rate: 83 (02/27 0438)  Labs:  Recent Labs  09/13/16 0651 09/14/16 0635 09/15/16 0610  LABPROT 24.8* 25.7* 26.4*  INR 2.20 2.30 2.37  CREATININE 0.55 0.56 0.63   Estimated Creatinine Clearance: 36.9 mL/min (by C-G formula based on SCr of 0.63 mg/dL).  Medical History: Past Medical History:  Diagnosis Date  . Anemia   . Anxiety   . Aortic sclerosis   . Arthritis   . Chronic atrial fibrillation ([place])   . Chronic gastritis 10/2009   Duodenitis on EGD  . CKD (chronic kidney disease) stage 2, GFR 60-89 ml/min   . Collagen vascular disease ([place])   . COPD (chronic obstructive pulmonary disease) ([place])   . Diabetes type 2, controlled ([place])    Diet controlled  . Diverticulosis   . Dry ARMD    Retinal hemorrhage (09/2013) [place]  . Essential hypertension, benign   . GERD (gastroesophageal reflux disease)   . Glaucoma    Dr. Venetia Maxon  . Hiatal hernia 2011   Small  . History of pelvic fracture April 2013  . History of rheumatic fever   . History of skin cancer   . History of TIA (transient ischemic attack)   . Hypothyroidism   . Macular degeneration   . Mixed hyperlipidemia   . Osteoporosis 04/2013   Thoracic compression fracture, on bisphosphonate and cal/vit D  . Pulmonary nodules    Stable bilateral on CT 01/2010, rec rpt yearly for 2 yrs, pt decided to stop f/u  . Seasonal allergies   . Vertebral compression fracture  ([place]) 05/2016   Vertebroplasty L2/L3   Medications:  Prescriptions Prior to Admission  Medication Sig Dispense Refill Last Dose  . acetaminophen (TYLENOL) 500 MG tablet Take 2 tablets (1,000 mg total) by mouth 3 (three) times daily.   Past Week at Unknown time  . donepezil (ARICEPT) 5 MG tablet Take 1 tablet (5 mg total) by mouth at bedtime. 30 tablet 3 unknown  . felodipine (PLENDIL) 2.5 MG 24 hr tablet TAKE 1 TABLET DAILY 90 tablet 1 Past Week at Unknown time  . furosemide (LASIX) 20 MG tablet Take 2 tablets (40 mg total) by mouth daily as needed. 180 tablet 0 unknown  . levothyroxine (SYNTHROID, LEVOTHROID) 50 MCG tablet TAKE 1 TABLET DAILY 90 tablet 3 Past Week at Unknown time  . Omega-3 Fatty Acids (FISH OIL CONCENTRATE) 1000 MG CAPS Take 2 capsules by mouth 2 (two) times daily.     Past Week at Unknown time  . sertraline (ZOLOFT) 100 MG tablet Take 1 tablet (100 mg total) by mouth daily. 90 tablet 1 unknown  . calcitonin, salmon, (MIACALCIN/FORTICAL) 200 UNIT/ACT nasal spray Place 1 spray into alternate nostrils daily. (Patient not taking: Reported on 09/11/2016) 3.7 mL 12 Not Taking at Unknown time  . Calcium-Magnesium-Vitamin D [id number] MG-MG-UNIT TABS Take 1 tablet by mouth 2 (two) times daily.   unknown  . ferrous sulfate 325 (65 FE) MG tablet Take 325  mg by mouth daily with breakfast.   unknown  . Ipratropium-Albuterol (COMBIVENT RESPIMAT) 20-100 MCG/ACT AERS respimat Inhale 1 puff into the lungs every 6 (six) hours. 4 g 3 unknown  . latanoprost (XALATAN) 0.005 % ophthalmic solution Place 1 drop into both eyes at bedtime.    unknown  . lidocaine (LIDODERM) 5 % Place 1 patch onto the skin daily. Remove & Discard patch within 12 hours or as directed by MD 30 patch 6 unknown  . LORazepam (ATIVAN) 0.5 MG tablet Take 1 tablet (0.5 mg total) by mouth every 8 (eight) hours as needed for anxiety. 60 tablet 0 unknown  . Misc Natural Products (FIBER 7) POWD Take by mouth daily.   unknown  .  Multiple Vitamin (MULTIVITAMIN) capsule Take 1 capsule by mouth every morning.    unknown  . potassium chloride SA (K-DUR,KLOR-CON) 20 MEQ tablet Take 1 tablet (20 mEq total) by mouth daily as needed (with lasix). 30 tablet 1 unknown  . vitamin B-12 (CYANOCOBALAMIN) 500 MCG tablet Take 500 mcg by mouth daily.   unknown  . warfarin (COUMADIN) 2.5 MG tablet Take 1 tablet daily except 1 1/2 tablets on Mondays and Thursdays (Patient taking differently: Take 3.75 mg by mouth every evening. ) 180 tablet 3 unknown   Assessment: 83yo female on chronic Coumadin PTA.  INR is therapeutic.  Goal of Therapy:  INR 2-3    Plan:  Repeat Coumadin 4mg  today x 1 INR daily  Pricilla Larsson 09/15/2016,11:40 AM

## 2016-09-15 NOTE — Progress Notes (Signed)
Patient discharged with husband and son via wheelchair. O2 tank from [place] with patient and instructions on how to use shared with husband. Discharge instructions reviewed and patient and family acknowledge understanding.

## 2016-09-16 ENCOUNTER — Telehealth: Payer: Self-pay | Admitting: *Deleted

## 2016-09-16 ENCOUNTER — Telehealth: Payer: Self-pay

## 2016-09-16 DIAGNOSIS — I509 Heart failure, unspecified: Secondary | ICD-10-CM | POA: Diagnosis not present

## 2016-09-16 DIAGNOSIS — M150 Primary generalized (osteo)arthritis: Secondary | ICD-10-CM | POA: Diagnosis not present

## 2016-09-16 DIAGNOSIS — Z8673 Personal history of transient ischemic attack (TIA), and cerebral infarction without residual deficits: Secondary | ICD-10-CM

## 2016-09-16 DIAGNOSIS — F419 Anxiety disorder, unspecified: Secondary | ICD-10-CM | POA: Diagnosis not present

## 2016-09-16 DIAGNOSIS — Z7901 Long term (current) use of anticoagulants: Secondary | ICD-10-CM

## 2016-09-16 DIAGNOSIS — D649 Anemia, unspecified: Secondary | ICD-10-CM

## 2016-09-16 DIAGNOSIS — I482 Chronic atrial fibrillation: Secondary | ICD-10-CM | POA: Diagnosis not present

## 2016-09-16 DIAGNOSIS — Z7952 Long term (current) use of systemic steroids: Secondary | ICD-10-CM

## 2016-09-16 DIAGNOSIS — E1122 Type 2 diabetes mellitus with diabetic chronic kidney disease: Secondary | ICD-10-CM | POA: Diagnosis not present

## 2016-09-16 DIAGNOSIS — E039 Hypothyroidism, unspecified: Secondary | ICD-10-CM | POA: Diagnosis not present

## 2016-09-16 DIAGNOSIS — J449 Chronic obstructive pulmonary disease, unspecified: Secondary | ICD-10-CM | POA: Diagnosis not present

## 2016-09-16 DIAGNOSIS — N182 Chronic kidney disease, stage 2 (mild): Secondary | ICD-10-CM | POA: Diagnosis not present

## 2016-09-16 DIAGNOSIS — I130 Hypertensive heart and chronic kidney disease with heart failure and stage 1 through stage 4 chronic kidney disease, or unspecified chronic kidney disease: Secondary | ICD-10-CM | POA: Diagnosis not present

## 2016-09-16 DIAGNOSIS — M810 Age-related osteoporosis without current pathological fracture: Secondary | ICD-10-CM

## 2016-09-16 NOTE — Telephone Encounter (Signed)
[name] [id number] DOB: Mar 04, 1926 DOA: 09/11/2016  PCP: Ria Bush, MD  Admit date: 09/11/2016 Discharge date: 09/15/2016  Admitted From: Home Disposition:  Home   Recommendations for Outpatient Follow-up:  1. Follow up with PCP in 1-2 weeks 2. Discuss referral for pulmonary function tests 3. Use incentive spirometer frequently throughout the day 4. Please obtain BMP/CBC in one week 5. Please follow up on the following pending results:  Home Health: Yes- PT/ RN  Equipment/Devices: Oxygen 2L while oxygenating  Discharge Condition: Stable CODE STATUS: DNR Diet recommendation: Heart Healthy   Transition Care Management Follow-up Telephone Call   How have you been since you were released from the hospital? "Fine. I'm getting good care"   Do you understand why you were in the hospital? yes   Do you understand the discharge instructions? yes, care giver in background explaining to pt.   Where were you discharged to? Home with caregiver and husband and [facility] 1x week.   Items Reviewed:  Medications reviewed: yes  Allergies reviewed: yes  Dietary changes reviewed: no  Referrals reviewed: no   Functional Questionnaire:   Activities of Daily Living (ADLs):   She states they are independent in the following: ambulation, feeding, continence and toileting States they require assistance with the following: bathing and hygiene, grooming, dressing and Caregiver help daily   Any transportation issues/concerns?: no   Any patient concerns? no   Confirmed importance and date/time of follow-up visits scheduled yes  Provider Appointment booked with Dr.Duncan 09/22/16 @1215   Confirmed with patient if condition begins to worsen call PCP or go to the ER.  Patient was given the office number and encouraged to call back with question or concerns.  : yes

## 2016-09-16 NOTE — Telephone Encounter (Signed)
Will do. Thanks.

## 2016-09-16 NOTE — Telephone Encounter (Signed)
[name] with [facility] HH left v/m; pt was admitted to [facility] services today and wanting to verify Dr Darnell Level will sign orders for [facility] nursing, [place] aide and [place] request cb.

## 2016-09-17 DIAGNOSIS — I130 Hypertensive heart and chronic kidney disease with heart failure and stage 1 through stage 4 chronic kidney disease, or unspecified chronic kidney disease: Secondary | ICD-10-CM | POA: Diagnosis not present

## 2016-09-17 DIAGNOSIS — I509 Heart failure, unspecified: Secondary | ICD-10-CM | POA: Diagnosis not present

## 2016-09-17 NOTE — Telephone Encounter (Signed)
Routed to PCP as FYI.

## 2016-09-17 NOTE — Telephone Encounter (Signed)
Message left advising [name].

## 2016-09-18 ENCOUNTER — Other Ambulatory Visit: Payer: Self-pay | Admitting: *Deleted

## 2016-09-18 DIAGNOSIS — I509 Heart failure, unspecified: Secondary | ICD-10-CM | POA: Diagnosis not present

## 2016-09-18 DIAGNOSIS — I130 Hypertensive heart and chronic kidney disease with heart failure and stage 1 through stage 4 chronic kidney disease, or unspecified chronic kidney disease: Secondary | ICD-10-CM | POA: Diagnosis not present

## 2016-09-18 NOTE — Patient Outreach (Signed)
[facility]) Care Management  09/18/2016  [name] [birth year] [id number]    Valerie Smithee Montgomeryis a 83 y.o.femalewith medical history significant of atrial fibrillation, CHF, HTN, hypothyroidism, DM Type II, GERD, and nonspecific lung disease with worsening shortness of breath for 1 week prior to her inpatient  Admission on 09/11/16.  She had not been wearing oxygen at home.  CT scan on 09/14/16 which showed a small bilateral pleural effusion, atelectasis noted in right lower lobe posteriorly, atelectasis or infiltrate in left lower lobe posteriorly and bilateral emphysematous changes.  [name] discharging diagnosis was Acute CHF.  Patient was set up with home oxygen at 2 L  and discharged on 09/15/16 in stable condition home with [place] home health services.  [name] was instructed to follow up with her pcp, Ria Bush, in 1 week and her CV, Carlyle Dolly, in 2 months.  Prior to admission [name] lived at her home with her husband and has the support of her son also.    [name] was evaluated and referred by [facility] hospital liaison for community based chronic disease management services with [facility] care Management Program as a benefit of patient's [employer] Medicare for CHF Verbal permission was given for [facility] services via [place].  [name] gave [phone] as the best number to reach her.  CM placed a call to [name] for initial telephone call but was unsuccessful.   Plan CM will Attempt another outreach call to pt next week

## 2016-09-22 ENCOUNTER — Ambulatory Visit (INDEPENDENT_AMBULATORY_CARE_PROVIDER_SITE_OTHER): Payer: Medicare Other | Admitting: Family Medicine

## 2016-09-22 ENCOUNTER — Other Ambulatory Visit: Payer: Self-pay | Admitting: *Deleted

## 2016-09-22 ENCOUNTER — Encounter: Payer: Self-pay | Admitting: Family Medicine

## 2016-09-22 VITALS — BP 148/88 | HR 80 | Temp 97.5°F | Wt 113.5 lb

## 2016-09-22 DIAGNOSIS — I509 Heart failure, unspecified: Secondary | ICD-10-CM

## 2016-09-22 DIAGNOSIS — R0602 Shortness of breath: Secondary | ICD-10-CM

## 2016-09-22 DIAGNOSIS — I50810 Right heart failure, unspecified: Secondary | ICD-10-CM

## 2016-09-22 LAB — CBC WITH DIFFERENTIAL/PLATELET
BASOS PCT: 0.7 % (ref 0.0–3.0)
Basophils Absolute: 0.1 10*3/uL (ref 0.0–0.1)
EOS ABS: 0.2 10*3/uL (ref 0.0–0.7)
EOS PCT: 2.3 % (ref 0.0–5.0)
HEMATOCRIT: 39.3 % (ref 36.0–46.0)
Hemoglobin: 13.1 g/dL (ref 12.0–15.0)
LYMPHS PCT: 21.3 % (ref 12.0–46.0)
Lymphs Abs: 2.1 10*3/uL (ref 0.7–4.0)
MCHC: 33.5 g/dL (ref 30.0–36.0)
MCV: 105.4 fl — AB (ref 78.0–100.0)
MONOS PCT: 11.2 % (ref 3.0–12.0)
Monocytes Absolute: 1.1 10*3/uL — ABNORMAL HIGH (ref 0.1–1.0)
NEUTROS ABS: 6.3 10*3/uL (ref 1.4–7.7)
Neutrophils Relative %: 64.5 % (ref 43.0–77.0)
PLATELETS: 383 10*3/uL (ref 150.0–400.0)
RBC: 3.72 Mil/uL — ABNORMAL LOW (ref 3.87–5.11)
RDW: 14.4 % (ref 11.5–15.5)
WBC: 9.8 10*3/uL (ref 4.0–10.5)

## 2016-09-22 LAB — BASIC METABOLIC PANEL
BUN: 15 mg/dL (ref 6–23)
CHLORIDE: 103 meq/L (ref 96–112)
CO2: 33 meq/L — AB (ref 19–32)
CREATININE: 0.59 mg/dL (ref 0.40–1.20)
Calcium: 9.4 mg/dL (ref 8.4–10.5)
GFR: 102 mL/min (ref >60.00)
Glucose, Bld: 98 mg/dL (ref 70–99)
POTASSIUM: 3.4 meq/L — AB (ref 3.5–5.1)
Sodium: 141 mEq/L (ref 135–145)

## 2016-09-22 MED ORDER — IPRATROPIUM-ALBUTEROL 20-100 MCG/ACT IN AERS: 1.0000 | INHALATION_SPRAY | Freq: Four times a day (QID) | RESPIRATORY_TRACT | 1 refills | Status: DC

## 2016-09-22 NOTE — Progress Notes (Signed)
Admit date: 09/11/2016 Discharge date: 09/15/2016  Admitted From: Home Disposition:  Home   Recommendations for Outpatient Follow-up:  1. Follow up with PCP in 1-2 weeks 2. Discuss referral for pulmonary function tests 3. Use incentive spirometer frequently throughout the day 4. Please obtain BMP/CBC in one week 5. Please follow up on the following pending results:  Home Health: Yes- PT/ RN  Equipment/Devices: Oxygen 2L while oxygenating  Discharge Condition: Stable CODE STATUS: DNR Diet recommendation: Heart Healthy   Brief/Interim Summary: Valerie Hoe Montgomeryis a 83 y.o.femalewith medical history significant of atrial fibrillation, CHF, HTN, hypothyroidism, DM Type II, GERD, and nonspecific lung disease presents for worsening shortness of breath for 1 week. Patient reports that her ankles started swelling 1 week ago and that she became increasingly short of breath as the week went on. She said she felt like she needed to come in a day earlier due to her shortness of breath. She does not wear oxygen at home. Patient reports swelling has increased from her feet, to her ankles to her legs. She reports she has been able to lay flat to sleep- she sleeps on her side. No fevers, chills, no cough, no chest pain or pain with breathing. No nausea or vomiting. No recent travel and no sick contacts. Never had previous hospitalizations due to congestive heart failure. Patient denies history of underlying pulmonary disease. Over hospitalization patient was seen by PT and found to be appropriate for home health PT.  She underwent CT scan on 09/14/16 which showed There is small bilateral pleural effusion. Atelectasis noted in right lower lobe posteriorly. Atelectasis or infiltrate in left lower lobe posteriorly. Again noted bilateral emphysematous changes. No pulmonary edema.  She was instructed to use incentive spirometer frequently throughout the day.  She was also encouraged to follow up with  her PCP and discuss referral for pulmonary function tests.  She, her son and her husband voice understanding.  Patient was set up with home oxygen and discharged on 09/15/16 in stable condition.  Discharge Diagnoses:  Principal Problem:   Acute CHF (congestive heart failure) (HCC) Active Problems:   Hyperlipidemia   Essential hypertension, benign   Persistent atrial fibrillation (HCC)   Hypothyroidism   Diet-controlled diabetes mellitus (HCC)   GERD (gastroesophageal reflux disease)   Chronic nonspecific lung disease (HCC)   Right heart failure, NYHA class 3   Hypokalemia  ================================================ Recently admitted for CHF exacerbation. Was diuresed and discharged home. Also discussion about outpatient pulmonary function evaluation. Discussed with patient's son at office visit today. She may not be on her inhaler in the meantime.  D/w pt and family.  She didn't recall for certain.  She has known memory deficits.    She has been using  incentive spirometer frequently in the meantime. Still is still on supplemental O2, this was a recent start.    Living at home currently, with support.  Neighbors are helping family.  Family is considering options for extra help at home. Son patient were both open to getting social work involved to see what extra help she can get at home.  Otherwise her med list was verified. We talked about items that need addressing as an outpatient. See above.  PMH and SH reviewed  ROS: Per HPI unless specifically indicated in ROS section   Meds, vitals, and allergies reviewed.   GEN: nad, alert and oriented to the year but not to the month. On supplemental oxygen via nasal cannula.  Chronically ill but not acutely ill  appearing elderly thin woman HEENT: mucous membranes moist NECK: supple w/o LA CV: rrr. PULM: ctab, no inc wob ABD: soft, +bs EXT: no edema SKIN: no acute rash

## 2016-09-22 NOTE — Progress Notes (Signed)
Pre visit review using our clinic review tool, if applicable. No additional management support is needed unless otherwise documented below in the visit note. 

## 2016-09-22 NOTE — Patient Instructions (Signed)
Go to the lab on the way out.  We'll contact you with your lab report. Rosaria Ferries will call about your referral. Check your med list at home.   Restart the inhaler.   Continue using the incentive spirometer frequently.   I'll check on a Education officer, museum.  Take care.  Glad to see you.

## 2016-09-22 NOTE — Patient Outreach (Signed)
[facility]) Care Management  09/22/2016  [name] [birth year] [id number]  [name] Montgomeryis a 83 y.o.femalewith medical history significant of atrial fibrillation, CHF, HTN, hypothyroidism, DM Type II, GERD, and nonspecific lung disease with worsening shortness of breath for 1 week prior to her inpatient  Admission on 09/11/16.    Mrs [name] was referred to [place] [facility] for CHF Disease Management and care coordination.   I was able to reach Mrs. [name]'s husband by phone today. He told me she was at a doctor appointment. I notified him I would speak with Mrs. [name] later this week.   Plan: We will reach out to Mrs. [name] again no later than Thursday.    [place] [facility]  [phone]

## 2016-09-23 DIAGNOSIS — I509 Heart failure, unspecified: Secondary | ICD-10-CM | POA: Diagnosis not present

## 2016-09-23 DIAGNOSIS — I130 Hypertensive heart and chronic kidney disease with heart failure and stage 1 through stage 4 chronic kidney disease, or unspecified chronic kidney disease: Secondary | ICD-10-CM | POA: Diagnosis not present

## 2016-09-23 NOTE — Assessment & Plan Note (Signed)
Refer for pulmonary evaluation, given the new oxygen requirement in to see how much lung disease is contributing in addition to her known heart failure. Continue incentive spirometer and flutter valve the meantime. Continue supplemental oxygen for now. Refer for social work. She is likely going to need escalating care at home. Patient's son both agree with this. Discussed with son about med list. He will verify also medications at home. Restart inhaler, if not already in use. See notes on labs. At this point still okay for outpatient follow-up, with the expectation of needing extra care and supervision at home.

## 2016-09-24 ENCOUNTER — Encounter: Payer: Self-pay | Admitting: Licensed Clinical Social Worker

## 2016-09-24 ENCOUNTER — Encounter: Payer: Self-pay | Admitting: *Deleted

## 2016-09-24 ENCOUNTER — Encounter: Payer: Self-pay | Admitting: Family Medicine

## 2016-09-24 ENCOUNTER — Other Ambulatory Visit: Payer: Self-pay | Admitting: *Deleted

## 2016-09-24 ENCOUNTER — Other Ambulatory Visit: Payer: Self-pay | Admitting: Licensed Clinical Social Worker

## 2016-09-24 DIAGNOSIS — I130 Hypertensive heart and chronic kidney disease with heart failure and stage 1 through stage 4 chronic kidney disease, or unspecified chronic kidney disease: Secondary | ICD-10-CM | POA: Diagnosis not present

## 2016-09-24 DIAGNOSIS — I509 Heart failure, unspecified: Secondary | ICD-10-CM | POA: Diagnosis not present

## 2016-09-24 NOTE — Patient Outreach (Signed)
[facility]) Care Management  09/24/2016  JERRIAH INES 01-09-1926 [id number]  Tayen Narang Montgomeryis a 83 y.o.femalewith medical history significant of atrial fibrillation, CHF, HTN, hypothyroidism, DM Type II, GERD, and nonspecific lung disease with worsening shortness of breath for 1 week prior to her inpatient Admission on 09/11/16.   [name] referred to [place] Management for CHF Disease Management and care coordination. I have been unsuccessful in outreach contacts thus far, barring one brief conversation with her husband on Tuesday. Dr. Damita Dunnings saw Mrs. Hinds in the office on Tuesday and asked for our involvement, citing concerns about level of care concerns at home.   I reached out to the [place] at the office today to inquire about other contact information she might have for the patient or family so that I can continue efforts to reach her.   Plan: I have held a block of time on my appointment schedule for Monday, 09/28/16 so that I can see her at home if she agrees/is available.    [place] Management  [phone]

## 2016-09-24 NOTE — Patient Outreach (Signed)
Assessment:  CSW received referral on [name]. CSW completed chart review on client on 09/24/16. Client sees. Dr. Ria [name] as primary care doctor. Client was hospitalized recently and was later discharged from the hospital to return to her home with needed supports in place. Client receives support from her spouse and from her son.  Client has been receiving [place] with home health nursing and home health physical therapy services. Client has prescribed medications and is taking medications as prescribed.  CSW spoke via phone on 09/24/16 with  [name]. [name]. CSW verified identity of   [name]. [name]. CSW received verbal permission from client on 09/24/16 for CSW to speak with client about current client needs and status. CSW spoke with client about [facility] program and [facility] program services. Client said she uses a walker to help her walk.  She said she has some difficulty remembering some details. She said she likes for callers to leave information on her phone answering machine so she can replay information later to absorb this information left by callers. She said she has support from her spouse and from her son. Her family helps in transporting client to and from client's scheduled medical appointments. CSW and client spoke of client care plan. CSW encouraged that client participate in all scheduled client in home physical therapy sessions for client in the next 30 days.  Client is receiving in home physical therapy sessions through [facility]. CSW spoke with client about [place] support services. 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  CSW and client completed needed [facility] assessments for client. CSW thanked client for phone call with CSW on 09/24/16. CSW encouraged client or her spouse to call CSW at [phone] as needed to discuss social work needs of client.   Plan  Client to participate in all scheduled client in home physical therapy sessions for client in the  next 30 days  CSW to collaborate with RN [name] in monitoring needs of client.  CSW to call client or family representative in 4 weeks to assess client needs at that time.  [name].[name] MSW, LCSW Licensed Clinical Social Worker [facility] Care Management [phone]

## 2016-09-25 ENCOUNTER — Other Ambulatory Visit: Payer: Self-pay | Admitting: *Deleted

## 2016-09-25 NOTE — Patient Outreach (Signed)
[facility]) Care Management  09/25/2016  ROREY HODGES Nov 14, 1925 [id number]  Valerie Kimball Montgomeryis a 83 y.o.femalewith medical history significant of atrial fibrillation, CHF, HTN, hypothyroidism, DM Type II, GERD, and nonspecific lung disease with worsening shortness of breath for 1 week prior to her inpatient Admission on 09/11/16.   Valerie Montgomerywas referred to [place] Management for CHF Disease Management and care coordination. I have been unsuccessful in outreach contacts thus far, barring one brief conversation with her husband on Tuesday. Dr. Damita Dunnings saw [name] in the office on Tuesday and asked for our involvement, citing concerns about level of care concerns at home.   I reached out to the [place] at the office this week to inquire about other contact information she might have for the patient or family so that I can continue efforts to reach her. A message was received by [name] who confirmed the appropriate number as the one listed in the medical record. In addition, I Valerie Forrest LCSW with [facility] Care management was able to make contact with [name] yesterday. He confirmed that she is receiving home health services through [facility]. We collaborated about [name]'s needs and I placed another call to her today. [name] answered and said his wife was resting but agreed to a visit from me on Monday.   Plan: I will see [name] at home on Monday for our initial home visit and will follow up with her primary care provider regarding care management needs.    [place] Management  [phone]

## 2016-09-28 ENCOUNTER — Other Ambulatory Visit: Payer: Self-pay | Admitting: *Deleted

## 2016-09-28 NOTE — Patient Outreach (Signed)
[facility]) Care Management  09/28/2016  [name] 03-05-1926 [id number]  [name] is an 83 y.o. female with medical history significant of atrial fibrillation, CHF, HTN, hypothyroidism, DM Type II, GERD, and nonspecific lung disease with worsening shortness of breath for 1 week prior to her inpatient Admission on 09/11/16.   Valerie Montgomerywas referred to [place] Management for CHF Disease Management and care coordination. I have been unsuccessful in outreach contacts thus far, barring one brief conversation with her husband on Tuesday. Dr. Damita Dunnings saw [name] in the office on Tuesday and asked for our involvement, citing concerns about level of care concerns at home.   I was scheduled to see [name] at home this morning but due to inclement weather our appointment was rescheduled for Wednesday at 1pm. I was able to speak briefly with [name] who had just completed her home health PT. She asked if I would speak with her caregiver, [name] and gave me full permission to speak freely with [name] about her health, care, and needs.   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 [name] lives with her husband. She has a son who lives in  [place] and comes to see [name] and her husband once weekly on average.   [name] reports that [name] is being seen by a home health nurse weekly and a home health physical therapist twice weekly. She is helping [name] weigh daily and record. [name] is able to verbalize the need to report a weight gain or worsening of symptoms and says that [name] weight has not increased more than 1/2 a pound since her discharge from the hospital. [name] provides assistance with medication management and is knowledge about [name] medications, prescribed treatment plan, and dietary  management needs.   [name], her husband, and [name] agreed to a home visit from me on Wednesday at 1pm. I provided my contact information and encouraged [name] to have [name] or her husband or herself call me if she is unable to reach her provider or home care nurse.   Plan: I will see [name] at home on Wednesday at [place] [facility]  [phone]

## 2016-09-29 DIAGNOSIS — I130 Hypertensive heart and chronic kidney disease with heart failure and stage 1 through stage 4 chronic kidney disease, or unspecified chronic kidney disease: Secondary | ICD-10-CM | POA: Diagnosis not present

## 2016-09-29 DIAGNOSIS — I509 Heart failure, unspecified: Secondary | ICD-10-CM | POA: Diagnosis not present

## 2016-09-30 ENCOUNTER — Other Ambulatory Visit: Payer: Self-pay | Admitting: *Deleted

## 2016-09-30 DIAGNOSIS — I509 Heart failure, unspecified: Secondary | ICD-10-CM | POA: Diagnosis not present

## 2016-09-30 DIAGNOSIS — I130 Hypertensive heart and chronic kidney disease with heart failure and stage 1 through stage 4 chronic kidney disease, or unspecified chronic kidney disease: Secondary | ICD-10-CM | POA: Diagnosis not present

## 2016-09-30 NOTE — Patient Outreach (Signed)
[facility]) Care Management   09/30/2016  [name] 1925-10-15 [id number]  [name] is an 83 y.o. female with medical history significant of atrial fibrillation, CHF, HTN, hypothyroidism, DM Type II, GERD, and nonspecific lung disease with worsening shortness of breath for 1 week prior to her inpatient Admission on 09/11/16. She was discharged to home with home health RN and PT visits.  Valerie Montgomerywas referred to [place] Management for CHF Disease Management and care coordination and level of care concerns.  [facility] CM is  seeing her at home today for her initial home visit.   Subjective: BP pulse 83 O2 sat 97%   Objective:   Review of Systems  Constitutional: Positive for malaise/fatigue. Negative for chills, diaphoresis and fever.  HENT: Positive for hearing loss. Negative for congestion, ear discharge, ear pain, nosebleeds, sinus pain, sore throat and tinnitus.   Eyes: Negative for blurred vision, double vision, photophobia, pain, discharge and redness.  Respiratory: Negative for cough, hemoptysis, sputum production, shortness of breath, wheezing and stridor.        Crackle bilaterally  Cardiovascular: Negative for chest pain, palpitations, orthopnea, claudication, leg swelling and PND.       Irregular heart rate  Gastrointestinal: Negative for abdominal pain, blood in stool, constipation, diarrhea, heartburn, melena, nausea and vomiting.  Genitourinary: Negative for dysuria, flank pain, frequency, hematuria and urgency.  Musculoskeletal: Negative for back pain, falls, joint pain, myalgias and neck pain.  Skin: Negative.  Negative for itching and rash.  Neurological: Positive for weakness. Negative for dizziness, tingling, tremors, sensory change, speech change, focal weakness, loss of consciousness and headaches.  Endo/Heme/Allergies: Negative for environmental allergies and polydipsia.  Psychiatric/Behavioral: Positive for depression and memory  loss. Negative for hallucinations, substance abuse and suicidal ideas. The patient is not nervous/anxious and does not have insomnia.        Reports depression. Informed CM "it is not worth living past age 30.", "you have done all you need to do by then"  "Just look at me"     Physical Exam  Constitutional: She is oriented to person, place, and time.  Sickly appearing  HENT:  Head: Normocephalic and atraumatic.  Eyes: Conjunctivae are normal.  Cardiovascular: Intact distal pulses.  Exam reveals no gallop and no friction rub.   No murmur heard. Irregular heart rate  Respiratory: Effort normal. No respiratory distress. She has no wheezes. She has no rales. She exhibits no tenderness.  Crackles heard bilaterally   GI: She exhibits distension. She exhibits no mass. There is no rebound and no guarding.  Musculoskeletal: She exhibits no edema, tenderness or deformity.  Neurological: She is alert and oriented to person, place, and time.  Skin: Skin is warm and dry.    Encounter Medications:   Outpatient Encounter Prescriptions as of 09/30/2016  Medication Sig  . acetaminophen (TYLENOL) 500 MG tablet Take 2 tablets (1,000 mg total) by mouth 3 (three) times daily.  . Calcium-Magnesium-Vitamin D [phone] MG-MG-UNIT TABS Take 1 tablet by mouth 2 (two) times daily.  Marland Kitchen donepezil (ARICEPT) 5 MG tablet Take 1 tablet (5 mg total) by mouth at bedtime.  . feeding supplement, GLUCERNA SHAKE, (GLUCERNA SHAKE) LIQD Take 237 mLs by mouth 2 (two) times daily between meals.  . felodipine (PLENDIL) 2.5 MG 24 hr tablet TAKE 1 TABLET DAILY  . ferrous sulfate 325 (65 FE) MG tablet Take 325 mg by mouth daily with breakfast.  . furosemide (LASIX) 20 MG tablet Take 2 tablets (40  mg total) by mouth daily as needed.  . Ipratropium-Albuterol (COMBIVENT RESPIMAT) 20-100 MCG/ACT AERS respimat Inhale 1 puff into the lungs every 6 (six) hours.  Marland Kitchen latanoprost (XALATAN) 0.005 % ophthalmic solution Place 1 drop into  both eyes at bedtime.   Marland Kitchen levothyroxine (SYNTHROID, LEVOTHROID) 50 MCG tablet TAKE 1 TABLET DAILY  . lidocaine (LIDODERM) 5 % Place 1 patch onto the skin daily. Remove & Discard patch within 12 hours or as directed by MD  . LORazepam (ATIVAN) 0.5 MG tablet Take 1 tablet (0.5 mg total) by mouth every 8 (eight) hours as needed for anxiety.  . Misc Natural Products (FIBER 7) POWD Take by mouth daily.  . Multiple Vitamin (MULTIVITAMIN) capsule Take 1 capsule by mouth every morning.   . Omega-3 Fatty Acids (FISH OIL CONCENTRATE) 1000 MG CAPS Take 2 capsules by mouth 2 (two) times daily.    . potassium chloride SA (K-DUR,KLOR-CON) 20 MEQ tablet Take 1 tablet (20 mEq total) by mouth daily as needed (with lasix).  Marland Kitchen sertraline (ZOLOFT) 100 MG tablet Take 1 tablet (100 mg total) by mouth daily.  . vitamin B-12 (CYANOCOBALAMIN) 500 MCG tablet Take 500 mcg by mouth daily.  Marland Kitchen warfarin (COUMADIN) 2.5 MG tablet Take 1 tablet daily except 1 1/2 tablets on Mondays and Thursdays (Patient taking differently: Take 3.75 mg by mouth every evening. )   Assessment:    Level of Care Concerns -  Valerie. [name] lives at home with  her husband in [place]. She has a Teacher, adult education (present today for our visit) who sees Valerie. [name] three times daily as per an arrangement made with the family. She has a son who lives in  [place] and comes to see Valerie. [name] and her husband once weekly on average.   [name] is helping Valerie. [name] weigh daily and record. [name] is able to verbalize the need to report a weight gain or worsening of symptoms and says that Valerie. [name]'s weight has not increased more than 1/2 a pound since her discharge from the hospital. Weight today was 114.8 (on 09/29/16 wt 115, on 09/28/16 wt 115.8) [name] provides assistance with medication management and is knowledge about Valerie. [name]'s medications, prescribed treatment plan, and dietary management needs.  [name] voices disinterest in  eating with a poor appetite.  [name] reports she gets in fluids Generally 2-3 12 oz glasses/day. Alvis Lemmings RN, Betty visited at the end of [facility] CM visit.  Chronic Health Condition (CHF) - Dr. Damita Dunnings, primary care, referred Valerie. Portee for pulmonary evaluation to [employer] on 10/13/16 at 1600; she is on continuous O2 at 2 L?[place] and he would like to have further evaluation of how much lung disease is contributing to her shortness of breath.  She was scheduled to see CV, Rozann Lesches on 11/23/16 but prefers to change providers. CM assisted to with obtaining pt an appt with Bunnie Domino for 10/16/16. She is also wanting to switch primary provider from Dr Danise Mina to Dr Damita Dunnings whom she recently seen on  She is scheduled to see her podiatrist, Gardiner Barefoot on 12/02/16 at 1400.  She saw her dentist on "last week and is to see him tomorrow for an adjustment" Valerie Sonnen voiced concerns with depression while [facility] CM, [name] an Psychologist, prison and probation services present.  Informed CM "it is not worth living past age 55.", "you have done all you need to do by then",  "Just look at me"  She is scheduled per [name] to be seen on 10/26/16 by  Bambi Cottle, LPC, at [facility] for her first evaluation for counseling.  Valerie Stolze did smile x 1 after CM hugged her prior to leaving. All of Valerie Reichenbach appointments are managed by [name] with [place] calendar.  Plan:  I will go to Valerie Hoskin's home for a home visit was agreed upon for March 28 2-18 at I pm prior to leaving her home.

## 2016-10-01 DIAGNOSIS — I130 Hypertensive heart and chronic kidney disease with heart failure and stage 1 through stage 4 chronic kidney disease, or unspecified chronic kidney disease: Secondary | ICD-10-CM | POA: Diagnosis not present

## 2016-10-01 DIAGNOSIS — I509 Heart failure, unspecified: Secondary | ICD-10-CM | POA: Diagnosis not present

## 2016-10-05 ENCOUNTER — Ambulatory Visit (INDEPENDENT_AMBULATORY_CARE_PROVIDER_SITE_OTHER): Payer: Medicare Other | Admitting: *Deleted

## 2016-10-05 ENCOUNTER — Telehealth: Payer: Self-pay | Admitting: Family Medicine

## 2016-10-05 DIAGNOSIS — I481 Persistent atrial fibrillation: Secondary | ICD-10-CM | POA: Diagnosis not present

## 2016-10-05 DIAGNOSIS — I4819 Other persistent atrial fibrillation: Secondary | ICD-10-CM

## 2016-10-05 DIAGNOSIS — G458 Other transient cerebral ischemic attacks and related syndromes: Secondary | ICD-10-CM

## 2016-10-05 DIAGNOSIS — Z5181 Encounter for therapeutic drug level monitoring: Secondary | ICD-10-CM

## 2016-10-05 LAB — POCT INR: INR: 1.8

## 2016-10-05 NOTE — Telephone Encounter (Signed)
This request came from son. Ongoing progressive deterioration of chronic medical issues.  If pt desires, may make switch but would leave it up to Dr Damita Dunnings to decide whether to accept. Pt and son are very pleasant.

## 2016-10-05 NOTE — Telephone Encounter (Signed)
Patient's son,Lee,called.  He said patient saw Dr.Duncan and was very impressed and wanted to know if she could switch from [place] to [place].  Please advise. Truman Hayward said it's ok to leave a detailed message on his voice mail,if he doesn't answer.

## 2016-10-06 DIAGNOSIS — I509 Heart failure, unspecified: Secondary | ICD-10-CM | POA: Diagnosis not present

## 2016-10-06 DIAGNOSIS — I130 Hypertensive heart and chronic kidney disease with heart failure and stage 1 through stage 4 chronic kidney disease, or unspecified chronic kidney disease: Secondary | ICD-10-CM | POA: Diagnosis not present

## 2016-10-06 NOTE — Telephone Encounter (Signed)
Patient's son notified of Dr.Duncan's comments.

## 2016-10-06 NOTE — Telephone Encounter (Signed)
I take this as a compliment (and I was glad to see them) but given the complexity of her situation, I think it is best for her to stay with her PCP and not transfer her care.

## 2016-10-07 ENCOUNTER — Telehealth: Payer: Self-pay | Admitting: Family Medicine

## 2016-10-07 ENCOUNTER — Telehealth: Payer: Self-pay

## 2016-10-07 DIAGNOSIS — I130 Hypertensive heart and chronic kidney disease with heart failure and stage 1 through stage 4 chronic kidney disease, or unspecified chronic kidney disease: Secondary | ICD-10-CM | POA: Diagnosis not present

## 2016-10-07 DIAGNOSIS — I509 Heart failure, unspecified: Secondary | ICD-10-CM | POA: Diagnosis not present

## 2016-10-07 NOTE — Telephone Encounter (Signed)
[name] with [facility] HC left v/m; pt has increased swelling 1+ bilateral lower leg;having little bit SOB; pt taking lasix 20 mg daily. Betty request verbal order to continue [facility] nursing to continue to assess pt.

## 2016-10-07 NOTE — Telephone Encounter (Signed)
plz call -  Recommend she take lasix 20mg  2 tablets (total 40mg ) daily for 3 days and call us with update.   plz send new glucose meter with strips to [facility]. Check with [name] what brand is better for insurance purposes.

## 2016-10-07 NOTE — Telephone Encounter (Signed)
[name] (DPR signed) left v/m; pt is having swelling in ankles; pt has gained 2 lbs; pt presently taking one lasix 20 mg daily but this has not helped the swelling in pts ankles.[name] said the bottle of lasix instructions are take 1/2 tab daily and pts med list has lasix 20 mg two tabs daily prn. Tamara request cb to clarify how pt should be taking lasix. Also pts glucose meter is not working; pt needs new meter ,strips and lancets to [facility].

## 2016-10-07 NOTE — Telephone Encounter (Signed)
[name] notified as instructed by telephone and verbalized understanding. [name] stated that she will check with the insurance company and will call back with information regarding the meter.

## 2016-10-07 NOTE — Telephone Encounter (Signed)
Ok to continue [facility] nursing. See my other phone note dated today for instructions on lasix.

## 2016-10-08 DIAGNOSIS — I509 Heart failure, unspecified: Secondary | ICD-10-CM | POA: Diagnosis not present

## 2016-10-08 DIAGNOSIS — I130 Hypertensive heart and chronic kidney disease with heart failure and stage 1 through stage 4 chronic kidney disease, or unspecified chronic kidney disease: Secondary | ICD-10-CM | POA: Diagnosis not present

## 2016-10-08 MED ORDER — FREESTYLE LANCETS MISC: 1 refills | Status: AC

## 2016-10-08 MED ORDER — FREESTYLE LITE DEVI: 1 refills | Status: AC

## 2016-10-08 MED ORDER — GLUCOSE BLOOD VI STRP
ORAL_STRIP | 1 refills | Status: AC
Start: 2016-10-08 — End: ?

## 2016-10-08 NOTE — Addendum Note (Signed)
Addended by: Helene Shoe on: 10/08/2016 03:39 PM   Modules accepted: Orders

## 2016-10-08 NOTE — Telephone Encounter (Signed)
Betty notified. [name] had already been notified about lasix instructions.

## 2016-10-08 NOTE — Telephone Encounter (Addendum)
Ins will cover freestyle lite meter, strips and lancets.sent to [facility] [place] as instructed.

## 2016-10-12 ENCOUNTER — Telehealth: Payer: Self-pay

## 2016-10-12 DIAGNOSIS — I509 Heart failure, unspecified: Secondary | ICD-10-CM | POA: Diagnosis not present

## 2016-10-12 DIAGNOSIS — I130 Hypertensive heart and chronic kidney disease with heart failure and stage 1 through stage 4 chronic kidney disease, or unspecified chronic kidney disease: Secondary | ICD-10-CM | POA: Diagnosis not present

## 2016-10-12 NOTE — Telephone Encounter (Signed)
[name] with [facility] HH left v/m; pt has gained 5 lbs since last seek; measurements are the same for the calf, ankle and abdomen; pt has SOB when walks and sometimes when takes deep breath has CP.

## 2016-10-12 NOTE — Telephone Encounter (Signed)
Please continue lasix 40mg  daily as new lasix dose.  Call us in 3 days with weight update. - reminder we are close on Friday.  Come in sooner if worsening chest pain or shortness of breath despite daily lasix 40mg .

## 2016-10-12 NOTE — Telephone Encounter (Signed)
[name] notified and verbalized understanding. She will notify patient/[name].

## 2016-10-12 NOTE — Telephone Encounter (Signed)
[name] left v/m (DPR signed)Per instructions pt took lasix 2 tabs daily for 3 days which ended on 10/09/16. Pt lost one pound during that 3 day period. Today pt has gained 4.5 lbs since 03/11/17. Pt did not get Lasix on 10/10/16 or 10/11/16. [name] will give pt 2 more lasix today. [name] request cb.

## 2016-10-13 ENCOUNTER — Telehealth: Payer: Self-pay | Admitting: Pulmonary Disease

## 2016-10-13 ENCOUNTER — Ambulatory Visit (INDEPENDENT_AMBULATORY_CARE_PROVIDER_SITE_OTHER)
Admission: RE | Admit: 2016-10-13 | Discharge: 2016-10-13 | Disposition: A | Discharge status: Home / Self Care | Payer: Medicare Other | Source: Ambulatory Visit | Attending: Pulmonary Disease | Admitting: Pulmonary Disease

## 2016-10-13 ENCOUNTER — Encounter: Payer: Self-pay | Admitting: Pulmonary Disease

## 2016-10-13 ENCOUNTER — Ambulatory Visit (INDEPENDENT_AMBULATORY_CARE_PROVIDER_SITE_OTHER): Payer: Medicare Other | Admitting: Pulmonary Disease

## 2016-10-13 DIAGNOSIS — J9611 Chronic respiratory failure with hypoxia: Secondary | ICD-10-CM

## 2016-10-13 DIAGNOSIS — I5032 Chronic diastolic (congestive) heart failure: Secondary | ICD-10-CM | POA: Diagnosis not present

## 2016-10-13 DIAGNOSIS — J449 Chronic obstructive pulmonary disease, unspecified: Secondary | ICD-10-CM

## 2016-10-13 DIAGNOSIS — J90 Pleural effusion, not elsewhere classified: Secondary | ICD-10-CM

## 2016-10-13 DIAGNOSIS — Z9981 Dependence on supplemental oxygen: Secondary | ICD-10-CM

## 2016-10-13 DIAGNOSIS — R0602 Shortness of breath: Secondary | ICD-10-CM | POA: Diagnosis not present

## 2016-10-13 MED ORDER — UMECLIDINIUM-VILANTEROL 62.5-25 MCG/INH IN AEPB: 1.0000 | INHALATION_SPRAY | Freq: Every day | RESPIRATORY_TRACT | 0 refills | Status: DC

## 2016-10-13 NOTE — Telephone Encounter (Signed)
PFT 11/28/15: FVC 2.00 L (73%) FEV1 1.39 L (73%) FEV1/FVC 0.69 FEF 25-75 0.84 L (69%)  IMAGING CT CHEST W/O 09/14/16 (personally reviewed by me): Mild compression deformity of upper endplate T10 & C62 vertebral bodies.  Moderate compression deformity L1 vertebral body. prior vertebroplasty L2 & L3. No parenchymal opacity appreciated. Compressive atelectasis from small bilateral pleural effusions. Subcarinal lymph node measuring approximately 1.2 cm short axis by my review.  CARDIAC TTE (09/12/16): Moderate LVH with EF 60-65%. Unable to assess diastolic function due to atrial fibrillation. No regional wall motion abnormalities. LA & RA severely dilated. RV moderately dilated with moderate to severe reduction in systolic function. No aortic stenosis or regurgitation. Aortic root normal in size. Mild mitral regurgitation without stenosis. No pulmonic stenosis or regurgitation. Moderate tricuspid regurgitation without stenosis. Large left pleural effusion. Trivial circumferential pericardial effusion.  LABS 09/11/16 BNP:  723 (previously 364 on 07/26/16) Troponin I:  <0.03

## 2016-10-13 NOTE — Telephone Encounter (Signed)
IMAGING CT CHEST W/O 09/14/16 (personally reviewed by me):

## 2016-10-13 NOTE — Progress Notes (Signed)
Patient seen in the office today and instructed on use of Anoro.  Patient and son expressed understanding and demonstrated technique.

## 2016-10-13 NOTE — Patient Instructions (Addendum)
   Hold off on restarting your Combivent inhaler.   We are giving you an inhaler today called Anoro - inhale 1 puff off this inhaler once daily at the same time every day.   Stop using the Anoro inhaler if you have any blurry vision, trouble urinating, or any other problems with the medication and call to let me know.  If the Anoro seems to help your breathing and you tolerate it well call and we will send in a prescription to your pharmacy as well as a prescription for a replacement inhaler to use instead of your Combivent inhaler.  Please try to limit your fluids to 2 liters daily, sodium to 2000 mg daily, and remember to weigh yourself daily. You should record your weight every morning and notify your doctor if your weight goes up by more than 2-3 pounds as you may need more Lasix/Furosemide.  Call my office or e-mail/message me if you have any new breathing problems or questions before your next appointment.  TESTS ORDERED: 1. Serum Alpha-1 Antitrypsin Phenotype today 2. CXR PA/LAT and Bilateral Decubitus X-rays today 3. Full PFTs before next appointment 4. 6MWT with oxygen titration before next appointment

## 2016-10-13 NOTE — Progress Notes (Signed)
Subjective:    Patient ID: [name], female    DOB: 11-03-1925, 83 y.o.   MRN: [id number]  HPI The patient was hospitalized in February with congestive heart failure. She was prescribed oxygen at that time and has continued to use it since. Her degree of dyspnea doesn't seem to have improved since her hospitalization but hasn't deteriorated either. She reports dyspnea that is fairly constant but definitely worse when she exerts herself. She denies any chest pain, tightness, or pressure. She denies any consistent cough. No audible wheezing. Remote history of bronchitis. No prior history of pneumonia. No history of asthma or breathing problems as a child. No seasonal sinus congestion, pressure or drainage. She does report her dyspnea has significant improved with the use of her Combivent Respimat but doesn't believe she has been on any other inhalers. She was noted to have pleural effusions on prior echocardiogram and CT imaging. She denies any prior thoracentesis. She describes a fairly constant lower back pain. She reports her baseline weight is 108-110 pounds. She admits she doesn't weight herself daily always. Reports she is compliant with her Lasix and has had decrease in her leg edema.   Review of Systems No fever, chills, or sweats. No nausea or emesis. She denies any abdominal pain. A pertinent 14 point review of systems is negative except as per the history of presenting illness.  Allergies  Allergen Reactions  . Propoxyphene N-Acetaminophen Shortness Of Breath  . Codeine Nausea And Vomiting  . Meperidine Hcl Nausea And Vomiting  . Morphine Nausea And Vomiting  . Shellfish Allergy Hives  . Tramadol Nausea Only    Current Outpatient Prescriptions on File Prior to Visit  Medication Sig Dispense Refill  . acetaminophen (TYLENOL) 500 MG tablet Take 2 tablets (1,000 mg total) by mouth 3 (three) times daily.    . Blood Glucose Monitoring Suppl (FREESTYLE LITE) DEVI Check blood sugar  once daily and as directed. Dx E11.9 90 each 1  . Calcium-Magnesium-Vitamin D 275-170-017 MG-MG-UNIT TABS Take 1 tablet by mouth 2 (two) times daily.    Marland Kitchen donepezil (ARICEPT) 5 MG tablet Take 1 tablet (5 mg total) by mouth at bedtime. 30 tablet 3  . feeding supplement, GLUCERNA SHAKE, (GLUCERNA SHAKE) LIQD Take 237 mLs by mouth 2 (two) times daily between meals. 14220 mL 0  . felodipine (PLENDIL) 2.5 MG 24 hr tablet TAKE 1 TABLET DAILY 90 tablet 1  . ferrous sulfate 325 (65 FE) MG tablet Take 325 mg by mouth daily with breakfast.    . furosemide (LASIX) 20 MG tablet Take 2 tablets (40 mg total) by mouth daily.    Marland Kitchen glucose blood (FREESTYLE LITE) test strip Check blood sugar once daily and as directed. Dx E11.9 100 each 1  . Lancets (FREESTYLE) lancets Check blood sugar once daily and as directed. Dx E11.9 100 each 1  . latanoprost (XALATAN) 0.005 % ophthalmic solution Place 1 drop into both eyes at bedtime.     Marland Kitchen levothyroxine (SYNTHROID, LEVOTHROID) 50 MCG tablet TAKE 1 TABLET DAILY 90 tablet 3  . lidocaine (LIDODERM) 5 % Place 1 patch onto the skin daily. Remove & Discard patch within 12 hours or as directed by MD 30 patch 6  . LORazepam (ATIVAN) 0.5 MG tablet Take 1 tablet (0.5 mg total) by mouth every 8 (eight) hours as needed for anxiety. 60 tablet 0  . Misc Natural Products (FIBER 7) POWD Take by mouth daily.    . Multiple Vitamin (MULTIVITAMIN) capsule  Take 1 capsule by mouth every morning.     . Omega-3 Fatty Acids (FISH OIL CONCENTRATE) 1000 MG CAPS Take 2 capsules by mouth 2 (two) times daily.      . potassium chloride SA (K-DUR,KLOR-CON) 20 MEQ tablet Take 1 tablet (20 mEq total) by mouth daily as needed (with lasix). 30 tablet 1  . sertraline (ZOLOFT) 100 MG tablet Take 1 tablet (100 mg total) by mouth daily. 90 tablet 1  . vitamin B-12 (CYANOCOBALAMIN) 500 MCG tablet Take 500 mcg by mouth daily.    Marland Kitchen warfarin (COUMADIN) 2.5 MG tablet Take 1 tablet daily except 1 1/2 tablets on Mondays  and Thursdays (Patient taking differently: Take 3.75 mg by mouth every evening. ) 180 tablet 3  . Ipratropium-Albuterol (COMBIVENT RESPIMAT) 20-100 MCG/ACT AERS respimat Inhale 1 puff into the lungs every 6 (six) hours. (Patient not taking: Reported on 10/13/2016) 4 g 1  . [DISCONTINUED] Calcium Carbonate-Vitamin D (CALCIUM PLUS VITAMIN D PO) Take 2 tablets by mouth daily.       No current facility-administered medications on file prior to visit.     Past Medical History:  Diagnosis Date  . Anemia   . Anxiety   . Aortic sclerosis   . Arthritis   . CHF (congestive heart failure) ([place])   . Chronic atrial fibrillation ([place])   . Chronic gastritis 10/2009   Duodenitis on EGD  . CKD (chronic kidney disease) stage 2, GFR 60-89 ml/min   . Collagen vascular disease ([place])   . COPD (chronic obstructive pulmonary disease) ([place])   . Diabetes type 2, controlled ([place])    Diet controlled  . Diverticulosis   . Dry ARMD    Retinal hemorrhage (09/2013) [place]  . Essential hypertension, benign   . GERD (gastroesophageal reflux disease)   . Glaucoma    Dr. Venetia Maxon  . Hiatal hernia 2011   Small  . History of pelvic fracture April 2013  . History of rheumatic fever   . History of skin cancer   . History of TIA (transient ischemic attack)   . Hypothyroidism   . Macular degeneration   . Mixed hyperlipidemia   . Osteoporosis 04/2013   Thoracic compression fracture, on bisphosphonate and cal/vit D  . Pulmonary nodules    Stable bilateral on CT 01/2010, rec rpt yearly for 2 yrs, pt decided to stop f/u  . Seasonal allergies   . Vertebral compression fracture ([place]) 05/2016   Vertebroplasty L2/L3    Past Surgical History:  Procedure Laterality Date  . BREAST LUMPECTOMY  1983   LEFT, benign  . Carotid US  2011   mild plaque formation  . CATARACT EXTRACTION  2012   RIGHT  . CESAREAN SECTION     (and h/o 6 miscarriages)  . CHOLECYSTECTOMY N/A 02/27/2013   Procedure: LAPAROSCOPIC CHOLECYSTECTOMY;   Surgeon: Donato Heinz, MD;  Location: [facility];  Service: General;  Laterality: N/A;  . COLONOSCOPY  03/2010   int hemorrhoids, diverticulosis, no need to repeat (Dr. Sydell Axon)  . DEXA  05/2010   T score -4.5 spine, -2.4 femur; does not want to repeat  . DEXA  04/2013   T score 3.9 [facility] spine, 2.6 hip  . DILATION AND CURETTAGE OF UTERUS     x2  . ESOPHAGOGASTRODUODENOSCOPY  10/2009   small HH, multiple antric ulcerations s/p biopsy, mild chronic gastritis/duodenitis  . ESOPHAGOGASTRODUODENOSCOPY  05/26/2012   RMR: small HH, o/w normal.   . IR GENERIC HISTORICAL  04/16/2016  IR RADIOLOGIST EVAL & MGMT 04/16/2016 [facility]  . IR GENERIC HISTORICAL  06/16/2016   IR VERTEBROPLASTY LUMBAR BX INC UNI/BIL INC/INJECT/IMAGING 06/16/2016 Luanne Bras, MD [facility]  . IR GENERIC HISTORICAL  06/16/2016   IR VERTEBROPLASTY EA ADDL (T&LS) BX INC UNI/BIL INC INJECT/IMAGING 06/16/2016 Luanne Bras, MD [facility]  . LAPAROSCOPIC CHOLECYSTECTOMY  02/2013   Dr. Geroge Baseman  . PATELLA FRACTURE SURGERY  05/2006   LEFT surgically repaired with pins and wire  . TONSILLECTOMY      Family History  Problem Relation Age of Onset  . Coronary artery disease Mother 63    MI deceased  . Colon cancer Father 31  . Asthma Father   . Asthma Sister   . Stroke Paternal Grandmother   . Diabetes Neg Hx     Social History   Social History  . Marital status: Married    Spouse name: N/A  . Number of children: N/A  . Years of education: N/A   Occupational History  . Retired     Former Therapist, sports   Social History Main Topics  . Smoking status: Former Smoker    Packs/day: 1.00    Years: 30.00    Types: Cigarettes    Quit date: 10/13/1998  . Smokeless tobacco: Never Used  . Alcohol use No  . Drug use: No  . Sexual activity: No   Other Topics Concern  . 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      [facility] (10/13/16):   Originally from [place]. Moved to the [place] at 83 y.o. She has lived in [place] and moved to [place] in 1963. Retired Therapist, sports. No known TB exposure. Has dogs currently. No bird or mold exposure.          Objective:   Physical Exam BP 122/80 (BP Location: Left Arm, Patient Position: Sitting, Cuff Size: Normal)   Pulse 87   Ht 5\' 2"  (1.575 m)   Wt 119 lb 6.4 oz (54.2 kg)   BMI 21.84 kg/m  General:  Awake. Alert. No acute distress. Elderly Caucasian female. Accompanied by her son today.  Integument:  Warm & dry. No rash on exposed skin.  Extremities:  No cyanosis or clubbing.  Lymphatics:  No appreciated cervical or supraclavicular lymphadenoapthy. HEENT:  Moist mucus membranes. No oral ulcers. No scleral injection or icterus.  Cardiovascular:  Regular rate. Pitting lower extremity edema. Unable to appreciate JVD given body positioning.  Pulmonary: Diminished breath sounds bilateral lung bases with some dullness to percussion. Normal work of breathing on nasal cannula oxygen.  Abdomen: Soft. Normal bowel sounds. Mildly protuberant. Grossly nontender. Musculoskeletal:  Normal bulk and tone. Hand grip strength 5/5 bilaterally. No joint deformity or effusion appreciated. Neurological:  CN 2-12 grossly in tact. No meningismus. Moving all 4 extremities equally.  Psychiatric:  Mood and affect congruent. Speech normal rhythm, rate & tone.   PFT 11/28/15: FVC 2.00 L (73%) FEV1 1.39 L (73%) FEV1/FVC 0.69 FEF 25-75 0.84 L (69%)  IMAGING CT CHEST W/O 09/14/16 (personally reviewed by me): Mild compression deformity of upper endplate T10 & R15 vertebral bodies.  Moderate compression deformity L1 vertebral body.  prior vertebroplasty L2 & L3. No parenchymal opacity appreciated. Compressive atelectasis from small  bilateral pleural effusions. Subcarinal lymph node measuring approximately 1.2 cm short axis by my review.  CARDIAC TTE (09/12/16): Moderate LVH with EF 60-65%. Unable to assess diastolic function due to atrial fibrillation. No regional wall motion abnormalities. LA & RA severely dilated. RV moderately dilated with moderate to severe reduction in systolic function. No aortic stenosis or regurgitation. Aortic root normal in size. Mild mitral regurgitation without stenosis. No pulmonic stenosis or regurgitation. Moderate tricuspid regurgitation without stenosis. Large left pleural effusion. Trivial circumferential pericardial effusion.  LABS 09/11/16 BNP:  723 (previously 364 on 07/26/16) Troponin I:  <0.03    Assessment & Plan:  83 y.o. female with admission in February with acute congestive heart failure likely due to diastolic dysfunction with her moderate left ventricular hypertrophy. Patient's right ventricular dysfunction is likely multifactorial but certainly do not only to her left ventricular dysfunction but also likely due to her underlying COPD. I do question the severity of her COPD with her previous spirometry only showing mild airway obstruction. With her significant symptomatic response with her Combivent inhaler I believe she would benefit from a long-acting bronchodilator. I did caution the patient against potential adverse effects, specifically with reference to her underlying glaucoma, with an anticholinergic therapy. The patient's son was also present for my recommendations not only with regards to her COPD but also her underlying dietary and fluid restrictions with her congestive heart failure. I instructed the patient and her son to contact me if they have any further questions or concerns before her next appointment.  1. Mild COPD: Screening for alpha-1 antitrypsin deficiency. Checking full pulmonary function testing before next appointment. Trying patient on Anoro inhaler in place of  Combivent. She will contact me for prescription if this is effective. She will also need a new rescue inhaler if this is the case. 2. Chronic hypoxic respiratory failure: Multifactorial. Checking 6 minute walk test with oxygen titration prior to next appointment. Continuing on oxygen as previously prescribed. 3. Chronic diastolic congestive heart failure: Patient currently on diuretic therapy. Educated on fluid and sodium restriction. 4. Bilateral pleural effusions: Likely secondary to decompensated congestive heart failure. Checking chest x-ray PA/LAT today as well as decubitus films. May benefit from thoracentesis depending upon findings. 5. Health maintenance: Status post influenza vaccine September 2017, Prevnar October 2015, Pneumovax January 2000, &  Tdap May 2015. 6. Follow-up: Patient to return to clinic in 6 weeks.   [name] Ashok Cordia, M.D. [facility] Pager:  [phone] After 3pm or if no response, call [phone] 5:12 PM 10/13/16

## 2016-10-14 ENCOUNTER — Ambulatory Visit (INDEPENDENT_AMBULATORY_CARE_PROVIDER_SITE_OTHER): Payer: Medicare Other | Admitting: Family Medicine

## 2016-10-14 ENCOUNTER — Other Ambulatory Visit: Payer: Self-pay | Admitting: *Deleted

## 2016-10-14 ENCOUNTER — Encounter: Payer: Self-pay | Admitting: *Deleted

## 2016-10-14 ENCOUNTER — Encounter: Payer: Self-pay | Admitting: Family Medicine

## 2016-10-14 ENCOUNTER — Telehealth: Payer: Self-pay | Admitting: *Deleted

## 2016-10-14 VITALS — BP 144/80 | HR 88 | Temp 97.3°F | Wt 120.0 lb

## 2016-10-14 DIAGNOSIS — Z9981 Dependence on supplemental oxygen: Secondary | ICD-10-CM

## 2016-10-14 DIAGNOSIS — M8000XD Age-related osteoporosis with current pathological fracture, unspecified site, subsequent encounter for fracture with routine healing: Secondary | ICD-10-CM | POA: Diagnosis not present

## 2016-10-14 DIAGNOSIS — J449 Chronic obstructive pulmonary disease, unspecified: Secondary | ICD-10-CM | POA: Diagnosis not present

## 2016-10-14 DIAGNOSIS — I50810 Right heart failure, unspecified: Secondary | ICD-10-CM | POA: Diagnosis not present

## 2016-10-14 DIAGNOSIS — J9611 Chronic respiratory failure with hypoxia: Secondary | ICD-10-CM

## 2016-10-14 DIAGNOSIS — I10 Essential (primary) hypertension: Secondary | ICD-10-CM | POA: Diagnosis not present

## 2016-10-14 DIAGNOSIS — F418 Other specified anxiety disorders: Secondary | ICD-10-CM | POA: Diagnosis not present

## 2016-10-14 DIAGNOSIS — I5032 Chronic diastolic (congestive) heart failure: Secondary | ICD-10-CM

## 2016-10-14 DIAGNOSIS — R413 Other amnesia: Secondary | ICD-10-CM | POA: Diagnosis not present

## 2016-10-14 DIAGNOSIS — J90 Pleural effusion, not elsewhere classified: Secondary | ICD-10-CM | POA: Diagnosis not present

## 2016-10-14 LAB — BASIC METABOLIC PANEL
BUN: 13 mg/dL (ref 6–23)
CALCIUM: 9.6 mg/dL (ref 8.4–10.5)
CO2: 32 mEq/L (ref 19–32)
Chloride: 102 mEq/L (ref 96–112)
Creatinine, Ser: 0.66 mg/dL (ref 0.40–1.20)
GFR: 89.6 mL/min (ref >60.00)
GLUCOSE: 119 mg/dL — AB (ref 70–99)
Potassium: 3.6 mEq/L (ref 3.5–5.1)
SODIUM: 141 meq/L (ref 135–145)

## 2016-10-14 MED ORDER — POTASSIUM CHLORIDE CRYS ER 20 MEQ PO TBCR: 20.0000 meq | EXTENDED_RELEASE_TABLET | Freq: Every day | ORAL | 3 refills | Status: DC

## 2016-10-14 NOTE — Assessment & Plan Note (Signed)
Seems stable on zoloft 100mg  daily and rare PRN lorazepam.

## 2016-10-14 NOTE — Patient Instructions (Addendum)
Goal weight likely 115 lbs at this time. Keep weighing daily Continue lasix 40mg  daily (may take 2 20mg  tabs once daily).  Continue potassium 32mEq daily.  We will see how anoro helps with breathing.  BMP and lab from Dr Ashok Cordia checked today.  We will order boniva at short stay.  Return to see me in 2 months, sooner if needed.

## 2016-10-14 NOTE — Assessment & Plan Note (Addendum)
Relatively new diagnosis with ongoing dyspnea at rest worse with exertion. Anticipate COPD and CHF related. Continue home oxygen.

## 2016-10-14 NOTE — Telephone Encounter (Signed)
Boniva order for patient in your IN box for review/completion.

## 2016-10-14 NOTE — Patient Outreach (Signed)
[facility]) Care Management   10/14/2016  [name] [birth year] [id number]  [name] is an 83 y.o. female with medical history significant of atrial fibrillation, CHF, HTN, hypothyroidism, DM Type II, GERD, and nonspecific lung disease with worsening shortness of breath for 1 week prior to her inpatient Admission on 09/11/16. She was discharged to home with home health RN and PT visits.  Valerie Montgomerywas referred to [facility] for CHF Disease Management and care coordination and level of care concerns.  THN CM is  seeing her at home today for her initial home visit.    Subjective:  "I just came from the doctor today" I feel fair today"  Objective:  BP 122/74 (BP Location: Left Arm, Patient Position: Sitting, Cuff Size: Normal)   Pulse 84   Wt 118 lb 9.6 oz (53.8 kg)   SpO2 95%   BMI 21.69 kg/m   Review of Systems  Constitutional: Negative.  Negative for chills, diaphoresis, fever, malaise/fatigue and weight loss.  HENT: Negative.  Negative for congestion, ear discharge, ear pain, hearing loss, nosebleeds, sinus pain, sore throat and tinnitus.   Eyes: Negative.  Negative for blurred vision, double vision, photophobia, pain, discharge and redness.  Respiratory: Positive for shortness of breath. Negative for cough, hemoptysis, sputum production, wheezing and stridor.        Some sob at rest - continues 2 L O2 [place] seen by pcp 10/14/16  Cardiovascular: Positive for chest pain and leg swelling. Negative for palpitations, orthopnea, claudication and PND.       Sharp pain with breathing once   Gastrointestinal: Positive for constipation. Negative for abdominal pain, blood in stool, diarrhea, heartburn, melena, nausea and vomiting.  Genitourinary: Positive for urgency. Negative for dysuria, flank pain, frequency and hematuria.       States urgency to void after CM checked for distension of abdomen-denies pain only need to void- went to bedside  commode with relief  Musculoskeletal: Positive for back pain. Negative for falls, joint pain, myalgias and neck pain.       Mild constant stabbing at intervals pmh cement procedure   Skin: Negative.  Negative for itching and rash.  Neurological: Negative.  Negative for dizziness, tingling, tremors, sensory change, speech change, focal weakness, seizures, loss of consciousness, weakness and headaches.       Both hands tremors  Endo/Heme/Allergies: Negative for environmental allergies and polydipsia. Bruises/bleeds easily.       Easy to bruise related to coumadin use but no new bruises reported or noted  Psychiatric/Behavioral: Negative.  Negative for depression, hallucinations, memory loss, substance abuse and suicidal ideas. The patient is not nervous/anxious and does not have insomnia.     Physical Exam  Constitutional: She is oriented to person, place, and time. She appears well-nourished. No distress.  HENT:  Head: Atraumatic.  Right Ear: External ear normal.  Left Ear: External ear normal.  Nose: Nose normal.  Eyes: Right eye exhibits no discharge. Left eye exhibits no discharge.  Neck: No tracheal deviation present. No thyromegaly present.  Cardiovascular: Normal rate, normal heart sounds and intact distal pulses.   Respiratory: Effort normal and breath sounds normal. No stridor. No respiratory distress. She has no wheezes.  GI: She exhibits distension. There is no tenderness. There is no guarding.  Abdomen tight on left side  Musculoskeletal: She exhibits edema. She exhibits no tenderness or deformity.  mlld 1 + edema of left ankle on lasix   Lymphadenopathy:    She  has no cervical adenopathy.  Neurological: She is alert and oriented to person, place, and time.  Skin: Skin is warm and dry.  Psychiatric: She has a normal mood and affect. Her behavior is normal.    Encounter Medications:   Outpatient Encounter Prescriptions as of 10/14/2016  Medication Sig Note  . acetaminophen  (TYLENOL) 500 MG tablet Take 2 tablets (1,000 mg total) by mouth 3 (three) times daily. 09/30/2016: Now prn only  . Blood Glucose Monitoring Suppl (FREESTYLE LITE) DEVI Check blood sugar once daily and as directed. Dx E11.9   . Calcium-Magnesium-Vitamin D [phone] MG-MG-UNIT TABS Take 1 tablet by mouth 2 (two) times daily.   Marland Kitchen donepezil (ARICEPT) 5 MG tablet Take 1 tablet (5 mg total) by mouth at bedtime.   . feeding supplement, GLUCERNA SHAKE, (GLUCERNA SHAKE) LIQD Take 237 mLs by mouth 2 (two) times daily between meals.   . felodipine (PLENDIL) 2.5 MG 24 hr tablet TAKE 1 TABLET DAILY   . ferrous sulfate 325 (65 FE) MG tablet Take 325 mg by mouth daily with breakfast.   . furosemide (LASIX) 20 MG tablet Take 2 tablets (40 mg total) by mouth daily.   Marland Kitchen glucose blood (FREESTYLE LITE) test strip Check blood sugar once daily and as directed. Dx E11.9   . Lancets (FREESTYLE) lancets Check blood sugar once daily and as directed. Dx E11.9   . latanoprost (XALATAN) 0.005 % ophthalmic solution Place 1 drop into both eyes at bedtime.    Marland Kitchen levothyroxine (SYNTHROID, LEVOTHROID) 50 MCG tablet TAKE 1 TABLET DAILY   . lidocaine (LIDODERM) 5 % Place 1 patch onto the skin daily. Remove & Discard patch within 12 hours or as directed by MD (Patient taking differently: Place 1 patch onto the skin daily as needed. Remove & Discard patch within 12 hours or as directed by MD)   . LORazepam (ATIVAN) 0.5 MG tablet Take 1 tablet (0.5 mg total) by mouth every 8 (eight) hours as needed for anxiety. 09/30/2016: Now to once a week for anxiety  . Misc Natural Products (FIBER 7) POWD Take by mouth daily.   . Multiple Vitamin (MULTIVITAMIN) capsule Take 1 capsule by mouth every morning.    . Omega-3 Fatty Acids (FISH OIL CONCENTRATE) 1000 MG CAPS Take 2 capsules by mouth 2 (two) times daily.     . potassium chloride SA (K-DUR,KLOR-CON) 20 MEQ tablet Take 1 tablet (20 mEq total) by mouth daily.   . sertraline (ZOLOFT) 100 MG  tablet Take 1 tablet (100 mg total) by mouth daily.   Marland Kitchen umeclidinium-vilanterol (ANORO ELLIPTA) 62.5-25 MCG/INH AEPB Inhale 1 puff into the lungs daily.   . vitamin B-12 (CYANOCOBALAMIN) 500 MCG tablet Take 500 mcg by mouth daily.   Marland Kitchen warfarin (COUMADIN) 2.5 MG tablet Take 1 tablet daily except 1 1/2 tablets on Mondays and Thursdays (Patient taking differently: Take 3.75 mg by mouth every evening. ) 09/30/2016: 09/30/16 doses depends on lab values   . Ipratropium-Albuterol (COMBIVENT RESPIMAT) 20-100 MCG/ACT AERS respimat Inhale 1 puff into the lungs every 6 (six) hours. (Patient not taking: Reported on 10/14/2016) 09/30/2016: Taking tid no hs dose rountine basis   No facility-administered encounter medications on file as of 10/14/2016.      Assessment:    Level of Care Concerns -  Valerie. Toste continues to do well living at home with  her husband in [place] and prefers to continue to remain in her home with home health services. THN CM was able to meet her  husband today.  [name], neighbor, (present today for our visit) continues to Valerie. Smoker three times daily as per an arrangement made with the family.  [name] continues to help Valerie. Settles to weigh daily and record. [name] is able to verbalize the need to report a weight gain or worsening of symptoms listed in the COPD/CHF zones listed in [place].  [name] provides assistance with medication management and is knowledge about Valerie. Horst's medications, prescribed treatment plan, and dietary management needs.  Valerie Kocher voices disinterest in eating with a fair appetite  She and [name] report the patient has recently request certain foods she likes and has request salt not to be added (french fries).  She continues to be followed by [facility] home health RN, [name]. PT services will be concluding soon but pt noted on this visit to need only stand by assistance as she got out of her chair and went to the bedside commode near her within 1  minute using her walker.    Chronic Health Condition (CHF, COPD) - Contact was made to Dr Danise Mina office. THN CM spoke with Morey Hummingbird who confirms Dr Damita Dunnings prefers that Valerie Covino remain a patient of Dr Danise Mina.  Pt and her son have been informed by the pcp office.  During the home visit [facility] CM discussed this and the pt is now okay with providers and treatment plan. She no longer wants to switch primary provider from Dr Danise Mina to Dr Damita Dunnings whom she recently saw. Dr Rene Kocher was seen on 10/13/16 at 1600; she is on continuous O2 at 2 L/[place] and he would like to have further evaluation of how much lung disease is contributing to her shortness of breath.  She scheduled to see CV staff, Bunnie Domino on 10/16/16. She is scheduled to see her podiatrist, Gardiner Barefoot on 12/02/16 at 1400.  She saw her dentist on "last week and is to see him tomorrow for an adjustment". [name] and Valerie Booze were encouraged to make and appointment with Dr Katy Fitch to assist with getting a hearing aid.   Valerie Montijo did not voice concerns with depression while [facility] CM visited today and was noted to be smiling and more responsive during the assessment. She continues to be scheduled to be seen on 10/26/16 by Caroline Sauger, [place], at [facility] for her first evaluation for counseling. All of Valerie Carvell appointments are managed by [name] with the [facility] calendar provided during the last visit.   THN CM also discussed the listed upcoming [facility] SW appointment after [name] inquired about it.  THN CM discussed THN SW consult was initially related to possible level of care concerns but today Valerie Lynn and Lynelle Smoke confirms that pt preference for level of care is to remain at home with home health services and assistance from [name] 3 days a week.  [name] wanted CM to inform [facility] SW to be very specific during call or visit and about patient's level of care preference.  Pt was provided with Glucerna discount coupons.  THN  CM Care Plan Problem One     Most Recent Value  Care Plan Problem One  (P) Level of Care Concerns and Care Coordination Needs  Role Documenting the Problem One  (P) Care Management [place] for Problem One  (P) Active  THN Long Term Goal (31-90 days)  (P) Over the next 60 days, patient and family will verbalize understanding of patient care needs and options for assitance in the home and level  of care needs and community resources  [facility] Long Term Goal Start Date  (P) 09/25/16  Interventions for Problem One Long Term Goal  (P) Collaboration with patient/family/LCSW re: care needs and community resources  [place] CM Short Term Goal #1 (0-30 days)  (P) Over the next 7 days, patient/family will meet with critical risk nurse case manager re: current health conditions, educational needs, and level of care concerns  THN CM Short Term Goal #1 Start Date  (P) 09/25/16  Interventions for Short Term Goal #1  (P) scheduled home visit with patient/family after discussion about provider referral and care concerns    [facility] CM Care Plan Problem Two     Most Recent Value  Care Plan Problem Two  (P) Knowledge Deficits related to Chronic Health Condition (CHF) as evidenced by patient and caregiver questions about plan of care  Role Documenting the Problem Two  (P) Care Management Coordinator  Care Plan for Problem Two  (P) Active  Interventions for Problem Two Long Term Goal   (P) Discussed need for additional knowledge related to plan of care for management of CHF,  scheduled home visit with patient/spouse/caregiver  THN Long Term Goal (31-90) days  (P) Over the next 60 days, patient/family/caregiver will verbalize understanding of plan of care for management of CHF  THN Long Term Goal Start Date  (P) 09/28/16  THN CM Short Term Goal #1 (0-30 days)  (P) Over the next 30 days, patient will weigh daily adn record and verbalize understanding of when to call for weight gain and rationale for doing so  THN CM  Short Term Goal #1 Start Date  (P) 09/28/16  Interventions for Short Term Goal #2   (P) Discussed with patient and caregiver rationale and necessity of daily weight, recording, and reporting weight gain,  Emmi educational materials prescribed  THN CM Short Term Goal #2 (0-30 days)  (P) Over the next 30 days, patient/spouse/caregiver will verbalize understanding of signs and symptoms of CHF and when to report  THN CM Short Term Goal #2 Start Date  (P) 09/28/16  Interventions for Short Term Goal #2  (P) Discussed signs and symptoms of early heart failure and when to report,  Emmi educational materials prescribed  THN CM Short Term Goal #3 (0-30 days)  (P) Over the next 30 days, patient will attend provider appointment at scheduled  [facility] CM Short Term Goal #3 Start Date  (P) 09/28/16  Interventions for Short Term Goal #3  (P) reviewed upcoming pulmonary provider appointment w/ patient/caregiver       Plan:  I will contact Valerie Latendresse in next week and see Valerie Handler in 2 weeks for a home visit

## 2016-10-14 NOTE — Progress Notes (Signed)
Pre visit review using our clinic review tool, if applicable. No additional management support is needed unless otherwise documented below in the visit note. 

## 2016-10-14 NOTE — Assessment & Plan Note (Addendum)
Calcitonin nasal spray stopped during recent hospitalization. Pt desires to restart boniva (on hold since early 2017 due to dental work). Will order. h/o compression fractures. I don't see where boniva infusion was done late last year as ordered.

## 2016-10-14 NOTE — Addendum Note (Signed)
Addended by: Ellamae Sia on: 10/14/2016 10:57 AM   Modules accepted: Orders

## 2016-10-14 NOTE — Telephone Encounter (Signed)
Order faxed and patient's caregiver notified. She will call and schedule appt.

## 2016-10-14 NOTE — Assessment & Plan Note (Signed)
Weight improving, pedal edema improving on new lasix dose of 40mg  daily. Pt seems to be tolerating well, along with daily 19mEq Kdur. Dyspnea persists. Check BMP today.

## 2016-10-14 NOTE — Assessment & Plan Note (Addendum)
Weight improving, pedal edema improving on new lasix dose of 40mg  daily. Pt seems to be tolerating well, along with daily 34mEq Kdur. Dyspnea persists. Check BMP today.

## 2016-10-14 NOTE — Assessment & Plan Note (Signed)
Thought CHF related.

## 2016-10-14 NOTE — Assessment & Plan Note (Addendum)
Now has caregiver with her three times a week.  Continue aricept 5mg  daily.

## 2016-10-14 NOTE — Progress Notes (Signed)
Left message to call office for medical results.

## 2016-10-14 NOTE — Assessment & Plan Note (Addendum)
Appreciate pulm care - pending 6 min walk and rpt PFTs. Now on anoro sample trial.

## 2016-10-14 NOTE — Assessment & Plan Note (Signed)
Chronic, mildly elevated but adequate. No changes today.

## 2016-10-14 NOTE — Progress Notes (Signed)
BP (!) 144/80   Pulse 88   Temp 97.3 F (36.3 C) (Oral)   Wt 120 lb (54.4 kg)   SpO2 90% Comment: 2LPM O2  BMI 21.95 kg/m    CC: 27mo f/u visit Subjective:    Patient ID: [name], female    DOB: [birth year], 82 y.o.   MRN: [id number]  HPI: [name] is a 83 y.o. female presenting on 10/14/2016 for Follow-up   Here with [name] caregiver who comes out to house 3 days/wk. Home weights 117, 118, 121, 118 lbs.   Saw Dr Ashok Cordia pulm yesterday, note reviewed - started on long acting bronchodilator. Planned full PFTs prior to next appt in 6 wks. Placed on anoro in place of combivent.   Currently on lasix 40mg  daily. Prior 20mg  dosing led to weight gain and pedal edema and dyspnea. She is taking 76mEq potassium daily. Mild pedal edema, improved. Chronic dyspnea some at rest, worse with exertion, stable despite 2L O2 by [place].   Recent hospitalization 09/11/2016 for CHF exacerbation in setting of COPD with noted new oxygen requirement.   Referred to social work to see what further assistance was needed at home. Ongoing memory trouble and depression.   She continue staking 2 glucerna daily. Working with HHPT.  Rare lorazepam use for anxiety - last filled 07/17/2016 #60 RF0.   Osteoporosis - due for boniva. Has completed dental work. Nasal calcitonin was stopped during latest hospitalization.   Relevant past medical, surgical, family and social history reviewed and updated as indicated. Interim medical history since our last visit reviewed. Allergies and medications reviewed and updated. Outpatient Medications Prior to Visit  Medication Sig Dispense Refill  . acetaminophen (TYLENOL) 500 MG tablet Take 2 tablets (1,000 mg total) by mouth 3 (three) times daily.    . Blood Glucose Monitoring Suppl (FREESTYLE LITE) DEVI Check blood sugar once daily and as directed. Dx E11.9 90 each 1  . Calcium-Magnesium-Vitamin D [phone] MG-MG-UNIT TABS Take 1 tablet by mouth 2 (two) times  daily.    Marland Kitchen donepezil (ARICEPT) 5 MG tablet Take 1 tablet (5 mg total) by mouth at bedtime. 30 tablet 3  . feeding supplement, GLUCERNA SHAKE, (GLUCERNA SHAKE) LIQD Take 237 mLs by mouth 2 (two) times daily between meals. 14220 mL 0  . felodipine (PLENDIL) 2.5 MG 24 hr tablet TAKE 1 TABLET DAILY 90 tablet 1  . ferrous sulfate 325 (65 FE) MG tablet Take 325 mg by mouth daily with breakfast.    . furosemide (LASIX) 20 MG tablet Take 2 tablets (40 mg total) by mouth daily.    Marland Kitchen glucose blood (FREESTYLE LITE) test strip Check blood sugar once daily and as directed. Dx E11.9 100 each 1  . Ipratropium-Albuterol (COMBIVENT RESPIMAT) 20-100 MCG/ACT AERS respimat Inhale 1 puff into the lungs every 6 (six) hours. 4 g 1  . Lancets (FREESTYLE) lancets Check blood sugar once daily and as directed. Dx E11.9 100 each 1  . latanoprost (XALATAN) 0.005 % ophthalmic solution Place 1 drop into both eyes at bedtime.     Marland Kitchen levothyroxine (SYNTHROID, LEVOTHROID) 50 MCG tablet TAKE 1 TABLET DAILY 90 tablet 3  . lidocaine (LIDODERM) 5 % Place 1 patch onto the skin daily. Remove & Discard patch within 12 hours or as directed by MD (Patient taking differently: Place 1 patch onto the skin daily as needed. Remove & Discard patch within 12 hours or as directed by MD) 30 patch 6  . LORazepam (ATIVAN) 0.5 MG  tablet Take 1 tablet (0.5 mg total) by mouth every 8 (eight) hours as needed for anxiety. 60 tablet 0  . Misc Natural Products (FIBER 7) POWD Take by mouth daily.    . Multiple Vitamin (MULTIVITAMIN) capsule Take 1 capsule by mouth every morning.     . Omega-3 Fatty Acids (FISH OIL CONCENTRATE) 1000 MG CAPS Take 2 capsules by mouth 2 (two) times daily.      . sertraline (ZOLOFT) 100 MG tablet Take 1 tablet (100 mg total) by mouth daily. 90 tablet 1  . umeclidinium-vilanterol (ANORO ELLIPTA) 62.5-25 MCG/INH AEPB Inhale 1 puff into the lungs daily. 2 each 0  . vitamin B-12 (CYANOCOBALAMIN) 500 MCG tablet Take 500 mcg by mouth  daily.    Marland Kitchen warfarin (COUMADIN) 2.5 MG tablet Take 1 tablet daily except 1 1/2 tablets on Mondays and Thursdays (Patient taking differently: Take 3.75 mg by mouth every evening. ) 180 tablet 3  . potassium chloride SA (K-DUR,KLOR-CON) 20 MEQ tablet Take 1 tablet (20 mEq total) by mouth daily as needed (with lasix). 30 tablet 1   No facility-administered medications prior to visit.      Per HPI unless specifically indicated in ROS section below Review of Systems     Objective:    BP (!) 144/80   Pulse 88   Temp 97.3 F (36.3 C) (Oral)   Wt 120 lb (54.4 kg)   SpO2 90% Comment: 2LPM O2  BMI 21.95 kg/m   Wt Readings from Last 3 Encounters:  10/14/16 120 lb (54.4 kg)  10/13/16 119 lb 6.4 oz (54.2 kg)  09/30/16 114 lb 8 oz (51.9 kg)    Physical Exam  Constitutional: She appears well-developed and well-nourished. No distress.  HENT:  Mouth/Throat: Oropharynx is clear and moist. No oropharyngeal exudate.  Eyes: Conjunctivae and EOM are normal. Pupils are equal, round, and reactive to light.  Cardiovascular: Normal rate, normal heart sounds and intact distal pulses.  An irregular rhythm present.  No murmur heard. Pulmonary/Chest: Effort normal and breath sounds normal. No respiratory distress. She has no wheezes. She has no rales.  Bibasilar crackles  Musculoskeletal: She exhibits edema (1+ L>R pedal edema).  Skin: Skin is warm and dry. No rash noted.  Nursing note and vitals reviewed.  Results for orders placed or performed in visit on 10/05/16  POCT INR  Result Value Ref Range   INR 1.8    Lab Results  Component Value Date   CREATININE 0.59 09/22/2016    Lab Results  Component Value Date   TSH 2.222 09/11/2016       Assessment & Plan:   Problem List Items Addressed This Visit    Bilateral pleural effusion    Thought CHF related.       Chronic diastolic CHF (congestive heart failure) (HCC)    Weight improving, pedal edema improving on new lasix dose of 40mg  daily.  Pt seems to be tolerating well, along with daily 32mEq Kdur. Dyspnea persists. Check BMP today.       Relevant Orders   Basic metabolic panel   Chronic respiratory failure with hypoxia, on home oxygen therapy ([place]) - Primary    Relatively new diagnosis with ongoing dyspnea at rest worse with exertion. Anticipate COPD and CHF related. Continue home oxygen.       COPD, mild (HCC)    Appreciate pulm care - pending 6 min walk and rpt PFTs. Now on anoro sample trial.       Depression with anxiety  Seems stable on zoloft 100mg  daily and rare PRN lorazepam.       Essential hypertension, benign    Chronic, mildly elevated but adequate. No changes today.       Memory deficit    Now has caregiver with her three times a week.  Continue aricept 5mg  daily.       Osteoporosis    Calcitonin nasal spray stopped during recent hospitalization. Pt desires to restart boniva (on hold since early 2017 due to dental work). Will order. h/o compression fractures. I don't see where boniva infusion was done late last year as ordered.       Right heart failure, NYHA class 3    Weight improving, pedal edema improving on new lasix dose of 40mg  daily. Pt seems to be tolerating well, along with daily 55mEq Kdur. Dyspnea persists. Check BMP today.           Follow up plan: Return in about 2 months (around 12/14/2016) for follow up visit.  Ria Bush, MD

## 2016-10-14 NOTE — Telephone Encounter (Signed)
Signed and in Kim's box. Thanks.

## 2016-10-15 ENCOUNTER — Telehealth: Payer: Self-pay | Admitting: *Deleted

## 2016-10-15 ENCOUNTER — Telehealth: Payer: Self-pay | Admitting: Pulmonary Disease

## 2016-10-15 DIAGNOSIS — I130 Hypertensive heart and chronic kidney disease with heart failure and stage 1 through stage 4 chronic kidney disease, or unspecified chronic kidney disease: Secondary | ICD-10-CM | POA: Diagnosis not present

## 2016-10-15 DIAGNOSIS — I509 Heart failure, unspecified: Secondary | ICD-10-CM | POA: Diagnosis not present

## 2016-10-15 NOTE — Progress Notes (Signed)
Please see phone note from 3.29.18. Will sign off.

## 2016-10-15 NOTE — Telephone Encounter (Signed)
[name] from [facility] called with patient's weight today 116.4 lbs. Has taken all meds as directed. Call back if needed [phone]

## 2016-10-15 NOTE — Telephone Encounter (Signed)
Noted. Thanks.  No changes. Continue lasix 40mg 

## 2016-10-15 NOTE — Telephone Encounter (Signed)
Notes recorded by Javier Glazier, MD on 10/14/2016 at 9:19 AM EDT Reviewed the patient's x-ray. She has very small pleural effusions bilaterally and nothing that needs to be drained. Please let the patient know. Thank you.   Called and spoke to [place], pt's caregiver. [name] is requesting the results of the CXR, she is not on pt's DPR. Advised her we cannot give out pt's information. [name] states the pt's son, Truman Hayward, is on her DPR. Called Lee and informed him of the results and recs per JN. He verbalized understanding and denied any further questions or concerns at this time.

## 2016-10-16 ENCOUNTER — Encounter: Payer: Self-pay | Admitting: Cardiology

## 2016-10-16 ENCOUNTER — Other Ambulatory Visit: Payer: Self-pay | Admitting: *Deleted

## 2016-10-16 ENCOUNTER — Ambulatory Visit (INDEPENDENT_AMBULATORY_CARE_PROVIDER_SITE_OTHER): Payer: Medicare Other | Admitting: Cardiology

## 2016-10-16 DIAGNOSIS — I481 Persistent atrial fibrillation: Secondary | ICD-10-CM | POA: Diagnosis not present

## 2016-10-16 DIAGNOSIS — I4819 Other persistent atrial fibrillation: Secondary | ICD-10-CM

## 2016-10-16 DIAGNOSIS — J984 Other disorders of lung: Secondary | ICD-10-CM

## 2016-10-16 DIAGNOSIS — J449 Chronic obstructive pulmonary disease, unspecified: Secondary | ICD-10-CM

## 2016-10-16 DIAGNOSIS — Z7901 Long term (current) use of anticoagulants: Secondary | ICD-10-CM | POA: Insufficient documentation

## 2016-10-16 NOTE — Assessment & Plan Note (Signed)
Rate appropriate for her condition

## 2016-10-16 NOTE — Progress Notes (Signed)
10/16/2016 RALPH BENAVIDEZ   1925/11/07  [id number]  Primary Physician Ria Bush, MD Primary Cardiologist: Dr Domenic Polite  HPI:  83 y/o female with CAF, on Coumadin, with severe COPD and pulmonary HTN. Her last echo in Feb 2018 showed an EF of 60-65% with LVH, severe bi atrial enlargement, and PA pressure of 86 mmHg. She was admitted then with acute on chronic Rt heart failure. She is in the office today for follow up. She uses a rolling walking, no falls. She appears to be quite frail but currently stable.   Current Outpatient Prescriptions  Medication Sig Dispense Refill  . acetaminophen (TYLENOL) 500 MG tablet Take 2 tablets (1,000 mg total) by mouth 3 (three) times daily.    . Blood Glucose Monitoring Suppl (FREESTYLE LITE) DEVI Check blood sugar once daily and as directed. Dx E11.9 90 each 1  . Calcium-Magnesium-Vitamin D 275-170-017 MG-MG-UNIT TABS Take 1 tablet by mouth 2 (two) times daily.    Marland Kitchen donepezil (ARICEPT) 5 MG tablet Take 1 tablet (5 mg total) by mouth at bedtime. 30 tablet 3  . felodipine (PLENDIL) 2.5 MG 24 hr tablet TAKE 1 TABLET DAILY 90 tablet 1  . ferrous sulfate 325 (65 FE) MG tablet Take 325 mg by mouth daily with breakfast.    . furosemide (LASIX) 20 MG tablet Take 2 tablets (40 mg total) by mouth daily.    Marland Kitchen glucose blood (FREESTYLE LITE) test strip Check blood sugar once daily and as directed. Dx E11.9 100 each 1  . Lancets (FREESTYLE) lancets Check blood sugar once daily and as directed. Dx E11.9 100 each 1  . latanoprost (XALATAN) 0.005 % ophthalmic solution Place 1 drop into both eyes at bedtime.     Marland Kitchen levothyroxine (SYNTHROID, LEVOTHROID) 50 MCG tablet TAKE 1 TABLET DAILY 90 tablet 3  . lidocaine (LIDODERM) 5 % Place 1 patch onto the skin daily. Remove & Discard patch within 12 hours or as directed by MD (Patient taking differently: Place 1 patch onto the skin daily as needed. Remove & Discard patch within 12 hours or as directed by MD) 30 patch 6  .  LORazepam (ATIVAN) 0.5 MG tablet Take 1 tablet (0.5 mg total) by mouth every 8 (eight) hours as needed for anxiety. 60 tablet 0  . Misc Natural Products (FIBER 7) POWD Take by mouth daily.    . Multiple Vitamin (MULTIVITAMIN) capsule Take 1 capsule by mouth every morning.     . Omega-3 Fatty Acids (FISH OIL CONCENTRATE) 1000 MG CAPS Take 2 capsules by mouth 2 (two) times daily.      . potassium chloride SA (K-DUR,KLOR-CON) 20 MEQ tablet Take 1 tablet (20 mEq total) by mouth daily. 30 tablet 3  . sertraline (ZOLOFT) 100 MG tablet Take 1 tablet (100 mg total) by mouth daily. 90 tablet 1  . umeclidinium-vilanterol (ANORO ELLIPTA) 62.5-25 MCG/INH AEPB Inhale 1 puff into the lungs daily. 2 each 0  . vitamin B-12 (CYANOCOBALAMIN) 500 MCG tablet Take 500 mcg by mouth daily.    Marland Kitchen warfarin (COUMADIN) 2.5 MG tablet Take 1 tablet daily except 1 1/2 tablets on Mondays and Thursdays (Patient taking differently: Take 3.75 mg by mouth every evening. ) 180 tablet 3   No current facility-administered medications for this visit.     Allergies  Allergen Reactions  . Propoxyphene N-Acetaminophen Shortness Of Breath  . Codeine Nausea And Vomiting  . Meperidine Hcl Nausea And Vomiting  . Morphine Nausea And Vomiting  . Shellfish Allergy  Hives  . Tramadol Nausea Only    Past Medical History:  Diagnosis Date  . Anemia   . Anxiety   . Aortic sclerosis   . Arthritis   . CHF (congestive heart failure) ([place])   . Chronic atrial fibrillation ([place])   . Chronic gastritis 10/2009   Duodenitis on EGD  . CKD (chronic kidney disease) stage 2, GFR 60-89 ml/min   . Collagen vascular disease ([place])   . COPD (chronic obstructive pulmonary disease) ([place])   . Depression   . Diabetes type 2, controlled ([place])    Diet controlled  . Diverticulosis   . Dry ARMD    Retinal hemorrhage (09/2013) [place]  . Essential hypertension, benign   . GERD (gastroesophageal reflux disease)   . Glaucoma    Dr. Venetia Maxon  . Hiatal hernia  2011   Small  . History of pelvic fracture April 2013  . History of rheumatic fever   . History of skin cancer   . History of TIA (transient ischemic attack)   . Hypothyroidism   . Macular degeneration   . Mixed hyperlipidemia   . Osteoporosis 04/2013   Thoracic compression fracture, on bisphosphonate and cal/vit D  . Oxygen deficiency   . Pulmonary nodules    Stable bilateral on CT 01/2010, rec rpt yearly for 2 yrs, pt decided to stop f/u  . Seasonal allergies   . Vertebral compression fracture ([place]) 05/2016   Vertebroplasty L2/L3    Social History   Social History  . Marital status: Married    Spouse name: N/A  . Number of children: N/A  . Years of education: N/A   Occupational History  . Retired     Former Therapist, sports   Social History Main Topics  . Smoking status: Former Smoker    Packs/day: 1.00    Years: 30.00    Types: Cigarettes    Quit date: 10/13/1998  . Smokeless tobacco: Never Used  . Alcohol use No  . Drug use: No  . Sexual activity: No   Other Topics Concern  . 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      [facility] (10/13/16):   Originally from [place]. Moved to the [place] at 83 y.o. She has lived in [place] and moved to [place] in 1963. Retired Therapist, sports. No known TB exposure. Has dogs currently. No bird or mold exposure.         Family History  Problem Relation Age of Onset  . Coronary artery disease Mother 28    MI deceased  . Colon cancer Father 73  . Asthma Father   . Asthma Sister   . Stroke Paternal Grandmother   . Diabetes Neg Hx      Review of Systems: General: negative for chills, fever, night sweats or weight changes.  Cardiovascular: negative for chest pain, edema,  orthopnea, palpitations, paroxysmal nocturnal dyspnea  Dermatological: negative for rash Respiratory: negative for cough or wheezing Urologic: negative for hematuria Abdominal: negative for nausea, vomiting, diarrhea, bright red blood per rectum, melena, or hematemesis Neurologic: negative for visual changes, syncope, or dizziness All other systems reviewed and are otherwise negative except  as noted above.    Blood pressure (!) 116/58, pulse 79, height 5\' 2"  (1.575 m), weight 117 lb (53.1 kg), SpO2 90 %.  General appearance: alert, cooperative, cachectic, no distress and on O2 Neck: no carotid bruit and no JVD Lungs: decreased breath sounds Heart: irregularly irregular rhythm Extremities: extremities normal, atraumatic, no cyanosis or edema Skin: Skin color, texture, turgor normal. No rashes or lesions Neurologic: Grossly normal   ASSESSMENT AND PLAN:   Persistent atrial fibrillation (HCC) Rate appropriate for her condition  Chronic anticoagulation On Coumadin- she requested an INR today- 1.5  Chronic nonspecific lung disease (HCC) Severe COPD, chronic O2, cachexia   PLAN  I suggested she take Coumadin 5 mg today, then resume previous dosing of 3.75 mg daily except 5 mg on Mondays as per Lattie Haw Reid's not from 10/05/16 (Coumadin dosed by [place]). F/U with Dr Domenic Polite 3 months.   Kerin Ransom PA-C 10/16/2016 2:41 PM

## 2016-10-16 NOTE — Assessment & Plan Note (Signed)
Severe COPD, chronic O2, cachexia

## 2016-10-16 NOTE — Patient Instructions (Addendum)
Your physician recommends that you schedule a follow-up appointment in: 3 months Dr Domenic Polite   INR today 1.5, take two- 2.5 mg tablets TONIGHT, then go back to your usual schedule Lattie Haw has for you.    Follow up 11/02/16 with Edrick Oh at 1250 pm      Thank you for choosing [facility] !

## 2016-10-16 NOTE — Patient Outreach (Signed)
[facility]) Care Management  10/16/2016  [name] May 06, 1926 [id number]   [name], [name]'s care giver called but CM missed call and returned a call to her  [name] and [name] CM able to reschedule home visit for 10/28/16 at [place] to see Valerie Gosch for a home visit on 10/28/16 at 0930

## 2016-10-16 NOTE — Patient Outreach (Signed)
[facility]) Care Management  10/16/2016  PEARLENA OW 1925/12/18 [id number]   [facility] CM called to attempt to reschedule home visit appointment Voice message left. Request return call from [place], caregiver. [facility] CM left her [place] number. Unable to find [name] number in EPIC  Plan: I plan to reschedule home visit and see Mrs Yard in the next 1-2 weeks for a home visit

## 2016-10-16 NOTE — Assessment & Plan Note (Signed)
On Coumadin- she requested an INR today- 1.5

## 2016-10-19 DIAGNOSIS — I130 Hypertensive heart and chronic kidney disease with heart failure and stage 1 through stage 4 chronic kidney disease, or unspecified chronic kidney disease: Secondary | ICD-10-CM | POA: Diagnosis not present

## 2016-10-19 DIAGNOSIS — I509 Heart failure, unspecified: Secondary | ICD-10-CM | POA: Diagnosis not present

## 2016-10-22 ENCOUNTER — Telehealth: Payer: Self-pay | Admitting: Pulmonary Disease

## 2016-10-22 LAB — ALPHA-1 ANTITRYPSIN PHENOTYPE: A-1 Antitrypsin: 196 mg/dL (ref 83–199)

## 2016-10-22 MED ORDER — FLUTICASONE FUROATE-VILANTEROL 100-25 MCG/INH IN AEPB: 1.0000 | INHALATION_SPRAY | Freq: Every day | RESPIRATORY_TRACT | 0 refills | Status: DC

## 2016-10-22 NOTE — Telephone Encounter (Signed)
Spoke with pt's caregiver Irene Shipper, aware of recs.  Sample left up front for pt.   Nothing further needed.

## 2016-10-22 NOTE — Telephone Encounter (Signed)
Let's try Breo 100 - 1 inhalation daily. Please give her a sample and instruct her on oral hygiene. She can call back if this seems to help and we can send in a prescription at that time. J.

## 2016-10-22 NOTE — Telephone Encounter (Signed)
Spoke with pt's caregiver, who states pt reports of blurred vision for 1-2 min after taken anoro. Pt is requesting an alternative.   JN please advise. Thanks.

## 2016-10-26 ENCOUNTER — Telehealth: Payer: Self-pay | Admitting: *Deleted

## 2016-10-26 ENCOUNTER — Ambulatory Visit (INDEPENDENT_AMBULATORY_CARE_PROVIDER_SITE_OTHER): Payer: Medicare Other | Admitting: Psychology

## 2016-10-26 DIAGNOSIS — F331 Major depressive disorder, recurrent, moderate: Secondary | ICD-10-CM

## 2016-10-26 NOTE — Telephone Encounter (Signed)
[name], care giver for pt states that pt is currently on 40mg  of lasix and has continued to gain weight. She has gained appx 5lbs over the weekend. [name] is requesting a call back to confirm how to proceed. She states that pt is scheduled to see an alternate provider today, [place], if Dr Darnell Level is wanting to have her seen while she is already here. pls advise

## 2016-10-26 NOTE — Telephone Encounter (Signed)
What is actual weight? (in office 120lbs).  Recommend increase lasix to 40mg  BID x 3 days then return to prior dose of 40mg  daily. Update [place] with weight after this.  Ensure continues avoiding salt in diet

## 2016-10-26 NOTE — Telephone Encounter (Signed)
Weight was 121 lb 12 oz when she came to the office this AM. Notified of lasix change.

## 2016-10-27 ENCOUNTER — Other Ambulatory Visit: Payer: Self-pay | Admitting: Licensed Clinical Social Worker

## 2016-10-27 NOTE — Telephone Encounter (Signed)
[name] called back to verify how pt was to take the Lasix. [name] will start the bid dosing today and cb on Fri with wt.

## 2016-10-27 NOTE — Patient Outreach (Signed)
Assessment:  CSW spoke via phone with client. CSW verified client identity. CSW received verbal permission from client on 10/27/16 for CSW to speak with client about current needs and status of client. Client sees Dr. Danise Mina as primary care doctor. Client has support from her spouse and from her son. She has prescribed medications and is taking medications as prescribed. She has been receiving in home services support through [facility]. She uses a walker to help her walk She said she likes for providers to leave information on her answering machine so she can later review information and be updated on provider support and information.  Client said that her family helps to transport her to and from her scheduled medical appointments. CSW and client spoke of client care plan. CSW encouraged that client  participate in all scheduled client in home physical therapy sessions for client in next 30 days. Client did not mention any pain issues for client. She said she had appointment several weeks ago with Dr. Danise Mina. CSW spoke with client about [facility] program support in nursing, social work and pharmacy. CSW encouraged that client or her spouse please call CSW at [phone] as needed to discuss social work needs of client. Client was appreciative of call from [place] on 10/27/16.    Plan:  Client to participate in all scheduled client in home physical therapy sessions for client in next 30 days.  CSW to call client in 4 weeks to assess client needs at that time.  Norva Riffle.Estiven Kohan MSW, LCSW Licensed Clinical Social Worker [facility] Care Management [phone]

## 2016-10-28 ENCOUNTER — Other Ambulatory Visit (HOSPITAL_COMMUNITY): Payer: Self-pay | Admitting: *Deleted

## 2016-10-28 ENCOUNTER — Other Ambulatory Visit: Payer: Self-pay | Admitting: *Deleted

## 2016-10-28 NOTE — Patient Outreach (Signed)
[facility]) Care Management   10/28/2016  [name] December 20, 1925 [id number]  [name] is an 83 y.o. female with medical history significant of atrial fibrillation, CHF, HTN, hypothyroidism, DM Type II, GERD, and nonspecific lung disease with worsening shortness of breath for 1 week prior to her inpatient Admission on 09/11/16. She was discharged to home with home health RN and PT visits.  Valerie Montgomerywas referred to [facility] for CHF Disease Management and care coordination and level of care concerns. This is there third home visit to [name] since she was referred to [facility] program in March 2018.  She presents as usually to be sitting up in her rollator in her living room after noting lying on her living room couch.  She is smiling and welcomes [name] CM visit along with her caregiver, [name].  Mr Mcbrearty continues to report memory issues but was able to recall her cbg value from 10/27/16 as 110.  Her husband is present during this visit and is sleeping one of the living room couches.    Subjective:  "I can't pee"  Objective:    Review of Systems  Constitutional: Negative for chills, diaphoresis, fever, malaise/fatigue and weight loss.       Constant warm at intervals when getting up heating blanket   HENT: Positive for hearing loss. Negative for congestion, ear discharge, ear pain, nosebleeds, sinus pain, sore throat and tinnitus.   Eyes: Negative for blurred vision, double vision, photophobia, pain, discharge and redness.       Wears glasses  Respiratory: Negative for hemoptysis, sputum production and stridor.        Continues on oxygen 2 L [place]   Cardiovascular: Positive for leg swelling. Negative for chest pain, palpitations, orthopnea, claudication and PND.       Constant pressure in middle of chest with moving  With more edema noted on her left ankle and feet than on the right  Gastrointestinal: Positive for abdominal pain.  Negative for blood in stool, constipation, diarrhea, heartburn, melena, nausea and vomiting.  Genitourinary: Negative for dysuria, flank pain, frequency, hematuria and urgency.       Reports urine retention/delay in voiding  Musculoskeletal: Positive for myalgias. Negative for falls, joint pain and neck pain.  Skin: Negative.  Negative for itching and rash.  Neurological: Positive for sensory change and weakness. Negative for dizziness, tingling, tremors, speech change, focal weakness, seizures, loss of consciousness and headaches.       Noted tingling in tips of her toes on the left foot  Endo/Heme/Allergies: Negative for environmental allergies and polydipsia.  Psychiatric/Behavioral: Positive for memory loss. Negative for depression, hallucinations, substance abuse and suicidal ideas. The patient is not nervous/anxious and does not have insomnia.     Physical Exam  Constitutional: She is oriented to person, place, and time. She appears well-developed and well-nourished.  HENT:  Head: Normocephalic and atraumatic.  Right Ear: External ear normal.  Left Ear: External ear normal.  Eyes: Pupils are equal, round, and reactive to light.  Neck: Normal range of motion. Neck supple.  Cardiovascular: Intact distal pulses.   Noted irregular heart rate related to atrial fibrillation  GI: She exhibits distension.  Musculoskeletal: Normal range of motion. She exhibits edema.  Complaint of left shoulder pain that is relieved when she raising the left arm when lying on the couch.    Neurological: She is alert and oriented to person, place, and time.  Skin: Skin is warm and dry.  Psychiatric: She has a normal mood and affect. Her behavior is normal. Judgment and thought content normal.    Encounter Medications:   Outpatient Encounter Prescriptions as of 10/28/2016  Medication Sig Note  . acetaminophen (TYLENOL) 500 MG tablet Take 2 tablets (1,000 mg total) by mouth 3 (three) times daily. 09/30/2016:  Now prn only  . Blood Glucose Monitoring Suppl (FREESTYLE LITE) DEVI Check blood sugar once daily and as directed. Dx E11.9   . Calcium-Magnesium-Vitamin D [id number] MG-MG-UNIT TABS Take 1 tablet by mouth 2 (two) times daily.   Marland Kitchen donepezil (ARICEPT) 5 MG tablet Take 1 tablet (5 mg total) by mouth at bedtime.   . felodipine (PLENDIL) 2.5 MG 24 hr tablet TAKE 1 TABLET DAILY   . ferrous sulfate 325 (65 FE) MG tablet Take 325 mg by mouth daily with breakfast.   . fluticasone furoate-vilanterol (BREO ELLIPTA) 100-25 MCG/INH AEPB Inhale 1 puff into the lungs daily.   . furosemide (LASIX) 20 MG tablet Take 2 tablets (40 mg total) by mouth daily.   Marland Kitchen glucose blood (FREESTYLE LITE) test strip Check blood sugar once daily and as directed. Dx E11.9   . Lancets (FREESTYLE) lancets Check blood sugar once daily and as directed. Dx E11.9   . latanoprost (XALATAN) 0.005 % ophthalmic solution Place 1 drop into both eyes at bedtime.    Marland Kitchen levothyroxine (SYNTHROID, LEVOTHROID) 50 MCG tablet TAKE 1 TABLET DAILY   . lidocaine (LIDODERM) 5 % Place 1 patch onto the skin daily. Remove & Discard patch within 12 hours or as directed by MD (Patient taking differently: Place 1 patch onto the skin daily as needed. Remove & Discard patch within 12 hours or as directed by MD)   . LORazepam (ATIVAN) 0.5 MG tablet Take 1 tablet (0.5 mg total) by mouth every 8 (eight) hours as needed for anxiety. 09/30/2016: Now to once a week for anxiety  . Misc Natural Products (FIBER 7) POWD Take by mouth daily.   . Multiple Vitamin (MULTIVITAMIN) capsule Take 1 capsule by mouth every morning.    . Omega-3 Fatty Acids (FISH OIL CONCENTRATE) 1000 MG CAPS Take 2 capsules by mouth 2 (two) times daily.     . potassium chloride SA (K-DUR,KLOR-CON) 20 MEQ tablet Take 1 tablet (20 mEq total) by mouth daily.   . sertraline (ZOLOFT) 100 MG tablet Take 1 tablet (100 mg total) by mouth daily.   . vitamin B-12 (CYANOCOBALAMIN) 500 MCG tablet Take 500 mcg  by mouth daily.   Marland Kitchen warfarin (COUMADIN) 2.5 MG tablet Take 1 tablet daily except 1 1/2 tablets on Mondays and Thursdays (Patient taking differently: Take 3.75 mg by mouth every evening. ) 09/30/2016: 09/30/16 doses depends on lab values    No facility-administered encounter medications on file as of 10/28/2016.     Assessment:   Level of Care Concerns- Valerie. Spath continues to do well living at home with her husband in [place] and prefers to continue to remain in her home with home health services and the assistance of [name]. [name], neighbor, (present today for our visit) continues to Valerie. Baney three times daily as per an arrangement made with the family.  [name] continues to help Valerie. Lindh to weigh daily and record. Target goal weight discussed by her providers is 115 lbs per [name].  Was was last at 115 lbs on 10/17/16 Last weights are as following: 10/24/16 117.6 lbs 10/25/16 120.6 lbs 10/26/16 122.2 lbs 10/27/16 119 lbs 10/28/16 119.8 lbs Valerie Solum and [name]  has been able to verbalize the need to report a weight gain or worsening of symptoms listed in the COPD/CHF zones listed in [place] to her doctor and on 10/25/16 there was an increase in her lasix to 40 mg BID x 3 days.  Valerie Sarkisyan voices increase interest in eating but has voiced intake of foods with increase salt content.  She and [name] report the patient has recently request certain foods she likes and has request salt not to be added (like french fries but they are already salted and fish sandwiches brought to her by her son "daily"). [facility] CM discussed the importance of not taking in processed, fastfoods or can foods with increase contents of salt.  Valerie Monfils voiced understanding. She continues to be followed by [facility] home health RN, [name] and the last [facility] visit is scheduled for 10/29/16. PT services will be concluding soon but pt noted on this visit to need only stand by assistance as she got out of her  chair and went to the bedside commode near her within 1 minute using her walker.    Chronic Health Condition (CHF, COPD, DM)- Valerie Renn has seen Dr Danise Mina and now prefers to continue to see Dr Danise Mina versus Dr Damita Dunnings.  She was scheduled to see CV staff, Bunnie Domino on 10/16/16 but saw Dr Rosalyn Gess instead.  Valerie Corle still continues not to want to be seen by Dr Domenic Polite.  [facility] CM called the CV office while in the home to speak with staff to request again a change in Cardiologist from Dr Domenic Polite to "any other doctor in the office" per Valerie Vegh's  Preference. Previous calls to to change Cv provider resulted in CV provider not approving a change per Valerie Monahan's request. She is scheduled to see her podiatrist, Gardiner Barefoot on 12/02/16 at 1400. She saw her dentist recently. [name] and Valerie Esperanza were encouraged to make and appointment with Dr Katy Fitch to assist with getting a hearing aid.   Valerie Emigh voiced that she did not feel symptoms of depression while [facility] CM visited. Sh had a 10/26/16 appointment with Caroline Sauger, [place], at [facility] for her first evaluation for counseling. All of Valerie Wahlberg appointments are managed by [name] with the [facility] calendar provided during the last visit.  Valerie Golda states she has always been told that she has been told her diabetes "is borderline"  Acute medical conditions (left chest/shoulder pain, sensory changes of toes on left foot,  urinary retention?) Valerie Swalley complains of urinary retention.  Her abdomen is tight, distended.  She has been taking in the recommended amounts of fluid (2 Liters) in her fluid restriction. While [facility] CM present in the home, Valerie Zervas was educated on how to massage/tap her bladder to assist with the release of urine.  The noted urine left in her bedside commode that had not been emptied from the night before was dark in color and not a good representation of urine color but pt denies  dark urine color and dysuria. She voided prior to CM leaving her home.   Valerie Cragin reports sensory changes of toes on left foot that she reports she maybe related to an exercise she has been instructed to complete by her PT staff Reports sensory changes is not constant. Valerie Handrich reports some noted left tenderness under her left breast and shoulder area that is relieved by lying down and lifting her left arm up.  THN CM unable to duplicate the noted tenderness  as she sat on her rollator during the home visit.  [facility] CM encouraged Valerie Hendershott and Lynelle Smoke to monitor this pain and sensory change and to report them to the pcp if they continue.  Plan: Valerie Greb will be seen for a home visit in 2 weeks on 11/11/16    [facility] CM Care Plan Problem One     Most Recent Value  Care Plan Problem One  Level of Care Concerns and Care Coordination Needs  Role Documenting the Problem One  Care Management Coordinator  Care Plan for Problem One  Active  [facility]N Long Term Goal (31-90 days)  Over the next 60 days, patient and family will verbalize understanding of patient care needs and options for assitance in the home and level of care needs and community resources  [facility] Long Term Goal Start Date  09/25/16  [facility] Long Term Goal Met Date  10/28/16  Interventions for Problem One Long Term Goal  Collaboration with patient/family/LCSW re: care needs and community resources  [facility] CM Short Term Goal #1 (0-30 days)  Over the next 7 days, patient/family will meet with critical risk nurse case manager re: current health conditions, educational needs, and level of care concerns  [facility] CM Short Term Goal #1 Start Date  09/25/16  [facility] CM Short Term Goal #1 Met Date  10/14/16  Interventions for Short Term Goal #1  scheduled home visit with patient/family after discussion about provider referral and care concerns    [facility] CM Care Plan Problem Two     Most Recent Value  Care Plan Problem Two  Knowledge Deficits related to Chronic  Health Condition (CHF) as evidenced by patient and caregiver questions about plan of care  Role Documenting the Problem Two  Care Management Coordinator  Care Plan for Problem Two  Active  Interventions for Problem Two Long Term Goal   Discussed need for additional knowledge related to plan of care for management of CHF,  scheduled home visit with patient/spouse/caregiver  [facility] Long Term Goal (31-90) days  Over the next 60 days, patient/family/caregiver will verbalize understanding of plan of care for management of CHF  [facility]N Long Term Goal Start Date  09/28/16  [facility] CM Short Term Goal #1 (0-30 days)  Over the next 30 days, patient will weigh daily adn record and verbalize understanding of when to call for weight gain and rationale for doing so  [facility]N CM Short Term Goal #1 Start Date  09/28/16  [facility] CM Short Term Goal #1 Met Date   10/28/16  Interventions for Short Term Goal #2   Discussed with patient and caregiver rationale and necessity of daily weight, recording, and reporting weight gain,  Emmi educational materials prescribed  [facility]N CM Short Term Goal #2 (0-30 days)  Over the next 30 days, patient/spouse/caregiver will verbalize understanding of signs and symptoms of CHF and when to report  [facility] CM Short Term Goal #2 Start Date  09/28/16  [facility] CM Short Term Goal #2 Met Date  10/28/16  Interventions for Short Term Goal #2  Discussed signs and symptoms of early heart failure and when to report,  Emmi educational materials prescribed  THN CM Short Term Goal #3 (0-30 days)  Over the next 30 days, patient will attend provider appointment at scheduled  [facility] CM Short Term Goal #3 Start Date  09/28/16  [facility] CM Short Term Goal #3 Met Date  10/28/16  Interventions for Short Term Goal #3  reviewed upcoming pulmonary provider appointment w/ patient/caregiver  THN CM Short Term Goal #4 (0-30 days)  Over the next 30 days the patient/cargiver and family will verbalize the importance of decreasing  salt containing foods  to prevent edema of ankles and other symptoms of CHF   [facility]N CM Short Term Goal #4 Start Date  10/28/16  Interventions of Short Term Goal #4  Review avoidance of fast foods, processed  and can foods that have increase salt content and lead to edema, review CHF low sodium diet        Pinkie Manger L. Lavina Hamman, RN, BSN, [facility] [phone]

## 2016-10-29 ENCOUNTER — Ambulatory Visit (HOSPITAL_COMMUNITY)
Admission: RE | Admit: 2016-10-29 | Discharge: 2016-10-29 | Disposition: A | Discharge status: Home / Self Care | Payer: Medicare Other | Source: Ambulatory Visit | Attending: Family Medicine | Admitting: Family Medicine

## 2016-10-29 DIAGNOSIS — M810 Age-related osteoporosis without current pathological fracture: Secondary | ICD-10-CM | POA: Diagnosis not present

## 2016-10-29 DIAGNOSIS — I509 Heart failure, unspecified: Secondary | ICD-10-CM | POA: Diagnosis not present

## 2016-10-29 DIAGNOSIS — I130 Hypertensive heart and chronic kidney disease with heart failure and stage 1 through stage 4 chronic kidney disease, or unspecified chronic kidney disease: Secondary | ICD-10-CM | POA: Diagnosis not present

## 2016-10-29 MED ORDER — IBANDRONATE SODIUM 3 MG/3ML IV SOLN
3.0000 mg | Freq: Once | INTRAVENOUS | Status: AC
  Administered 2016-10-29: 12:00:00 3 mg via INTRAVENOUS

## 2016-10-29 MED ORDER — IBANDRONATE SODIUM 3 MG/3ML IV SOLN
INTRAVENOUS | Status: DC
Start: 2016-10-29 — End: 2016-10-30
  Filled 2016-10-29: qty 3

## 2016-10-30 ENCOUNTER — Ambulatory Visit: Payer: Medicare Other | Admitting: Family Medicine

## 2016-10-30 ENCOUNTER — Telehealth: Payer: Self-pay | Admitting: Pulmonary Disease

## 2016-10-30 MED ORDER — FLUTICASONE FUROATE-VILANTEROL 100-25 MCG/INH IN AEPB: 1.0000 | INHALATION_SPRAY | Freq: Every day | RESPIRATORY_TRACT | 6 refills | Status: AC

## 2016-10-30 NOTE — Telephone Encounter (Signed)
[name] contacted office, and states that pts weight this am was 118.7lbs

## 2016-10-30 NOTE — Telephone Encounter (Signed)
Called and spoke with pts caregiver and she is aware of the rx for the breo that has been sent to the pharmacy.  She stated that the pt was given sample of the breo to try and pt has been doing well on this.  rx sent.

## 2016-10-30 NOTE — Telephone Encounter (Signed)
Spoke with [name]. Patient has no dyspnea and swelling has improved a great deal. Advised to return to 40mg  lasix QD and to call if symptoms return. She verbalized understanding.

## 2016-10-30 NOTE — Telephone Encounter (Addendum)
If no dyspnea and swelling has improved, rec return to 40mg  once daily dosing of lasix. If ongoing leg swelling, will recommend continue lasix 40mg  BID x 1 wk.

## 2016-11-02 ENCOUNTER — Ambulatory Visit (INDEPENDENT_AMBULATORY_CARE_PROVIDER_SITE_OTHER): Payer: Medicare Other | Admitting: *Deleted

## 2016-11-02 DIAGNOSIS — Z5181 Encounter for therapeutic drug level monitoring: Secondary | ICD-10-CM | POA: Diagnosis not present

## 2016-11-02 DIAGNOSIS — G458 Other transient cerebral ischemic attacks and related syndromes: Secondary | ICD-10-CM | POA: Diagnosis not present

## 2016-11-02 DIAGNOSIS — I481 Persistent atrial fibrillation: Secondary | ICD-10-CM | POA: Diagnosis not present

## 2016-11-02 DIAGNOSIS — I4819 Other persistent atrial fibrillation: Secondary | ICD-10-CM

## 2016-11-02 LAB — POCT INR: INR: 1.8

## 2016-11-03 ENCOUNTER — Ambulatory Visit (INDEPENDENT_AMBULATORY_CARE_PROVIDER_SITE_OTHER): Payer: Medicare Other | Admitting: Internal Medicine

## 2016-11-03 ENCOUNTER — Telehealth: Payer: Self-pay | Admitting: Pulmonary Disease

## 2016-11-03 ENCOUNTER — Encounter: Payer: Self-pay | Admitting: Internal Medicine

## 2016-11-03 VITALS — BP 136/82 | HR 92 | Temp 97.1°F | Wt 118.0 lb

## 2016-11-03 DIAGNOSIS — M62830 Muscle spasm of back: Secondary | ICD-10-CM

## 2016-11-03 LAB — POC URINALSYSI DIPSTICK (AUTOMATED)
Bilirubin, UA: NEGATIVE
Blood, UA: NEGATIVE
GLUCOSE UA: NEGATIVE
Ketones, UA: NEGATIVE
NITRITE UA: NEGATIVE
Protein, UA: NEGATIVE
Spec Grav, UA: 1.015 (ref 1.010–1.025)
UROBILINOGEN UA: NEGATIVE U/dL — AB
pH, UA: 7.5 (ref 5.0–8.0)

## 2016-11-03 MED ORDER — METHOCARBAMOL 500 MG PO TABS: 250.0000 mg | ORAL_TABLET | Freq: Two times a day (BID) | ORAL | 0 refills | Status: DC | PRN

## 2016-11-03 NOTE — Telephone Encounter (Signed)
PA for Breo 100 was started on CMM. PA key is [id number]. PA was approved.   Pharmacy is aware.

## 2016-11-03 NOTE — Progress Notes (Signed)
Subjective:    Patient ID: [name], female    DOB: [birth year], 83 y.o.   MRN: [id number]  HPI  Pt presents with c/o back spasms. She reports this started about 6 weeks. The spasms are located on both sides of her lower back. She describes the pain as grabbing. The pain does not radiate. Movement does not seem to make it worse. She denies numbness or tingling in her legs. She denies issues with her bowel and bladder. She denies recent fall or trauma to the area. She has tried Tylenol without any relief.   Review of Systems  Past Medical History:  Diagnosis Date  . Anemia   . Anxiety   . Aortic sclerosis   . Arthritis   . CHF (congestive heart failure) ([place])   . Chronic atrial fibrillation ([place])   . Chronic gastritis 10/2009   Duodenitis on EGD  . CKD (chronic kidney disease) stage 2, GFR 60-89 ml/min   . Collagen vascular disease ([place])   . COPD (chronic obstructive pulmonary disease) ([place])   . Depression   . Diabetes type 2, controlled ([place])    Diet controlled  . Diverticulosis   . Dry ARMD    Retinal hemorrhage (09/2013) [place]  . Essential hypertension, benign   . GERD (gastroesophageal reflux disease)   . Glaucoma    Dr. Venetia Maxon  . Hiatal hernia 2011   Small  . History of pelvic fracture April 2013  . History of rheumatic fever   . History of skin cancer   . History of TIA (transient ischemic attack)   . Hypothyroidism   . Macular degeneration   . Mixed hyperlipidemia   . Osteoporosis 04/2013   Thoracic compression fracture, on bisphosphonate and cal/vit D  . Oxygen deficiency   . Pulmonary nodules    Stable bilateral on CT 01/2010, rec rpt yearly for 2 yrs, pt decided to stop f/u  . Seasonal allergies   . Vertebral compression fracture ([place]) 05/2016   Vertebroplasty L2/L3    Current Outpatient Prescriptions  Medication Sig Dispense Refill  . acetaminophen (TYLENOL) 500 MG tablet Take 2 tablets (1,000 mg total) by mouth 3 (three) times daily.    .  Blood Glucose Monitoring Suppl (FREESTYLE LITE) DEVI Check blood sugar once daily and as directed. Dx E11.9 90 each 1  . Calcium-Magnesium-Vitamin D [id number] MG-MG-UNIT TABS Take 1 tablet by mouth 2 (two) times daily.    Marland Kitchen donepezil (ARICEPT) 5 MG tablet Take 1 tablet (5 mg total) by mouth at bedtime. 30 tablet 3  . felodipine (PLENDIL) 2.5 MG 24 hr tablet TAKE 1 TABLET DAILY 90 tablet 1  . ferrous sulfate 325 (65 FE) MG tablet Take 325 mg by mouth daily with breakfast.    . fluticasone furoate-vilanterol (BREO ELLIPTA) 100-25 MCG/INH AEPB Inhale 1 puff into the lungs daily. 1 each 6  . furosemide (LASIX) 20 MG tablet Take 2 tablets (40 mg total) by mouth daily.    Marland Kitchen glucose blood (FREESTYLE LITE) test strip Check blood sugar once daily and as directed. Dx E11.9 100 each 1  . Lancets (FREESTYLE) lancets Check blood sugar once daily and as directed. Dx E11.9 100 each 1  . latanoprost (XALATAN) 0.005 % ophthalmic solution Place 1 drop into both eyes at bedtime.     Marland Kitchen levothyroxine (SYNTHROID, LEVOTHROID) 50 MCG tablet TAKE 1 TABLET DAILY 90 tablet 3  . lidocaine (LIDODERM) 5 % Place 1 patch onto the skin daily. Remove & Discard patch  within 12 hours or as directed by MD (Patient not taking: Reported on 10/28/2016) 30 patch 6  . LORazepam (ATIVAN) 0.5 MG tablet Take 1 tablet (0.5 mg total) by mouth every 8 (eight) hours as needed for anxiety. (Patient not taking: Reported on 10/28/2016) 60 tablet 0  . Misc Natural Products (FIBER 7) POWD Take by mouth daily.    . Multiple Vitamin (MULTIVITAMIN) capsule Take 1 capsule by mouth every morning.     . Omega-3 Fatty Acids (FISH OIL CONCENTRATE) 1000 MG CAPS Take 2 capsules by mouth 2 (two) times daily.      . potassium chloride SA (K-DUR,KLOR-CON) 20 MEQ tablet Take 1 tablet (20 mEq total) by mouth daily. 30 tablet 3  . sertraline (ZOLOFT) 100 MG tablet Take 1 tablet (100 mg total) by mouth daily. 90 tablet 1  . vitamin B-12 (CYANOCOBALAMIN) 500 MCG tablet  Take 500 mcg by mouth daily.    Marland Kitchen warfarin (COUMADIN) 2.5 MG tablet Take 1 tablet daily except 1 1/2 tablets on Mondays and Thursdays (Patient taking differently: Take 3.75 mg by mouth every evening. ) 180 tablet 3   No current facility-administered medications for this visit.     Allergies  Allergen Reactions  . Propoxyphene N-Acetaminophen Shortness Of Breath  . Codeine Nausea And Vomiting  . Meperidine Hcl Nausea And Vomiting  . Morphine Nausea And Vomiting  . Shellfish Allergy Hives  . Tramadol Nausea Only    Family History  Problem Relation Age of Onset  . Coronary artery disease Mother 34    MI deceased  . Colon cancer Father 75  . Asthma Father   . Asthma Sister   . Stroke Paternal Grandmother   . Diabetes Neg Hx     Social History   Social History  . Marital status: Married    Spouse name: N/A  . Number of children: N/A  . Years of education: N/A   Occupational History  . Retired     Former Therapist, sports   Social History Main Topics  . Smoking status: Former Smoker    Packs/day: 1.00    Years: 30.00    Types: Cigarettes    Quit date: 10/13/1998  . Smokeless tobacco: Never Used  . Alcohol use No  . Drug use: No  . Sexual activity: No   Other Topics Concern  . 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      [facility] (10/13/16):   Originally from [place]. Moved to the [place] at 83 y.o. She has lived in [place] and moved to [place] in 1963. Retired Therapist, sports. No known TB exposure. Has dogs currently. No bird or mold exposure.          Gastrointestinal: Denies abdominal pain, bloating, constipation, diarrhea or blood in the stool.  GU: Denies urgency, frequency, pain with urination, burning  sensation, blood in urine, odor or discharge. Musculoskeletal: Pt reports back spasms. Denies decrease in range of motion, difficulty with gait, or joint pain and swelling.    No other specific complaints in a complete review of systems (except as listed in HPI above).     Objective:   Physical  Exam   BP 136/82   Pulse 92   Temp 97.1 F (36.2 C) (Tympanic)   Wt 118 lb (53.5 kg)   SpO2 98%   BMI 21.58 kg/m  Wt Readings from Last 3 Encounters:  11/03/16 118 lb (53.5 kg)  10/29/16 112 lb (50.8 kg)  10/16/16 117 lb (53.1 kg)    General: Appears her stated age, chronically ill appearing, in NAD. Abdomen: Soft and nontender. Normal bowel sounds. No distention or masses noted.  Musculoskeletal: No bony tenderness noted over the spine. Pt's torso contorts when spasm comes. Pain with palpation of bilateral paralumbar muscles.   BMET    Component Value Date/Time   NA 141 10/14/2016 1057   NA 143 04/15/2011   K 3.6 10/14/2016 1057   K 4.4 04/15/2011   CL 102 10/14/2016 1057   CL 104 04/15/2011   CO2 32 10/14/2016 1057   GLUCOSE 119 (H) 10/14/2016 1057   BUN 13 10/14/2016 1057   BUN 18 04/15/2011   CREATININE 0.66 10/14/2016 1057   CREATININE 0.82 05/30/2012 0945   CALCIUM 9.6 10/14/2016 1057   CALCIUM 9.2 04/15/2011   GFRNONAA >60 09/15/2016 0610   GFRAA >60 09/15/2016 0610    Lipid Panel     Component Value Date/Time   CHOL 174 08/01/2015 0845   TRIG 76.0 08/01/2015 0845   TRIG 91 04/14/2011   HDL 49.00 08/01/2015 0845   CHOLHDL 4 08/01/2015 0845   VLDL 15.2 08/01/2015 0845   LDLCALC 109 (H) 08/01/2015 0845    CBC    Component Value Date/Time   WBC 9.8 09/22/2016 1317   RBC 3.72 (L) 09/22/2016 1317   HGB 13.1 09/22/2016 1317   HCT 39.3 09/22/2016 1317   PLT 383.0 09/22/2016 1317   MCV 105.4 (H) 09/22/2016 1317   MCH 35.3 (H) 09/11/2016 1201   MCHC 33.5 09/22/2016 1317   RDW 14.4 09/22/2016 1317   LYMPHSABS 2.1 09/22/2016 1317   MONOABS 1.1 (H)  09/22/2016 1317   EOSABS 0.2 09/22/2016 1317   BASOSABS 0.1 09/22/2016 1317    Hgb A1C Lab Results  Component Value Date   HGBA1C 6.2 08/01/2015           Assessment & Plan:   Muscle Spasms of Back:  Urinalysis: trace leuks Advised her to try heat Continue Tylenol Stretching exercises given to try in the bed Will try a very low dose muscle relaxer Robaxin 250 mg daily prn- sedation caution given to her and her husband. Advised her husband to not leave her by herself if she is going to take the Robaxin  If symptoms persist or worsen, follow up with PCP. Webb Silversmith, NP

## 2016-11-03 NOTE — Patient Instructions (Signed)
Muscle Cramps and Spasms Muscle cramps and spasms are when muscles tighten by themselves. They usually get better within minutes. Muscle cramps are painful. They are usually stronger and last longer than muscle spasms. Muscle spasms may or may not be painful. They can last a few seconds or much longer. Follow these instructions at home:  Drink enough fluid to keep your pee (urine) clear or pale yellow.  Massage, stretch, and relax the muscle.  If directed, apply heat to tight or tense muscles as often as told by your doctor. Use the heat source that your doctor recommends.  Place a towel between your skin and the heat source.  Leave the heat on for 20-30 minutes.  Take off the heat if your skin turns bright red. This is especially important if you are unable to feel pain, heat, or cold. You may have a greater risk of getting burned.  If directed, put ice on the affected area. This may help if you are sore or have pain after a cramp or spasm.  Put ice in a plastic bag.  Place a towel between your skin and the bag.  Leave the ice on for 20 minutes, 2-3 times a day.  Take over-the-counter and prescription medicines only as told by your doctor.  Pay attention to any changes in your symptoms. Contact a doctor if:  Your cramps or spasms get worse or happen more often.  Your cramps or spasms do not get better with time. This information is not intended to replace advice given to you by your health care provider. Make sure you discuss any questions you have with your health care provider. Document Released: 06/18/2008 Document Revised: 08/07/2015 Document Reviewed: 04/09/2015 Elsevier Interactive Patient Education  2017 [employer].

## 2016-11-04 ENCOUNTER — Encounter: Payer: Self-pay | Admitting: Internal Medicine

## 2016-11-10 ENCOUNTER — Telehealth: Payer: Self-pay

## 2016-11-10 DIAGNOSIS — M62830 Muscle spasm of back: Secondary | ICD-10-CM

## 2016-11-10 NOTE — Telephone Encounter (Signed)
I do not want her taking it more often, and do not refill. I advised her to follow up with PCP if no improvement. She needs to schedule a follow up, especially if med not helping.

## 2016-11-10 NOTE — Telephone Encounter (Signed)
Pt has a f/u appt scheduled Thurs with Dr Darnell Level-- I spoke to [name] ok per HIPAA

## 2016-11-10 NOTE — Telephone Encounter (Signed)
[name] friend of pt (DPR signed) left v/m; pt was seen on 11/03/16 for back spasms; pt taking muscle relaxant 1/2 tab bid and most of the day pt having a lot of pain with muscle spasms. [name] wants to know if can take muscle relaxant more often. Pt has 3 days of methocarbamol left and request refill  With new instructions or if cannot increase med request same instructions to [facility]. Please advise.

## 2016-11-11 ENCOUNTER — Telehealth: Payer: Self-pay | Admitting: Pulmonary Disease

## 2016-11-11 ENCOUNTER — Other Ambulatory Visit: Payer: Self-pay | Admitting: *Deleted

## 2016-11-11 MED ORDER — FLUTICASONE-SALMETEROL 250-50 MCG/DOSE IN AEPB: 1.0000 | INHALATION_SPRAY | Freq: Two times a day (BID) | RESPIRATORY_TRACT | 3 refills | Status: DC

## 2016-11-11 NOTE — Patient Outreach (Signed)
[facility]) Care Management   11/11/2016  [name] [birth year] [id number]  [name] is an 83 y.o. female with medical history significant of atrial fibrillation, CHF, HTN, hypothyroidism, DM Type II, GERD, and nonspecific lung disease with worsening shortness of breath for 1 week prior to her inpatient Admission on 09/11/16. She was discharged to home with home health RN and PT visits.  [name] referred to [place] [facility] for CHF Disease Management and care coordination and level of care concerns. This is there fourth home visit to [name] since she was referred to [facility] program in March 2018.  She presents as usually to be sitting up in her rollator in her living room after noting lying on her living room couch. She welcomes the [place] visit along with her caregiver, [name].  Mr. Mynhier is present again today during this visit and is sleeping one of the living room couches.    Subjective: " I don't want to have anyone else come in to see me" Referring to possible ADTS personal care services when offered,  "I will not give up my fish sandwich" when discussed need to avoid excess salt in diet  Objective:   BP 110/64 (BP Location: Left Arm, Patient Position: Sitting, Cuff Size: Normal)   Pulse 76   Resp 20   Wt 117 lb 9.6 oz (53.3 kg)   SpO2 94%   BMI 21.51 kg/m   Review of Systems  Constitutional: Positive for malaise/fatigue. Negative for chills, diaphoresis, fever and weight loss.  HENT: Positive for congestion and hearing loss. Negative for ear discharge, ear pain, nosebleeds, sinus pain, sore throat and tinnitus.        Nasal congestion  Eyes: Negative for blurred vision, double vision, photophobia, pain, discharge and redness.  Respiratory: Negative for cough, hemoptysis, sputum production, shortness of breath, wheezing and stridor.   Cardiovascular: Positive for palpitations. Negative for chest pain, orthopnea,  claudication and leg swelling.       Afib fluttering noted at intervals  Gastrointestinal: Negative for abdominal pain, blood in stool, constipation, diarrhea, heartburn, melena, nausea and vomiting.  Genitourinary: Negative for dysuria, flank pain, frequency, hematuria and urgency.       Dribbles  Musculoskeletal: Positive for back pain.  Skin: Negative for itching and rash.  Neurological: Positive for tingling, tremors and weakness. Negative for dizziness, sensory change, speech change, focal weakness, seizures, loss of consciousness and headaches.  Endo/Heme/Allergies: Negative for environmental allergies and polydipsia. Bruises/bleeds easily.  Psychiatric/Behavioral: Positive for memory loss. Negative for depression, hallucinations, substance abuse and suicidal ideas. The patient is nervous/anxious. The patient does not have insomnia.        Short term memory issues Some anxious  Dreams     Physical Exam  Constitutional: She is oriented to person, place, and time. She appears well-developed and well-nourished.  HENT:  Head: Normocephalic and atraumatic.  Eyes: Conjunctivae are normal. Pupils are equal, round, and reactive to light.  Neck: Normal range of motion. Neck supple.  Cardiovascular: Normal rate, regular rhythm, normal heart sounds and intact distal pulses.   Respiratory: Effort normal and breath sounds normal.  GI: Soft. Bowel sounds are normal.  Musculoskeletal: Normal range of motion.  Neurological: She is alert and oriented to person, place, and time. She displays tremor.  Skin: Skin is warm and dry.  Psychiatric: She has a normal mood and affect. Her behavior is normal. Judgment and thought content normal.    Encounter Medications:  Outpatient Encounter Prescriptions as of 11/11/2016  Medication Sig Note  . acetaminophen (TYLENOL) 500 MG tablet Take 2 tablets (1,000 mg total) by mouth 3 (three) times daily. 09/30/2016: Now prn only  . Blood Glucose Monitoring Suppl  (FREESTYLE LITE) DEVI Check blood sugar once daily and as directed. Dx E11.9   . Calcium-Magnesium-Vitamin D 500-250-200 MG-MG-UNIT TABS Take 1 tablet by mouth 2 (two) times daily.   . donepezil (ARICEPT) 5 MG tablet Take 1 tablet (5 mg total) by mouth at bedtime.   . felodipine (PLENDIL) 2.5 MG 24 hr tablet TAKE 1 TABLET DAILY   . ferrous sulfate 325 (65 FE) MG tablet Take 325 mg by mouth daily with breakfast.   . fluticasone furoate-vilanterol (BREO ELLIPTA) 100-25 MCG/INH AEPB Inhale 1 puff into the lungs daily.   . Fluticasone-Salmeterol (ADVAIR DISKUS) 250-50 MCG/DOSE AEPB Inhale 1 puff into the lungs 2 (two) times daily.   . furosemide (LASIX) 20 MG tablet Take 2 tablets (40 mg total) by mouth daily.   . glucose blood (FREESTYLE LITE) test strip Check blood sugar once daily and as directed. Dx E11.9   . Lancets (FREESTYLE) lancets Check blood sugar once daily and as directed. Dx E11.9   . latanoprost (XALATAN) 0.005 % ophthalmic solution Place 1 drop into both eyes at bedtime.    . levothyroxine (SYNTHROID, LEVOTHROID) 50 MCG tablet TAKE 1 TABLET DAILY   . lidocaine (LIDODERM) 5 % Place 1 patch onto the skin daily. Remove & Discard patch within 12 hours or as directed by MD 10/28/2016: Has not used in a long time - use as needed  . LORazepam (ATIVAN) 0.5 MG tablet Take 1 tablet (0.5 mg total) by mouth every 8 (eight) hours as needed for anxiety. 10/28/2016: Use as needed  last time taken last week x 1 "rarely"   . methocarbamol (ROBAXIN) 500 MG tablet Take 0.5 tablets (250 mg total) by mouth 2 (two) times daily as needed for muscle spasms.   . Misc Natural Products (FIBER 7) POWD Take by mouth daily.   . Multiple Vitamin (MULTIVITAMIN) capsule Take 1 capsule by mouth every morning.    . Omega-3 Fatty Acids (FISH OIL CONCENTRATE) 1000 MG CAPS Take 2 capsules by mouth 2 (two) times daily.     . potassium chloride SA (K-DUR,KLOR-CON) 20 MEQ tablet Take 1 tablet (20 mEq total) by mouth daily.   .  sertraline (ZOLOFT) 100 MG tablet Take 1 tablet (100 mg total) by mouth daily.   . vitamin B-12 (CYANOCOBALAMIN) 500 MCG tablet Take 500 mcg by mouth daily.   . warfarin (COUMADIN) 2.5 MG tablet Take 1 tablet daily except 1 1/2 tablets on Mondays and Thursdays (Patient taking differently: Take 3.75 mg by mouth every evening. ) 09/30/2016: 09/30/16 doses depends on lab values    No facility-administered encounter medications on file as of 11/11/2016.     Assessment:    Level of Care Concerns- Mrs. Normoyle continues to do well livingat home with her husband in [place] and prefers to continue to remain in her home with home health services and the assistance of [name]. [name], neighbor continues to visit Mrs. Rauth three times daily as per an arrangement made with the family. When CM offered assist with ADTS contact for personal care services Mrs Ganaway denied the need but [name] spoke with [facility] CM and wishes Mrs Booz would consider personal care services.  [name] states "She does not do well with change" [name] reports Mrs Firkus has not allowed   her to bath her in the last few days because of her noted muscle spasms.  [name] continues tohelp Mrs. Jule Economy daily and record. Target goal weight discussed by her providers is 115 lbs per [name].  Was last at 115 lbs on 10/17/16 Today's wt is 117.6 Mrs Lheureux and Lynelle Smoke continue to verbalize the importance of reporting a weight gain of 3-5 pounds or worsening of symptoms listed in the COPD/CHF zones listed in [facility] Calendar to her doctor.  The last call to report a weight gain was on 10/25/16 in which her lasix was increased to 40 mg BID x 3 days. Mrs Coburn continues to voice increase interest in eating  foods with increase salt content. She likes and has request salt not to be added but takes in french fries and fish sandwiches brought to her by her son "daily". [facility] CM again discussed the importance of not taking in  processed, fast foods or can foods with increase contents of salt.  Mrs Wolman voiced understanding but doe not wish to comply. [facility], [facility]. PT services have also concluded and Mrs Corti is using her walker without difficulty  Chronic Health Condition (CHF, COPD, DM)- Mrs Szeliga now prefers to continue to see Dr Danise Mina versus Dr Damita Dunnings.  Mrs Laningham still continues not to want to be seen by Dr Domenic Polite but has not heard back from the CV office after [facility] CM called the CV office while in the home to speak with staff to request again a change in Cardiologist from Dr Domenic Polite to "any other doctor in the office" per Mrs Metzinger's  Preference. Previous calls to change Cv provider resulted in CV provider not approving a change per Mrs Skeen's request. Mrs Appleman reports there return call does not matter because she "still don't to want to see him" She is scheduled to see her podiatrist, Gardiner Barefoot on 12/02/16 at 1400. [name] and Mrs Mattice have rescheduled a missed appointment with Dr Katy Fitch to assist with getting a hearing aid. For 11/30/16  Mrs Irani voiced that she had a "good" visit with Caroline Sauger, [place], at [facility] on 10/26/16 for counseling and will be returning for more visits.  Mrs Gradel has voice some issues with short term memory, anxiety and vivid dreams "that seem real"  [facility] CM encouraged Mrs Bayne to discuss these dreams with Bambi.  All of Mrs Mclaren appointments are managed by [name] with the [facility] provided during the last visit.  Mrs Kimbler states she has always been told that she has been told her diabetes "is borderline"  Acute medical conditions (back muscle spasms and tremors noticed more today in both hands related muscle spasms, some nasal congestion upon awaking in am) Mrs Gangwer's urinary retention, sensory change of her toes and tenderness under left  breast/shoulder area are resolved issues .   [facility] CM assessed Mrs Besse back pain and she reports it is related to her past medical history of compression fracture.  She has taken muscle relaxant with some relief.  She is scheduled to see her pcp soon for this concern and [facility] CM encouraged her to keep her appointment for evaluation and made a suggestion that she sleep in a bed or recliner versus her living room couch to assist with decreasing pain.  [facility] CM offered some suggestions for nasal congestion (vicks vapor rub, humidifier)  Plan:  [facility] CM provided Mrs Altman and Lynelle Smoke a copy of there DASH diet  and a 7 day sample of meal plans (breakfast, lunch and dinner)  [facility] CM offered to send ADTS information in the mail to Mrs Dara for review if she changes her mind about needing personal care services. [name] also would like to review the information with her  Routed [facility] CM note to medical providers  THN CM Care Plan Problem One     Most Recent Value  Care Plan Problem One  Level of Care Concerns and Care Coordination Needs  Role Documenting the Problem One  Care Management Coordinator  Care Plan for Problem One  Active  THN Long Term Goal (31-90 days)  Over the next 60 days, patient and family will verbalize understanding of patient care needs and options for assitance in the home and level of care needs and community resources  THN Long Term Goal Start Date  09/25/16  THN Long Term Goal Met Date  10/28/16  Interventions for Problem One Long Term Goal  Collaboration with patient/family/LCSW re: care needs and community resources  THN CM Short Term Goal #1 (0-30 days)  Over the next 7 days, patient/family will meet with critical risk nurse case manager re: current health conditions, educational needs, and level of care concerns  THN CM Short Term Goal #1 Start Date  09/25/16  THN CM Short Term Goal #1 Met Date  10/14/16  Interventions for Short Term Goal #1  scheduled home visit with  patient/family after discussion about provider referral and care concerns    THN CM Care Plan Problem Two     Most Recent Value  Care Plan Problem Two  Knowledge Deficits related to Chronic Health Condition (CHF) as evidenced by patient and caregiver questions about plan of care  Role Documenting the Problem Two  Care Management Coordinator  Care Plan for Problem Two  Active  Interventions for Problem Two Long Term Goal   Discussed need for additional knowledge related to plan of care for management of CHF,  scheduled home visit with patient/spouse/caregiver  THN Long Term Goal (31-90) days  Over the next 60 days, patient/family/caregiver will verbalize understanding of plan of care for management of CHF  THN Long Term Goal Start Date  09/28/16  THN CM Short Term Goal #1 (0-30 days)  Over the next 30 days, patient will weigh daily adn record and verbalize understanding of when to call for weight gain and rationale for doing so  THN CM Short Term Goal #1 Start Date  09/28/16  THN CM Short Term Goal #1 Met Date   10/28/16  Interventions for Short Term Goal #2   Discussed with patient and caregiver rationale and necessity of daily weight, recording, and reporting weight gain,  Emmi educational materials prescribed  THN CM Short Term Goal #2 (0-30 days)  Over the next 30 days, patient/spouse/caregiver will verbalize understanding of signs and symptoms of CHF and when to report  THN CM Short Term Goal #2 Start Date  09/28/16  THN CM Short Term Goal #2 Met Date  10/28/16  Interventions for Short Term Goal #2  Discussed signs and symptoms of early heart failure and when to report,  Emmi educational materials prescribed  THN CM Short Term Goal #3 (0-30 days)  Over the next 30 days, patient will attend provider appointment at scheduled  THN CM Short Term Goal #3 Start Date  09/28/16  THN CM Short Term Goal #3 Met Date  10/28/16  Interventions for Short Term Goal #3  reviewed upcoming pulmonary provider    appointment w/ patient/caregiver  THN CM Short Term Goal #4 (0-30 days)  Over the next 30 days the patient/cargiver and family will verbalize the importance of decreasing  salt containing foods to prevent edema of ankles and other symptoms of CHF   THN CM Short Term Goal #4 Start Date  10/28/16  Interventions of Short Term Goal #4  Review avoidance of fast foods, processed  and can foods that have increase salt content and lead to edema, review CHF low sodium diet        Kimberly L. Gibbs, RN, BSN, CCM [facility] Care Management [phone]      

## 2016-11-11 NOTE — Telephone Encounter (Signed)
A letter was received from [employer] stating [name] is not covered by patient health plan and is part of of a program called step therapy. JN choose Advair 250-50 diskus for step one medication. Order was placed for Advair 250-50 with instructions to take 1 puff two times a day. Nothing further is needed.

## 2016-11-12 ENCOUNTER — Ambulatory Visit: Payer: Medicare Other | Admitting: Family Medicine

## 2016-11-16 ENCOUNTER — Ambulatory Visit (INDEPENDENT_AMBULATORY_CARE_PROVIDER_SITE_OTHER): Payer: Medicare Other | Admitting: Family Medicine

## 2016-11-16 ENCOUNTER — Encounter: Payer: Self-pay | Admitting: Family Medicine

## 2016-11-16 VITALS — BP 124/70 | HR 78 | Temp 97.3°F | Wt 121.0 lb

## 2016-11-16 DIAGNOSIS — M810 Age-related osteoporosis without current pathological fracture: Secondary | ICD-10-CM

## 2016-11-16 DIAGNOSIS — M62830 Muscle spasm of back: Secondary | ICD-10-CM

## 2016-11-16 DIAGNOSIS — R413 Other amnesia: Secondary | ICD-10-CM | POA: Diagnosis not present

## 2016-11-16 MED ORDER — METHOCARBAMOL 500 MG PO TABS: 250.0000 mg | ORAL_TABLET | Freq: Two times a day (BID) | ORAL | 1 refills | Status: DC | PRN

## 2016-11-16 NOTE — Assessment & Plan Note (Addendum)
Ongoing. Will need to discuss increasing anticholinesterase inhibitor dose vs addition of namenda.  [name] caregiver neighbor checks on them several times a day.

## 2016-11-16 NOTE — Assessment & Plan Note (Signed)
boniva restarted 10/2016.

## 2016-11-16 NOTE — Patient Instructions (Addendum)
Make sure you're taking 1 potassium pill daily (54mEq daily).  Ok to continue robaxin - refilled today. Take 1/2 tablet if that is enough to help.  Do gentle stretches and chair exercises throughout the day.  Good to see you today. Let us know if not improving with this.

## 2016-11-16 NOTE — Progress Notes (Signed)
BP 124/70   Pulse 78   Temp 97.3 F (36.3 C) (Oral)   Wt 121 lb (54.9 kg)   SpO2 92%   BMI 22.13 kg/m    CC: back spasms Subjective:    Patient ID: [name], female    DOB: 12/06/1925, 83 y.o.   MRN: [id number]  HPI: VEONA [name] is a 83 y.o. female presenting on 11/16/2016 for Spasms (back)   Here with husband and son in law.  Seen here 2 wks ago with back spasms, seen by Rollene Fare and treated with robaxin 250mg  muscle relaxant which was very helpful. Ongoing bilateral mid back pain and spasms described as grabbing and sharp stabbing pains, may be improving some but present over last 2 months. Some intermittent nausea.   Denies fevers, chills, dysuria, abd pain, vomiting, diarrhea.  Denies paresthesias or shooting pain down legs.  She is also using lidocaine patch which is helpful.   Neighbor [name] helps out 3x/day. Helps with reminders, meetings, transportation.   boniva injection 10/29/2016.  Relevant past medical, surgical, family and social history reviewed and updated as indicated. Interim medical history since our last visit reviewed. Allergies and medications reviewed and updated. Outpatient Medications Prior to Visit  Medication Sig Dispense Refill  . acetaminophen (TYLENOL) 500 MG tablet Take 2 tablets (1,000 mg total) by mouth 3 (three) times daily.    . Blood Glucose Monitoring Suppl (FREESTYLE LITE) DEVI Check blood sugar once daily and as directed. Dx E11.9 90 each 1  . Calcium-Magnesium-Vitamin D [id number] MG-MG-UNIT TABS Take 1 tablet by mouth 2 (two) times daily.    Marland Kitchen donepezil (ARICEPT) 5 MG tablet Take 1 tablet (5 mg total) by mouth at bedtime. 30 tablet 3  . felodipine (PLENDIL) 2.5 MG 24 hr tablet TAKE 1 TABLET DAILY 90 tablet 1  . ferrous sulfate 325 (65 FE) MG tablet Take 325 mg by mouth daily with breakfast.    . fluticasone furoate-vilanterol (BREO ELLIPTA) 100-25 MCG/INH AEPB Inhale 1 puff into the lungs daily. 1 each 6  .  Fluticasone-Salmeterol (ADVAIR DISKUS) 250-50 MCG/DOSE AEPB Inhale 1 puff into the lungs 2 (two) times daily. 180 each 3  . furosemide (LASIX) 20 MG tablet Take 2 tablets (40 mg total) by mouth daily.    Marland Kitchen glucose blood (FREESTYLE LITE) test strip Check blood sugar once daily and as directed. Dx E11.9 100 each 1  . Lancets (FREESTYLE) lancets Check blood sugar once daily and as directed. Dx E11.9 100 each 1  . latanoprost (XALATAN) 0.005 % ophthalmic solution Place 1 drop into both eyes at bedtime.     Marland Kitchen levothyroxine (SYNTHROID, LEVOTHROID) 50 MCG tablet TAKE 1 TABLET DAILY 90 tablet 3  . lidocaine (LIDODERM) 5 % Place 1 patch onto the skin daily. Remove & Discard patch within 12 hours or as directed by MD 30 patch 6  . LORazepam (ATIVAN) 0.5 MG tablet Take 1 tablet (0.5 mg total) by mouth every 8 (eight) hours as needed for anxiety. 60 tablet 0  . Misc Natural Products (FIBER 7) POWD Take by mouth daily.    . Multiple Vitamin (MULTIVITAMIN) capsule Take 1 capsule by mouth every morning.     . Omega-3 Fatty Acids (FISH OIL CONCENTRATE) 1000 MG CAPS Take 2 capsules by mouth 2 (two) times daily.      . potassium chloride SA (K-DUR,KLOR-CON) 20 MEQ tablet Take 1 tablet (20 mEq total) by mouth daily. 30 tablet 3  . sertraline (ZOLOFT) 100 MG tablet  Take 1 tablet (100 mg total) by mouth daily. 90 tablet 1  . vitamin B-12 (CYANOCOBALAMIN) 500 MCG tablet Take 500 mcg by mouth daily.    Marland Kitchen warfarin (COUMADIN) 2.5 MG tablet Take 1 tablet daily except 1 1/2 tablets on Mondays and Thursdays (Patient taking differently: Take 3.75 mg by mouth every evening. ) 180 tablet 3  . methocarbamol (ROBAXIN) 500 MG tablet Take 0.5 tablets (250 mg total) by mouth 2 (two) times daily as needed for muscle spasms. 10 tablet 0   No facility-administered medications prior to visit.      Per HPI unless specifically indicated in ROS section below Review of Systems     Objective:    BP 124/70   Pulse 78   Temp 97.3 F  (36.3 C) (Oral)   Wt 121 lb (54.9 kg)   SpO2 92%   BMI 22.13 kg/m   Wt Readings from Last 3 Encounters:  11/16/16 121 lb (54.9 kg)  11/11/16 117 lb 9.6 oz (53.3 kg)  11/03/16 118 lb (53.5 kg)    Physical Exam  Constitutional: She appears well-developed and well-nourished. No distress.  Cardiovascular: Normal rate, regular rhythm, normal heart sounds and intact distal pulses.   No murmur heard. Pulmonary/Chest: Effort normal and breath sounds normal. No respiratory distress. She has no wheezes. She has no rales.  Musculoskeletal: She exhibits no edema.  No pain midline cervical, thoracic, lumbar spine No paraspinous mm tenderness FROM at shoulders No palpable tenderness to palpation along back   Nursing note and vitals reviewed.  Results for orders placed or performed in visit on 11/03/16  POCT Urinalysis Dipstick (Automated)  Result Value Ref Range   Color, UA yellow    Clarity, UA clear    Glucose, UA neg    Bilirubin, UA neg    Ketones, UA neg    Spec Grav, UA 1.015 1.010 - 1.025   Blood, UA neg    pH, UA 7.5 5.0 - 8.0   Protein, UA neg    Urobilinogen, UA negative (A) 0.2 or 1.0 E.U./dL   Nitrite, UA neg    Leukocytes, UA Small (1+) (A) Negative   Lab Results  Component Value Date   K 3.6 10/14/2016       Assessment & Plan:   Problem List Items Addressed This Visit    Back muscle spasm - Primary    Bilateral flank, ongoing over last several months. Robaxin muscle relaxant has been helpful and tolerated well - will continue this, refilled today (250mg  BID PRN).  Not consistent with UTI, with recurrent compression fracture. Pt to ensure taking potassium with her daily lasix. Update with effect, return if not continuing to improve over time.       Relevant Medications   methocarbamol (ROBAXIN) 500 MG tablet   Memory deficit    Ongoing. Will need to discuss increasing anticholinesterase inhibitor dose vs addition of namenda.  [name] caregiver neighbor checks  on them several times a day.      Osteoporosis    boniva restarted 10/2016.           Follow up plan: Return if symptoms worsen or fail to improve.  Ria Bush, MD

## 2016-11-16 NOTE — Assessment & Plan Note (Signed)
Bilateral flank, ongoing over last several months. Robaxin muscle relaxant has been helpful and tolerated well - will continue this, refilled today (250mg  BID PRN).  Not consistent with UTI, with recurrent compression fracture. Pt to ensure taking potassium with her daily lasix. Update with effect, return if not continuing to improve over time.

## 2016-11-23 ENCOUNTER — Encounter: Payer: Medicare Other | Admitting: Cardiology

## 2016-11-23 ENCOUNTER — Ambulatory Visit (INDEPENDENT_AMBULATORY_CARE_PROVIDER_SITE_OTHER): Payer: Medicare Other | Admitting: *Deleted

## 2016-11-23 DIAGNOSIS — Z5181 Encounter for therapeutic drug level monitoring: Secondary | ICD-10-CM

## 2016-11-23 DIAGNOSIS — G458 Other transient cerebral ischemic attacks and related syndromes: Secondary | ICD-10-CM | POA: Diagnosis not present

## 2016-11-23 DIAGNOSIS — I481 Persistent atrial fibrillation: Secondary | ICD-10-CM | POA: Diagnosis not present

## 2016-11-23 DIAGNOSIS — I4819 Other persistent atrial fibrillation: Secondary | ICD-10-CM

## 2016-11-23 LAB — POCT INR: INR: 2

## 2016-11-25 ENCOUNTER — Telehealth: Payer: Self-pay

## 2016-11-25 ENCOUNTER — Other Ambulatory Visit: Payer: Self-pay | Admitting: *Deleted

## 2016-11-25 NOTE — Telephone Encounter (Signed)
Kimberly case mgr with [facility] left v/m requesting verbal order for [facility] home health PT due to muscle spasms in back.

## 2016-11-25 NOTE — Patient Outreach (Signed)
[name]) Care Management   11/25/2016  [name] 04/10/1926 [id number]  [name] is an 83 y.o. female [name] is an 83 y.o. female  with medical history significant of atrial fibrillation, CHF, HTN, hypothyroidism, DM Type II, GERD, and nonspecific lung disease with worsening shortness of breath for 1 week prior to her inpatient Admission on 09/11/16. She was discharged to home with home health RN and PT visits which have stopped  Valerie Montgomerywas referred to [facility] for CHF Disease Management and care coordination and level of care concerns. This is there third home visit to [name] since she was referred to [facility] program in March 2018. She presents as usually to be sitting up in a chair  Subjective:  " My back is hurting" " I am having spasms"  Objective:   BP 110/72   Pulse 74   Temp (!) 96.7 F (35.9 C) (Oral)   Resp 20   SpO2 97%  Review of Systems  Constitutional: Positive for malaise/fatigue. Negative for chills, diaphoresis, fever and weight loss.  HENT: Negative for congestion, ear discharge, ear pain, hearing loss, nosebleeds, sinus pain, sore throat and tinnitus.   Eyes: Negative for blurred vision, double vision, photophobia, pain, discharge and redness.  Respiratory: Positive for shortness of breath. Negative for cough, hemoptysis, sputum production, wheezing and stridor.   Cardiovascular: Negative for chest pain, palpitations, orthopnea, claudication, leg swelling and PND.  Gastrointestinal: Negative for abdominal pain, blood in stool, constipation, diarrhea, heartburn, melena, nausea and vomiting.  Genitourinary: Negative for dysuria, flank pain, frequency, hematuria and urgency.  Musculoskeletal: Positive for back pain. Negative for falls, joint pain, myalgias and neck pain.  Skin: Negative for itching and rash.  Neurological: Negative for dizziness, tingling, tremors, sensory change, speech  change, focal weakness, seizures, loss of consciousness, weakness and headaches.  Endo/Heme/Allergies: Negative for environmental allergies and polydipsia. Bruises/bleeds easily.  Psychiatric/Behavioral: Negative for depression, hallucinations, memory loss, substance abuse and suicidal ideas. The patient is not nervous/anxious and does not have insomnia.     Physical Exam  Constitutional: She is oriented to person, place, and time. She appears well-developed and well-nourished.  HENT:  Head: Normocephalic and atraumatic.  Eyes: Conjunctivae are normal. Pupils are equal, round, and reactive to light.  Neck: Normal range of motion. Neck supple.  Cardiovascular: Normal rate and regular rhythm.   Respiratory: Breath sounds normal.  GI: Soft. Bowel sounds are normal.  Genitourinary: Vagina normal and uterus normal.  Musculoskeletal: Normal range of motion.  Neurological: She is alert and oriented to person, place, and time.  Skin: Skin is dry.  Psychiatric: She has a normal mood and affect. Her behavior is normal. Judgment and thought content normal.    Encounter Medications:   Outpatient Encounter Prescriptions as of 11/25/2016  Medication Sig Note  . acetaminophen (TYLENOL) 500 MG tablet Take 2 tablets (1,000 mg total) by mouth 3 (three) times daily. 09/30/2016: Now prn only  . Blood Glucose Monitoring Suppl (FREESTYLE LITE) DEVI Check blood sugar once daily and as directed. Dx E11.9   . Calcium-Magnesium-Vitamin D [phone] MG-MG-UNIT TABS Take 1 tablet by mouth 2 (two) times daily.   Marland Kitchen donepezil (ARICEPT) 5 MG tablet Take 1 tablet (5 mg total) by mouth at bedtime.   . felodipine (PLENDIL) 2.5 MG 24 hr tablet TAKE 1 TABLET DAILY   . ferrous sulfate 325 (65 FE) MG tablet Take 325 mg by mouth daily with breakfast.   .  fluticasone furoate-vilanterol (BREO ELLIPTA) 100-25 MCG/INH AEPB Inhale 1 puff into the lungs daily.   . Fluticasone-Salmeterol (ADVAIR DISKUS) 250-50 MCG/DOSE AEPB Inhale 1  puff into the lungs 2 (two) times daily.   . furosemide (LASIX) 20 MG tablet Take 2 tablets (40 mg total) by mouth daily.   Marland Kitchen glucose blood (FREESTYLE LITE) test strip Check blood sugar once daily and as directed. Dx E11.9   . Lancets (FREESTYLE) lancets Check blood sugar once daily and as directed. Dx E11.9   . latanoprost (XALATAN) 0.005 % ophthalmic solution Place 1 drop into both eyes at bedtime.    Marland Kitchen levothyroxine (SYNTHROID, LEVOTHROID) 50 MCG tablet TAKE 1 TABLET DAILY   . lidocaine (LIDODERM) 5 % Place 1 patch onto the skin daily. Remove & Discard patch within 12 hours or as directed by MD 10/28/2016: Has not used in a long time - use as needed  . LORazepam (ATIVAN) 0.5 MG tablet Take 1 tablet (0.5 mg total) by mouth every 8 (eight) hours as needed for anxiety. 10/28/2016: Use as needed  last time taken last week x 1 "rarely"   . methocarbamol (ROBAXIN) 500 MG tablet Take 0.5 tablets (250 mg total) by mouth 2 (two) times daily as needed for muscle spasms.   . Misc Natural Products (FIBER 7) POWD Take by mouth daily.   . Multiple Vitamin (MULTIVITAMIN) capsule Take 1 capsule by mouth every morning.    . Omega-3 Fatty Acids (FISH OIL CONCENTRATE) 1000 MG CAPS Take 2 capsules by mouth 2 (two) times daily.     . potassium chloride SA (K-DUR,KLOR-CON) 20 MEQ tablet Take 1 tablet (20 mEq total) by mouth daily.   . sertraline (ZOLOFT) 100 MG tablet Take 1 tablet (100 mg total) by mouth daily.   . vitamin B-12 (CYANOCOBALAMIN) 500 MCG tablet Take 500 mcg by mouth daily.   Marland Kitchen warfarin (COUMADIN) 2.5 MG tablet Take 1 tablet daily except 1 1/2 tablets on Mondays and Thursdays (Patient taking differently: Take 3.75 mg by mouth every evening. ) 09/30/2016: 09/30/16 doses depends on lab values    No facility-administered encounter medications on file as of 11/25/2016.     Functional Status:   In your present state of health, do you have any difficulty performing the following activities: 10/29/2016 09/24/2016   Hearing? N Y  Vision? N N  Difficulty concentrating or making decisions? N N  Walking or climbing stairs? N Y  Dressing or bathing? N Y  Doing errands, shopping? - Y  Preparing Food and eating ? - Y  Using the Toilet? - N  In the past six months, have you accidently leaked urine? - N  Do you have problems with loss of bowel control? - N  Managing your Medications? - Y  Managing your Finances? - Y  Housekeeping or managing your Housekeeping? - Y  Some recent data might be hidden    Fall/Depression Screening:    Fall Risk  11/11/2016 10/14/2016 09/24/2016  Falls in the past year? No No No  Number falls in past yr: - - -  Injury with Fall? - - -  Risk for fall due to : - Impaired balance/gait;Impaired mobility;Impaired vision;Medication side effect Impaired mobility;Impaired balance/gait  Risk for fall due to (comments): - - -   PHQ 2/9 Scores 09/24/2016 09/02/2016 08/08/2015 04/26/2014 04/20/2013 03/29/2012  PHQ - 2 Score 2 3 0 2 4 1   PHQ- 9 Score 8 19 - 8 14 -    Assessment:  Acute Medical Conditions: Back pain on scale of 8 CM spent most of visit providing a massage to ease pt pain enough to complete assessment  Chronic Medical Conditions: COPD, CHF, DM, Back pain  Continue to manage issues with medications. Has an upcoming appointment with Dr Ashok Cordia  Medications/DMEA hospital bed has been obtained for the pt from [place] Today she is visited by a family member from [place], her step son and her husband is present Lynelle Smoke is not present but [facility] CM has left a message for [name] without a return call   Referrals- community ADTS contacted to request referral for personal care providers  Home health Left message for pcp office to restart HHPT and inquired about a TENS unit.    Future medical appointments: Ashok Cordia 12/03/16   Plan: follow up 1- 2 weeks for home visit     [place]. Lavina Hamman, RN, BSN, [place] [facility] [phone]

## 2016-11-25 NOTE — Telephone Encounter (Signed)
Ok to do this. Thank you.  

## 2016-11-26 NOTE — Telephone Encounter (Signed)
Spoke with Joelene Millin at [facility].  They need a written order and demographics faxed to [facility] at [phone].

## 2016-11-27 ENCOUNTER — Other Ambulatory Visit: Payer: Self-pay | Admitting: Licensed Clinical Social Worker

## 2016-11-27 NOTE — Patient Outreach (Signed)
Assessment:  CSW spoke via phone with client. CSW verified client identity. CSW received verbal permission from client on 11/27/16 for CSW to communicate with client about current client needs and status.  Client sees Dr. Danise Mina as primary care doctor.  Client has support from her spouse and from her son.  Client has prescribed medications and is taking medications as prescribed.  Client had been receiving home health services as ordered with [facility]. Client has now completed home health nursing services with [facility]. Her primary doctor has reordered home health physical therapy services for client with [facility] services.  Client uses a walker to help her ambulate.  Client said that her family helps in transporting her to and from her scheduled medical appointments. [name], a neighbor of client, visits client daily several times.  [name] also provides client support in going to and from appointments with client, helps client with getting meals on time and helps client maintain a daily schedule.   CSW and client spoke of client care plan.  CSW encouraged client to participate in scheduled client in home physical therapy sessions for client in next 30 days with [facility] services. CSW encouraged client to attend all scheduled client medical appointments. CSW encouraged client to call RN Jackelyn Poling as needed to discuss nursing needs of client. CSW encouraged [name] to call CSW at [phone] as needed to discuss social work needs of client. Client said that counseling appointment she attended a few weeks ago was helpful to client. She spoke of having some back spasms when she is in a sitting position. She has talked several times with her primary care doctor about back spasms of client. She is taking medications as prescribed from her primary care doctor. Client was appreciative of call from [place] on 11/27/16.   Plan:  Client to participate in scheduled client in home physical  therapy sessions for client in next [facility] days [facility].   CSW to collaborate with RN Jackelyn Poling as needed in monitoring needs of client.  CSW to call client in 4 weeks to assess client needs.   Norva Riffle.Alfonza Toft MSW, LCSW Licensed Clinical Social Worker [facility] Care Management [phone].

## 2016-11-27 NOTE — Telephone Encounter (Signed)
Order and demographic sheet faxed to [facility] [phone].

## 2016-11-27 NOTE — Telephone Encounter (Signed)
Rx written and in CMA box 

## 2016-11-30 ENCOUNTER — Ambulatory Visit (INDEPENDENT_AMBULATORY_CARE_PROVIDER_SITE_OTHER): Payer: Medicare Other | Admitting: Otolaryngology

## 2016-11-30 DIAGNOSIS — H6121 Impacted cerumen, right ear: Secondary | ICD-10-CM

## 2016-11-30 DIAGNOSIS — H903 Sensorineural hearing loss, bilateral: Secondary | ICD-10-CM | POA: Diagnosis not present

## 2016-12-01 ENCOUNTER — Telehealth: Payer: Self-pay

## 2016-12-01 NOTE — Telephone Encounter (Signed)
Ok to start thursday

## 2016-12-01 NOTE — Telephone Encounter (Signed)
Keisa notified okay to start [facility] PT on Thursday

## 2016-12-01 NOTE — Telephone Encounter (Signed)
Jessi.Kub nurse with [facility] left v/m; there is a delay in starting pts HH PT; [facility] can go out to start care on 12/03/16. Is that OK with Dr Darnell Level? Keisa request cb.

## 2016-12-02 ENCOUNTER — Ambulatory Visit: Payer: Medicare Other | Admitting: Podiatry

## 2016-12-03 ENCOUNTER — Ambulatory Visit (INDEPENDENT_AMBULATORY_CARE_PROVIDER_SITE_OTHER): Payer: Medicare Other | Admitting: Pulmonary Disease

## 2016-12-03 DIAGNOSIS — J449 Chronic obstructive pulmonary disease, unspecified: Secondary | ICD-10-CM

## 2016-12-03 DIAGNOSIS — J9611 Chronic respiratory failure with hypoxia: Secondary | ICD-10-CM

## 2016-12-03 LAB — PULMONARY FUNCTION TEST
DL/VA % PRED: 49 %
DL/VA: 2.3 ml/min/mmHg/L
DLCO cor % pred: 28 %
DLCO cor: 6.53 ml/min/mmHg
DLCO unc % pred: 27 %
DLCO unc: 6.25 ml/min/mmHg
FEF 25-75 Post: 1.37 L/sec
FEF 25-75 Pre: 1.35 L/sec
FEF2575-%CHANGE-POST: 1 %
FEF2575-%PRED-POST: 158 %
FEF2575-%Pred-Pre: 157 %
FEV1-%Change-Post: 0 %
FEV1-%PRED-PRE: 97 %
FEV1-%Pred-Post: 97 %
FEV1-Post: 1.48 L
FEV1-Pre: 1.47 L
FEV1FVC-%CHANGE-POST: 0 %
FEV1FVC-%Pred-Pre: 109 %
FEV6-%Change-Post: 0 %
FEV6-%PRED-PRE: 96 %
FEV6-%Pred-Post: 96 %
FEV6-PRE: 1.85 L
FEV6-Post: 1.85 L
FEV6FVC-%Change-Post: 0 %
FEV6FVC-%PRED-PRE: 106 %
FEV6FVC-%Pred-Post: 105 %
FVC-%Change-Post: 0 %
FVC-%PRED-POST: 91 %
FVC-%PRED-PRE: 90 %
FVC-POST: 1.88 L
FVC-PRE: 1.87 L
POST FEV6/FVC RATIO: 98 %
Post FEV1/FVC ratio: 78 %
Pre FEV1/FVC ratio: 79 %
Pre FEV6/FVC Ratio: 99 %
RV % PRED: 22 %
RV: 0.58 L
TLC % pred: 61 %
TLC: 3.02 L

## 2016-12-03 NOTE — Progress Notes (Signed)
PFT done today. 

## 2016-12-04 ENCOUNTER — Ambulatory Visit (INDEPENDENT_AMBULATORY_CARE_PROVIDER_SITE_OTHER): Payer: Medicare Other | Admitting: Pulmonary Disease

## 2016-12-04 ENCOUNTER — Encounter: Payer: Self-pay | Admitting: Pulmonary Disease

## 2016-12-04 ENCOUNTER — Ambulatory Visit (INDEPENDENT_AMBULATORY_CARE_PROVIDER_SITE_OTHER): Payer: Medicare Other | Admitting: *Deleted

## 2016-12-04 VITALS — BP 130/90 | HR 82 | Ht 62.0 in | Wt 118.0 lb

## 2016-12-04 DIAGNOSIS — Z9981 Dependence on supplemental oxygen: Secondary | ICD-10-CM

## 2016-12-04 DIAGNOSIS — J90 Pleural effusion, not elsewhere classified: Secondary | ICD-10-CM | POA: Diagnosis not present

## 2016-12-04 DIAGNOSIS — J9611 Chronic respiratory failure with hypoxia: Secondary | ICD-10-CM | POA: Diagnosis not present

## 2016-12-04 DIAGNOSIS — J984 Other disorders of lung: Secondary | ICD-10-CM | POA: Insufficient documentation

## 2016-12-04 NOTE — Progress Notes (Signed)
Subjective:    Patient ID: [name], female    DOB: 18-May-1926, 83 y.o.   MRN: [id number]  C.C.:  Mild COPD, Chronic Hypoxic Respiratory Failure, & Bilateral Pleural Effusions.   HPI Mild COPD: Previously prescribed Anoro but had some blurry vision so she was switched over to [place]. She has been using Breo & Advair simultaneous. No evidence of obstruction on spirometry today or significant bronchodilator response. She denie any coughing or wheezing. She isn't sure if her breathing has been doing better on the [place] or not.   Chronic Hypoxic Respiratory Failure: Patient continues to demonstrate a 2 L/m oxygen requirement at rest and 3 L/m oxygen requirement with exertion today.  Bilateral Pleural Effusions: Likely secondary to underlying diastolic congestive heart failure. Remains small and repeat x-ray imaging. No plan for thoracentesis.  Review of Systems No chest tightness or pressure. She reports infrequent chest pain. No abdominal pain, nausea, or diarrhea.   Allergies  Allergen Reactions  . Propoxyphene N-Acetaminophen Shortness Of Breath  . Codeine Nausea And Vomiting  . Meperidine Hcl Nausea And Vomiting  . Morphine Nausea And Vomiting  . Shellfish Allergy Hives  . Tramadol Nausea Only    Current Outpatient Prescriptions on File Prior to Visit  Medication Sig Dispense Refill  . acetaminophen (TYLENOL) 500 MG tablet Take 2 tablets (1,000 mg total) by mouth 3 (three) times daily.    . Blood Glucose Monitoring Suppl (FREESTYLE LITE) DEVI Check blood sugar once daily and as directed. Dx E11.9 90 each 1  . Calcium-Magnesium-Vitamin D [phone] MG-MG-UNIT TABS Take 1 tablet by mouth 2 (two) times daily.    Marland Kitchen donepezil (ARICEPT) 5 MG tablet Take 1 tablet (5 mg total) by mouth at bedtime. 30 tablet 3  . felodipine (PLENDIL) 2.5 MG 24 hr tablet TAKE 1 TABLET DAILY 90 tablet 1  . ferrous sulfate 325 (65 FE) MG tablet Take 325 mg by mouth daily with breakfast.    . fluticasone  furoate-vilanterol (BREO ELLIPTA) 100-25 MCG/INH AEPB Inhale 1 puff into the lungs daily. 1 each 6  . Fluticasone-Salmeterol (ADVAIR DISKUS) 250-50 MCG/DOSE AEPB Inhale 1 puff into the lungs 2 (two) times daily. 180 each 3  . furosemide (LASIX) 20 MG tablet Take 2 tablets (40 mg total) by mouth daily.    Marland Kitchen glucose blood (FREESTYLE LITE) test strip Check blood sugar once daily and as directed. Dx E11.9 100 each 1  . Lancets (FREESTYLE) lancets Check blood sugar once daily and as directed. Dx E11.9 100 each 1  . latanoprost (XALATAN) 0.005 % ophthalmic solution Place 1 drop into both eyes at bedtime.     Marland Kitchen levothyroxine (SYNTHROID, LEVOTHROID) 50 MCG tablet TAKE 1 TABLET DAILY 90 tablet 3  . lidocaine (LIDODERM) 5 % Place 1 patch onto the skin daily. Remove & Discard patch within 12 hours or as directed by MD 30 patch 6  . LORazepam (ATIVAN) 0.5 MG tablet Take 1 tablet (0.5 mg total) by mouth every 8 (eight) hours as needed for anxiety. 60 tablet 0  . methocarbamol (ROBAXIN) 500 MG tablet Take 0.5 tablets (250 mg total) by mouth 2 (two) times daily as needed for muscle spasms. 30 tablet 1  . Misc Natural Products (FIBER 7) POWD Take by mouth daily.    . Multiple Vitamin (MULTIVITAMIN) capsule Take 1 capsule by mouth every morning.     . Omega-3 Fatty Acids (FISH OIL CONCENTRATE) 1000 MG CAPS Take 2 capsules by mouth 2 (two) times daily.      Marland Kitchen  potassium chloride SA (K-DUR,KLOR-CON) 20 MEQ tablet Take 1 tablet (20 mEq total) by mouth daily. 30 tablet 3  . sertraline (ZOLOFT) 100 MG tablet Take 1 tablet (100 mg total) by mouth daily. 90 tablet 1  . vitamin B-12 (CYANOCOBALAMIN) 500 MCG tablet Take 500 mcg by mouth daily.    Marland Kitchen warfarin (COUMADIN) 2.5 MG tablet Take 1 tablet daily except 1 1/2 tablets on Mondays and Thursdays (Patient taking differently: Take 3.75 mg by mouth every evening. ) 180 tablet 3  . [DISCONTINUED] Calcium Carbonate-Vitamin D (CALCIUM PLUS VITAMIN D PO) Take 2 tablets by mouth  daily.       No current facility-administered medications on file prior to visit.     Past Medical History:  Diagnosis Date  . Anemia   . Anxiety   . Aortic sclerosis   . Arthritis   . CHF (congestive heart failure) ([place])   . Chronic atrial fibrillation ([place])   . Chronic gastritis 10/2009   Duodenitis on EGD  . CKD (chronic kidney disease) stage 2, GFR 60-89 ml/min   . Collagen vascular disease ([place])   . COPD (chronic obstructive pulmonary disease) ([place])   . Depression   . Diabetes type 2, controlled ([place])    Diet controlled  . Diverticulosis   . Dry ARMD    Retinal hemorrhage (09/2013) [place]  . Essential hypertension, benign   . GERD (gastroesophageal reflux disease)   . Glaucoma    Dr. Venetia Maxon  . Hiatal hernia 2011   Small  . History of pelvic fracture April 2013  . History of rheumatic fever   . History of skin cancer   . History of TIA (transient ischemic attack)   . Hypothyroidism   . Macular degeneration   . Mixed hyperlipidemia   . Osteoporosis 04/2013   Thoracic compression fracture, on bisphosphonate and cal/vit D  . Oxygen deficiency   . Pulmonary nodules    Stable bilateral on CT 01/2010, rec rpt yearly for 2 yrs, pt decided to stop f/u  . Seasonal allergies   . Vertebral compression fracture ([place]) 05/2016   Vertebroplasty L2/L3    Past Surgical History:  Procedure Laterality Date  . BREAST LUMPECTOMY  1983   LEFT, benign  . Carotid US  2011   mild plaque formation  . CATARACT EXTRACTION  2012   RIGHT  . CESAREAN SECTION     (and h/o 6 miscarriages)  . CHOLECYSTECTOMY N/A 02/27/2013   Procedure: LAPAROSCOPIC CHOLECYSTECTOMY;  Surgeon: Donato Heinz, MD;  Location: [facility];  Service: General;  Laterality: N/A;  . COLONOSCOPY  03/2010   int hemorrhoids, diverticulosis, no need to repeat (Dr. Sydell Axon)  . DEXA  05/2010   T score -4.5 spine, -2.4 femur; does not want to repeat  . DEXA  04/2013   T score 3.9 [facility] spine, 2.6 hip  . DILATION AND CURETTAGE  OF UTERUS     x2  . ESOPHAGOGASTRODUODENOSCOPY  10/2009   small HH, multiple antric ulcerations s/p biopsy, mild chronic gastritis/duodenitis  . ESOPHAGOGASTRODUODENOSCOPY  05/26/2012   RMR: small HH, o/w normal.   . IR GENERIC HISTORICAL  04/16/2016   IR RADIOLOGIST EVAL & MGMT 04/16/2016 [facility]  . IR GENERIC HISTORICAL  06/16/2016   IR VERTEBROPLASTY LUMBAR BX INC UNI/BIL INC/INJECT/IMAGING 06/16/2016 Luanne Bras, MD [facility]  . IR GENERIC HISTORICAL  06/16/2016   IR VERTEBROPLASTY EA ADDL (T&LS) BX INC UNI/BIL INC INJECT/IMAGING 06/16/2016 Luanne Bras, MD [facility]  . LAPAROSCOPIC CHOLECYSTECTOMY  02/2013   Dr. Geroge Baseman  . PATELLA FRACTURE SURGERY  05/2006   LEFT surgically repaired with pins and wire  . TONSILLECTOMY      Family History  Problem Relation Age of Onset  . Coronary artery disease Mother 46       MI deceased  . Colon cancer Father 24  . Asthma Father   . Asthma Sister   . Stroke Paternal Grandmother   . Diabetes Neg Hx     Social History   Social History  . Marital status: Married    Spouse name: N/A  . Number of children: N/A  . Years of education: N/A   Occupational History  . Retired     Former Therapist, sports   Social History Main Topics  . Smoking status: Former Smoker    Packs/day: 1.00    Years: 30.00    Types: Cigarettes    Quit date: 10/13/1998  . Smokeless tobacco: Never Used  . Alcohol use No  . Drug use: No  . Sexual activity: No   Other Topics Concern  . 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      [facility] (10/13/16):   Originally from [place]. Moved to the [place] at 83 y.o. She has lived in [place] and moved to [place] in 1963. Retired Therapist, sports. No known  TB exposure. Has dogs currently. No bird or mold exposure.          Objective:   Physical Exam BP 130/90 (BP Location: Right Arm, Patient Position: Sitting, Cuff Size: Normal)   Pulse 82   Ht 5\' 2"  (1.575 m)   Wt 118 lb (53.5 kg)   SpO2 96%   BMI 21.58 kg/m   Gen.: Elderly female. No distress. Son with her today. Integument: Warm. Dry. No rash. Cardiovascular: Regular rate. No edema. Unable to appreciate JVD with body positioning. Abdomen: Soft. Mildly protuberant. Normal bowel sounds. Pulmonary: Very minimal basilar crackles in the bases bilaterally. Otherwise good aeration. Normal work of breathing on supplemental oxygen. HEENT: Moist membranes. No scleral icterus or injection. Neurological: Cranial nerves grossly intact. No meningismus.  PFT 12/03/16: FVC 1.87 L (90%) FEV1 1.47 L (97%) FEV1/FVC 0.79 FEF 25-75 1.35 L (157%) negative bronchodilator response TLC 3.02 L (61%) RV 22% DLCO corrected 28% 11/28/15: FVC 2.00 L (73%) FEV1 1.39 L (73%) FEV1/FVC 0.69 FEF 25-75 0.84 L (69%)  6MWT 12/04/16:  Walked 69 meters (with walker) / Baseline Sat 79% on Room Air / Nadir Sat 88% on 2 L/m @ 3:22 (required 2 L/m at rest & 3 L/m with exertion on continuous flow to maintain)  IMAGING CXR PA/LAT 10/13/16 (personally reviewed by me):  No parenchymal opacity or mass appreciated. Prominent interstitial markings bilaterally. Tiny bilateral pleural effusions. Mediastinum normal in contour. Mild cardiomegaly. Low lung volumes.  CT CHEST W/O 09/14/16 (previously reviewed by me): Mild compression deformity of upper endplate T10 & Z61 vertebral bodies.  Moderate compression deformity L1 vertebral body. prior vertebroplasty L2 & L3. No parenchymal opacity appreciated. Compressive atelectasis from small bilateral pleural effusions. Subcarinal lymph node measuring approximately 1.2 cm short axis by my review.  CARDIAC TTE (09/12/16): Moderate LVH  with EF 60-65%. Unable to assess diastolic function due to  atrial fibrillation. No regional wall motion abnormalities. LA & RA severely dilated. RV moderately dilated with moderate to severe reduction in systolic function. No aortic stenosis or regurgitation. Aortic root normal in size. Mild mitral regurgitation without stenosis. No pulmonic stenosis or regurgitation. Moderate tricuspid regurgitation without stenosis. Large left pleural effusion. Trivial circumferential pericardial effusion.  LABS 10/14/16 Alpha-1 antitrypsin: MM (196) BMP: 141/3.6/102/32/13/0.66/119/9.6  09/11/16 BNP:  723 (previously 364 on 07/26/16) Troponin I:  <0.03    Assessment & Plan:  83 y.o. female with mild COPD, chronic hypoxic respiratory failure, & bilateral pleural effusions. X-ray imaging continues to show tiny bilateral pleural effusions likely from her diastolic congestive heart failure. Walk test today continues to demonstrate any significant oxygen requirement both at rest and with exertion. Pulmonary function testing shows no evidence of airway obstruction or significant bronchodilator response but does demonstrate a mild restriction as well as severely reduced carbon monoxide diffusion capacity likely secondary to her underlying diastolic congestive heart failure. I instructed the patient to notify me if she had any new breathing problems or questions before her next appointment.  1. Mild COPD: Not evident on spirometry today. Trial of inhaler holiday. If the patient experiences new symptoms she will restart Breo & notify my office for prescription. 2. Moderate restrictive lung disease: Likely secondary to cardiomegaly and diastolic congestive heart failure. No further testing needed.  3. Chronic hypoxic respiratory failure: Continuing patient on oxygen at 2 L/m at rest with 3 L/m on exertion. 4. Bilateral pleural effusions: Remaining tiny. No indication for thoracentesis at this time. 5. Health maintenance: Status post influenza vaccine September 2017, Prevnar October  2015, Pneumovax January 2000, &  Tdap May 2015. 6. Follow-up: Return to clinic in 6 months or sooner if needed.  Sonia Baller Ashok Cordia, M.D. [facility] Pulmonary & Critical Care Pager:  [phone] After 3pm or if no response, call [phone] 11:52 AM 12/04/16

## 2016-12-04 NOTE — Progress Notes (Signed)
SIX MIN WALK 12/04/2016 10/13/2016  Medications pt is unsure what medications she has taken today -  Supplimental Oxygen during Test? (L/min) Yes No  O2 Flow Rate 3 -  Type Continuous -  Laps 1 -  Partial Lap (in Meters) 21 -  Baseline BP (sitting) 120/60 -  Baseline Heartrate 96 -  Baseline Dyspnea (Borg Scale) 3 -  Baseline Fatigue (Borg Scale) 3 -  Baseline SPO2 79 -  BP (sitting) 126/64 -  Heartrate 94 -  Dyspnea (Borg Scale) 4 -  Fatigue (Borg Scale) 4 -  SPO2 96 -  BP (sitting) 120/68 -  Heartrate 91 -  SPO2 99 -  Stopped or Paused before Six Minutes Yes -  Other Symptoms at end of Exercise to increased liter flow from 2 to 3L -  Interpretation Hip pain -  Distance Completed 69 -  Tech Comments: Test performed with forehead probe. pt completed test at slow pace with walker. pt c/o hip and back pain but states his is normal with this amount of exertion. test was started on 2L. test was paused with 3:22 at 39 meters to increase liter flow to 3, as O2 dropped to 88.  Pt walked from waiting area to scale to room and oxygen dropped. She was placed on her two liters and O2 rose to 90

## 2016-12-04 NOTE — Patient Instructions (Addendum)
   Try stopping the Advair & Breo inhalers to see if you notice any new breathing problems:  Coughing, Wheezing, or Shortness of breath that is getting worse.  If your breathing gets worse then restart the Breo inhaler and call us to get a prescription sent to the right place.  Use your oxygen at 2 L/m at rest and 3 L/m when you are exerting yourself.  Call me if you have any questions or breathing problems.

## 2016-12-07 ENCOUNTER — Encounter: Payer: Self-pay | Admitting: Family Medicine

## 2016-12-07 ENCOUNTER — Ambulatory Visit (INDEPENDENT_AMBULATORY_CARE_PROVIDER_SITE_OTHER): Payer: Medicare Other | Admitting: Psychology

## 2016-12-07 DIAGNOSIS — F331 Major depressive disorder, recurrent, moderate: Secondary | ICD-10-CM | POA: Diagnosis not present

## 2016-12-09 ENCOUNTER — Encounter: Payer: Self-pay | Admitting: Podiatry

## 2016-12-09 ENCOUNTER — Ambulatory Visit (INDEPENDENT_AMBULATORY_CARE_PROVIDER_SITE_OTHER): Payer: Medicare Other | Admitting: Podiatry

## 2016-12-09 ENCOUNTER — Telehealth: Payer: Self-pay

## 2016-12-09 ENCOUNTER — Other Ambulatory Visit: Payer: Self-pay | Admitting: *Deleted

## 2016-12-09 VITALS — BP 117/69 | HR 76

## 2016-12-09 DIAGNOSIS — M79676 Pain in unspecified toe(s): Secondary | ICD-10-CM

## 2016-12-09 DIAGNOSIS — B351 Tinea unguium: Secondary | ICD-10-CM | POA: Diagnosis not present

## 2016-12-09 NOTE — Telephone Encounter (Signed)
May try TENS unit. plz call [facility] - how do we set this up - do they need Rx written or can I just request TENS through [facility]?

## 2016-12-09 NOTE — Telephone Encounter (Signed)
Kimberly case mgr with [facility] left v/m; pt is requesting a TENS unit for back pain; also some confusion about pt getting started on home health PT thru [facility]; the PT, Almyra Free who saw pt thought PCP had changed but Joelene Millin said that was an error; Dr Danise Mina is still PCP for pt. [facility] will be contacting Dr Danise Mina about [facility] PT. Joelene Millin does not need cb.

## 2016-12-09 NOTE — Progress Notes (Addendum)
Complaint:  Visit Type: Patient returns to my office for continued preventative foot care services. Complaint: Patient states" my nails have grown long and thick and become painful to walk and wear shoes" . The patient presents for preventative foot care services. No changes to ROS  Podiatric Exam: Vascular: dorsalis pedis and posterior tibial pulses are palpable bilateral. Capillary return is immediate. Temperature gradient is WNL. Skin turgor WNL  Sensorium: Normal Semmes Weinstein monofilament test. Normal tactile sensation bilaterally. Nail Exam: Pt has thick disfigured discolored nails with subungual debris noted bilateral entire nail hallux through fifth toenails Ulcer Exam: There is no evidence of ulcer or pre-ulcerative changes or infection. Orthopedic Exam: Muscle tone and strength are WNL. No limitations in general ROM. No crepitus or effusions noted. Foot type and digits show no abnormalities. Bony prominences are unremarkable. Skin: No Porokeratosis. No infection or ulcers  Diagnosis:  Onychomycosis, , Pain in right toe, pain in left toes  Treatment & Plan Procedures and Treatment: Consent by patient was obtained for treatment procedures. The patient understood the discussion of treatment and procedures well. All questions were answered thoroughly reviewed. Debridement of mycotic and hypertrophic toenails, 1 through 5 bilateral and clearing of subungual debris. No ulceration, no infection noted.  Return Visit-Office Procedure: Patient instructed to return to the office for a follow up visit 3 months   for continued evaluation and treatment.    Seng Fouts DPM 

## 2016-12-10 NOTE — Telephone Encounter (Signed)
I called [facility] and was told that they don't offer TENS units at all, please advise.

## 2016-12-10 NOTE — Telephone Encounter (Signed)
I can write Rx for TENS unit (in [facility] box) but am not sure how to get it - would suggest they go to DME store and inquire about filling.  Would also ask them to touch base with [facility] about TENS unit if they get it (from below message it sounds like pt was to restart [facility]).

## 2016-12-11 NOTE — Telephone Encounter (Signed)
Spoke to [facility] and advised per Dr Darnell Level; orders placed at the front desk for pickup

## 2016-12-11 NOTE — Patient Outreach (Signed)
[facility]) Care Management   12/09/2016  [name] [birth year] [id number]  [name] is an 83 y.o. female  with medical history significant of atrial fibrillation, CHF, HTN, hypothyroidism, DM Type II, GERD, and nonspecific lung disease with worsening shortness of breath for 1 week prior to her inpatient Admission on 09/11/16. She was discharged to home with home health RN and PT visits which have stopped  Valerie Montgomerywas referred to [facility] for CHF Disease Management and care coordination and level of care concerns. This is there third home visit to [name] since she was referred to [facility] program in March 2018.  She presents as usually to be sitting up in a chair [name] lies asleep on a couch near her hospital bed .    Subjective: " I took my panic pill last night and I feel a little drunk right now" "Things have to be written down for me to remember"  Objective:   BP 110/76   Pulse 79   Temp 97.3 F (36.3 C) (Oral)   Resp 20   Wt 119 lb 12.8 oz (54.3 kg)   SpO2 96%   BMI 21.91 kg/m   Review of Systems  Constitutional: Negative for chills, diaphoresis, fever, malaise/fatigue and weight loss.  HENT: Positive for hearing loss. Negative for congestion, ear discharge, ear pain, nosebleeds, sinus pain, sore throat and tinnitus.   Eyes: Negative.  Negative for blurred vision, double vision, photophobia, pain, discharge and redness.  Respiratory: Negative.  Negative for cough, hemoptysis, sputum production, shortness of breath, wheezing and stridor.   Cardiovascular: Negative.  Negative for chest pain, palpitations, orthopnea, claudication, leg swelling and PND.  Gastrointestinal: Negative.  Negative for abdominal pain, blood in stool, constipation, diarrhea, heartburn, melena, nausea and vomiting.  Genitourinary: Negative.  Negative for dysuria, flank pain, frequency, hematuria and urgency.  Musculoskeletal: Positive  for back pain. Negative for falls, joint pain, myalgias and neck pain.  Skin: Negative for itching and rash.  Neurological: Positive for weakness. Negative for dizziness, tingling, tremors, sensory change, speech change, focal weakness, seizures, loss of consciousness and headaches.  Endo/Heme/Allergies: Negative for environmental allergies and polydipsia. Bruises/bleeds easily.  Psychiatric/Behavioral: Positive for memory loss. Negative for depression, hallucinations, substance abuse and suicidal ideas. The patient is not nervous/anxious and does not have insomnia.     Physical Exam  Constitutional: She is oriented to person, place, and time. She appears well-developed and well-nourished.  HENT:  Head: Normocephalic.  Eyes: Pupils are equal, round, and reactive to light.  Neck: Normal range of motion.  Cardiovascular: Normal rate, regular rhythm and normal heart sounds.   Respiratory: Effort normal and breath sounds normal.  GI: Soft. She exhibits distension.  Musculoskeletal: Normal range of motion.  Neurological: She is alert and oriented to person, place, and time.  Skin: Skin is warm and dry.  Psychiatric: She has a normal mood and affect. Her behavior is normal. Thought content normal.    Encounter Medications:   Outpatient Encounter Prescriptions as of 12/09/2016  Medication Sig Note  . acetaminophen (TYLENOL) 500 MG tablet Take 2 tablets (1,000 mg total) by mouth 3 (three) times daily. 09/30/2016: Now prn only  . Blood Glucose Monitoring Suppl (FREESTYLE LITE) DEVI Check blood sugar once daily and as directed. Dx E11.9   . Calcium-Magnesium-Vitamin D [phone] MG-MG-UNIT TABS Take 1 tablet by mouth 2 (two) times daily.   Marland Kitchen donepezil (ARICEPT) 5 MG tablet Take 1 tablet (5 mg total)  by mouth at bedtime.   . felodipine (PLENDIL) 2.5 MG 24 hr tablet TAKE 1 TABLET DAILY   . ferrous sulfate 325 (65 FE) MG tablet Take 325 mg by mouth daily with breakfast.   . fluticasone  furoate-vilanterol (BREO ELLIPTA) 100-25 MCG/INH AEPB Inhale 1 puff into the lungs daily.   . Fluticasone-Salmeterol (ADVAIR DISKUS) 250-50 MCG/DOSE AEPB Inhale 1 puff into the lungs 2 (two) times daily.   . furosemide (LASIX) 20 MG tablet Take 2 tablets (40 mg total) by mouth daily.   Marland Kitchen glucose blood (FREESTYLE LITE) test strip Check blood sugar once daily and as directed. Dx E11.9   . Lancets (FREESTYLE) lancets Check blood sugar once daily and as directed. Dx E11.9   . latanoprost (XALATAN) 0.005 % ophthalmic solution Place 1 drop into both eyes at bedtime.    Marland Kitchen levothyroxine (SYNTHROID, LEVOTHROID) 50 MCG tablet TAKE 1 TABLET DAILY   . lidocaine (LIDODERM) 5 % Place 1 patch onto the skin daily. Remove & Discard patch within 12 hours or as directed by MD 10/28/2016: Has not used in a long time - use as needed  . LORazepam (ATIVAN) 0.5 MG tablet Take 1 tablet (0.5 mg total) by mouth every 8 (eight) hours as needed for anxiety. 10/28/2016: Use as needed  last time taken last week x 1 "rarely"   . methocarbamol (ROBAXIN) 500 MG tablet Take 0.5 tablets (250 mg total) by mouth 2 (two) times daily as needed for muscle spasms.   . Misc Natural Products (FIBER 7) POWD Take by mouth daily.   . Multiple Vitamin (MULTIVITAMIN) capsule Take 1 capsule by mouth every morning.    . Omega-3 Fatty Acids (FISH OIL CONCENTRATE) 1000 MG CAPS Take 2 capsules by mouth 2 (two) times daily.     . potassium chloride SA (K-DUR,KLOR-CON) 20 MEQ tablet Take 1 tablet (20 mEq total) by mouth daily.   . sertraline (ZOLOFT) 100 MG tablet Take 1 tablet (100 mg total) by mouth daily.   . vitamin B-12 (CYANOCOBALAMIN) 500 MCG tablet Take 500 mcg by mouth daily.   Marland Kitchen warfarin (COUMADIN) 2.5 MG tablet Take 1 tablet daily except 1 1/2 tablets on Mondays and Thursdays (Patient taking differently: Take 3.75 mg by mouth every evening. ) 09/30/2016: 09/30/16 doses depends on lab values    No facility-administered encounter medications on  file as of 12/09/2016.     Functional Status:   In your present state of health, do you have any difficulty performing the following activities: 10/29/2016 09/24/2016  Hearing? N Y  Vision? N N  Difficulty concentrating or making decisions? N N  Walking or climbing stairs? N Y  Dressing or bathing? N Y  Doing errands, shopping? - Y  Preparing Food and eating ? - Y  Using the Toilet? - N  In the past six months, have you accidently leaked urine? - N  Do you have problems with loss of bowel control? - N  Managing your Medications? - Y  Managing your Finances? - Y  Housekeeping or managing your Housekeeping? - Y  Some recent data might be hidden    Fall/Depression Screening:    Fall Risk  11/11/2016 10/14/2016 09/24/2016  Falls in the past year? No No No  Number falls in past yr: - - -  Injury with Fall? - - -  Risk for fall due to : - Impaired balance/gait;Impaired mobility;Impaired vision;Medication side effect Impaired mobility;Impaired balance/gait  Risk for fall due to (comments): - - -  PHQ 2/9 Scores 09/24/2016 09/02/2016 08/08/2015 04/26/2014 04/20/2013 03/29/2012  PHQ - 2 Score 2 3 0 '2 4 1  ' PHQ- 9 Score 8 19 - 8 14 -    Assessment:   presents today siting on a straight back chair in her living room awaiting [facility] CM visit. Her husband was asleep on the couch and her step son visited, cooked and left while [facility] CM was present.  Her relative from [place] nor [place] , care giver, were not present She still has not contacted or allowed [facility] staff to offer services. [facility] CM noted in last 2 visits that [name] is not present   Acute Medical Conditions: [name] states she still has back pain and a massager was not obtained but confirms pain less than during previous visits.   She reports having a visit from "Joey" for HHPT on 12/03/16 but he thought her pcp was no longer Dr Danise Mina and informed her he would not be able to work with her. [name] stats "He looked like he was glad he  did not have to be here"  Chronic Medical Conditions: COPD, CHF, HTN, DM, back pain  Medical conditions managed with medications. Prefers not to change diet preferences. Continues use of oxygen at 2 L at rest and 3 L with activity  Last BM ws on Tuesday 12/08/16  Last wts were, 116.4-119 lbs She is able to discuss not having weight > 3 lbs over her baseline She reports using incentive spirometer, nebulizer,Vicks, Tums and hydrocortisone cream as needed (all these are at her bedside)  [name] continues to sleep on her new hospital bed in her living room   Medications/DME All her inhalers were discontinued by Dr Ashok Cordia on 12/04/16 and she reports " I can't tell the difference since I stopped using them"   [facility]errals- [facility]  Future medical appointments: Today she is scheduled to see her foot doctor at 2:45 pm and [name] would be assisting with taking her to appt   Plan: follow up 1- 2 weeks for home visit [facility] to route note to pcp and care team members  [facility] CM spoke with Rena, Triage RN at pcp office  [facility] CM left copies of 2 types of back massagers and when asked her husband son to assist with purchase, CM was informed by [name] that he does not assist in her care [facility] CM called home health agency to discuss confusion with pcp status, clarified Dr Danise Mina is pcp and [name] asked if another HHPT staff could visit pt.  [name] states she would send "annette" and [name] voiced appreciation of new HHPT staff [facility] CM Care Plan Problem One     Most Recent Value  Care Plan Problem One  (P) Level of Care Concerns and Care Coordination Needs  Role Documenting the Problem One  (P) Care Management [place] for Problem One  (P) Active  [facility] Long Term Goal   (P) Over the next 60 days, patient and family will verbalize understanding of patient care needs and options for assitance in the home and level of care needs and community resources  [facility] Long Term Goal  Start Date  (P) 09/25/16  [facility] Long Term Goal Met Date  (P) 10/28/16  Interventions for Problem One Long Term Goal  (P) Collaboration with patient/family/LCSW re: care needs and community resources  [facility] CM Short Term Goal #1   (P) Over the next 7 days, patient/family will meet with critical risk  nurse case manager re: current health conditions, educational needs, and level of care concerns  [facility] CM Short Term Goal #1 Start Date  (P) 09/25/16  [facility] CM Short Term Goal #1 Met Date  (P) 10/14/16  Interventions for Short Term Goal #1  (P) scheduled home visit with patient/family after discussion about provider referral and care concerns  [facility] CM Short Term Goal #2   (P) Client will participatre in all scheduled client in home physical therpay sessions for client in next 30 days  [facility] CM Short Term Goal #2 Start Date  (P) 12/09/16  Interventions for Short Term Goal #2  (P) re establish goal, called to re start HHPT session/[facility]   [facility] CM Short Term Goal #3  (P) Client will participate in all scheduled clieint in home physical therapy sessions for client in next  30 days with [facility] Term Goal #3 Start Date  (P) 11/27/16  Interventions for Short Tern Goal #3  (P) On 11/27/16, CSW encouraged client to participate in all scheduled client in home physical therpay sessions for client in next 30 days with [facility].    [facility] CM Care Plan Problem Two     Most Recent Value  Care Plan Problem Two  (P) Knowledge Deficits related to Chronic Health Condition (CHF) as evidenced by patient and caregiver questions about plan of care  Role Documenting the Problem Two  (P) Care Management Coordinator  Care Plan for Problem Two  (P) Active  Interventions for Problem Two Long Term Goal   (P) Discussed need for additional knowledge related to plan of care for management of CHF,  scheduled home visit with patient/spouse/caregiver  [facility] Long Term Goal  (P) Over the next 60  days, patient/family/caregiver will verbalize understanding of plan of care for management of CHF  [facility] Long Term Goal Start Date  (P) 09/28/16  [facility] Long Term Goal Met Date  (P) 12/09/16  [facility] CM Short Term Goal #1   (P) Over the next 30 days, patient will weigh daily adn record and verbalize understanding of when to call for weight gain and rationale for doing so  [facility] CM Short Term Goal #1 Start Date  (P) 09/28/16  [facility] CM Short Term Goal #1 Met Date   (P) 10/28/16  Interventions for Short Term Goal #2   (P) Discussed with patient and caregiver rationale and necessity of daily weight, recording, and reporting weight gain,  Emmi educational materials prescribed  [facility] CM Short Term Goal #2   (P) Over the next 30 days, patient/spouse/caregiver will verbalize understanding of signs and symptoms of CHF and when to report  [facility] CM Short Term Goal #2 Start Date  (P) 09/28/16  [facility] CM Short Term Goal #2 Met Date  (P) 10/28/16  Interventions for Short Term Goal #2  (P) Discussed signs and symptoms of early heart failure and when to report,  Emmi educational materials prescribed  [facility] CM Short Term Goal #3   (P) Over the next 30 days, patient will attend provider appointment at scheduled  [facility] CM Short Term Goal #3 Start Date  (P) 09/28/16  [facility] CM Short Term Goal #3 Met Date  (P) 10/28/16  Interventions for Short Term Goal #3  (P) reviewed upcoming pulmonary provider appointment w/ patient/caregiver  [facility] CM Short Term Goal #4  (P) Over the next 30 days the patient/cargiver and family will verbalize the importance of decreasing  salt containing  foods to prevent edema of ankles and other symptoms of CHF   [facility] CM Short Term Goal #4 Start Date  (P) 10/28/16  Interventions of Short Term Goal #4  (P) Review avoidance of fast foods, processed  and can foods that have increase salt content and lead to edema, review CHF low sodium diet        Kimberly L. Lavina Hamman, RN, BSN, [facility] [phone]

## 2016-12-16 ENCOUNTER — Telehealth: Payer: Self-pay | Admitting: Family Medicine

## 2016-12-16 NOTE — Telephone Encounter (Signed)
Can reach [place] at [phone]

## 2016-12-16 NOTE — Telephone Encounter (Signed)
[name] from [facility] called stating they will send Physical Therapist out to pt's home Friday 6/1 to start home health services.

## 2016-12-17 ENCOUNTER — Encounter: Payer: Self-pay | Admitting: Family Medicine

## 2016-12-17 ENCOUNTER — Ambulatory Visit (INDEPENDENT_AMBULATORY_CARE_PROVIDER_SITE_OTHER): Payer: Medicare Other | Admitting: Psychology

## 2016-12-17 ENCOUNTER — Ambulatory Visit (INDEPENDENT_AMBULATORY_CARE_PROVIDER_SITE_OTHER): Payer: Medicare Other | Admitting: Family Medicine

## 2016-12-17 ENCOUNTER — Telehealth: Payer: Self-pay

## 2016-12-17 VITALS — BP 124/70 | HR 88 | Temp 97.6°F | Wt 117.8 lb

## 2016-12-17 DIAGNOSIS — J9611 Chronic respiratory failure with hypoxia: Secondary | ICD-10-CM

## 2016-12-17 DIAGNOSIS — F331 Major depressive disorder, recurrent, moderate: Secondary | ICD-10-CM

## 2016-12-17 DIAGNOSIS — J449 Chronic obstructive pulmonary disease, unspecified: Secondary | ICD-10-CM

## 2016-12-17 DIAGNOSIS — I5033 Acute on chronic diastolic (congestive) heart failure: Secondary | ICD-10-CM

## 2016-12-17 DIAGNOSIS — F418 Other specified anxiety disorders: Secondary | ICD-10-CM | POA: Diagnosis not present

## 2016-12-17 DIAGNOSIS — M62830 Muscle spasm of back: Secondary | ICD-10-CM | POA: Diagnosis not present

## 2016-12-17 DIAGNOSIS — Z9981 Dependence on supplemental oxygen: Secondary | ICD-10-CM

## 2016-12-17 DIAGNOSIS — I5032 Chronic diastolic (congestive) heart failure: Secondary | ICD-10-CM | POA: Diagnosis not present

## 2016-12-17 MED ORDER — FUROSEMIDE 40 MG PO TABS: ORAL_TABLET | ORAL | 6 refills | Status: AC

## 2016-12-17 NOTE — Progress Notes (Signed)
BP 124/70   Pulse 88   Temp 97.6 F (36.4 C) (Oral)   Wt 117 lb 12 oz (53.4 kg)   SpO2 (!) 88%   BMI 21.54 kg/m    CC: 1 mo f/u visit Subjective:    Patient ID: [name], female    DOB: 1926/04/19, 83 y.o.   MRN: [id number]  HPI: [name] is a 83 y.o. female presenting on 12/17/2016 for Follow-up   Recent [facility] notes reviewed.  Here with son today.  TENS unit ordered last week. HHPT services to start 12/18/2016.  Back spasms have mostly resolved. She has started using back massager which has been very helpful for her chronic back pain. She has stopped using muscle relaxant, continues tylenol and lidocaine patches. Overall stable period for back.   Today noticed increased dyspnea. Denies cough, congestion, wheezing, chest pain. Saw Dr Ashok Cordia pulm recently who stopped respiratory medications.   Continues lasix 40mg  daily, last few days BID due to increased leg swelling. Brings log of weights over the past week - slowly increasing from 119 to 12 lbs3. Drinking 48 oz water daily.   Neighbor [name] helps out 3x/day. Helps with reminders, meetings, transportation.  She recently established with Bambi our counselor - son notes this has significantly helped her mood and "attitude".   Relevant past medical, surgical, family and social history reviewed and updated as indicated. Interim medical history since our last visit reviewed. Allergies and medications reviewed and updated. Outpatient Medications Prior to Visit  Medication Sig Dispense Refill  . acetaminophen (TYLENOL) 500 MG tablet Take 2 tablets (1,000 mg total) by mouth 3 (three) times daily.    . Blood Glucose Monitoring Suppl (FREESTYLE LITE) DEVI Check blood sugar once daily and as directed. Dx E11.9 90 each 1  . Calcium-Magnesium-Vitamin D [phone] MG-MG-UNIT TABS Take 1 tablet by mouth 2 (two) times daily.    Marland Kitchen donepezil (ARICEPT) 5 MG tablet Take 1 tablet (5 mg total) by mouth at bedtime. 30 tablet 3  .  felodipine (PLENDIL) 2.5 MG 24 hr tablet TAKE 1 TABLET DAILY 90 tablet 1  . ferrous sulfate 325 (65 FE) MG tablet Take 325 mg by mouth daily with breakfast.    . fluticasone furoate-vilanterol (BREO ELLIPTA) 100-25 MCG/INH AEPB Inhale 1 puff into the lungs daily. (Patient not taking: Reported on 12/17/2016) 1 each 6  . Fluticasone-Salmeterol (ADVAIR DISKUS) 250-50 MCG/DOSE AEPB Inhale 1 puff into the lungs 2 (two) times daily. (Patient not taking: Reported on 12/17/2016) 180 each 3  . glucose blood (FREESTYLE LITE) test strip Check blood sugar once daily and as directed. Dx E11.9 100 each 1  . Lancets (FREESTYLE) lancets Check blood sugar once daily and as directed. Dx E11.9 100 each 1  . latanoprost (XALATAN) 0.005 % ophthalmic solution Place 1 drop into both eyes at bedtime.     Marland Kitchen levothyroxine (SYNTHROID, LEVOTHROID) 50 MCG tablet TAKE 1 TABLET DAILY 90 tablet 3  . lidocaine (LIDODERM) 5 % Place 1 patch onto the skin daily. Remove & Discard patch within 12 hours or as directed by MD 30 patch 6  . LORazepam (ATIVAN) 0.5 MG tablet Take 1 tablet (0.5 mg total) by mouth every 8 (eight) hours as needed for anxiety. 60 tablet 0  . Misc Natural Products (FIBER 7) POWD Take by mouth daily.    . Multiple Vitamin (MULTIVITAMIN) capsule Take 1 capsule by mouth every morning.     . Omega-3 Fatty Acids (FISH OIL CONCENTRATE) 1000  MG CAPS Take 2 capsules by mouth 2 (two) times daily.      . potassium chloride SA (K-DUR,KLOR-CON) 20 MEQ tablet Take 1 tablet (20 mEq total) by mouth daily. 30 tablet 3  . sertraline (ZOLOFT) 100 MG tablet Take 1 tablet (100 mg total) by mouth daily. 90 tablet 1  . vitamin B-12 (CYANOCOBALAMIN) 500 MCG tablet Take 500 mcg by mouth daily.    Marland Kitchen warfarin (COUMADIN) 2.5 MG tablet Take 1 tablet daily except 1 1/2 tablets on Mondays and Thursdays (Patient taking differently: Take 3.75 mg by mouth every evening. ) 180 tablet 3  . furosemide (LASIX) 20 MG tablet Take 2 tablets (40 mg total)  by mouth daily.    . methocarbamol (ROBAXIN) 500 MG tablet Take 0.5 tablets (250 mg total) by mouth 2 (two) times daily as needed for muscle spasms. 30 tablet 1   No facility-administered medications prior to visit.      Per HPI unless specifically indicated in ROS section below Review of Systems     Objective:    BP 124/70   Pulse 88   Temp 97.6 F (36.4 C) (Oral)   Wt 117 lb 12 oz (53.4 kg)   SpO2 (!) 88%   BMI 21.54 kg/m   Wt Readings from Last 3 Encounters:  12/17/16 117 lb 12 oz (53.4 kg)  12/09/16 119 lb 12.8 oz (54.3 kg)  12/04/16 118 lb (53.5 kg)    Physical Exam  Constitutional: She appears well-developed and well-nourished. No distress.  HENT:  Mouth/Throat: Oropharynx is clear and moist. No oropharyngeal exudate.  Cardiovascular: Normal rate, regular rhythm, normal heart sounds and intact distal pulses.   No murmur heard. Pulmonary/Chest: Effort normal and breath sounds normal. No respiratory distress. She has no wheezes. She has no rales.  Bibasilar crackles  Musculoskeletal: She exhibits edema (1+ bilateral lower extremities).  Skin: Skin is warm and dry. No rash noted.  Psychiatric: She has a normal mood and affect.  Nursing note and vitals reviewed.  Lab Results  Component Value Date   CREATININE 0.66 10/14/2016       Assessment & Plan:  Seeing Bambi counselor after today's visit Problem List Items Addressed This Visit    Acute on chronic diastolic heart failure ([place]) - Primary    Evidenced by increased oxygen requirement, pedal edema, weight gain from home log.  No wheezing or other signs of COPD exacerbation.  Will treat with temporary increase in lasix to 80mg  in am and 40mg  in early afternoon, update [place] with effect early next week. Pt/son agree with plan.       Relevant Medications   furosemide (LASIX) 40 MG tablet   Back muscle spasm    This has improved, now off muscle relaxant. May continue TENS, massage, lidocaine patch and tylenol.        Chronic diastolic CHF (congestive heart failure) (HCC)    We have increased lasix to 80mg /40mg  dose, I have asked her to update me with effect early next week.       Relevant Medications   furosemide (LASIX) 40 MG tablet   Chronic respiratory failure with hypoxia, on home oxygen therapy (HCC)    Acute worsening noted, attributed to acute CHF exacerbation today - see below. I did ask her to increase oxygen use to 3L at baseline, 4L with exertion at least until we treat mild hypervolemia.       COPD, mild ([place])    Appreciate pulm care. I don't think current  increased oxygen requirement is related to COPD exacerbation but rather CHF - did not restart breo.       Depression with anxiety    Continue sertraline 100mg  daily, lorazepam 0.5mg  PRN (rarely). She has established with counselor Bambi and has found this very helpful. Has f/u later today.           Follow up plan: Return in about 6 weeks (around 01/28/2017) for follow up visit.  Ria Bush, MD

## 2016-12-17 NOTE — Assessment & Plan Note (Addendum)
Acute worsening noted, attributed to acute CHF exacerbation today - see below. I did ask her to increase oxygen use to 3L at baseline, 4L with exertion at least until we treat mild hypervolemia.

## 2016-12-17 NOTE — Telephone Encounter (Signed)
Melissa clinical mgr with [facility] left v/m requesting H & P and last office note faxed to her at [phone]. Faxed as requested.

## 2016-12-17 NOTE — Assessment & Plan Note (Signed)
We have increased lasix to 80mg /40mg  dose, I have asked her to update me with effect early next week.

## 2016-12-17 NOTE — Assessment & Plan Note (Signed)
Continue sertraline 100mg  daily, lorazepam 0.5mg  PRN (rarely). She has established with counselor Bambi and has found this very helpful. Has f/u later today.

## 2016-12-17 NOTE — Assessment & Plan Note (Signed)
Appreciate pulm care. I don't think current increased oxygen requirement is related to COPD exacerbation but rather CHF - did not restart breo.

## 2016-12-17 NOTE — Telephone Encounter (Signed)
Spoke with Darlina Guys and provided verbal orders as requested

## 2016-12-17 NOTE — Patient Instructions (Addendum)
I think low oxygen is coming from congestive heart failure exacerbation. Increase lasix to 80mg  in the morning and 40mg  in the afternoon. Take first 80mg  lasix dose today when you get home.  Call us on Monday with an update on weight and water pill effect. No inhalers for now. If worsening shortness of breath or any chest pain, seek care at hospital.  [facility] to stay off muscle relaxant for now, continue massager and try TENS unit (PT coming out tomorrow).  Return in 4-6 weeks for follow up visit.

## 2016-12-17 NOTE — Telephone Encounter (Signed)
Agree with this. Thanks.  

## 2016-12-17 NOTE — Assessment & Plan Note (Signed)
This has improved, now off muscle relaxant. May continue TENS, massage, lidocaine patch and tylenol.

## 2016-12-17 NOTE — Assessment & Plan Note (Addendum)
Evidenced by increased oxygen requirement, pedal edema, weight gain from home log.  No wheezing or other signs of COPD exacerbation.  Will treat with temporary increase in lasix to 80mg  in am and 40mg  in early afternoon, update [place] with effect early next week. Pt/son agree with plan.

## 2016-12-18 ENCOUNTER — Telehealth: Payer: Self-pay

## 2016-12-18 ENCOUNTER — Ambulatory Visit: Payer: Self-pay | Admitting: Family Medicine

## 2016-12-18 DIAGNOSIS — M62830 Muscle spasm of back: Secondary | ICD-10-CM | POA: Diagnosis not present

## 2016-12-18 DIAGNOSIS — E119 Type 2 diabetes mellitus without complications: Secondary | ICD-10-CM | POA: Diagnosis not present

## 2016-12-18 DIAGNOSIS — J449 Chronic obstructive pulmonary disease, unspecified: Secondary | ICD-10-CM | POA: Diagnosis not present

## 2016-12-18 DIAGNOSIS — I50810 Right heart failure, unspecified: Secondary | ICD-10-CM | POA: Diagnosis not present

## 2016-12-18 DIAGNOSIS — I5032 Chronic diastolic (congestive) heart failure: Secondary | ICD-10-CM | POA: Diagnosis not present

## 2016-12-18 DIAGNOSIS — I110 Hypertensive heart disease with heart failure: Secondary | ICD-10-CM | POA: Diagnosis not present

## 2016-12-18 MED ORDER — POTASSIUM CHLORIDE CRYS ER 20 MEQ PO TBCR: 20.0000 meq | EXTENDED_RELEASE_TABLET | Freq: Two times a day (BID) | ORAL | 3 refills | Status: DC

## 2016-12-18 NOTE — Telephone Encounter (Signed)
[name] (DPR signed) left v/m; pt seen 12/17/16 and lasix was increased to taking 80 mg in AM and 40 mg in afternoon. Pt presently taking K 20 meq daily. Does pt need to increase K? Tamara request cb. [facility] [place].

## 2016-12-18 NOTE — Telephone Encounter (Signed)
Yes - if she can tolerate, let's try to increase to BID. New Rx sent to pharmacy.

## 2016-12-21 ENCOUNTER — Ambulatory Visit (INDEPENDENT_AMBULATORY_CARE_PROVIDER_SITE_OTHER): Payer: Medicare Other | Admitting: *Deleted

## 2016-12-21 ENCOUNTER — Telehealth: Payer: Self-pay

## 2016-12-21 DIAGNOSIS — Z5181 Encounter for therapeutic drug level monitoring: Secondary | ICD-10-CM | POA: Diagnosis not present

## 2016-12-21 DIAGNOSIS — I4819 Other persistent atrial fibrillation: Secondary | ICD-10-CM

## 2016-12-21 DIAGNOSIS — I481 Persistent atrial fibrillation: Secondary | ICD-10-CM

## 2016-12-21 DIAGNOSIS — G458 Other transient cerebral ischemic attacks and related syndromes: Secondary | ICD-10-CM | POA: Diagnosis not present

## 2016-12-21 LAB — POCT INR: INR: 2.6

## 2016-12-21 NOTE — Telephone Encounter (Signed)
Patient's caretaker,Tamara,said she was returning a call.  If she's not available, a detailed message can be left on her voice mail.  Phone [phone].

## 2016-12-21 NOTE — Telephone Encounter (Signed)
Lm on [name]'s vm requesting a call back. Should she contact office back and someone not be available to speak with her, please confirm if it is ok to leave a detailed message with Dr [name] instruction

## 2016-12-21 NOTE — Telephone Encounter (Signed)
[name] returned your call

## 2016-12-21 NOTE — Telephone Encounter (Signed)
Recommend continue new lasix dose 80mg  in am and 40mg  in early afternoon, K-dur 35mEq BID.  How is shortness of breath doing? If persistent, we will increase lasix to 80mg  BID.

## 2016-12-21 NOTE — Telephone Encounter (Signed)
Lm on [name]'s vm requesting a call back

## 2016-12-21 NOTE — Telephone Encounter (Signed)
[name] (DPR signed) left v/m; pt was seen 12/17/16 and Lasix increased, calling back with pts wts. 12/18/16 Wt 124 12/19/16 Wt 121.6 12/20/16 Wt 120.6 12/21/16 Wt 120.8  [name] request cb with whether to continue increased dose of Lasix or what to do?

## 2016-12-22 ENCOUNTER — Other Ambulatory Visit: Payer: Self-pay | Admitting: *Deleted

## 2016-12-22 DIAGNOSIS — J449 Chronic obstructive pulmonary disease, unspecified: Secondary | ICD-10-CM | POA: Diagnosis not present

## 2016-12-22 DIAGNOSIS — E119 Type 2 diabetes mellitus without complications: Secondary | ICD-10-CM | POA: Diagnosis not present

## 2016-12-22 DIAGNOSIS — I110 Hypertensive heart disease with heart failure: Secondary | ICD-10-CM | POA: Diagnosis not present

## 2016-12-22 DIAGNOSIS — M62830 Muscle spasm of back: Secondary | ICD-10-CM | POA: Diagnosis not present

## 2016-12-22 DIAGNOSIS — I50810 Right heart failure, unspecified: Secondary | ICD-10-CM | POA: Diagnosis not present

## 2016-12-22 DIAGNOSIS — I5032 Chronic diastolic (congestive) heart failure: Secondary | ICD-10-CM | POA: Diagnosis not present

## 2016-12-22 NOTE — Patient Outreach (Signed)
[facility]) Care Management  12/22/2016  TOI STELLY [birth year] [id number]   [facility] CM spoke with pt after she arrive home from having labs completed  She reports she is tired and [facility] CM will see her this week at an agreed upon earlier time  Institute L. Lavina Hamman, RN, BSN, [place] Care Management [phone]

## 2016-12-22 NOTE — Telephone Encounter (Signed)
[name] notified as instructed by telephone and verbalized understanding. [name] stated that she will pick the new script for potassium up at the pharmacy today. (See other phone note) [name] stated that patient's SOB is much better and that her oxygen level is running between 88-96 on oxygen depending on her activities.  [name] stated that her weight this morning was 121.6 and yesterday was 120.6.

## 2016-12-22 NOTE — Telephone Encounter (Signed)
[name] notified as instructed by telephone and stated that she will pick up the new script at the pharmacy today. See other phone note.

## 2016-12-23 ENCOUNTER — Telehealth: Payer: Self-pay

## 2016-12-23 ENCOUNTER — Other Ambulatory Visit: Payer: Self-pay | Admitting: *Deleted

## 2016-12-23 NOTE — Telephone Encounter (Signed)
Debbie PT with [facility] left v/m requesting verbal HH PT orders 1 x a week for 1 week and 2 x a week for 6 weeks for strengthening and monitoring oxygen.

## 2016-12-23 NOTE — Patient Outreach (Signed)
[facility]) Care Management   12/23/2016  [name] Apr 21, 1926 [id number]  XARA PAULDING is an 83 y.o. female with medical history significant of atrial fibrillation, CHF, HTN, hypothyroidism, DM Type II, GERD, and nonspecific lung disease with worsening shortness of breath for 1 week prior to her inpatient Admission on 09/11/16. She was discharged to home with home health RN and PT visits which had stopped until PT visits restarted on 12/18/16 with a new PT staff member, Furnish after complaints of back pain and mobility issues.   Valerie Montgomerywas referred to [place] Management for CHF Disease Management and care coordination and level of care concerns. [name] does not prefer a level of care change other than home.  This is the sixth home visit to [name] since she was referred to [facility] program in March 2018.   Subjective:  "You came at the right time." " I feel good today" "my appetite has decreased"  Objective:   BP 110/70   Pulse 84   Temp (!) 96.9 F (36.1 C) (Oral)   Resp (!) 22   Ht 1.575 m (5\' 2" )   Wt 119 lb (54 kg)   SpO2 95%   BMI 21.77 kg/m   Review of Systems  Constitutional: Negative for chills, diaphoresis, fever, malaise/fatigue and weight loss.  HENT: Positive for hearing loss. Negative for congestion, ear discharge, ear pain, nosebleeds, sinus pain, sore throat and tinnitus.   Eyes: Negative for blurred vision, double vision, photophobia, pain, discharge and redness.  Respiratory: Positive for shortness of breath. Negative for cough, hemoptysis, sputum production, wheezing and stridor.   Cardiovascular: Positive for leg swelling. Negative for chest pain, palpitations, orthopnea, claudication and PND.  Gastrointestinal: Positive for nausea. Negative for abdominal pain, blood in stool, constipation, diarrhea, heartburn, melena and vomiting.       BM every 1-2 days, abdomen distended but denies pain, decreased  appetite, getting fuller quicker, gas  Genitourinary: Negative for dysuria, flank pain, frequency, hematuria and urgency.  Musculoskeletal: Positive for back pain. Negative for falls, joint pain, myalgias and neck pain.       Minimal back pain since restarted HHPT, and back massager  Skin: Negative for itching and rash.  Neurological: Positive for dizziness, weakness and headaches. Negative for tingling, tremors, sensory change, speech change, focal weakness and loss of consciousness.       Seldom spinning feeling Headache no last longer especially when standing  Endo/Heme/Allergies: Negative for environmental allergies and polydipsia. Bruises/bleeds easily.  Psychiatric/Behavioral: Negative for depression, hallucinations, memory loss, substance abuse and suicidal ideas. The patient is not nervous/anxious and does not have insomnia.     Physical Exam  Constitutional: She is oriented to person, place, and time. She appears well-developed and well-nourished.  HENT:  Head: Normocephalic and atraumatic.  Eyes: Conjunctivae are normal. Pupils are equal, round, and reactive to light.  Neck: Normal range of motion. Neck supple.  Cardiovascular: Intact distal pulses.   Respiratory: Effort normal.  GI: She exhibits distension.  Slow bowel sounds  Musculoskeletal: She exhibits edema.  Neurological: She is alert and oriented to person, place, and time.  Skin: Skin is warm and dry.  Psychiatric: She has a normal mood and affect. Her behavior is normal. Judgment and thought content normal.    Encounter Medications:   Outpatient Encounter Prescriptions as of 12/23/2016  Medication Sig Note  . acetaminophen (TYLENOL) 500 MG tablet Take 2 tablets (1,000 mg total) by mouth 3 (three) times  daily. 09/30/2016: Now prn only  . Blood Glucose Monitoring Suppl (FREESTYLE LITE) DEVI Check blood sugar once daily and as directed. Dx E11.9   . Calcium-Magnesium-Vitamin D [phone] MG-MG-UNIT TABS Take 1 tablet by  mouth 2 (two) times daily.   Marland Kitchen donepezil (ARICEPT) 5 MG tablet Take 1 tablet (5 mg total) by mouth at bedtime.   . felodipine (PLENDIL) 2.5 MG 24 hr tablet TAKE 1 TABLET DAILY   . ferrous sulfate 325 (65 FE) MG tablet Take 325 mg by mouth daily with breakfast.   . fluticasone furoate-vilanterol (BREO ELLIPTA) 100-25 MCG/INH AEPB Inhale 1 puff into the lungs daily.   . Fluticasone-Salmeterol (ADVAIR DISKUS) 250-50 MCG/DOSE AEPB Inhale 1 puff into the lungs 2 (two) times daily.   . furosemide (LASIX) 40 MG tablet Take 80mg  in the morning, 40mg  in afternoon 12/23/2016: As of 12/18/16 She is up to 120 mg bid for increased edema per pcp   . glucose blood (FREESTYLE LITE) test strip Check blood sugar once daily and as directed. Dx E11.9   . Lancets (FREESTYLE) lancets Check blood sugar once daily and as directed. Dx E11.9   . latanoprost (XALATAN) 0.005 % ophthalmic solution Place 1 drop into both eyes at bedtime.    Marland Kitchen levothyroxine (SYNTHROID, LEVOTHROID) 50 MCG tablet TAKE 1 TABLET DAILY   . lidocaine (LIDODERM) 5 % Place 1 patch onto the skin daily. Remove & Discard patch within 12 hours or as directed by MD 10/28/2016: Has not used in a long time - use as needed  . LORazepam (ATIVAN) 0.5 MG tablet Take 1 tablet (0.5 mg total) by mouth every 8 (eight) hours as needed for anxiety. 10/28/2016: Use as needed  last time taken last week x 1 "rarely"   . Misc Natural Products (FIBER 7) POWD Take by mouth daily.   . Multiple Vitamin (MULTIVITAMIN) capsule Take 1 capsule by mouth every morning.    . Omega-3 Fatty Acids (FISH OIL CONCENTRATE) 1000 MG CAPS Take 2 capsules by mouth 2 (two) times daily.     . potassium chloride SA (K-DUR,KLOR-CON) 20 MEQ tablet Take 1 tablet (20 mEq total) by mouth 2 (two) times daily.   . sertraline (ZOLOFT) 100 MG tablet Take 1 tablet (100 mg total) by mouth daily.   . vitamin B-12 (CYANOCOBALAMIN) 500 MCG tablet Take 500 mcg by mouth daily.   Marland Kitchen warfarin (COUMADIN) 2.5 MG tablet  Take 1 tablet daily except 1 1/2 tablets on Mondays and Thursdays (Patient taking differently: Take 3.75 mg by mouth every evening. ) 09/30/2016: 09/30/16 doses depends on lab values    No facility-administered encounter medications on file as of 12/23/2016.     Functional Status:   In your present state of health, do you have any difficulty performing the following activities: 12/23/2016 10/29/2016  Hearing? Y N  Vision? N N  Difficulty concentrating or making decisions? Y N  Walking or climbing stairs? Y N  Dressing or bathing? Y N  Doing errands, shopping? Y -  Conservation officer, nature and eating ? Y -  Using the Toilet? N -  In the past six months, have you accidently leaked urine? Y -  Do you have problems with loss of bowel control? N -  Managing your Medications? Y -  Managing your Finances? Y -  Housekeeping or managing your Housekeeping? Y -  Some recent data might be hidden    Fall/Depression Screening:    Fall Risk  12/23/2016 11/11/2016 10/14/2016  Falls in the  past year? No No No  Number falls in past yr: - - -  Injury with Fall? - - -  Risk for fall due to : Impaired balance/gait;Medication side effect;Impaired mobility;Mental status change;Impaired vision - Impaired balance/gait;Impaired mobility;Impaired vision;Medication side effect  Risk for fall due to (comments): - - -   PHQ 2/9 Scores 12/23/2016 09/24/2016 09/02/2016 08/08/2015 04/26/2014 04/20/2013 03/29/2012  PHQ - 2 Score 2 2 3  0 2 4 1   PHQ- 9 Score 10 8 19  - 8 14 -    Assessment:   [name] presented in her kitchen walking with her Rolator smiling this morning and talking with her caregiver, [name]- " She is making me walk around more"  Went for hearing test but decide not to get hearing aids  only change in meds was lasix increased to 120 mg bid since Friday 12/18/16  weight as high as 124 lbs since last home visit and caregivers are following chf zones recommendations by contacting the pcp with weight gains for treatment  plan changes  appetite dropped hardly finishes eating Today is having oatmeal for breakfast still likes fish sandwiches daily but rest of diet  Remains low sodium food as confirmed by caregiver who prepares it  The 1/2 of a fish sandwich a day is provided by her son (whom CM has spoke with before) She reports a loss of taste for glucerna, down form 2 a fay to 1 a day- [facility] CM recommended other alternatives to include premier, powerade, ice,etc  Discussed distended abdomen symptoms (vomiting, constipation, abdominal pain, no stool in well formed stools in 3 days, urine color changes, nausea, etc) to watch out for to call in to pcp/GI provider Last BM was last night and generally has one every night   Sleep with heating pad to feet uses new homedics massager when up in chair Next appoint with [facility] counselor is on December 31 2016 On December 24 2016 she goes to see the dermatologist Prefers no to ADTS services "covered" after CM again re discussed the available services, denies a call form ADTS, [name] reports she had not received a call from ADTS and if Mr Riding or her sons have they have not shared but states pt will not need ADTS services   With the discussion of her last hospital stay she mentioned "once you get placed on oxygen you don't get off" [facility] [place] Cm discussed with her that she would have to be evaluated and generally the only reason her oxygen would stop is if  She could maintain her oxygen sat level above 88%   Plan: Discussed going to monthly visits or call in July 2018 and agreed on next home visit for June 2018 Notes routed to care team members [facility] CM Care Plan Problem One     Most Recent Value  Care Plan Problem One  Knowledge Deficits related to Chronic Health Condition (CHF) as evidenced by patient and caregiver questions about plan of care  Role Documenting the Problem One  Care Management Coordinator  Care Plan for Problem One  Active  [facility] Long Term Goal   Knowledge Deficits  related to Chronic Health Condition (CHF) as evidenced by patient and caregiver questions about plan of care and patient intake of increases sodium foods  [facility] Long Term Goal Start Date  12/23/16  Interventions for Problem One Long Term Goal   Review avoidance of fast foods, processed  and can foods that have increase salt content and lead to edema, review  CHF low sodium diet   [facility] CM Short Term Goal #1   over the next 30 days the patient will decrease intake of sodium intake and take diuretics as ordered to decrease swelling   [facility] CM Short Term Goal #1 Start Date  12/23/16  Interventions for Short Term Goal #1   Review avoidance of fast foods, processed  and can foods that have increase salt content and lead to edema, review CHF low sodium diet Review of mobility daily with and without her PT therapist and antacids for flatus Discussed abdominal distension reasons,  symptoms to watch out for to call in to pcp/GI provider       Kimberly L. Lavina Hamman, RN, BSN, [place] Care Management [phone]

## 2016-12-24 ENCOUNTER — Ambulatory Visit: Payer: Self-pay | Admitting: *Deleted

## 2016-12-24 DIAGNOSIS — M62830 Muscle spasm of back: Secondary | ICD-10-CM | POA: Diagnosis not present

## 2016-12-24 DIAGNOSIS — I5032 Chronic diastolic (congestive) heart failure: Secondary | ICD-10-CM | POA: Diagnosis not present

## 2016-12-24 DIAGNOSIS — I110 Hypertensive heart disease with heart failure: Secondary | ICD-10-CM | POA: Diagnosis not present

## 2016-12-24 DIAGNOSIS — I50810 Right heart failure, unspecified: Secondary | ICD-10-CM | POA: Diagnosis not present

## 2016-12-24 DIAGNOSIS — J449 Chronic obstructive pulmonary disease, unspecified: Secondary | ICD-10-CM | POA: Diagnosis not present

## 2016-12-24 DIAGNOSIS — E119 Type 2 diabetes mellitus without complications: Secondary | ICD-10-CM | POA: Diagnosis not present

## 2016-12-24 DIAGNOSIS — L570 Actinic keratosis: Secondary | ICD-10-CM | POA: Diagnosis not present

## 2016-12-24 NOTE — Telephone Encounter (Signed)
Agree. Thanks

## 2016-12-24 NOTE — Telephone Encounter (Signed)
Left detailed message on vm for Debbie with verbal orders

## 2016-12-29 DIAGNOSIS — E119 Type 2 diabetes mellitus without complications: Secondary | ICD-10-CM | POA: Diagnosis not present

## 2016-12-29 DIAGNOSIS — J449 Chronic obstructive pulmonary disease, unspecified: Secondary | ICD-10-CM | POA: Diagnosis not present

## 2016-12-29 DIAGNOSIS — I5032 Chronic diastolic (congestive) heart failure: Secondary | ICD-10-CM | POA: Diagnosis not present

## 2016-12-29 DIAGNOSIS — I50810 Right heart failure, unspecified: Secondary | ICD-10-CM | POA: Diagnosis not present

## 2016-12-29 DIAGNOSIS — M62830 Muscle spasm of back: Secondary | ICD-10-CM | POA: Diagnosis not present

## 2016-12-29 DIAGNOSIS — I110 Hypertensive heart disease with heart failure: Secondary | ICD-10-CM | POA: Diagnosis not present

## 2016-12-30 ENCOUNTER — Other Ambulatory Visit: Payer: Self-pay | Admitting: Licensed Clinical Social Worker

## 2016-12-30 NOTE — Patient Outreach (Signed)
Assessment:  CSW spoke via phone with client. CSW verified client identity. CSW and client spoke of client needs. Client sees Dr. Danise Mina as primary care doctor. Client said she had her prescribed medications and is taking medications as prescribed.  Client has support from her spouse and from her son. Dr. Danise Mina, primary doctor for client, ordered for client to again begin receiving home health physical therapy support. Client is now receiving home health physical therpay sessions as scheduled for client with [facility]. CSW and client spoke of client care plan. CSW encouraged client to participate in all scheduled client in home physical therapy sessions for client in next 30 days with [facility]. Client uses a walker to help her with ambulation. Client said that her family members help in transporting client to and from client's scheduled medical appointments.  [name], neighbor of client, helps client attend client's medical appointments, helps client to receive client's meals on time each day and helps client with maintaining client's daily schedule.  Client has also begun to attend behavioral health counseling appointments as scheduled. Client said that attending her scheduled behavioral health appointments has been very helpful to client. CSW encouraged client to call RN Joellyn Quails to discuss nursing needs of client. CSW encouraged client to call CSW at [phone] as needed to discuss social work needs of client.  CSW thanked client for phone call with CSW on 12/30/16. Client was appreciative of phone call from [place] on 12/30/16.  Plan:  Client to participate in all scheduled client in home physical therapy sessions for client in next 30 days with [facility].   CSW to collaborate with RN Joellyn Quails in monitoring needs of client.  CSW to call client in 4 weeks to assess client needs at that time.  Norva Riffle.Rabon Scholle MSW, LCSW Licensed Clinical Social  Worker [facility] Care Management [phone]

## 2016-12-31 ENCOUNTER — Ambulatory Visit (INDEPENDENT_AMBULATORY_CARE_PROVIDER_SITE_OTHER): Payer: Medicare Other | Admitting: Psychology

## 2016-12-31 DIAGNOSIS — F331 Major depressive disorder, recurrent, moderate: Secondary | ICD-10-CM

## 2016-12-31 DIAGNOSIS — I5032 Chronic diastolic (congestive) heart failure: Secondary | ICD-10-CM | POA: Diagnosis not present

## 2016-12-31 DIAGNOSIS — E119 Type 2 diabetes mellitus without complications: Secondary | ICD-10-CM | POA: Diagnosis not present

## 2016-12-31 DIAGNOSIS — I50810 Right heart failure, unspecified: Secondary | ICD-10-CM | POA: Diagnosis not present

## 2016-12-31 DIAGNOSIS — J449 Chronic obstructive pulmonary disease, unspecified: Secondary | ICD-10-CM | POA: Diagnosis not present

## 2016-12-31 DIAGNOSIS — I110 Hypertensive heart disease with heart failure: Secondary | ICD-10-CM | POA: Diagnosis not present

## 2016-12-31 DIAGNOSIS — M62830 Muscle spasm of back: Secondary | ICD-10-CM | POA: Diagnosis not present

## 2017-01-03 ENCOUNTER — Other Ambulatory Visit: Payer: Self-pay | Admitting: Family Medicine

## 2017-01-05 ENCOUNTER — Other Ambulatory Visit: Payer: Self-pay | Admitting: *Deleted

## 2017-01-05 DIAGNOSIS — J449 Chronic obstructive pulmonary disease, unspecified: Secondary | ICD-10-CM | POA: Diagnosis not present

## 2017-01-05 DIAGNOSIS — I50810 Right heart failure, unspecified: Secondary | ICD-10-CM | POA: Diagnosis not present

## 2017-01-05 DIAGNOSIS — E119 Type 2 diabetes mellitus without complications: Secondary | ICD-10-CM | POA: Diagnosis not present

## 2017-01-05 DIAGNOSIS — M62830 Muscle spasm of back: Secondary | ICD-10-CM | POA: Diagnosis not present

## 2017-01-05 DIAGNOSIS — I110 Hypertensive heart disease with heart failure: Secondary | ICD-10-CM | POA: Diagnosis not present

## 2017-01-05 DIAGNOSIS — I5032 Chronic diastolic (congestive) heart failure: Secondary | ICD-10-CM | POA: Diagnosis not present

## 2017-01-05 NOTE — Patient Outreach (Signed)
[facility]) Care Management  01/05/2017  NICHOL ATOR 1925-08-23 [id number]  Care coordination  [facility] CM left a voice message after attempt to reach her to follow up with her   Plan: to see pt this week for home visit   [place]. Lavina Hamman, RN, BSN, [facility] Care Management [phone]

## 2017-01-06 ENCOUNTER — Other Ambulatory Visit: Payer: Self-pay | Admitting: *Deleted

## 2017-01-06 NOTE — Patient Outreach (Signed)
[facility]) Care Management   01/06/2017  [name] 1926/02/01 [id number]  [name] is an 83 y.o. female with medical history significant of atrial fibrillation, CHF, HTN, hypothyroidism, DM Type II, GERD, and nonspecific lung disease with worsening shortness of breath for 1 week prior to her inpatient Admission on 09/11/16. She was discharged to home with home health RN and PT visits.  Valerie Montgomerywas referred to [place] Management for CHF Disease Management and care coordination and level of care concerns. This is there fourth home visit to [name] since she was referred to [facility] program in March 2018. She presents as usually to be sitting up in her Rolator in her living room after noting lying on her living room couch. She welcomes the [place] visit along with her caregiver, [name]. Valerie. [name] is present again today during this visit and is sleeping one of the living room couches.    Subjective: " I feel the same"  Objective:   BP 100/60   Pulse 75   Temp (!) 95.4 F (35.2 C) (Oral)   Resp 20   Ht 1.575 m (5\' 2" )   Wt 121 lb 9.6 oz (55.2 kg)   SpO2 95%   BMI 22.24 kg/m   Review of Systems  Constitutional: Negative for chills, diaphoresis, fever, malaise/fatigue and weight loss.  HENT: Positive for hearing loss. Negative for congestion, ear discharge, ear pain, nosebleeds, sinus pain, sore throat and tinnitus.   Eyes: Negative for blurred vision, double vision, photophobia, pain, discharge and redness.  Respiratory: Negative for cough, hemoptysis, sputum production, shortness of breath, wheezing and stridor.   Cardiovascular: Positive for leg swelling. Negative for chest pain, palpitations, orthopnea, claudication and PND.  Gastrointestinal: Negative for abdominal pain, blood in stool, constipation, diarrhea, heartburn, melena, nausea and vomiting.  Genitourinary: Negative for dysuria, flank pain, frequency, hematuria  and urgency.  Musculoskeletal: Positive for back pain and joint pain. Negative for falls, myalgias and neck pain.  Skin: Negative for itching and rash.  Neurological: Positive for weakness. Negative for dizziness, tingling, tremors, sensory change, speech change, focal weakness, seizures, loss of consciousness and headaches.  Endo/Heme/Allergies: Negative for environmental allergies and polydipsia. Does not bruise/bleed easily.  Psychiatric/Behavioral: Negative for depression, hallucinations, memory loss, substance abuse and suicidal ideas. The patient is not nervous/anxious and does not have insomnia.     Physical Exam  Constitutional: She is oriented to person, place, and time. She appears well-developed and well-nourished.  HENT:  Head: Normocephalic and atraumatic.  Eyes: Conjunctivae and EOM are normal. Pupils are equal, round, and reactive to light.  Neck: Normal range of motion. Neck supple.  Cardiovascular: Normal rate, regular rhythm, normal heart sounds and intact distal pulses.   Respiratory: Effort normal and breath sounds normal.  GI: Bowel sounds are normal. She exhibits distension.  Musculoskeletal: Normal range of motion.  Neurological: She is alert and oriented to person, place, and time.  Skin: Skin is warm and dry.  Psychiatric: She has a normal mood and affect. Her behavior is normal. Judgment and thought content normal.    Encounter Medications:   Outpatient Encounter Prescriptions as of 01/06/2017  Medication Sig Note  . acetaminophen (TYLENOL) 500 MG tablet Take 2 tablets (1,000 mg total) by mouth 3 (three) times daily. 09/30/2016: Now prn only  . Blood Glucose Monitoring Suppl (FREESTYLE LITE) DEVI Check blood sugar once daily and as directed. Dx E11.9   . Calcium-Magnesium-Vitamin D [phone] MG-MG-UNIT TABS Take 1  tablet by mouth 2 (two) times daily.   Marland Kitchen donepezil (ARICEPT) 5 MG tablet TAKE 1 TABLET(5 MG) BY MOUTH AT BEDTIME   . felodipine (PLENDIL) 2.5 MG 24  hr tablet TAKE 1 TABLET DAILY   . ferrous sulfate 325 (65 FE) MG tablet Take 325 mg by mouth daily with breakfast.   . fluticasone furoate-vilanterol (BREO ELLIPTA) 100-25 MCG/INH AEPB Inhale 1 puff into the lungs daily.   . Fluticasone-Salmeterol (ADVAIR DISKUS) 250-50 MCG/DOSE AEPB Inhale 1 puff into the lungs 2 (two) times daily.   . furosemide (LASIX) 40 MG tablet Take 80mg  in the morning, 40mg  in afternoon 12/23/2016: As of 12/18/16 She is up to 120 mg bid for increased edema per pcp   . glucose blood (FREESTYLE LITE) test strip Check blood sugar once daily and as directed. Dx E11.9   . Lancets (FREESTYLE) lancets Check blood sugar once daily and as directed. Dx E11.9   . latanoprost (XALATAN) 0.005 % ophthalmic solution Place 1 drop into both eyes at bedtime.    Marland Kitchen levothyroxine (SYNTHROID, LEVOTHROID) 50 MCG tablet TAKE 1 TABLET DAILY   . lidocaine (LIDODERM) 5 % Place 1 patch onto the skin daily. Remove & Discard patch within 12 hours or as directed by MD 10/28/2016: Has not used in a long time - use as needed  . LORazepam (ATIVAN) 0.5 MG tablet Take 1 tablet (0.5 mg total) by mouth every 8 (eight) hours as needed for anxiety. 10/28/2016: Use as needed  last time taken last week x 1 "rarely"   . Misc Natural Products (FIBER 7) POWD Take by mouth daily.   . Multiple Vitamin (MULTIVITAMIN) capsule Take 1 capsule by mouth every morning.    . Omega-3 Fatty Acids (FISH OIL CONCENTRATE) 1000 MG CAPS Take 2 capsules by mouth 2 (two) times daily.     . potassium chloride SA (K-DUR,KLOR-CON) 20 MEQ tablet Take 1 tablet (20 mEq total) by mouth 2 (two) times daily.   . sertraline (ZOLOFT) 100 MG tablet Take 1 tablet (100 mg total) by mouth daily.   . vitamin B-12 (CYANOCOBALAMIN) 500 MCG tablet Take 500 mcg by mouth daily.   Marland Kitchen warfarin (COUMADIN) 2.5 MG tablet Take 1 tablet daily except 1 1/2 tablets on Mondays and Thursdays (Patient taking differently: Take 3.75 mg by mouth every evening. ) 09/30/2016:  09/30/16 doses depends on lab values    No facility-administered encounter medications on file as of 01/06/2017.     Functional Status:   In your present state of health, do you have any difficulty performing the following activities: 12/23/2016 10/29/2016  Hearing? Y N  Vision? N N  Difficulty concentrating or making decisions? Y N  Walking or climbing stairs? Y N  Dressing or bathing? Y N  Doing errands, shopping? Y -  Conservation officer, nature and eating ? Y -  Using the Toilet? N -  In the past six months, have you accidently leaked urine? Y -  Do you have problems with loss of bowel control? N -  Managing your Medications? Y -  Managing your Finances? Y -  Housekeeping or managing your Housekeeping? Y -  Some recent data might be hidden    Fall/Depression Screening:    Fall Risk  12/23/2016 11/11/2016 10/14/2016  Falls in the past year? No No No  Number falls in past yr: - - -  Injury with Fall? - - -  Risk for fall due to : Impaired balance/gait;Medication side effect;Impaired mobility;Mental status change;Impaired vision -  Impaired balance/gait;Impaired mobility;Impaired vision;Medication side effect  Risk for fall due to (comments): - - -   PHQ 2/9 Scores 12/23/2016 09/24/2016 09/02/2016 08/08/2015 04/26/2014 04/20/2013 03/29/2012  PHQ - 2 Score 2 2 3  0 2 4 1   PHQ- 9 Score 10 8 19  - 8 14 -    Assessment:   HIPAA verified Presents today  Sitting on her straight back chair saw bambi, counselor on 12/31/16 and continues to have a good relationship Reports they painted together Contact from thn sw on 12/30/16  labs good  She states she is not want hearing aids yet Had 11/30/16 audio test "not much in favor of the idea" reports good  pain in left hip and right shoulder with increase exercises THN CM discussed benefits of use of heat or ice 6-12 hours after PT services and asked her to mention this to [name] and annette PT wt 121.6 with an average on  June 22 =120.6-124, may average 119.5,  April  118.6 [facility] CM left a note to [place] on Valerie Lahm journal about ice /heat after sessions after recalling Valerie Townsend has memory issues  increase ankle swelling [facility] CM discussed calling CV or pcp to report symptoms plus elevation of legs, increase diuretics and  watch diet  Valerie Vasudevan had on 12/22/16 increased abdominal pains reported as gas THN CM discussed walking and gastric mobility Her stomach was noted to be tight on assessment She informed [facility] Cm she believes she could "poop" after the visit to resolve the issues  Plan: follow up 1- 2 weeks for home visit THN to route note to pcp and other medical providers listed in EPIC (care team)  [facility] CM Care Plan Problem One     Most Recent Value  Care Plan Problem One  Knowledge Deficits related to Chronic Health Condition (CHF) as evidenced by patient and caregiver questions about plan of care  Role Documenting the Problem One  Care Management Coordinator  Care Plan for Problem One  Active  THN Long Term Goal   Knowledge Deficits related to Chronic Health Condition (CHF) as evidenced by patient and caregiver questions about plan of care and patient intake of increases sodium foods  THN Long Term Goal Start Date  12/23/16  Interventions for Problem One Long Term Goal   Review avoidance of fast foods, processed  and can foods that have increase salt content and lead to edema, review CHF low sodium diet   THN CM Short Term Goal #1   over the next 30 days the patient will decrease intake of sodium intake and take diuretics as ordered to decrease swelling   THN CM Short Term Goal #1 Start Date  12/23/16  Interventions for Short Term Goal #1   Review avoidance of fast foods, processed  and can foods that have increase salt content and lead to edema, review CHF low sodium diet Review of mobility daily with and without her PT therapist and antacids for flatus Discussed abdominal distension reasons,  symptoms to watch out for to call in to pcp/GI  provider        [place]. Lavina Hamman, RN, BSN, [place] Care Management [phone]

## 2017-01-07 DIAGNOSIS — I110 Hypertensive heart disease with heart failure: Secondary | ICD-10-CM | POA: Diagnosis not present

## 2017-01-07 DIAGNOSIS — E119 Type 2 diabetes mellitus without complications: Secondary | ICD-10-CM | POA: Diagnosis not present

## 2017-01-07 DIAGNOSIS — M62830 Muscle spasm of back: Secondary | ICD-10-CM | POA: Diagnosis not present

## 2017-01-07 DIAGNOSIS — I5032 Chronic diastolic (congestive) heart failure: Secondary | ICD-10-CM | POA: Diagnosis not present

## 2017-01-07 DIAGNOSIS — J449 Chronic obstructive pulmonary disease, unspecified: Secondary | ICD-10-CM | POA: Diagnosis not present

## 2017-01-07 DIAGNOSIS — I50810 Right heart failure, unspecified: Secondary | ICD-10-CM | POA: Diagnosis not present

## 2017-01-11 DIAGNOSIS — M62830 Muscle spasm of back: Secondary | ICD-10-CM | POA: Diagnosis not present

## 2017-01-11 DIAGNOSIS — I110 Hypertensive heart disease with heart failure: Secondary | ICD-10-CM | POA: Diagnosis not present

## 2017-01-11 DIAGNOSIS — I50810 Right heart failure, unspecified: Secondary | ICD-10-CM | POA: Diagnosis not present

## 2017-01-11 DIAGNOSIS — I5032 Chronic diastolic (congestive) heart failure: Secondary | ICD-10-CM | POA: Diagnosis not present

## 2017-01-11 DIAGNOSIS — J449 Chronic obstructive pulmonary disease, unspecified: Secondary | ICD-10-CM | POA: Diagnosis not present

## 2017-01-11 DIAGNOSIS — E119 Type 2 diabetes mellitus without complications: Secondary | ICD-10-CM | POA: Diagnosis not present

## 2017-01-12 DIAGNOSIS — H40013 Open angle with borderline findings, low risk, bilateral: Secondary | ICD-10-CM | POA: Diagnosis not present

## 2017-01-14 ENCOUNTER — Ambulatory Visit (INDEPENDENT_AMBULATORY_CARE_PROVIDER_SITE_OTHER): Payer: Medicare Other | Admitting: Psychology

## 2017-01-14 DIAGNOSIS — F331 Major depressive disorder, recurrent, moderate: Secondary | ICD-10-CM

## 2017-01-15 DIAGNOSIS — J449 Chronic obstructive pulmonary disease, unspecified: Secondary | ICD-10-CM | POA: Diagnosis not present

## 2017-01-15 DIAGNOSIS — E119 Type 2 diabetes mellitus without complications: Secondary | ICD-10-CM | POA: Diagnosis not present

## 2017-01-15 DIAGNOSIS — I50810 Right heart failure, unspecified: Secondary | ICD-10-CM | POA: Diagnosis not present

## 2017-01-15 DIAGNOSIS — M62830 Muscle spasm of back: Secondary | ICD-10-CM | POA: Diagnosis not present

## 2017-01-15 DIAGNOSIS — I5032 Chronic diastolic (congestive) heart failure: Secondary | ICD-10-CM | POA: Diagnosis not present

## 2017-01-15 DIAGNOSIS — I110 Hypertensive heart disease with heart failure: Secondary | ICD-10-CM | POA: Diagnosis not present

## 2017-01-18 ENCOUNTER — Ambulatory Visit (INDEPENDENT_AMBULATORY_CARE_PROVIDER_SITE_OTHER): Payer: Medicare Other | Admitting: *Deleted

## 2017-01-18 DIAGNOSIS — J449 Chronic obstructive pulmonary disease, unspecified: Secondary | ICD-10-CM | POA: Diagnosis not present

## 2017-01-18 DIAGNOSIS — I481 Persistent atrial fibrillation: Secondary | ICD-10-CM

## 2017-01-18 DIAGNOSIS — I5032 Chronic diastolic (congestive) heart failure: Secondary | ICD-10-CM | POA: Diagnosis not present

## 2017-01-18 DIAGNOSIS — Z5181 Encounter for therapeutic drug level monitoring: Secondary | ICD-10-CM

## 2017-01-18 DIAGNOSIS — I4819 Other persistent atrial fibrillation: Secondary | ICD-10-CM

## 2017-01-18 DIAGNOSIS — I110 Hypertensive heart disease with heart failure: Secondary | ICD-10-CM | POA: Diagnosis not present

## 2017-01-18 DIAGNOSIS — G458 Other transient cerebral ischemic attacks and related syndromes: Secondary | ICD-10-CM | POA: Diagnosis not present

## 2017-01-18 DIAGNOSIS — E119 Type 2 diabetes mellitus without complications: Secondary | ICD-10-CM | POA: Diagnosis not present

## 2017-01-18 DIAGNOSIS — I50810 Right heart failure, unspecified: Secondary | ICD-10-CM | POA: Diagnosis not present

## 2017-01-18 DIAGNOSIS — M62830 Muscle spasm of back: Secondary | ICD-10-CM | POA: Diagnosis not present

## 2017-01-18 LAB — POCT INR: INR: 2.7

## 2017-01-21 ENCOUNTER — Telehealth: Payer: Self-pay

## 2017-01-21 NOTE — Telephone Encounter (Signed)
Legrand Como pts son left v/m (do not see DPR signed for Legrand Como) [facility] needs order for hospital bed faxed to [phone]. Legrand Como said Dr Darnell Level has said in the past pt could have hospital bed.Please advise. Pt last seen 12/17/16.

## 2017-01-22 NOTE — Telephone Encounter (Signed)
Rx written and in CMA box 

## 2017-01-22 NOTE — Telephone Encounter (Signed)
Patient's son called.  He spoke to [place] and they told him they were going to fax a request to Dr.G for the hospital bed order.  They told patient's son they needed more information about the hospital bed.  [facility] faxed the new order and I put it on Dr.G's desk on his keyboard.  I let patient's son know Dr.G is out of the office until Monday.  He asked for Dr.G to send it back as soon as possible.

## 2017-01-22 NOTE — Telephone Encounter (Signed)
Orders faxed as requested.

## 2017-01-25 NOTE — Telephone Encounter (Signed)
Form filled out and in my outbox.

## 2017-01-26 DIAGNOSIS — I110 Hypertensive heart disease with heart failure: Secondary | ICD-10-CM | POA: Diagnosis not present

## 2017-01-26 DIAGNOSIS — J449 Chronic obstructive pulmonary disease, unspecified: Secondary | ICD-10-CM | POA: Diagnosis not present

## 2017-01-26 DIAGNOSIS — M62830 Muscle spasm of back: Secondary | ICD-10-CM | POA: Diagnosis not present

## 2017-01-26 DIAGNOSIS — I50810 Right heart failure, unspecified: Secondary | ICD-10-CM | POA: Diagnosis not present

## 2017-01-26 DIAGNOSIS — E119 Type 2 diabetes mellitus without complications: Secondary | ICD-10-CM | POA: Diagnosis not present

## 2017-01-26 DIAGNOSIS — I5032 Chronic diastolic (congestive) heart failure: Secondary | ICD-10-CM | POA: Diagnosis not present

## 2017-01-27 ENCOUNTER — Telehealth: Payer: Self-pay

## 2017-01-27 NOTE — Telephone Encounter (Signed)
Agree. Thanks

## 2017-01-27 NOTE — Telephone Encounter (Signed)
Lm on Debbie's vm and provided verbal orders

## 2017-01-27 NOTE — Telephone Encounter (Signed)
[name] Pt with [facility] left v/m requesting verbal order for extension HH PT 1 x a week for 1 week due to pt missing one PT visit.

## 2017-01-28 ENCOUNTER — Ambulatory Visit (INDEPENDENT_AMBULATORY_CARE_PROVIDER_SITE_OTHER): Payer: Medicare Other | Admitting: Psychology

## 2017-01-28 ENCOUNTER — Ambulatory Visit (INDEPENDENT_AMBULATORY_CARE_PROVIDER_SITE_OTHER): Payer: Medicare Other | Admitting: Family Medicine

## 2017-01-28 ENCOUNTER — Other Ambulatory Visit: Payer: Self-pay | Admitting: Licensed Clinical Social Worker

## 2017-01-28 ENCOUNTER — Other Ambulatory Visit: Payer: Self-pay | Admitting: Family Medicine

## 2017-01-28 ENCOUNTER — Encounter: Payer: Self-pay | Admitting: Family Medicine

## 2017-01-28 VITALS — BP 108/70 | HR 66 | Temp 97.7°F | Ht 62.0 in | Wt 126.0 lb

## 2017-01-28 DIAGNOSIS — I50810 Right heart failure, unspecified: Secondary | ICD-10-CM | POA: Diagnosis not present

## 2017-01-28 DIAGNOSIS — I4819 Other persistent atrial fibrillation: Secondary | ICD-10-CM

## 2017-01-28 DIAGNOSIS — J9611 Chronic respiratory failure with hypoxia: Secondary | ICD-10-CM

## 2017-01-28 DIAGNOSIS — I5032 Chronic diastolic (congestive) heart failure: Secondary | ICD-10-CM | POA: Diagnosis not present

## 2017-01-28 DIAGNOSIS — Z9981 Dependence on supplemental oxygen: Secondary | ICD-10-CM | POA: Diagnosis not present

## 2017-01-28 DIAGNOSIS — M62830 Muscle spasm of back: Secondary | ICD-10-CM | POA: Diagnosis not present

## 2017-01-28 DIAGNOSIS — J449 Chronic obstructive pulmonary disease, unspecified: Secondary | ICD-10-CM | POA: Diagnosis not present

## 2017-01-28 DIAGNOSIS — D7589 Other specified diseases of blood and blood-forming organs: Secondary | ICD-10-CM

## 2017-01-28 DIAGNOSIS — I481 Persistent atrial fibrillation: Secondary | ICD-10-CM

## 2017-01-28 DIAGNOSIS — I110 Hypertensive heart disease with heart failure: Secondary | ICD-10-CM | POA: Diagnosis not present

## 2017-01-28 DIAGNOSIS — J984 Other disorders of lung: Secondary | ICD-10-CM

## 2017-01-28 DIAGNOSIS — F331 Major depressive disorder, recurrent, moderate: Secondary | ICD-10-CM

## 2017-01-28 DIAGNOSIS — E119 Type 2 diabetes mellitus without complications: Secondary | ICD-10-CM | POA: Diagnosis not present

## 2017-01-28 LAB — BASIC METABOLIC PANEL
BUN: 17 mg/dL (ref 6–23)
CALCIUM: 9.6 mg/dL (ref 8.4–10.5)
CO2: 32 mEq/L (ref 19–32)
Chloride: 101 mEq/L (ref 96–112)
Creatinine, Ser: 0.83 mg/dL (ref 0.40–1.20)
GFR: 68.74 mL/min (ref >60.00)
Glucose, Bld: 87 mg/dL (ref 70–99)
POTASSIUM: 4.2 meq/L (ref 3.5–5.1)
SODIUM: 140 meq/L (ref 135–145)

## 2017-01-28 LAB — BRAIN NATRIURETIC PEPTIDE: Pro B Natriuretic peptide (BNP): 742 pg/mL — ABNORMAL HIGH (ref 0.0–100.0)

## 2017-01-28 LAB — VITAMIN B12: Vitamin B-12: 801 pg/mL (ref 211–911)

## 2017-01-28 MED ORDER — SPIRONOLACTONE 25 MG PO TABS: 12.5000 mg | ORAL_TABLET | Freq: Every day | ORAL | 3 refills | Status: DC

## 2017-01-28 MED ORDER — POTASSIUM CHLORIDE CRYS ER 20 MEQ PO TBCR: 20.0000 meq | EXTENDED_RELEASE_TABLET | Freq: Every day | ORAL | 6 refills | Status: DC

## 2017-01-28 NOTE — Assessment & Plan Note (Signed)
From recent pulm eval: Pulmonary function testing shows no evidence of airway obstruction or significant bronchodilator response but does demonstrate a mild restriction as well as severely reduced carbon monoxide diffusion capacity likely secondary to her underlying diastolic congestive heart failure.

## 2017-01-28 NOTE — Assessment & Plan Note (Signed)
Currently on inhaler holiday per pulm as previously unclear if helping. However she does endorse worsening dyspnea recently. I suggested she restart inhaler she has at home and notify me if helpful [facility]. Pt/son agree with plan.

## 2017-01-28 NOTE — Assessment & Plan Note (Signed)
Multifactorial including mild COPD, significant right heart and diastolic failure, currently on 2.5L [place].

## 2017-01-28 NOTE — Assessment & Plan Note (Addendum)
Ongoing, on diuretic and oxygen therapy

## 2017-01-28 NOTE — Assessment & Plan Note (Addendum)
Continue coumadin, followed by coumadin clinic.

## 2017-01-28 NOTE — Patient Outreach (Signed)
Assessment:  CSW spoke via phone with client. CSW verified client identity. CSW and client spoke of client needs. Client sees Dr. Danise Mina as primary care doctor.  Client said she had her prescribed medications and is taking medications as prescribed. Client has support from her spouse and from her son. Client has been receiving home health physical therapy sessions as scheduled with [facility]. CSW and client spoke of client care plan. CSW encouraged client to participate in all scheduled client in home physical therapy sessions for client in next 30 days with [facility]. Client uses a walker to assist her with ambulation. Client said her family members transport her to and from her scheduled medical appointments. [name], a neighbor and good friend of client, has been helping client to attend client's appointments on schedule, has been assisting client to obtain client meals as needed and has helped client in maintaining a daily schedule for client. Client has begun attending behavioral health counseling appointments, as scheduled, for client. Client stated that counseling sessions she is attending are helpful to client.  Client is receiving [facility] nursing support with RN Joellyn Quails. Client has some memory challenges. Client has support from her spouse.  Client has not had any recent falls.  CSW encouraged that client or her spouse please call CSW at [phone] as needed to discuss social work needs of client.   Plan:  Client to participate in all scheduled client in home physical therapy sessions for client in next 30 days with [facility].  CSW to collaborate with RN Joellyn Quails in monitoring needs of client.   CSW to call client in 4 weeks to assess client needs at that time.  Norva Riffle.Jamerica Snavely MSW, LCSW Licensed Clinical Social Worker [facility] Care Management [phone]

## 2017-01-28 NOTE — Progress Notes (Signed)
BP 108/70   Pulse 66   Temp 97.7 F (36.5 C)   Ht 5\' 2"  (1.575 m)   Wt 126 lb (57.2 kg)   SpO2 98% Comment: 2.5L o2  BMI 23.05 kg/m    CC: 6 wk f/u visit Subjective:    Patient ID: [name], female    DOB: 04-01-1926, 83 y.o.   MRN: [id number]  HPI: [name] is a 83 y.o. female presenting on 01/28/2017 for Follow-up   Here with son.  Hospital bed ordered lat week.   Weight gain noted. Compliant with lasix 80/40mg  daily. Finds she voids well. Son says she voids every 1.5 hours. Improved but persistent pedal edema. Increasing dyspnea with mild exertion over the last few days. Denies chest pain, dizziness, palpitations, or headache. Son brings weight log - progressive trend from 122 -> 126 this month.   [facility] involved.   Neighbor [name] helps out 3x/day. Helps with reminders, meetings, transportation.  She recently established with Bambi our counselor   She has stopped breo and advair inhalers - didn't feel this was helping.   Relevant past medical, surgical, family and social history reviewed and updated as indicated. Interim medical history since our last visit reviewed. Allergies and medications reviewed and updated. Outpatient Medications Prior to Visit  Medication Sig Dispense Refill  . acetaminophen (TYLENOL) 500 MG tablet Take 2 tablets (1,000 mg total) by mouth 3 (three) times daily.    . Blood Glucose Monitoring Suppl (FREESTYLE LITE) DEVI Check blood sugar once daily and as directed. Dx E11.9 90 each 1  . Calcium-Magnesium-Vitamin D [phone] MG-MG-UNIT TABS Take 1 tablet by mouth 2 (two) times daily.    Marland Kitchen donepezil (ARICEPT) 5 MG tablet TAKE 1 TABLET(5 MG) BY MOUTH AT BEDTIME 30 tablet 0  . felodipine (PLENDIL) 2.5 MG 24 hr tablet TAKE 1 TABLET DAILY 90 tablet 1  . ferrous sulfate 325 (65 FE) MG tablet Take 325 mg by mouth daily with breakfast.    . fluticasone furoate-vilanterol (BREO ELLIPTA) 100-25 MCG/INH AEPB Inhale 1 puff into the lungs  daily. 1 each 6  . Fluticasone-Salmeterol (ADVAIR DISKUS) 250-50 MCG/DOSE AEPB Inhale 1 puff into the lungs 2 (two) times daily. 180 each 3  . furosemide (LASIX) 40 MG tablet Take 80mg  in the morning, 40mg  in afternoon 90 tablet 6  . glucose blood (FREESTYLE LITE) test strip Check blood sugar once daily and as directed. Dx E11.9 100 each 1  . Lancets (FREESTYLE) lancets Check blood sugar once daily and as directed. Dx E11.9 100 each 1  . latanoprost (XALATAN) 0.005 % ophthalmic solution Place 1 drop into both eyes at bedtime.     Marland Kitchen levothyroxine (SYNTHROID, LEVOTHROID) 50 MCG tablet TAKE 1 TABLET DAILY 90 tablet 3  . lidocaine (LIDODERM) 5 % Place 1 patch onto the skin daily. Remove & Discard patch within 12 hours or as directed by MD 30 patch 6  . LORazepam (ATIVAN) 0.5 MG tablet Take 1 tablet (0.5 mg total) by mouth every 8 (eight) hours as needed for anxiety. 60 tablet 0  . Misc Natural Products (FIBER 7) POWD Take by mouth daily.    . Multiple Vitamin (MULTIVITAMIN) capsule Take 1 capsule by mouth every morning.     . Omega-3 Fatty Acids (FISH OIL CONCENTRATE) 1000 MG CAPS Take 2 capsules by mouth 2 (two) times daily.      . potassium chloride SA (K-DUR,KLOR-CON) 20 MEQ tablet Take 1 tablet (20 mEq total) by mouth  2 (two) times daily. 60 tablet 3  . sertraline (ZOLOFT) 100 MG tablet Take 1 tablet (100 mg total) by mouth daily. 90 tablet 1  . vitamin B-12 (CYANOCOBALAMIN) 500 MCG tablet Take 500 mcg by mouth daily.    Marland Kitchen warfarin (COUMADIN) 2.5 MG tablet Take 1 tablet daily except 1 1/2 tablets on Mondays and Thursdays (Patient taking differently: Take 3.75 mg by mouth every evening. ) 180 tablet 3   No facility-administered medications prior to visit.      Per HPI unless specifically indicated in ROS section below Review of Systems     Objective:    BP 108/70   Pulse 66   Temp 97.7 F (36.5 C)   Ht 5\' 2"  (1.575 m)   Wt 126 lb (57.2 kg)   SpO2 98% Comment: 2.5L o2  BMI 23.05 kg/m    Wt Readings from Last 3 Encounters:  01/28/17 126 lb (57.2 kg)  01/06/17 121 lb 9.6 oz (55.2 kg)  12/23/16 119 lb (54 kg)    Physical Exam  Constitutional: She appears well-developed and well-nourished. No distress.  2.5 L [place]  Walks with rollator  HENT:  Head: Normocephalic and atraumatic.  Cardiovascular: Normal rate, regular rhythm, normal heart sounds and intact distal pulses.   No murmur heard. Overall regular  Pulmonary/Chest: Effort normal and breath sounds normal. No respiratory distress. She has no wheezes. She has no rales.  Mild crackles at bilateral bases  Musculoskeletal: She exhibits edema (1+ pitting edema).  Skin: Skin is warm and dry. No rash noted.  Psychiatric: She has a normal mood and affect.  Nursing note and vitals reviewed.  Results for orders placed or performed in visit on 98/33/82  Basic metabolic panel  Result Value Ref Range   Sodium 140 135 - 145 mEq/L   Potassium 4.2 3.5 - 5.1 mEq/L   Chloride 101 96 - 112 mEq/L   CO2 32 19 - 32 mEq/L   Glucose, Bld 87 70 - 99 mg/dL   BUN 17 6 - 23 mg/dL   Creatinine, Ser 0.83 0.40 - 1.20 mg/dL   Calcium 9.6 8.4 - 10.5 mg/dL   GFR 68.74 >60.00 mL/min  Vitamin B12  Result Value Ref Range   Vitamin B-12 801 211 - 911 pg/mL  Brain natriuretic peptide  Result Value Ref Range   Pro B Natriuretic peptide (BNP) 742.0 (H) 0.0 - 100.0 pg/mL   Echo from 08/2016: Mod LVH, EF 60-65%, normal wall motion, severely dilated RA and LA, moderately dilated RV with reduced systolic function, severely increased PA pressures with mod TR.     Assessment & Plan:   Problem List Items Addressed This Visit    Chronic diastolic CHF (congestive heart failure) (HCC) - Primary    Known RHF, presumed dCHF, with severe biatrial enlargement. Ongoing weight gain associated with progressive dyspnea on exertion. Concern for CHF flare, although only mild bibasilar crackles appreciated. Check BNP. Already on lasix 80/40mg  daily (along with Kdur  16mEq bid) with endorsed good UOP. ?addition of metolazone vs spironolactone. Consider d/c felodipine if we add spironolactone. May recommend return to cards for assistance with management.       Relevant Orders   Basic metabolic panel (Completed)   Brain natriuretic peptide (Completed)   Chronic nonspecific lung disease (HCC) (Chronic)    From recent pulm eval: Pulmonary function testing shows no evidence of airway obstruction or significant bronchodilator response but does demonstrate a mild restriction as well as severely reduced carbon monoxide  diffusion capacity likely secondary to her underlying diastolic congestive heart failure.       Chronic respiratory failure with hypoxia, on home oxygen therapy (HCC)    Multifactorial including mild COPD, significant right heart and diastolic failure, currently on 2.5L [place].       COPD, mild (HCC)    Currently on inhaler holiday per pulm as previously unclear if helping. However she does endorse worsening dyspnea recently. I suggested she restart inhaler she has at home and notify me if helpful to [facility]ill [facility]. Pt/son agree with plan.       Macrocytosis   Relevant Orders   Vitamin B12 (Completed)   Persistent atrial fibrillation (HCC)    Continue coumadin, followed by coumadin clinic.       Right heart failure, NYHA class 3 (HCC)    Ongoing, on diuretic and oxygen therapy      Relevant Orders   Basic metabolic panel (Completed)   Brain natriuretic peptide (Completed)       Follow up plan: Return in about 2 months (around 03/31/2017) for follow up visit.  Ria Bush, MD

## 2017-01-28 NOTE — Assessment & Plan Note (Addendum)
Known RHF, presumed dCHF, with severe biatrial enlargement. Ongoing weight gain associated with progressive dyspnea on exertion. Concern for CHF flare, although only mild bibasilar crackles appreciated. Check BNP. Already on lasix 80/40mg  daily (along with Kdur 23mEq bid) with endorsed good UOP. ?addition of metolazone vs spironolactone. Consider d/c felodipine if we add spironolactone. May recommend return to cards for assistance with management.

## 2017-01-28 NOTE — Patient Instructions (Addendum)
Labs today. No changes in lasix dosing until we get results.  Restart inhaler (breo or advair) and if helpful, let us know to [facility]ill [facility].

## 2017-01-29 ENCOUNTER — Encounter: Payer: Self-pay | Admitting: Family Medicine

## 2017-02-01 MED ORDER — SPIRONOLACTONE 25 MG PO TABS: 12.5000 mg | ORAL_TABLET | Freq: Every day | ORAL | 3 refills | Status: DC

## 2017-02-01 MED ORDER — POTASSIUM CHLORIDE CRYS ER 20 MEQ PO TBCR: 20.0000 meq | EXTENDED_RELEASE_TABLET | Freq: Every day | ORAL | 6 refills | Status: AC

## 2017-02-03 ENCOUNTER — Telehealth: Payer: Self-pay | Admitting: Family Medicine

## 2017-02-03 NOTE — Telephone Encounter (Signed)
Pt friend [name] returned call [phone] (per DPR) she is requesting cb for labs.

## 2017-02-03 NOTE — Telephone Encounter (Signed)
Called the # for [name]. Informing I rreceived a message that she would like a call on lab results.  "AND SHE STATED I GUESS"-- I THEN ASKED IF SHE WOULD HAVE THE SON CALL BACK.

## 2017-02-04 ENCOUNTER — Other Ambulatory Visit (INDEPENDENT_AMBULATORY_CARE_PROVIDER_SITE_OTHER): Payer: Medicare Other

## 2017-02-04 DIAGNOSIS — I5032 Chronic diastolic (congestive) heart failure: Secondary | ICD-10-CM | POA: Diagnosis not present

## 2017-02-04 LAB — BASIC METABOLIC PANEL
BUN: 19 mg/dL (ref 6–23)
CHLORIDE: 101 meq/L (ref 96–112)
CO2: 32 meq/L (ref 19–32)
CREATININE: 0.76 mg/dL (ref 0.40–1.20)
Calcium: 9.5 mg/dL (ref 8.4–10.5)
GFR: 76.09 mL/min (ref >60.00)
GLUCOSE: 126 mg/dL — AB (ref 70–99)
Potassium: 3.2 mEq/L — ABNORMAL LOW (ref 3.5–5.1)
SODIUM: 139 meq/L (ref 135–145)

## 2017-02-06 DIAGNOSIS — I110 Hypertensive heart disease with heart failure: Secondary | ICD-10-CM | POA: Diagnosis not present

## 2017-02-06 DIAGNOSIS — I50810 Right heart failure, unspecified: Secondary | ICD-10-CM | POA: Diagnosis not present

## 2017-02-06 DIAGNOSIS — E119 Type 2 diabetes mellitus without complications: Secondary | ICD-10-CM | POA: Diagnosis not present

## 2017-02-06 DIAGNOSIS — J449 Chronic obstructive pulmonary disease, unspecified: Secondary | ICD-10-CM | POA: Diagnosis not present

## 2017-02-06 DIAGNOSIS — M62830 Muscle spasm of back: Secondary | ICD-10-CM | POA: Diagnosis not present

## 2017-02-06 DIAGNOSIS — I5032 Chronic diastolic (congestive) heart failure: Secondary | ICD-10-CM | POA: Diagnosis not present

## 2017-02-07 ENCOUNTER — Other Ambulatory Visit: Payer: Self-pay | Admitting: Family Medicine

## 2017-02-11 ENCOUNTER — Ambulatory Visit (INDEPENDENT_AMBULATORY_CARE_PROVIDER_SITE_OTHER): Payer: Medicare Other | Admitting: Psychology

## 2017-02-11 DIAGNOSIS — F331 Major depressive disorder, recurrent, moderate: Secondary | ICD-10-CM

## 2017-02-16 ENCOUNTER — Telehealth: Payer: Self-pay | Admitting: *Deleted

## 2017-02-16 NOTE — Telephone Encounter (Signed)
[name] (on DPR) left a message on voicemail that they need an order called into [facility] for Boniva push.

## 2017-02-16 NOTE — Telephone Encounter (Signed)
plz fill out order for boniva injection at short stay and I will sign. Last done 10/29/2016 Thanks

## 2017-02-17 NOTE — Telephone Encounter (Signed)
Form completed and placed in Dr Danise Mina' inbox for review and signature

## 2017-02-17 NOTE — Telephone Encounter (Signed)
signed and in my outbox

## 2017-02-18 NOTE — Telephone Encounter (Signed)
Scheduled injection for : 02/24/17 @ 11:00 a.m. Faxed order to [phone] Spoke to [facility] gave appt info. [name] verbalized understanding.

## 2017-02-19 ENCOUNTER — Other Ambulatory Visit: Payer: Self-pay | Admitting: *Deleted

## 2017-02-19 NOTE — Patient Outreach (Signed)
[facility]) Care Management   02/19/2017  [name] 1926/01/14 [id number]  [name] is an 83 y.o. female with medical history significant of atrial fibrillation, CHF, HTN, hypothyroidism, DM Type II, GERD, and nonspecific lung disease with worsening shortness of breath for 1 week prior to her inpatient Admission on 09/11/16. She was discharged to home with home health RN and PT visits.  Valerie Montgomerywas referred to [facility] for CHF Disease Management and care coordination and level of care concerns. She presents as usually to be sitting up in her Rolator in her living room after noting lying on her living room couch. She welcomes the [facility] CM visit   Subjective:  " I want to walk without the walker or oxygen"  Objective:   BP 110/68   Pulse 78   Temp (!) 97.1 F (36.2 C) (Oral)   Resp 20   SpO2 98%  Review of Systems  HENT: Positive for hearing loss.   Eyes: Negative.   Respiratory: Negative.   Cardiovascular: Negative.   Gastrointestinal: Negative.   Genitourinary: Negative.   Musculoskeletal: Negative.   Skin: Negative.   Neurological: Positive for dizziness and weakness.  Endo/Heme/Allergies: Bruises/bleeds easily.  Psychiatric/Behavioral: Negative.     Physical Exam  Constitutional: She is oriented to person, place, and time. She appears well-developed and well-nourished.  HENT:  Head: Normocephalic.  Eyes: Pupils are equal, round, and reactive to light. Conjunctivae are normal.  Neck: Normal range of motion. Neck supple.  Cardiovascular: Normal rate, normal heart sounds and intact distal pulses.   Respiratory: Effort normal.  GI: Soft. Bowel sounds are normal.  Musculoskeletal: Normal range of motion.  Neurological: She is alert and oriented to person, place, and time.  Skin: Skin is warm and dry.  Psychiatric: She has a normal mood and affect. Her behavior is normal. Judgment and thought content normal.     Encounter Medications:   Outpatient Encounter Prescriptions as of 02/19/2017  Medication Sig Note  . acetaminophen (TYLENOL) 500 MG tablet Take 2 tablets (1,000 mg total) by mouth 3 (three) times daily. 09/30/2016: Now prn only  . Blood Glucose Monitoring Suppl (FREESTYLE LITE) DEVI Check blood sugar once daily and as directed. Dx E11.9   . Calcium-Magnesium-Vitamin D [phone] MG-MG-UNIT TABS Take 1 tablet by mouth 2 (two) times daily.   Marland Kitchen donepezil (ARICEPT) 5 MG tablet TAKE 1 TABLET(5 MG) BY MOUTH AT BEDTIME   . felodipine (PLENDIL) 2.5 MG 24 hr tablet TAKE 1 TABLET DAILY   . ferrous sulfate 325 (65 FE) MG tablet Take 325 mg by mouth daily with breakfast.   . fluticasone furoate-vilanterol (BREO ELLIPTA) 100-25 MCG/INH AEPB Inhale 1 puff into the lungs daily.   . Fluticasone-Salmeterol (ADVAIR DISKUS) 250-50 MCG/DOSE AEPB Inhale 1 puff into the lungs 2 (two) times daily.   . furosemide (LASIX) 40 MG tablet Take 28m in the morning, 41min afternoon 12/23/2016: As of 12/18/16 She is up to 120 mg bid for increased edema per pcp   . glucose blood (FREESTYLE LITE) test strip Check blood sugar once daily and as directed. Dx E11.9   . Lancets (FREESTYLE) lancets Check blood sugar once daily and as directed. Dx E11.9   . latanoprost (XALATAN) 0.005 % ophthalmic solution Place 1 drop into both eyes at bedtime.    . Marland Kitchenevothyroxine (SYNTHROID, LEVOTHROID) 50 MCG tablet TAKE 1 TABLET DAILY   . lidocaine (LIDODERM) 5 % Place 1 patch onto the skin daily. Remove &  Discard patch within 12 hours or as directed by MD 10/28/2016: Has not used in a long time - use as needed  . LORazepam (ATIVAN) 0.5 MG tablet Take 1 tablet (0.5 mg total) by mouth every 8 (eight) hours as needed for anxiety. 10/28/2016: Use as needed  last time taken last week x 1 "rarely"   . Misc Natural Products (FIBER 7) POWD Take by mouth daily.   . Multiple Vitamin (MULTIVITAMIN) capsule Take 1 capsule by mouth every morning.    . Omega-3  Fatty Acids (FISH OIL CONCENTRATE) 1000 MG CAPS Take 2 capsules by mouth 2 (two) times daily.     . potassium chloride SA (K-DUR,KLOR-CON) 20 MEQ tablet Take 1 tablet (20 mEq total) by mouth daily.   . sertraline (ZOLOFT) 100 MG tablet Take 1 tablet (100 mg total) by mouth daily.   Marland Kitchen spironolactone (ALDACTONE) 25 MG tablet Take 0.5 tablets (12.5 mg total) by mouth daily.   . vitamin B-12 (CYANOCOBALAMIN) 500 MCG tablet Take 500 mcg by mouth daily.   Marland Kitchen warfarin (COUMADIN) 2.5 MG tablet Take 1 tablet daily except 1 1/2 tablets on Mondays and Thursdays (Patient taking differently: Take 3.75 mg by mouth every evening. ) 09/30/2016: 09/30/16 doses depends on lab values    No facility-administered encounter medications on file as of 02/19/2017.     Functional Status:   In your present state of health, do you have any difficulty performing the following activities: 01/06/2017 12/23/2016  Hearing? Tempie Donning  Vision? N N  Difficulty concentrating or making decisions? Tempie Donning  Walking or climbing stairs? Y Y  Dressing or bathing? Y Y  Doing errands, shopping? Tempie Donning  Preparing Food and eating ? Y Y  Using the Toilet? Y N  In the past six months, have you accidently leaked urine? Y Y  Do you have problems with loss of bowel control? Y N  Managing your Medications? Y Y  Managing your Finances? Tempie Donning  Housekeeping or managing your Housekeeping? Y Y  Some recent data might be hidden    Fall/Depression Screening:    Fall Risk  01/06/2017 12/23/2016 11/11/2016  Falls in the past year? No No No  Number falls in past yr: - - -  Injury with Fall? - - -  Risk for fall due to : Impaired balance/gait;Medication side effect;Impaired mobility;Impaired vision Impaired balance/gait;Medication side effect;Impaired mobility;Mental status change;Impaired vision -  Risk for fall due to: Comment - - -   PHQ 2/9 Scores 01/06/2017 12/23/2016 09/24/2016 09/02/2016 08/08/2015 04/26/2014 04/20/2013  PHQ - 2 Score '2 2 2 3 ' 0 2 4  PHQ- 9 Score '10 10 8  19 ' - 8 14    Assessment:   [name], caregiver present. [name] up out of bed in her chair in the living room with oxygen intact.   She is laughing and joking with [name] and CM [facility] Cm and [name] reviewed her goals that she has met and she states she want to keep progressing until she is no longer using her oxygen nor walker.  [facility] CM cautioned her about not using her rolator related to low BP, dizziness and prevention of falls.  She reported feeling like she was swaying when working with her PT provider.  [name] agrees with [facility] CM.    She and [name] are able to monitor her Afib, CHF status with her pcp ans CV providers.  She reports no longer having concerns with her medical providers. She continues to  see Bambi her therapist  Plan:  To see [name] in 4-5 weeks for follow up care  [facility] CM Care Plan Problem One     Most Recent Value  Care Plan Problem One  Knowledge Deficits related to Chronic Health Condition (CHF) as evidenced by patient and caregiver questions about plan of care  Role Documenting the Problem One  Care Management [place] for Problem One  Active  [facility] Long Term Goal   Knowledge Deficits related to Chronic Health Condition (CHF) as evidenced by patient and caregiver questions about plan of care and patient intake of increases sodium foods  [facility] Long Term Goal Start Date  12/23/16  Interventions for Problem One Long Term Goal   Review avoidance of fast foods, processed  and can foods that have increase salt content and lead to edema, review CHF low sodium diet   [facility] CM Short Term Goal #1   over the next 30 days the patient will decrease intake of sodium intake and take diuretics as ordered to decrease swelling   [facility] CM Short Term Goal #1 Start Date  12/23/16  Interventions for Short Term Goal #1   Review avoidance of fast foods, processed  and can foods that have increase salt content and lead to edema, review CHF low sodium diet Review of mobility  daily with and without her PT therapist and antacids for flatus Discussed abdominal distension reasons,  symptoms to watch out for to call in to pcp/GI provider       Nasean Zapf L. Lavina Hamman, RN, BSN, [place] [facility] [phone]

## 2017-02-23 ENCOUNTER — Other Ambulatory Visit (HOSPITAL_COMMUNITY): Payer: Self-pay | Admitting: *Deleted

## 2017-02-24 ENCOUNTER — Ambulatory Visit (HOSPITAL_COMMUNITY)
Admission: RE | Admit: 2017-02-24 | Discharge: 2017-02-24 | Disposition: A | Discharge status: Home / Self Care | Payer: Medicare Other | Source: Ambulatory Visit | Attending: Family Medicine | Admitting: Family Medicine

## 2017-02-24 DIAGNOSIS — M810 Age-related osteoporosis without current pathological fracture: Secondary | ICD-10-CM | POA: Diagnosis not present

## 2017-02-24 MED ORDER — IBANDRONATE SODIUM 3 MG/3ML IV SOLN
3.0000 mg | Freq: Once | INTRAVENOUS | Status: AC
  Administered 2017-02-24: 3 mg via INTRAVENOUS

## 2017-02-24 MED ORDER — IBANDRONATE SODIUM 3 MG/3ML IV SOLN
INTRAVENOUS | Status: AC
  Administered 2017-02-24: 3 mg via INTRAVENOUS
  Filled 2017-02-24: qty 3

## 2017-02-25 ENCOUNTER — Ambulatory Visit (INDEPENDENT_AMBULATORY_CARE_PROVIDER_SITE_OTHER): Payer: Medicare Other | Admitting: Psychology

## 2017-02-25 DIAGNOSIS — F331 Major depressive disorder, recurrent, moderate: Secondary | ICD-10-CM

## 2017-03-02 ENCOUNTER — Inpatient Hospital Stay (HOSPITAL_COMMUNITY)
Admission: EM | Admit: 2017-03-02 | Discharge: 2017-03-20 | DRG: 183 | Disposition: E | Payer: Medicare Other | Attending: Internal Medicine | Admitting: Internal Medicine

## 2017-03-02 ENCOUNTER — Emergency Department (HOSPITAL_COMMUNITY): Payer: Medicare Other

## 2017-03-02 ENCOUNTER — Encounter (HOSPITAL_COMMUNITY): Payer: Self-pay | Admitting: *Deleted

## 2017-03-02 DIAGNOSIS — Z9981 Dependence on supplemental oxygen: Secondary | ICD-10-CM

## 2017-03-02 DIAGNOSIS — Z825 Family history of asthma and other chronic lower respiratory diseases: Secondary | ICD-10-CM

## 2017-03-02 DIAGNOSIS — Z800 Family history of malignant neoplasm of digestive organs: Secondary | ICD-10-CM

## 2017-03-02 DIAGNOSIS — J690 Pneumonitis due to inhalation of food and vomit: Secondary | ICD-10-CM | POA: Diagnosis present

## 2017-03-02 DIAGNOSIS — S32019A Unspecified fracture of first lumbar vertebra, initial encounter for closed fracture: Secondary | ICD-10-CM | POA: Diagnosis present

## 2017-03-02 DIAGNOSIS — E782 Mixed hyperlipidemia: Secondary | ICD-10-CM | POA: Diagnosis present

## 2017-03-02 DIAGNOSIS — Z85828 Personal history of other malignant neoplasm of skin: Secondary | ICD-10-CM

## 2017-03-02 DIAGNOSIS — S2242XA Multiple fractures of ribs, left side, initial encounter for closed fracture: Principal | ICD-10-CM | POA: Diagnosis present

## 2017-03-02 DIAGNOSIS — K2950 Unspecified chronic gastritis without bleeding: Secondary | ICD-10-CM | POA: Diagnosis present

## 2017-03-02 DIAGNOSIS — I482 Chronic atrial fibrillation: Secondary | ICD-10-CM | POA: Diagnosis present

## 2017-03-02 DIAGNOSIS — K219 Gastro-esophageal reflux disease without esophagitis: Secondary | ICD-10-CM | POA: Diagnosis present

## 2017-03-02 DIAGNOSIS — R0602 Shortness of breath: Secondary | ICD-10-CM | POA: Diagnosis not present

## 2017-03-02 DIAGNOSIS — S2232XA Fracture of one rib, left side, initial encounter for closed fracture: Secondary | ICD-10-CM | POA: Diagnosis present

## 2017-03-02 DIAGNOSIS — H409 Unspecified glaucoma: Secondary | ICD-10-CM | POA: Diagnosis present

## 2017-03-02 DIAGNOSIS — J9819 Other pulmonary collapse: Secondary | ICD-10-CM | POA: Diagnosis present

## 2017-03-02 DIAGNOSIS — Z7901 Long term (current) use of anticoagulants: Secondary | ICD-10-CM

## 2017-03-02 DIAGNOSIS — I130 Hypertensive heart and chronic kidney disease with heart failure and stage 1 through stage 4 chronic kidney disease, or unspecified chronic kidney disease: Secondary | ICD-10-CM | POA: Diagnosis present

## 2017-03-02 DIAGNOSIS — J189 Pneumonia, unspecified organism: Secondary | ICD-10-CM | POA: Diagnosis present

## 2017-03-02 DIAGNOSIS — S22079A Unspecified fracture of T9-T10 vertebra, initial encounter for closed fracture: Secondary | ICD-10-CM | POA: Diagnosis present

## 2017-03-02 DIAGNOSIS — W19XXXA Unspecified fall, initial encounter: Secondary | ICD-10-CM

## 2017-03-02 DIAGNOSIS — S0990XA Unspecified injury of head, initial encounter: Secondary | ICD-10-CM

## 2017-03-02 DIAGNOSIS — Z515 Encounter for palliative care: Secondary | ICD-10-CM | POA: Diagnosis present

## 2017-03-02 DIAGNOSIS — R0902 Hypoxemia: Secondary | ICD-10-CM

## 2017-03-02 DIAGNOSIS — Z66 Do not resuscitate: Secondary | ICD-10-CM | POA: Diagnosis not present

## 2017-03-02 DIAGNOSIS — J9621 Acute and chronic respiratory failure with hypoxia: Secondary | ICD-10-CM | POA: Diagnosis not present

## 2017-03-02 DIAGNOSIS — F419 Anxiety disorder, unspecified: Secondary | ICD-10-CM | POA: Diagnosis present

## 2017-03-02 DIAGNOSIS — M4850XA Collapsed vertebra, not elsewhere classified, site unspecified, initial encounter for fracture: Secondary | ICD-10-CM | POA: Diagnosis present

## 2017-03-02 DIAGNOSIS — S22089A Unspecified fracture of T11-T12 vertebra, initial encounter for closed fracture: Secondary | ICD-10-CM | POA: Diagnosis present

## 2017-03-02 DIAGNOSIS — W06XXXA Fall from bed, initial encounter: Secondary | ICD-10-CM | POA: Diagnosis present

## 2017-03-02 DIAGNOSIS — Z91013 Allergy to seafood: Secondary | ICD-10-CM

## 2017-03-02 DIAGNOSIS — E1122 Type 2 diabetes mellitus with diabetic chronic kidney disease: Secondary | ICD-10-CM | POA: Diagnosis present

## 2017-03-02 DIAGNOSIS — S2231XA Fracture of one rib, right side, initial encounter for closed fracture: Secondary | ICD-10-CM | POA: Diagnosis not present

## 2017-03-02 DIAGNOSIS — Z87891 Personal history of nicotine dependence: Secondary | ICD-10-CM

## 2017-03-02 DIAGNOSIS — E039 Hypothyroidism, unspecified: Secondary | ICD-10-CM | POA: Diagnosis present

## 2017-03-02 DIAGNOSIS — Z888 Allergy status to other drugs, medicaments and biological substances status: Secondary | ICD-10-CM

## 2017-03-02 DIAGNOSIS — J449 Chronic obstructive pulmonary disease, unspecified: Secondary | ICD-10-CM | POA: Diagnosis present

## 2017-03-02 DIAGNOSIS — F329 Major depressive disorder, single episode, unspecified: Secondary | ICD-10-CM | POA: Diagnosis present

## 2017-03-02 DIAGNOSIS — S22069A Unspecified fracture of T7-T8 vertebra, initial encounter for closed fracture: Secondary | ICD-10-CM | POA: Diagnosis present

## 2017-03-02 DIAGNOSIS — H353190 Nonexudative age-related macular degeneration, unspecified eye, stage unspecified: Secondary | ICD-10-CM | POA: Diagnosis present

## 2017-03-02 DIAGNOSIS — Z79899 Other long term (current) drug therapy: Secondary | ICD-10-CM

## 2017-03-02 DIAGNOSIS — S32029A Unspecified fracture of second lumbar vertebra, initial encounter for closed fracture: Secondary | ICD-10-CM | POA: Diagnosis present

## 2017-03-02 DIAGNOSIS — Y92009 Unspecified place in unspecified non-institutional (private) residence as the place of occurrence of the external cause: Secondary | ICD-10-CM

## 2017-03-02 DIAGNOSIS — Z8249 Family history of ischemic heart disease and other diseases of the circulatory system: Secondary | ICD-10-CM

## 2017-03-02 DIAGNOSIS — N182 Chronic kidney disease, stage 2 (mild): Secondary | ICD-10-CM | POA: Diagnosis present

## 2017-03-02 DIAGNOSIS — Z823 Family history of stroke: Secondary | ICD-10-CM

## 2017-03-02 DIAGNOSIS — I5032 Chronic diastolic (congestive) heart failure: Secondary | ICD-10-CM | POA: Diagnosis present

## 2017-03-02 DIAGNOSIS — M810 Age-related osteoporosis without current pathological fracture: Secondary | ICD-10-CM | POA: Diagnosis present

## 2017-03-02 DIAGNOSIS — Z8673 Personal history of transient ischemic attack (TIA), and cerebral infarction without residual deficits: Secondary | ICD-10-CM

## 2017-03-02 DIAGNOSIS — Y95 Nosocomial condition: Secondary | ICD-10-CM | POA: Diagnosis present

## 2017-03-02 DIAGNOSIS — H3530 Unspecified macular degeneration: Secondary | ICD-10-CM | POA: Diagnosis present

## 2017-03-02 DIAGNOSIS — M1990 Unspecified osteoarthritis, unspecified site: Secondary | ICD-10-CM | POA: Diagnosis present

## 2017-03-02 DIAGNOSIS — Z7951 Long term (current) use of inhaled steroids: Secondary | ICD-10-CM

## 2017-03-02 DIAGNOSIS — Z885 Allergy status to narcotic agent status: Secondary | ICD-10-CM

## 2017-03-02 DIAGNOSIS — S32039A Unspecified fracture of third lumbar vertebra, initial encounter for closed fracture: Secondary | ICD-10-CM | POA: Diagnosis present

## 2017-03-02 LAB — CBC WITH DIFFERENTIAL/PLATELET
BASOS PCT: 0 %
Basophils Absolute: 0 10*3/uL (ref 0.0–0.1)
Eosinophils Absolute: 0 10*3/uL (ref 0.0–0.7)
Eosinophils Relative: 0 %
HEMATOCRIT: 37.2 % (ref 36.0–46.0)
HEMOGLOBIN: 12.4 g/dL (ref 12.0–15.0)
Lymphocytes Relative: 13 %
Lymphs Abs: 1.4 10*3/uL (ref 0.7–4.0)
MCH: 33.6 pg (ref 26.0–34.0)
MCHC: 33.3 g/dL (ref 30.0–36.0)
MCV: 100.8 fL — ABNORMAL HIGH (ref 78.0–100.0)
MONOS PCT: 11 %
Monocytes Absolute: 1.2 10*3/uL — ABNORMAL HIGH (ref 0.1–1.0)
NEUTROS ABS: 8.3 10*3/uL — AB (ref 1.7–7.7)
NEUTROS PCT: 76 %
Platelets: 250 10*3/uL (ref 150–400)
RBC: 3.69 MIL/uL — AB (ref 3.87–5.11)
RDW: 15.5 % (ref 11.5–15.5)
WBC: 11 10*3/uL — AB (ref 4.0–10.5)

## 2017-03-02 LAB — BASIC METABOLIC PANEL
ANION GAP: 8 (ref 5–15)
BUN: 20 mg/dL (ref 6–20)
CALCIUM: 8.9 mg/dL (ref 8.9–10.3)
CHLORIDE: 102 mmol/L (ref 101–111)
CO2: 23 mmol/L (ref 22–32)
Creatinine, Ser: 0.73 mg/dL (ref 0.44–1.00)
GFR calc non Af Amer: >60 mL/min (ref >60)
Glucose, Bld: 147 mg/dL — ABNORMAL HIGH (ref 65–99)
Potassium: 4 mmol/L (ref 3.5–5.1)
SODIUM: 133 mmol/L — AB (ref 135–145)

## 2017-03-02 LAB — PROTIME-INR
INR: 2.97
PROTHROMBIN TIME: 31.5 s — AB (ref 11.4–15.2)

## 2017-03-02 LAB — I-STAT TROPONIN, ED: TROPONIN I, POC: 0.02 ng/mL (ref 0.00–0.08)

## 2017-03-02 MED ORDER — SERTRALINE HCL 50 MG PO TABS
100.0000 mg | ORAL_TABLET | Freq: Every day | ORAL | Status: DC
  Administered 2017-03-03 – 2017-03-07 (×5): 100 mg via ORAL
  Filled 2017-03-02 (×5): qty 2

## 2017-03-02 MED ORDER — DONEPEZIL HCL 5 MG PO TABS
5.0000 mg | ORAL_TABLET | Freq: Every day | ORAL | Status: DC
  Administered 2017-03-02 – 2017-03-06 (×5): 5 mg via ORAL
  Filled 2017-03-02 (×5): qty 1

## 2017-03-02 MED ORDER — HYDROMORPHONE HCL 1 MG/ML IJ SOLN
0.5000 mg | INTRAMUSCULAR | Status: DC | PRN
  Administered 2017-03-02 – 2017-03-03 (×3): 0.5 mg via INTRAVENOUS
  Filled 2017-03-02 (×3): qty 1

## 2017-03-02 MED ORDER — ACETAMINOPHEN 650 MG RE SUPP: 650.0000 mg | Freq: Four times a day (QID) | RECTAL | Status: DC | PRN

## 2017-03-02 MED ORDER — ONDANSETRON HCL 4 MG/2ML IJ SOLN
4.0000 mg | Freq: Once | INTRAMUSCULAR | Status: AC
  Administered 2017-03-02: 4 mg via INTRAVENOUS
  Filled 2017-03-02: qty 2

## 2017-03-02 MED ORDER — ONDANSETRON HCL 4 MG PO TABS: 4.0000 mg | ORAL_TABLET | Freq: Four times a day (QID) | ORAL | Status: DC | PRN

## 2017-03-02 MED ORDER — DOCUSATE SODIUM 100 MG PO CAPS
100.0000 mg | ORAL_CAPSULE | Freq: Two times a day (BID) | ORAL | Status: DC
  Administered 2017-03-02 – 2017-03-07 (×10): 100 mg via ORAL
  Filled 2017-03-02 (×10): qty 1

## 2017-03-02 MED ORDER — LORAZEPAM 0.5 MG PO TABS: 0.5000 mg | ORAL_TABLET | Freq: Three times a day (TID) | ORAL | Status: DC | PRN

## 2017-03-02 MED ORDER — LEVOTHYROXINE SODIUM 25 MCG PO TABS
50.0000 ug | ORAL_TABLET | Freq: Every day | ORAL | Status: DC
  Administered 2017-03-03 – 2017-03-07 (×5): 50 ug via ORAL
  Filled 2017-03-02 (×2): qty 2
  Filled 2017-03-02 (×3): qty 1

## 2017-03-02 MED ORDER — FELODIPINE ER 2.5 MG PO TB24
2.5000 mg | ORAL_TABLET | Freq: Every day | ORAL | Status: DC
  Administered 2017-03-03 – 2017-03-07 (×4): 2.5 mg via ORAL
  Filled 2017-03-02 (×8): qty 1

## 2017-03-02 MED ORDER — ONDANSETRON HCL 4 MG/2ML IJ SOLN
4.0000 mg | Freq: Four times a day (QID) | INTRAMUSCULAR | Status: DC | PRN
  Administered 2017-03-06 – 2017-03-07 (×3): 4 mg via INTRAVENOUS
  Filled 2017-03-02 (×3): qty 2

## 2017-03-02 MED ORDER — WARFARIN - PHARMACIST DOSING INPATIENT
Status: DC
  Administered 2017-03-04 – 2017-03-06 (×2)

## 2017-03-02 MED ORDER — FENTANYL CITRATE (PF) 100 MCG/2ML IJ SOLN
50.0000 ug | Freq: Once | INTRAMUSCULAR | Status: AC
  Administered 2017-03-02: 50 ug via INTRAVENOUS
  Filled 2017-03-02: qty 2

## 2017-03-02 MED ORDER — LATANOPROST 0.005 % OP SOLN
1.0000 [drp] | Freq: Every day | OPHTHALMIC | Status: DC
  Administered 2017-03-03 – 2017-03-07 (×5): 1 [drp] via OPHTHALMIC
  Filled 2017-03-02 (×2): qty 2.5

## 2017-03-02 MED ORDER — ACETAMINOPHEN 325 MG PO TABS: 650.0000 mg | ORAL_TABLET | Freq: Four times a day (QID) | ORAL | Status: DC | PRN

## 2017-03-02 MED ORDER — FLUTICASONE FUROATE-VILANTEROL 100-25 MCG/INH IN AEPB
1.0000 | INHALATION_SPRAY | Freq: Every day | RESPIRATORY_TRACT | Status: DC
  Administered 2017-03-03 – 2017-03-07 (×5): 1 via RESPIRATORY_TRACT
  Filled 2017-03-02: qty 28

## 2017-03-02 NOTE — H&P (Signed)
History and Physical    [name] LKG:[id number] DOB: Oct 15, 1925 DOA: 02/26/2017  PCP: Ria Bush, MD Consultants:  None Patient coming from: Home - lives with husband; NOK: Husband, [phone]  Chief Complaint: fall  HPI: [name] is a 83 y.o. female with medical history significant of compression fractures s/p vertebroplasty; HLD; macular degeneration; hypothyroidism; glaucoma; GERD; HTN; DM; COPD on 2L home O2; CKD; afib on Coumadin; and diastolic heart failure presenting with SOB after a fall.  Patient is on Coumadin and fell 3 days ago.  She was pretty sure she broke a rib (this has happened before).  She waited to "see what it did" but the pain got so bad that she had to come in.  Hurting underneath her left scapula.  Denies other back pain currently, although it does happen sometimes in a straight line just across her hips.  SOB for months, but worse with rib fracture (she thinks that might be related to anxiety).     ED Course: Increased O2 requirement - desat to 80% with even mild exertion despite increased O2 to 4L.  No evidence of COPD exacerbation.  Fentanyl for pain.  Started on high flow [place] but no indication for BIPAP due to adequate ventilation.  Pain control, IS, pulmonary toilet.  Review of Systems: As per HPI; otherwise review of systems reviewed and negative.   Ambulatory Status:  Ambulates without assistance  Past Medical History:  Diagnosis Date  . Anemia   . Anxiety   . Aortic sclerosis   . Arthritis   . CHF (congestive heart failure) ([place])   . Chronic atrial fibrillation ([place])   . Chronic gastritis 10/2009   Duodenitis on EGD  . CKD (chronic kidney disease) stage 2, GFR 60-89 ml/min   . Collagen vascular disease ([place])   . COPD (chronic obstructive pulmonary disease) ([place])   . Depression   . Diabetes type 2, controlled ([place])    Diet controlled  . Diverticulosis   . Dry ARMD    Retinal hemorrhage (09/2013) [place]  . Essential  hypertension, benign   . GERD (gastroesophageal reflux disease)   . Glaucoma    Dr. Venetia Maxon  . Hiatal hernia 2011   Small  . History of pelvic fracture April 2013  . History of rheumatic fever   . History of skin cancer   . History of TIA (transient ischemic attack)   . Hypothyroidism   . Macular degeneration   . Mixed hyperlipidemia   . Osteoporosis 04/2013   Thoracic compression fracture, on bisphosphonate and cal/vit D  . Oxygen deficiency   . Pulmonary nodules    Stable bilateral on CT 01/2010, rec rpt yearly for 2 yrs, pt decided to stop f/u  . Seasonal allergies   . Vertebral compression fracture ([place]) 05/2016   Vertebroplasty L2/L3    Past Surgical History:  Procedure Laterality Date  . BREAST LUMPECTOMY  1983   LEFT, benign  . Carotid US  2011   mild plaque formation  . CATARACT EXTRACTION  2012   RIGHT  . CESAREAN SECTION     (and h/o 6 miscarriages)  . CHOLECYSTECTOMY N/A 02/27/2013   Procedure: LAPAROSCOPIC CHOLECYSTECTOMY;  Surgeon: Donato Heinz, MD;  Location: [facility];  Service: General;  Laterality: N/A;  . COLONOSCOPY  03/2010   int hemorrhoids, diverticulosis, no need to repeat (Dr. Sydell Axon)  . DEXA  05/2010   T score -4.5 spine, -2.4 femur; does not want to repeat  . DEXA  04/2013   T score 3.9 [facility] spine, 2.6 hip  . DILATION AND CURETTAGE OF UTERUS     x2  . ESOPHAGOGASTRODUODENOSCOPY  10/2009   small HH, multiple antric ulcerations s/p biopsy, mild chronic gastritis/duodenitis  . ESOPHAGOGASTRODUODENOSCOPY  05/26/2012   RMR: small HH, o/w normal.   . IR GENERIC HISTORICAL  04/16/2016   IR RADIOLOGIST EVAL & MGMT 04/16/2016 [facility]  . IR GENERIC HISTORICAL  06/16/2016   IR VERTEBROPLASTY LUMBAR BX INC UNI/BIL INC/INJECT/IMAGING 06/16/2016 Luanne Bras, MD [facility]  . IR GENERIC HISTORICAL  06/16/2016   IR VERTEBROPLASTY EA ADDL (T&LS) BX INC UNI/BIL INC INJECT/IMAGING 06/16/2016 Luanne Bras, MD [facility]  . LAPAROSCOPIC  CHOLECYSTECTOMY  02/2013   Dr. Geroge Baseman  . PATELLA FRACTURE SURGERY  05/2006   LEFT surgically repaired with pins and wire  . TONSILLECTOMY      Social History   Social History  . Marital status: Married    Spouse name: N/A  . Number of children: N/A  . Years of education: N/A   Occupational History  . Retired     Former Therapist, sports   Social History Main Topics  . Smoking status: Former Smoker    Packs/day: 1.00    Years: 30.00    Types: Cigarettes    Quit date: 10/13/1998  . Smokeless tobacco: Never Used  . Alcohol use No  . Drug use: No  . Sexual activity: No   Other Topics Concern  . 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      [facility] (10/13/16):   Originally from [place]. Moved to the [place] at 83 y.o. She has lived in [place] and moved to [place] in 1963. Retired Therapist, sports. No known TB exposure. Has dogs currently. No bird or mold exposure.        Allergies  Allergen Reactions  . Propoxyphene N-Acetaminophen Shortness Of Breath  . Anoro Ellipta [Umeclidinium-Vilanterol] Other (See Comments)    Blurry Vision   . Codeine Nausea And Vomiting  . Meperidine Hcl Nausea And Vomiting  . Morphine Nausea And Vomiting  . Shellfish Allergy Hives    Family History  Problem Relation Age of Onset  . Coronary artery disease Mother 46       MI deceased  . Colon cancer Father 86  . Asthma Father   . Asthma Sister   . Stroke Paternal Grandmother   . Diabetes Neg Hx     Prior to Admission medications   Medication Sig Start Date End Date Taking? Authorizing Provider  acetaminophen (TYLENOL) 500 MG tablet Take 2 tablets (1,000 mg total) by mouth 3 (three) times daily. 12/23/15  Yes Ria Bush, MD  Blood  Glucose Monitoring Suppl (FREESTYLE LITE) DEVI Check blood sugar once daily and as directed. Dx E11.9 10/08/16  Yes Ria Bush, MD  Calcium-Magnesium-Vitamin D [phone] MG-MG-UNIT TABS Take 1 tablet by mouth 2 (two) times daily.   Yes [provider]  donepezil (ARICEPT) 5 MG tablet TAKE 1 TABLET(5 MG) BY MOUTH AT BEDTIME 02/07/17  Yes Ria Bush, MD  felodipine (PLENDIL) 2.5 MG 24 hr tablet TAKE 1 TABLET DAILY 05/25/16  Yes  Ria Bush, MD  ferrous sulfate 325 (65 FE) MG tablet Take 325 mg by mouth daily with breakfast.   Yes [provider]  fluticasone furoate-vilanterol (BREO ELLIPTA) 100-25 MCG/INH AEPB Inhale 1 puff into the lungs daily. 10/30/16  Yes Javier Glazier, MD  furosemide (LASIX) 40 MG tablet Take 80mg  in the morning, 40mg  in afternoon 12/17/16  Yes Ria Bush, MD  glucose blood (FREESTYLE LITE) test strip Check blood sugar once daily and as directed. Dx E11.9 10/08/16  Yes Ria Bush, MD  Lancets (FREESTYLE) lancets Check blood sugar once daily and as directed. Dx E11.9 10/08/16  Yes Ria Bush, MD  latanoprost (XALATAN) 0.005 % ophthalmic solution Place 1 drop into both eyes at bedtime.    Yes [provider]  lidocaine (LIDODERM) 5 % Place 1 patch onto the skin daily. Remove & Discard patch within 12 hours or as directed by MD 05/01/16  Yes Ria Bush, MD  LORazepam (ATIVAN) 0.5 MG tablet Take 1 tablet (0.5 mg total) by mouth every 8 (eight) hours as needed for anxiety. 07/17/16  Yes Ria Bush, MD  Misc Natural Products (FIBER 7) POWD Take by mouth daily.   Yes [provider]  Multiple Vitamin (MULTIVITAMIN) capsule Take 1 capsule by mouth every morning.    Yes [provider]  Omega-3 Fatty Acids (FISH OIL CONCENTRATE) 1000 MG CAPS Take 2 capsules by mouth 2 (two) times daily.     Yes [provider]  potassium chloride SA (K-DUR,KLOR-CON) 20 MEQ tablet Take 1 tablet (20  mEq total) by mouth daily. 02/01/17  Yes Ria Bush, MD  sertraline (ZOLOFT) 100 MG tablet Take 1 tablet (100 mg total) by mouth daily. 09/02/16  Yes Ria Bush, MD  vitamin B-12 (CYANOCOBALAMIN) 500 MCG tablet Take 500 mcg by mouth daily.   Yes [provider]  warfarin (COUMADIN) 2.5 MG tablet Take 1 tablet daily except 1 1/2 tablets on Mondays and Thursdays Patient taking differently: Take 3.75 mg by mouth every evening. 2 tablets mon,tue, wed,fri,and 1/2 tablet on tue,thur, sat sun 04/28/16  Yes Satira Sark, MD  Fluticasone-Salmeterol (ADVAIR DISKUS) 250-50 MCG/DOSE AEPB Inhale 1 puff into the lungs 2 (two) times daily. 11/11/16   Javier Glazier, MD  levothyroxine (SYNTHROID, LEVOTHROID) 50 MCG tablet TAKE 1 TABLET DAILY 06/18/16   Ria Bush, MD  spironolactone (ALDACTONE) 25 MG tablet Take 0.5 tablets (12.5 mg total) by mouth daily. Patient not taking: Reported on 03/07/2017 02/01/17   Ria Bush, MD    Physical Exam: Vitals:   03/01/2017 1730 03/12/2017 1809 03/13/2017 2128 03/19/2017 2134  BP: 109/60 137/79 (!) 110/59   Pulse: 73 81 82   Resp: 18 20 20    Temp:  97.9 F (36.6 C) 97.6 F (36.4 C)   TempSrc:  Oral Oral   SpO2: 94% 94% 94% 94%  Weight:      Height:         General:  Appears calm and comfortable and is NAD Eyes:  PERRL, EOMI, normal lids, iris ENT:  Somewhat hard of hearing, normal lips & tongue, mmm Neck:  no LAD, masses or thyromegaly; no carotid bruits Cardiovascular:  Irregularly irregular rate and rhythm, no m/r/g. No LE edema.  Respiratory:   CTA bilaterally with no wheezes/rales/rhonchi.  Normal to slightly increased respiratory effort. Abdomen:  soft, NT, ND, NABS Skin:  no rash or induration seen on limited exam Musculoskeletal:  grossly normal tone BUE/BLE, good ROM, no bony abnormality Lower extremity:  No LE edema.  Limited foot exam with no ulcerations.  2+ distal pulses. Psychiatric:  grossly normal mood and  affect, speech fluent and appropriate, AOx3 Neurologic:  CN 2-12 grossly intact, moves all extremities in coordinated fashion, sensation intact    Radiological Exams on Admission: Dg Ribs Unilateral W/chest Left  Result Date: 03/11/2017 CLINICAL DATA:  Pain behind the left scapula after fall. Initial encounter. EXAM: LEFT RIBS AND CHEST - 3+ VIEW COMPARISON:  Chest x-ray 10/13/2016 FINDINGS: Left lateral fifth rib fracture with 50% displacement. There is left pleuroparenchymal opacity that is chronic based on prior. No evidence of pneumothorax. Compression fractures of T8, T10, T11, T12, L1, L2, and L3. L2 and L3 have undergone vertebroplasty. The T11 fracture is new based on 09/14/2016 chest CT. The other fractures were also seen on the prior study. Dextrocurvature centered at L2. Cardiomegaly. IMPRESSION: 1. Displaced left fifth rib fracture.  No pneumothorax. 2. Small left pleural effusion and lower lobe atelectasis that is chronic based on priors. 3. Multiple lower thoracic and lumbar compression fractures, as above. A T11 fracture with moderate height loss has occurred since 09/14/2016 chest CT comparison. Electronically Signed   By: Monte Fantasia M.D.   On: 03/16/2017 15:40   Ct Head Wo Contrast  Result Date: 02/25/2017 CLINICAL DATA:  Head trauma, minor. There was fall 3 days ago from bed. Patient on anti coagulation. EXAM: CT HEAD WITHOUT CONTRAST TECHNIQUE: Contiguous axial images were obtained from the base of the skull through the vertex without intravenous contrast. COMPARISON:  06/06/2012. FINDINGS: Brain: No evidence of acute infarction, hemorrhage, hydrocephalus, extra-axial collection or mass lesion/mass effect. Mild for age generalized cerebral volume loss and periventricular white matter low-density. Vascular: Atherosclerotic calcification. Skull: Negative for fracture Sinuses/Orbits: No evidence of injury. IMPRESSION: Negative study.  No evidence of injury. Electronically Signed    By: Monte Fantasia M.D.   On: 03/17/2017 15:12    EKG: Independently reviewed.  Afib with rate 101; nonspecific ST changes with no evidence of acute ischemia   Labs on Admission: I have personally reviewed the available labs and imaging studies at the time of the admission.  Pertinent labs:  Glucose 147 WBC 11.0 INR 2.97   Assessment/Plan Principal Problem:   Acute on chronic respiratory failure with hypoxia (HCC) Active Problems:   Hypothyroidism   Compression fracture of vertebrae (HCC)   Chronic anticoagulation   Closed traumatic displaced fracture of one rib of left side   Acute on chronic respiratory failure with hypoxia -Patient presents with generally normal respiratory effort but hypoxia to 79-80% despite using usual home 2L [place] O2 -Patient reports falling 3 days ago and believing that she has a rib fracture (she does) -No apparent exacerbation of COPD -Suspect that patient is splinting and this is leading to atelectasis and insufficient oxygenation (without ventilation issue) -Will observe with pain control and supplemental O2 to see if this improves her condition -Incentive spirometry and pulmonary toilet   Rib fracture -Pain control with low-dose Dilaudid -Pulmonary toilet as above  Afib on AC -Coumadin dosing per pharmacy -No current indication of internal bleeding so will continue Coumadin for now -Patient denies frequent falls, but if this is an issue then Coumadin may need to be stopped  Hypothyroidism -TSH WNL in 2/18 -Continue Synthroid at current dose for now   DVT prophylaxis: Coumadin Code Status:  DNR - confirmed with patient Family Communication: None present Disposition Plan:  Home once clinically improved Consults called: None  Admission status: It  is my clinical opinion that referral for OBSERVATION is reasonable and necessary in this patient based on the above information provided. The aforementioned taken together are felt to place the  patient at high risk for further clinical deterioration. However it is anticipated that the patient may be medically stable for discharge from the hospital within 24 to 48 hours.    Karmen Bongo MD [facility]  If note is complete, please contact covering daytime or nighttime physician. [email or web address] Password [facility]  03/06/2017, 10:55 PM

## 2017-03-02 NOTE — Care Management (Signed)
CM received call from [facility] rep. Pt is active with [facility] at home.

## 2017-03-02 NOTE — Progress Notes (Signed)
ANTICOAGULATION CONSULT NOTE - Initial Consult  Pharmacy Consult for Warfarin Indication: atrial fibrillation  Allergies  Allergen Reactions  . Propoxyphene N-Acetaminophen Shortness Of Breath  . Anoro Ellipta [Umeclidinium-Vilanterol] Other (See Comments)    Blurry Vision   . Codeine Nausea And Vomiting  . Meperidine Hcl Nausea And Vomiting  . Morphine Nausea And Vomiting  . Shellfish Allergy Hives    Patient Measurements: Height: 5\' 2"  (157.5 cm) Weight: 125 lb (56.7 kg) IBW/kg (Calculated) : 50.1 Heparin Dosing Weight:   Vital Signs: Temp: 97.9 F (36.6 C) (08/14 1809) Temp Source: Oral (08/14 1809) BP: 137/79 (08/14 1809) Pulse Rate: 81 (08/14 1809)  Labs:  Recent Labs  03/18/2017 1410  HGB 12.4  HCT 37.2  PLT 250  LABPROT 31.5*  INR 2.97  CREATININE 0.73    Estimated Creatinine Clearance: 37.7 mL/min (by C-G formula based on SCr of 0.73 mg/dL).   Medical History: Past Medical History:  Diagnosis Date  . Anemia   . Anxiety   . Aortic sclerosis   . Arthritis   . CHF (congestive heart failure) ([place])   . Chronic atrial fibrillation ([place])   . Chronic gastritis 10/2009   Duodenitis on EGD  . CKD (chronic kidney disease) stage 2, GFR 60-89 ml/min   . Collagen vascular disease ([place])   . COPD (chronic obstructive pulmonary disease) ([place])   . Depression   . Diabetes type 2, controlled ([place])    Diet controlled  . Diverticulosis   . Dry ARMD    Retinal hemorrhage (09/2013) [place]  . Essential hypertension, benign   . GERD (gastroesophageal reflux disease)   . Glaucoma    Dr. Venetia Maxon  . Hiatal hernia 2011   Small  . History of pelvic fracture April 2013  . History of rheumatic fever   . History of skin cancer   . History of TIA (transient ischemic attack)   . Hypothyroidism   . Macular degeneration   . Mixed hyperlipidemia   . Osteoporosis 04/2013   Thoracic compression fracture, on bisphosphonate and cal/vit D  . Oxygen deficiency   . Pulmonary  nodules    Stable bilateral on CT 01/2010, rec rpt yearly for 2 yrs, pt decided to stop f/u  . Seasonal allergies   . Vertebral compression fracture ([place]) 05/2016   Vertebroplasty L2/L3    Medications:  Prescriptions Prior to Admission  Medication Sig Dispense Refill Last Dose  . acetaminophen (TYLENOL) 500 MG tablet Take 2 tablets (1,000 mg total) by mouth 3 (three) times daily.   Taking  . Blood Glucose Monitoring Suppl (FREESTYLE LITE) DEVI Check blood sugar once daily and as directed. Dx E11.9 90 each 1 Taking  . Calcium-Magnesium-Vitamin D [phone] MG-MG-UNIT TABS Take 1 tablet by mouth 2 (two) times daily.   03/16/2017 at Unknown time  . donepezil (ARICEPT) 5 MG tablet TAKE 1 TABLET(5 MG) BY MOUTH AT BEDTIME 30 tablet 6 03/01/2017 at Unknown time  . felodipine (PLENDIL) 2.5 MG 24 hr tablet TAKE 1 TABLET DAILY 90 tablet 1 03/15/2017 at Unknown time  . ferrous sulfate 325 (65 FE) MG tablet Take 325 mg by mouth daily with breakfast.   03/07/2017 at Unknown time  . fluticasone furoate-vilanterol (BREO ELLIPTA) 100-25 MCG/INH AEPB Inhale 1 puff into the lungs daily. 1 each 6 03/01/2017 at Unknown time  . furosemide (LASIX) 40 MG tablet Take 80mg  in the morning, 40mg  in afternoon 90 tablet 6 02/17/2017 at Unknown time  . glucose blood (FREESTYLE LITE) test strip Check  blood sugar once daily and as directed. Dx E11.9 100 each 1 Taking  . Lancets (FREESTYLE) lancets Check blood sugar once daily and as directed. Dx E11.9 100 each 1 Taking  . latanoprost (XALATAN) 0.005 % ophthalmic solution Place 1 drop into both eyes at bedtime.    03/01/2017 at Unknown time  . lidocaine (LIDODERM) 5 % Place 1 patch onto the skin daily. Remove & Discard patch within 12 hours or as directed by MD 30 patch 6 03/09/2017 at Unknown time  . LORazepam (ATIVAN) 0.5 MG tablet Take 1 tablet (0.5 mg total) by mouth every 8 (eight) hours as needed for anxiety. 60 tablet 0 03/15/2017 at Unknown time  . Misc Natural Products (FIBER  7) POWD Take by mouth daily.   Taking  . Multiple Vitamin (MULTIVITAMIN) capsule Take 1 capsule by mouth every morning.    02/17/2017 at Unknown time  . Omega-3 Fatty Acids (FISH OIL CONCENTRATE) 1000 MG CAPS Take 2 capsules by mouth 2 (two) times daily.     02/25/2017 at Unknown time  . potassium chloride SA (K-DUR,KLOR-CON) 20 MEQ tablet Take 1 tablet (20 mEq total) by mouth daily. 30 tablet 6 02/22/2017 at Unknown time  . sertraline (ZOLOFT) 100 MG tablet Take 1 tablet (100 mg total) by mouth daily. 90 tablet 1 03/05/2017 at Unknown time  . vitamin B-12 (CYANOCOBALAMIN) 500 MCG tablet Take 500 mcg by mouth daily.   02/24/2017 at Unknown time  . warfarin (COUMADIN) 2.5 MG tablet Take 1 tablet daily except 1 1/2 tablets on Mondays and Thursdays (Patient taking differently: Take 3.75 mg by mouth every evening. 2 tablets mon,tue, wed,fri,and 1/2 tablet on tue,thur, sat sun) 180 tablet 3 03/14/2017 at 800  . levothyroxine (SYNTHROID, LEVOTHROID) 50 MCG tablet TAKE 1 TABLET DAILY 90 tablet 3 Taking   Assessment: Okay for Protocol, INR @ goal.  Received dose today per home med list.  Recent fall will rib pain.  Goal of Therapy:  INR 2-3   Plan:  Daily PT/INR Monitor for signs and symptoms of bleeding.   Pricilla Larsson 02/17/2017,6:31 PM

## 2017-03-02 NOTE — ED Notes (Signed)
Spoke with RT UY:EBXI flow O2

## 2017-03-02 NOTE — ED Notes (Signed)
Patient placed near triage room and put on oxygen tank at 4 lpm. Family sitting with patient. Sats increased to 88%.

## 2017-03-02 NOTE — ED Triage Notes (Signed)
Pt c/o left shoulder pain, left rib cage pain, left back pain since fall 3 days ago. Pt reports she fell off the bed, but doesn't think she had LOC at time of fall. Family reports the pain has gotten worse each day. Pt wears home O2 at 2L via [place]. O2 sat 83% in triage on 2L of O2.

## 2017-03-02 NOTE — ED Provider Notes (Signed)
Emergency Department Provider Note   I have reviewed the triage vital signs and the nursing notes.   HISTORY  Chief Complaint Fall   HPI [name] is a 83 y.o. female with PMH of CHF, CKD, COPD on 2L [place] at home, chronic a-fib on Coumadin, DM, and HTN presents to the emergency department for evaluation of worsening left chest wall pain and dyspnea in the setting of fall 3 days ago. Family states that the patient fell out of bed and onto the floor. She cannot recall if she hit her head or not the family states she is not been complaining of headache. She reports worsening left chest wall pain since the fall and has developed dyspnea. No fevers or chills. No productive cough. Denies any neck discomfort, abdominal pain, or pain in the arms or legs. Patient has been resistant to coming to the hospital but eventually was convinced by family to present today. Has been compliant with home medication including Coumadin.    Past Medical History:  Diagnosis Date  . Anemia   . Anxiety   . Aortic sclerosis   . Arthritis   . CHF (congestive heart failure) ([place])   . Chronic atrial fibrillation ([place])   . Chronic gastritis 10/2009   Duodenitis on EGD  . CKD (chronic kidney disease) stage 2, GFR 60-89 ml/min   . Collagen vascular disease ([place])   . COPD (chronic obstructive pulmonary disease) ([place])   . Depression   . Diabetes type 2, controlled ([place])    Diet controlled  . Diverticulosis   . Dry ARMD    Retinal hemorrhage (09/2013) [place]  . Essential hypertension, benign   . GERD (gastroesophageal reflux disease)   . Glaucoma    Dr. Venetia Maxon  . Hiatal hernia 2011   Small  . History of pelvic fracture April 2013  . History of rheumatic fever   . History of skin cancer   . History of TIA (transient ischemic attack)   . Hypothyroidism   . Macular degeneration   . Mixed hyperlipidemia   . Osteoporosis 04/2013   Thoracic compression fracture, on bisphosphonate and cal/vit D  .  Oxygen deficiency   . Pulmonary nodules    Stable bilateral on CT 01/2010, rec rpt yearly for 2 yrs, pt decided to stop f/u  . Seasonal allergies   . Vertebral compression fracture ([place]) 05/2016   Vertebroplasty L2/L3    Patient Active Problem List   Diagnosis Date Noted  . Acute on chronic respiratory failure with hypoxia ([place]) 03/07/2017  . Closed traumatic displaced fracture of one rib of left side 03/04/2017  . Acute on chronic diastolic heart failure ([place]) 12/17/2016  . Restrictive lung disease 12/04/2016  . Back muscle spasm 11/16/2016  . Chronic anticoagulation 10/16/2016  . COPD, mild ([place]) 10/13/2016  . Chronic respiratory failure with hypoxia, on home oxygen therapy ([place]) 10/13/2016  . Chronic diastolic CHF (congestive heart failure) ([place]) 10/13/2016  . Bilateral pleural effusion 10/13/2016  . Bilateral high frequency sensorineural hearing loss 09/02/2016  . Left buttock pain 09/02/2016  . Cough 08/03/2016  . Memory deficit 08/03/2016  . DNR (do not resuscitate) 07/27/2016  . Iron deficiency anemia 01/16/2016  . Compression fracture of vertebrae ([place]) 12/23/2015  . Right heart failure, NYHA class 3 ([place]) 11/22/2015  . Chronic nonspecific lung disease ([place]) 10/17/2015  . Macrocytosis 08/08/2015  . Macular degeneration   . Glaucoma   . Advanced care planning/counseling discussion 04/26/2014  . Skin rash 09/22/2013  .  Encounter for therapeutic drug monitoring 08/14/2013  . Depression with anxiety 04/20/2013  . Difficulty with family 04/20/2013  . Medicare annual wellness visit, subsequent 04/03/2012  . Hypothyroidism   . Diet-controlled diabetes mellitus ([place])   . Pulmonary nodules   . Anxiety attack   . GERD (gastroesophageal reflux disease)   . Osteoporosis   . TIA (transient ischemic attack) 05/18/2011  . Weakness 10/13/2010  . Essential hypertension, benign 05/05/2010  . Constipation 03/14/2010  . MITRAL VALVE DISORDER 02/19/2010  . Hyperlipidemia 01/11/2009   . Persistent atrial fibrillation ([place]) 06/22/2008    Past Surgical History:  Procedure Laterality Date  . BREAST LUMPECTOMY  1983   LEFT, benign  . Carotid US  2011   mild plaque formation  . CATARACT EXTRACTION  2012   RIGHT  . CESAREAN SECTION     (and h/o 6 miscarriages)  . CHOLECYSTECTOMY N/A 02/27/2013   Procedure: LAPAROSCOPIC CHOLECYSTECTOMY;  Surgeon: Donato Heinz, MD;  Location: [facility];  Service: General;  Laterality: N/A;  . COLONOSCOPY  03/2010   int hemorrhoids, diverticulosis, no need to repeat (Dr. Sydell Axon)  . DEXA  05/2010   T score -4.5 spine, -2.4 femur; does not want to repeat  . DEXA  04/2013   T score 3.9 [facility] spine, 2.6 hip  . DILATION AND CURETTAGE OF UTERUS     x2  . ESOPHAGOGASTRODUODENOSCOPY  10/2009   small HH, multiple antric ulcerations s/p biopsy, mild chronic gastritis/duodenitis  . ESOPHAGOGASTRODUODENOSCOPY  05/26/2012   RMR: small HH, o/w normal.   . IR GENERIC HISTORICAL  04/16/2016   IR RADIOLOGIST EVAL & MGMT 04/16/2016 [facility]  . IR GENERIC HISTORICAL  06/16/2016   IR VERTEBROPLASTY LUMBAR BX INC UNI/BIL INC/INJECT/IMAGING 06/16/2016 Luanne Bras, MD [facility]  . IR GENERIC HISTORICAL  06/16/2016   IR VERTEBROPLASTY EA ADDL (T&LS) BX INC UNI/BIL INC INJECT/IMAGING 06/16/2016 Luanne Bras, MD [facility]  . LAPAROSCOPIC CHOLECYSTECTOMY  02/2013   Dr. Geroge Baseman  . PATELLA FRACTURE SURGERY  05/2006   LEFT surgically repaired with pins and wire  . TONSILLECTOMY        Allergies Propoxyphene n-acetaminophen; Anoro ellipta [umeclidinium-vilanterol]; Codeine; Meperidine hcl; Morphine; and Shellfish allergy  Family History  Problem Relation Age of Onset  . Coronary artery disease Mother 23       MI deceased  . Colon cancer Father 80  . Asthma Father   . Asthma Sister   . Stroke Paternal Grandmother   . Diabetes Neg Hx     Social History Social History  Substance Use Topics  . Smoking status: Former Smoker     Packs/day: 1.00    Years: 30.00    Types: Cigarettes    Quit date: 10/13/1998  . Smokeless tobacco: Never Used  . Alcohol use No    Review of Systems  Constitutional: No fever/chills Eyes: No visual changes. ENT: No sore throat. Cardiovascular: Positive left chest pain. Respiratory: Positive shortness of breath. Gastrointestinal: No abdominal pain.  No nausea, no vomiting.  No diarrhea.  No constipation. Genitourinary: Negative for dysuria. Musculoskeletal: Negative for back pain. Skin: Negative for rash. Neurological: Negative for headaches, focal weakness or numbness.  10-point ROS otherwise negative.  ____________________________________________   PHYSICAL EXAM:  VITAL SIGNS: ED Triage Vitals  Enc Vitals Group     BP 03/01/2017 1336 92/62     Pulse Rate 02/19/2017 1336 95     Resp 03/06/2017 1336 (!) 24     Temp 03/07/2017 1336 (!) 97.4  F (36.3 C)     Temp Source 03/09/2017 1336 Oral     SpO2 03/01/2017 1336 (!) 87 %     Weight 03/04/2017 1335 125 lb (56.7 kg)     Height 03/12/2017 1335 5\' 2"  (1.575 m)     Pain Score 03/01/2017 1334 4   Constitutional: Alert and oriented. Appears uncomfortable at times.  Eyes: Conjunctivae are normal.  Head: Atraumatic. Nose: No congestion/rhinnorhea. Mouth/Throat: Mucous membranes are moist.   Neck: No stridor.  No cervical spine tenderness to palpation. Cardiovascular: Normal rate, regular rhythm. Good peripheral circulation. Grossly normal heart sounds.   Respiratory: Normal respiratory effort.  No retractions. Lung sounds equal bilaterally with trace expiratory wheezing.  Gastrointestinal: Soft and nontender. No distention.  Musculoskeletal: No lower extremity tenderness nor edema. No gross deformities of extremities. Normal Passive ROM of bilateral hips and knees. Some left chest wall pain with ROM of the left shoulder but no shoulder pain. Significant tenderness to palpation of the left lateral chest wall.  Neurologic:  Normal speech and  language. No gross focal neurologic deficits are appreciated.  Skin:  Skin is warm, dry and intact. No rash noted. ____________________________________________   LABS (all labs ordered are listed, but only abnormal results are displayed)  Labs Reviewed  BASIC METABOLIC PANEL - Abnormal; Notable for the following:       Result Value   Sodium 133 (*)    Glucose, Bld 147 (*)    All other components within normal limits  CBC WITH DIFFERENTIAL/PLATELET - Abnormal; Notable for the following:    WBC 11.0 (*)    RBC 3.69 (*)    MCV 100.8 (*)    Neutro Abs 8.3 (*)    Monocytes Absolute 1.2 (*)    All other components within normal limits  PROTIME-INR - Abnormal; Notable for the following:    Prothrombin Time 31.5 (*)    All other components within normal limits  BASIC METABOLIC PANEL - Abnormal; Notable for the following:    Calcium 8.2 (*)    All other components within normal limits  CBC - Abnormal; Notable for the following:    RBC 3.27 (*)    Hemoglobin 11.1 (*)    HCT 33.6 (*)    MCV 102.8 (*)    RDW 15.7 (*)    All other components within normal limits  PROTIME-INR - Abnormal; Notable for the following:    Prothrombin Time 38.8 (*)    All other components within normal limits  I-STAT TROPONIN, ED   ____________________________________________  EKG   EKG Interpretation  Date/Time:  Tuesday March 02 2017 14:06:38 EDT Ventricular Rate:  101 PR Interval:    QRS Duration: 97 QT Interval:  346 QTC Calculation: 442 R Axis:   76 Text Interpretation:  Atrial fibrillation Ventricular premature complex Low voltage, extremity and precordial leads Probable anterolateral infarct, old No STEMI. Similar to prior.  Confirmed by Nanda Quinton [phone]) on 02/19/2017 2:16:08 PM       ____________________________________________  RADIOLOGY  Dg Ribs Unilateral W/chest Left  Result Date: 02/18/2017 CLINICAL DATA:  Pain behind the left scapula after fall. Initial encounter. EXAM:  LEFT RIBS AND CHEST - 3+ VIEW COMPARISON:  Chest x-ray 10/13/2016 FINDINGS: Left lateral fifth rib fracture with 50% displacement. There is left pleuroparenchymal opacity that is chronic based on prior. No evidence of pneumothorax. Compression fractures of T8, T10, T11, T12, L1, L2, and L3. L2 and L3 have undergone vertebroplasty. The T11 fracture is new based on  09/14/2016 chest CT. The other fractures were also seen on the prior study. Dextrocurvature centered at L2. Cardiomegaly. IMPRESSION: 1. Displaced left fifth rib fracture.  No pneumothorax. 2. Small left pleural effusion and lower lobe atelectasis that is chronic based on priors. 3. Multiple lower thoracic and lumbar compression fractures, as above. A T11 fracture with moderate height loss has occurred since 09/14/2016 chest CT comparison. Electronically Signed   By: Monte Fantasia M.D.   On: 02/25/2017 15:40   Ct Head Wo Contrast  Result Date: 02/19/2017 CLINICAL DATA:  Head trauma, minor. There was fall 3 days ago from bed. Patient on anti coagulation. EXAM: CT HEAD WITHOUT CONTRAST TECHNIQUE: Contiguous axial images were obtained from the base of the skull through the vertex without intravenous contrast. COMPARISON:  06/06/2012. FINDINGS: Brain: No evidence of acute infarction, hemorrhage, hydrocephalus, extra-axial collection or mass lesion/mass effect. Mild for age generalized cerebral volume loss and periventricular white matter low-density. Vascular: Atherosclerotic calcification. Skull: Negative for fracture Sinuses/Orbits: No evidence of injury. IMPRESSION: Negative study.  No evidence of injury. Electronically Signed   By: Monte Fantasia M.D.   On: 03/16/2017 15:12    ____________________________________________   PROCEDURES  Procedure(s) performed:   Procedures  CRITICAL CARE Performed by: Margette Fast Total critical care time: 30 minutes Critical care time was exclusive of separately billable procedures and treating other  patients. Critical care was necessary to treat or prevent imminent or life-threatening deterioration. Critical care was time spent personally by me on the following activities: development of treatment plan with patient and/or surrogate as well as nursing, discussions with consultants, evaluation of patient's response to treatment, examination of patient, obtaining history from patient or surrogate, ordering and performing treatments and interventions, ordering and review of laboratory studies, ordering and review of radiographic studies, pulse oximetry and re-evaluation of patient's condition.  Nanda Quinton, MD Emergency Medicine  ____________________________________________   INITIAL IMPRESSION / ASSESSMENT AND PLAN / ED COURSE  Pertinent labs & imaging results that were available during my care of the patient were reviewed by me and considered in my medical decision making (see chart for details).  Patient presents to the emergency department for evaluation of chest wall pain in the setting of fall 3 days ago. She has increased oxygen requirement on arrival to the emergency department. Currently requiring 4 L by nasal cannula with desat to 80% with even mild exertion. Breath sounds clear to auscultation bilaterally. Low suspicion for pneumothorax but suspect that the patient has left sided rib fractures with possible atelectasis versus developing infiltrate. No evidence on exam to suggest severe COPD exacerbation. Plan to also obtain CT imaging of the head to r/o subacute SDH given the patient's anticoagulation status, but no focal deficits on exam. Giving fentanyl for pain.   03:50 PM On reevaluation the patient is in no acute distress but does have hypoxemia to his low as 79% on 4L [place]. Plan to discuss possibility of high flow nasal cannula with respiratory. No indication for BiPAP and ventilation appears appropriate. Continue to control pain and ordered incentive spirometry. Atelectasis read as  new. Possible new compression fracture on X-ray but no pain in that location on exam. Plan for admission for pain control and pulmonary toilet with new increased O2 requirement.   Discussed patient's case with Hospitalist, Dr. Lorin Mercy. Patient and family (if present) updated with plan. Care transferred to Hospitalist service.  I reviewed all nursing notes, vitals, pertinent old records, EKGs, labs, imaging (as available).  ____________________________________________  FINAL CLINICAL IMPRESSION(S) / ED DIAGNOSES  Final diagnoses:  Closed fracture of one rib of left side, initial encounter  Fall, initial encounter  Injury of head, initial encounter  Hypoxemia     MEDICATIONS GIVEN DURING THIS VISIT:  Medications  donepezil (ARICEPT) tablet 5 mg (5 mg Oral Given 03/17/2017 2117)  fluticasone furoate-vilanterol (BREO ELLIPTA) 100-25 MCG/INH 1 puff (not administered)  sertraline (ZOLOFT) tablet 100 mg (not administered)  LORazepam (ATIVAN) tablet 0.5 mg (not administered)  levothyroxine (SYNTHROID, LEVOTHROID) tablet 50 mcg (not administered)  felodipine (PLENDIL) 24 hr tablet 2.5 mg (not administered)  latanoprost (XALATAN) 0.005 % ophthalmic solution 1 drop (1 drop Both Eyes Not Given 02/17/2017 2200)  acetaminophen (TYLENOL) tablet 650 mg (not administered)    Or  acetaminophen (TYLENOL) suppository 650 mg (not administered)  docusate sodium (COLACE) capsule 100 mg (100 mg Oral Given 03/01/2017 2117)  ondansetron (ZOFRAN) tablet 4 mg (not administered)    Or  ondansetron (ZOFRAN) injection 4 mg (not administered)  HYDROmorphone (DILAUDID) injection 0.5 mg (0.5 mg Intravenous Given 03/03/17 0451)  Warfarin - Pharmacist Dosing Inpatient (not administered)  fentaNYL (SUBLIMAZE) injection 50 mcg (50 mcg Intravenous Given 03/01/2017 1424)  ondansetron (ZOFRAN) injection 4 mg (4 mg Intravenous Given 03/13/2017 1424)     NEW OUTPATIENT MEDICATIONS STARTED DURING THIS VISIT:  None   Note:  This  document was prepared using Dragon voice recognition software and may include unintentional dictation errors.  Nanda Quinton, MD Emergency Medicine    Long, Wonda [facility], MD 03/03/17 [phone]

## 2017-03-03 ENCOUNTER — Other Ambulatory Visit: Payer: Self-pay | Admitting: *Deleted

## 2017-03-03 ENCOUNTER — Other Ambulatory Visit: Payer: Self-pay | Admitting: Licensed Clinical Social Worker

## 2017-03-03 DIAGNOSIS — J9621 Acute and chronic respiratory failure with hypoxia: Secondary | ICD-10-CM | POA: Diagnosis not present

## 2017-03-03 DIAGNOSIS — S2232XA Fracture of one rib, left side, initial encounter for closed fracture: Secondary | ICD-10-CM

## 2017-03-03 DIAGNOSIS — Z7901 Long term (current) use of anticoagulants: Secondary | ICD-10-CM

## 2017-03-03 LAB — BASIC METABOLIC PANEL
Anion gap: 7 (ref 5–15)
BUN: 18 mg/dL (ref 6–20)
CHLORIDE: 106 mmol/L (ref 101–111)
CO2: 26 mmol/L (ref 22–32)
Calcium: 8.2 mg/dL — ABNORMAL LOW (ref 8.9–10.3)
Creatinine, Ser: 0.66 mg/dL (ref 0.44–1.00)
GFR calc non Af Amer: >60 mL/min (ref >60)
Glucose, Bld: 92 mg/dL (ref 65–99)
Potassium: 4 mmol/L (ref 3.5–5.1)
SODIUM: 139 mmol/L (ref 135–145)

## 2017-03-03 LAB — CBC
HEMATOCRIT: 33.6 % — AB (ref 36.0–46.0)
HEMOGLOBIN: 11.1 g/dL — AB (ref 12.0–15.0)
MCH: 33.9 pg (ref 26.0–34.0)
MCHC: 33 g/dL (ref 30.0–36.0)
MCV: 102.8 fL — AB (ref 78.0–100.0)
PLATELETS: 232 10*3/uL (ref 150–400)
RBC: 3.27 MIL/uL — AB (ref 3.87–5.11)
RDW: 15.7 % — ABNORMAL HIGH (ref 11.5–15.5)
WBC: 8.3 10*3/uL (ref 4.0–10.5)

## 2017-03-03 LAB — PROTIME-INR
INR: 3.85
Prothrombin Time: 38.8 seconds — ABNORMAL HIGH (ref 11.4–15.2)

## 2017-03-03 MED ORDER — PANTOPRAZOLE SODIUM 40 MG PO TBEC
40.0000 mg | DELAYED_RELEASE_TABLET | Freq: Every day | ORAL | Status: DC
  Administered 2017-03-03 – 2017-03-07 (×5): 40 mg via ORAL
  Filled 2017-03-03 (×5): qty 1

## 2017-03-03 MED ORDER — CALCIUM CARBONATE-VITAMIN D 500-200 MG-UNIT PO TABS
1.0000 | ORAL_TABLET | Freq: Two times a day (BID) | ORAL | Status: DC
  Administered 2017-03-03 – 2017-03-07 (×9): 1 via ORAL
  Filled 2017-03-03 (×14): qty 1

## 2017-03-03 MED ORDER — TRAMADOL HCL 50 MG PO TABS
50.0000 mg | ORAL_TABLET | Freq: Four times a day (QID) | ORAL | Status: DC | PRN
  Administered 2017-03-03 – 2017-03-07 (×8): 50 mg via ORAL
  Filled 2017-03-03 (×9): qty 1

## 2017-03-03 MED ORDER — KETOROLAC TROMETHAMINE 15 MG/ML IJ SOLN
15.0000 mg | Freq: Four times a day (QID) | INTRAMUSCULAR | Status: DC | PRN
  Administered 2017-03-03 – 2017-03-07 (×7): 15 mg via INTRAVENOUS
  Filled 2017-03-03 (×8): qty 1

## 2017-03-03 NOTE — Patient Outreach (Signed)
Assessment:  CSW spoke via phone with Greig Castilla, spouse of client. CSW verified identity of [name]. CSW and Kerry Dory spoke of client needs and status.  Calvin reported that client had fallen recently and was now a patient at [facility] in [place], [place]. Client is currently receiving care at [place] was not sure of discharge plan at present for client.   Client sees Dr. Danise Mina as primary care doctor in the community. Client has been attending scheduled client medical appointments. Client has support from her spouse and from her son. Client had been receiving home health physical therapy sessions as scheduled with [facility]  Client uses a rollator walker to assist her in ambulation.  Client  Previously said that her family members transport her to and from client's scheduled medical appointments. [name], a neighbor and friend of client, has been helping client maintain daily schedule for client, has been helping client with meals daily, and has helped client attend client appointments on schedule.  Client has been attending behavioral health appointments for client as scheduled. She previously said she thinks these counseling appointments are helpful to client.  Client is receiving [facility] nursing support with RN Joellyn Quails. Client has some memory challenges.  Client sometimes fatigues easily and has to take periodic rest breaks. She has some hearing loss.  CSW and client spoke previously of client care plan. CSW had  encouraged client previously  to participate in all scheduled client in home physical therapy sessions for client in next 30 days with [facility]  CSW also collaborated with RN Joellyn Quails on 03/03/17 regarding current status and needs of client. CSW thanked [place] for phone call with CSW on 03/03/17.    Plan:   Client to participate in all scheduled client in home physical therapy sessions for client in next 30 days with [facility]  CSW to collaborate with RN Joellyn Quails as needed in monitoring needs of client.  CSW to call client in 4 weeks to assess client needs at that time.  Norva Riffle.Kenslei Hearty MSW, LCSW Licensed Clinical Social Worker [facility] Care Management [phone]

## 2017-03-03 NOTE — Care Management Note (Addendum)
Case Management Note  Patient Details  Name: [name] MRN: [id number] Date of Birth: 1925-12-12  Subjective/Objective:                 Admitted with hypoxia and rib fx after fall at home. Lives with husband, 2 sons active in her care. Pt uses walker with ambulation, home oxygen on 2lpm at baseline. Home O2 through [place]. Currently on 10L. Also has hospital bed. [facility] with [facility]. [facility] rep, aware of admission. Husband reports having a lady who comes by to check in TID. Family aware pt in observation and medicare will not pay for STR, option of paying OOP discussed. Pt confused at this time, Pt's son Truman Hayward is her POA.   Action/Plan: PT eval pending. Anticipate pt will need HFNC at DC.   Expected Discharge Date:     03/04/2017             Expected Discharge Plan:  [place]  In-House Referral:  Clinical Social Work  Discharge planning Services  CM Consult  Post Acute Care Choice:  Home Health Choice offered to:  Patient  HH Arranged:  RN, PT, Social Work [employer] Agency:  [facility]  Status of Service:  In process, will continue to follow  Sherald Barge, RN 03/03/2017, 3:14 PM

## 2017-03-03 NOTE — Progress Notes (Signed)
PROGRESS NOTE    [name]  YSA:[id number] DOB: [birth year] DOA: 02/21/2017 PCP: Ria Bush, MD    Brief Narrative:   83 y.o. female with medical history significant of compression fractures s/p vertebroplasty; HLD; macular degeneration; hypothyroidism; glaucoma; GERD; HTN; DM; COPD on 2L home O2; CKD; afib on Coumadin; and diastolic heart failure presenting with SOB after a fall was found to have displaced left fifth rib fracture.  Assessment & Plan:   Principal Problem:   Acute on chronic respiratory failure with hypoxia (HCC) Active Problems:   Hypothyroidism   Compression fracture of vertebrae (HCC)   Chronic anticoagulation   Closed traumatic displaced fracture of one rib of left side  Hypoventilation 2/2 pain effect with narcotic sedative effect , will stop the dilaudid and wean off the o2. Start PT/OT , Apply brace , wean off o2, change pain control to tramadol and toradol combination ,discharge soon .   DVT prophylaxis: anticoagulated Code Status: DNR Family Communication: (son) Disposition Plan: (PT/OT EVAL   Consultants:   NONE   Procedures: NONE   Antimicrobials: (specify start and planned stop date. Auto populated tables are space occupying and do not give end dates) NONE    Subjective:  Little sedated , mildly confused but get oriented easily , breathing comfortably , no issues .   Objective: Vitals:   03/10/2017 2134 03/03/17 0114 03/03/17 0524 03/03/17 1207  BP:   124/81   Pulse:   86   Resp:   15   Temp:   (!) 97.5 F (36.4 C)   TempSrc:      SpO2: 94% 93% 92% 90%  Weight:      Height:       No intake or output data in the 24 hours ending 03/03/17 1240 Filed Weights   03/09/2017 1335  Weight: 56.7 kg (125 lb)    Examination:  General exam: Appears calm and comfortable  Respiratory system: Clear to auscultation. Breathing is shallow and air entry is limited . Cardiovascular system: S1 & S2 heard, RRR. No JVD, murmurs, rubs,  gallops or clicks. No pedal edema. Gastrointestinal system: Abdomen is nondistended, soft and nontender. No organomegaly or masses felt. Normal bowel sounds heard. Central nervous system: Alert and oriented. No focal neurological deficits. Extremities: Symmetric 5 x 5 power. Skin: No rashes, lesions or ulcers Psychiatry: Judgement and insight appear normal. Mood & affect appropriate.     Data Reviewed: I have personally reviewed following labs and imaging studies  CBC:  Recent Labs Lab 03/04/2017 1410 03/03/17 0622  WBC 11.0* 8.3  NEUTROABS 8.3*  --   HGB 12.4 11.1*  HCT 37.2 33.6*  MCV 100.8* 102.8*  PLT 250 323   Basic Metabolic Panel:  Recent Labs Lab 03/06/2017 1410 03/03/17 0622  NA 133* 139  K 4.0 4.0  CL 102 106  CO2 23 26  GLUCOSE 147* 92  BUN 20 18  CREATININE 0.73 0.66  CALCIUM 8.9 8.2*   GFR: Estimated Creatinine Clearance: 37.7 mL/min (by C-G formula based on SCr of 0.66 mg/dL). Liver Function Tests: No results for input(s): AST, ALT, ALKPHOS, BILITOT, PROT, ALBUMIN in the last 168 hours. No results for input(s): LIPASE, AMYLASE in the last 168 hours. No results for input(s): AMMONIA in the last 168 hours. Coagulation Profile:  Recent Labs Lab 03/06/2017 1410 03/03/17 0622  INR 2.97 3.85   Cardiac Enzymes: No results for input(s): CKTOTAL, CKMB, CKMBINDEX, TROPONINI in the last 168 hours. BNP (last 3 results)  Recent  Labs  01/28/17 1249  PROBNP 742.0*   HbA1C: No results for input(s): HGBA1C in the last 72 hours. CBG: No results for input(s): GLUCAP in the last 168 hours. Lipid Profile: No results for input(s): CHOL, HDL, LDLCALC, TRIG, CHOLHDL, LDLDIRECT in the last 72 hours. Thyroid Function Tests: No results for input(s): TSH, T4TOTAL, FREET4, T3FREE, THYROIDAB in the last 72 hours. Anemia Panel: No results for input(s): VITAMINB12, FOLATE, FERRITIN, TIBC, IRON, RETICCTPCT in the last 72 hours. Urine analysis:    Component Value  Date/Time   COLORURINE YELLOW 07/26/2016 2219   APPEARANCEUR HAZY (A) 07/26/2016 2219   LABSPEC 1.004 (L) 07/26/2016 2219   PHURINE 8.0 07/26/2016 2219   GLUCOSEU NEGATIVE 07/26/2016 2219   HGBUR NEGATIVE 07/26/2016 2219   BILIRUBINUR neg 11/03/2016 1324   KETONESUR NEGATIVE 07/26/2016 2219   PROTEINUR neg 11/03/2016 1324   PROTEINUR NEGATIVE 07/26/2016 2219   UROBILINOGEN negative (A) 11/03/2016 1324   UROBILINOGEN 0.2 12/25/2013 2250   NITRITE neg 11/03/2016 1324   NITRITE NEGATIVE 07/26/2016 2219   LEUKOCYTESUR Small (1+) (A) 11/03/2016 1324   Sepsis Labs: @LABRCNTIP (procalcitonin:4,lacticidven:4)  )No results found for this or any previous visit (from the past 240 hour(s)).       Radiology Studies: Dg Ribs Unilateral W/chest Left  Result Date: 03/07/2017 CLINICAL DATA:  Pain behind the left scapula after fall. Initial encounter. EXAM: LEFT RIBS AND CHEST - 3+ VIEW COMPARISON:  Chest x-ray 10/13/2016 FINDINGS: Left lateral fifth rib fracture with 50% displacement. There is left pleuroparenchymal opacity that is chronic based on prior. No evidence of pneumothorax. Compression fractures of T8, T10, T11, T12, L1, L2, and L3. L2 and L3 have undergone vertebroplasty. The T11 fracture is new based on 09/14/2016 chest CT. The other fractures were also seen on the prior study. Dextrocurvature centered at L2. Cardiomegaly. IMPRESSION: 1. Displaced left fifth rib fracture.  No pneumothorax. 2. Small left pleural effusion and lower lobe atelectasis that is chronic based on priors. 3. Multiple lower thoracic and lumbar compression fractures, as above. A T11 fracture with moderate height loss has occurred since 09/14/2016 chest CT comparison. Electronically Signed   By: Monte Fantasia M.D.   On: 02/19/2017 15:40   Ct Head Wo Contrast  Result Date: 02/27/2017 CLINICAL DATA:  Head trauma, minor. There was fall 3 days ago from bed. Patient on anti coagulation. EXAM: CT HEAD WITHOUT CONTRAST  TECHNIQUE: Contiguous axial images were obtained from the base of the skull through the vertex without intravenous contrast. COMPARISON:  06/06/2012. FINDINGS: Brain: No evidence of acute infarction, hemorrhage, hydrocephalus, extra-axial collection or mass lesion/mass effect. Mild for age generalized cerebral volume loss and periventricular white matter low-density. Vascular: Atherosclerotic calcification. Skull: Negative for fracture Sinuses/Orbits: No evidence of injury. IMPRESSION: Negative study.  No evidence of injury. Electronically Signed   By: Monte Fantasia M.D.   On: 03/07/2017 15:12        Scheduled Meds: . docusate sodium  100 mg Oral BID  . donepezil  5 mg Oral QHS  . felodipine  2.5 mg Oral Daily  . fluticasone furoate-vilanterol  1 puff Inhalation Daily  . latanoprost  1 drop Both Eyes QHS  . levothyroxine  50 mcg Oral QAC breakfast  . pantoprazole  40 mg Oral Daily  . sertraline  100 mg Oral Daily  . Warfarin - Pharmacist Dosing Inpatient   Does not apply Q24H   Continuous Infusions:   LOS: 0 days    Time spent: 35 m  Waldron Session, MD [facility] Pager [phone]  If 7PM-7AM, please contact night-coverage [email or web address] Password [email or web address] 03/03/2017, 12:40 PM

## 2017-03-03 NOTE — Patient Outreach (Signed)
[facility]) Care Management  03/03/2017  NAAVA JANEWAY 04-10-1926 [id number]   Collaboration with [facility] SW  [facility] CM reviewed ED notes when Mrs Kazanjian noted to arrive on 03/19/2017 after a noted fall  [facility] CM contacted by [facility] SW after a call to Mr Northrup this am   [facility] CM will continue to monitor EPIC notes for hospital stay  [facility] CM updated hospital CM that Mrs Hineman is followed by [place]. Lavina Hamman, RN, BSN, [place] Care Management [phone]

## 2017-03-03 NOTE — Care Management Obs Status (Signed)
[place] NOTIFICATION   Patient Details  Name: [name] MRN: [id number] Date of Birth: 1926/06/08   Medicare Observation Status Notification Given:  Yes    Sherald Barge, RN 03/03/2017, 10:49 AM

## 2017-03-03 NOTE — Progress Notes (Signed)
ANTICOAGULATION CONSULT NOTE - follow up  Pharmacy Consult for Warfarin Indication: atrial fibrillation  Allergies  Allergen Reactions  . Propoxyphene N-Acetaminophen Shortness Of Breath  . Anoro Ellipta [Umeclidinium-Vilanterol] Other (See Comments)    Blurry Vision   . Codeine Nausea And Vomiting  . Meperidine Hcl Nausea And Vomiting  . Morphine Nausea And Vomiting  . Shellfish Allergy Hives    Patient Measurements: Height: 5\' 2"  (157.5 cm) Weight: 125 lb (56.7 kg) IBW/kg (Calculated) : 50.1 Heparin Dosing Weight:   Vital Signs: Temp: 97.5 F (36.4 C) (08/15 0524) BP: 124/81 (08/15 0524) Pulse Rate: 86 (08/15 0524)  Labs:  Recent Labs  03/01/2017 1410 03/03/17 0622  HGB 12.4 11.1*  HCT 37.2 33.6*  PLT 250 232  LABPROT 31.5* 38.8*  INR 2.97 3.85  CREATININE 0.73 0.66    Estimated Creatinine Clearance: 37.7 mL/min (by C-G formula based on SCr of 0.66 mg/dL).   Medical History: Past Medical History:  Diagnosis Date  . Anemia   . Anxiety   . Aortic sclerosis   . Arthritis   . CHF (congestive heart failure) ([place])   . Chronic atrial fibrillation ([place])   . Chronic gastritis 10/2009   Duodenitis on EGD  . CKD (chronic kidney disease) stage 2, GFR 60-89 ml/min   . Collagen vascular disease ([place])   . COPD (chronic obstructive pulmonary disease) ([place])   . Depression   . Diabetes type 2, controlled ([place])    Diet controlled  . Diverticulosis   . Dry ARMD    Retinal hemorrhage (09/2013) [place]  . Essential hypertension, benign   . GERD (gastroesophageal reflux disease)   . Glaucoma    Dr. Venetia Maxon  . Hiatal hernia 2011   Small  . History of pelvic fracture April 2013  . History of rheumatic fever   . History of skin cancer   . History of TIA (transient ischemic attack)   . Hypothyroidism   . Macular degeneration   . Mixed hyperlipidemia   . Osteoporosis 04/2013   Thoracic compression fracture, on bisphosphonate and cal/vit D  . Oxygen deficiency   .  Pulmonary nodules    Stable bilateral on CT 01/2010, rec rpt yearly for 2 yrs, pt decided to stop f/u  . Seasonal allergies   . Vertebral compression fracture ([place]) 05/2016   Vertebroplasty L2/L3    Medications:  Prescriptions Prior to Admission  Medication Sig Dispense Refill Last Dose  . acetaminophen (TYLENOL) 500 MG tablet Take 2 tablets (1,000 mg total) by mouth 3 (three) times daily. (Patient taking differently: Take 1,000 mg by mouth every 8 (eight) hours as needed for mild pain. )   Taking  . Blood Glucose Monitoring Suppl (FREESTYLE LITE) DEVI Check blood sugar once daily and as directed. Dx E11.9 90 each 1 Taking  . Calcium-Magnesium-Vitamin D [phone] MG-MG-UNIT TABS Take 1 tablet by mouth 2 (two) times daily.   03/19/2017 at Unknown time  . donepezil (ARICEPT) 5 MG tablet TAKE 1 TABLET(5 MG) BY MOUTH AT BEDTIME 30 tablet 6 03/01/2017 at Unknown time  . felodipine (PLENDIL) 2.5 MG 24 hr tablet TAKE 1 TABLET DAILY 90 tablet 1 02/28/2017 at Unknown time  . ferrous sulfate 325 (65 FE) MG tablet Take 325 mg by mouth daily with breakfast.   03/09/2017 at Unknown time  . fluticasone furoate-vilanterol (BREO ELLIPTA) 100-25 MCG/INH AEPB Inhale 1 puff into the lungs daily. 1 each 6 03/01/2017 at Unknown time  . furosemide (LASIX) 40 MG tablet Take 80mg  in  the morning, 40mg  in afternoon 90 tablet 6 02/20/2017 at Unknown time  . glucose blood (FREESTYLE LITE) test strip Check blood sugar once daily and as directed. Dx E11.9 100 each 1 Taking  . Lancets (FREESTYLE) lancets Check blood sugar once daily and as directed. Dx E11.9 100 each 1 Taking  . latanoprost (XALATAN) 0.005 % ophthalmic solution Place 1 drop into both eyes at bedtime.    03/01/2017 at Unknown time  . lidocaine (LIDODERM) 5 % Place 1 patch onto the skin daily. Remove & Discard patch within 12 hours or as directed by MD 30 patch 6 03/06/2017 at Unknown time  . LORazepam (ATIVAN) 0.5 MG tablet Take 1 tablet (0.5 mg total) by mouth  every 8 (eight) hours as needed for anxiety. 60 tablet 0 03/04/2017 at Unknown time  . Misc Natural Products (FIBER 7) POWD Take by mouth daily.   Taking  . Multiple Vitamin (MULTIVITAMIN) capsule Take 1 capsule by mouth every morning.    02/27/2017 at Unknown time  . Omega-3 Fatty Acids (FISH OIL CONCENTRATE) 1000 MG CAPS Take 2 capsules by mouth 2 (two) times daily.     02/21/2017 at Unknown time  . potassium chloride SA (K-DUR,KLOR-CON) 20 MEQ tablet Take 1 tablet (20 mEq total) by mouth daily. 30 tablet 6 03/07/2017 at Unknown time  . sertraline (ZOLOFT) 100 MG tablet Take 1 tablet (100 mg total) by mouth daily. 90 tablet 1 03/07/2017 at Unknown time  . vitamin B-12 (CYANOCOBALAMIN) 500 MCG tablet Take 500 mcg by mouth daily.   03/06/2017 at Unknown time  . warfarin (COUMADIN) 2.5 MG tablet Take 1 tablet daily except 1 1/2 tablets on Mondays and Thursdays (Patient taking differently: Take 3.75 mg by mouth every evening. 2 tablets mon,tue, wed,fri,and 1/2 tablet on tue,thur, sat sun) 180 tablet 3 03/12/2017 at 800  . levothyroxine (SYNTHROID, LEVOTHROID) 50 MCG tablet TAKE 1 TABLET DAILY 90 tablet 3 Taking   Assessment: Okay for Protocol, INR @ goal.  Received dose today per home med list.  Recent fall will rib pain. INR today is elevated  Goal of Therapy:  INR 2-3   Plan:  No coumadin today Daily PT/INR Monitor for signs and symptoms of bleeding.   Isac Sarna, BS [place], BCPS Clinical Pharmacist Pager [phone] 03/03/2017,11:27 AM

## 2017-03-03 NOTE — Evaluation (Signed)
Physical Therapy Evaluation Patient Details Name: [name] MRN: [id number] DOB: Feb 20, 1926 Today's Date: 03/03/2017   History of Present Illness  [name] is a 83 y.o. female with medical history significant of compression fractures s/p vertebroplasty; HLD; macular degeneration; hypothyroidism; glaucoma; GERD; HTN; DM; COPD on 2L home O2; CKD; afib on Coumadin; and diastolic heart failure presenting with SOB after a fall.  Patient is on Coumadin and fell 3 days ago.  She was pretty sure she broke a rib (this has happened before).  She waited to "see what it did" but the pain got so bad that she had to come in.  Hurting underneath her left scapula.  Denies other back pain currently, although it does happen sometimes in a straight line just across her hips.  SOB for months, but worse with rib fracture (she thinks that might be related to anxiety).    Clinical Impression  [name] is an 83 yo female who was admitted into the hospital due to SOB, compression and rib fx.  She has been referred to skilled therapy for improved mobility.  Evaluation demonstrates significant limitation with transitional motions.  [name] will benefit from continued skilled therapy to increase her functional level to her previous level.     Follow Up Recommendations SNF    Equipment Recommendations  None recommended by PT    Recommendations for Other Services       Precautions / Restrictions Precautions Precautions: Fall Restrictions Weight Bearing Restrictions: No      Mobility  Bed Mobility Overal bed mobility: Needs Assistance Bed Mobility: Supine to Sit;Sit to Supine     Supine to sit: Max assist Sit to supine: Mod assist      Transfers Overall transfer level: Needs assistance Equipment used: Rolling walker (2 wheeled) Transfers: Sit to/from Stand Sit to Stand: Min assist            Ambulation/Gait Ambulation/Gait assistance: Min guard Ambulation Distance  (Feet): 56 Feet Assistive device: Rolling walker (2 wheeled) Gait Pattern/deviations: Decreased step length - right;Decreased step length - left   Gait velocity interpretation: <1.8 ft/sec, indicative of risk for recurrent falls    Stairs            Wheelchair Mobility    Modified Rankin (Stroke Patients Only)       Balance                                             Pertinent Vitals/Pain Pain Assessment: 0-10 Pain Score: 1  Pain Location: left rib area.  Pain level while at supine, increases siginificantly when moving    Home Living Family/patient expects to be discharged to:: Private residence Living Arrangements: Spouse/significant other Available Help at Discharge: Family Type of Home: House Home Access: Level entry     Home Layout: One level Home Equipment: Environmental consultant - 2 wheels      Prior Function Level of Independence: Independent               Hand Dominance        Extremity/Trunk Assessment   Upper Extremity Assessment Upper Extremity Assessment: Defer to OT evaluation    Lower Extremity Assessment Lower Extremity Assessment: Overall WFL for tasks assessed       Communication   Communication: No difficulties  Cognition Arousal/Alertness: (P) Awake/alert   Overall Cognitive Status: (P) Within  Functional Limits for tasks assessed                                        General Comments      Exercises General Exercises - Lower Extremity Ankle Circles/Pumps: AROM;Both;10 reps Quad Sets: AROM;Both;10 reps Gluteal Sets: AROM;Both;10 reps Heel Slides: AROM;Both;5 reps   Assessment/Plan    PT Assessment Patient needs continued PT services  PT Problem List Decreased strength;Decreased activity tolerance;Pain       PT Treatment Interventions Gait training;Functional mobility training;Therapeutic exercise    PT Goals (Current goals can be found in the Care Plan section)  Acute Rehab PT Goals PT  Goal Formulation: With patient Potential to Achieve Goals: Good    Frequency Min 5X/week   Barriers to discharge        Co-evaluation               AM-PAC PT "6 Clicks" Daily Activity  Outcome Measure Difficulty turning over in bed (including adjusting bedclothes, sheets and blankets)?: Total Difficulty moving from lying on back to sitting on the side of the bed? : Total Difficulty sitting down on and standing up from a chair with arms (e.g., wheelchair, bedside commode, etc,.)?: A Little Help needed moving to and from a bed to chair (including a wheelchair)?: A Little Help needed walking in hospital room?: A Little Help needed climbing 3-5 steps with a railing? : Total 6 Click Score: 12    End of Session Equipment Utilized During Treatment: Gait belt Activity Tolerance: Patient limited by pain Patient left: in bed;with call bell/phone within reach;with bed alarm set Nurse Communication: Mobility status PT Visit Diagnosis: Unsteadiness on feet (R26.81);Muscle weakness (generalized) (M62.81);Difficulty in walking, not elsewhere classified (R26.2);History of falling (Z91.81)    Time: 1445-1535 PT Time Calculation (min) (ACUTE ONLY): 50 min   Charges:   PT Evaluation $PT Eval Moderate Complexity: 1 Mod PT Treatments $Therapeutic Exercise: 8-22 mins   PT G Codes:   PT G-Codes **NOT FOR INPATIENT CLASS** Functional Assessment Tool Used: AM-PAC 6 Clicks Basic Mobility Functional Limitation: Changing and maintaining body position Changing and Maintaining Body Position Current Status (M5465): At least 60 percent but less than 80 percent impaired, limited or restricted Changing and Maintaining Body Position Goal Status (K3546): At least 60 percent but less than 80 percent impaired, limited or restricted Changing and Maintaining Body Position Discharge Status [phone]): At least 60 percent but less than 80 percent impaired, limited or restricted     Rayetta Humphrey, PT  CLT [phone] 03/03/2017, 3:36 PM

## 2017-03-04 ENCOUNTER — Observation Stay (HOSPITAL_COMMUNITY): Payer: Medicare Other

## 2017-03-04 DIAGNOSIS — I509 Heart failure, unspecified: Secondary | ICD-10-CM | POA: Diagnosis not present

## 2017-03-04 DIAGNOSIS — J449 Chronic obstructive pulmonary disease, unspecified: Secondary | ICD-10-CM | POA: Diagnosis present

## 2017-03-04 DIAGNOSIS — R0602 Shortness of breath: Secondary | ICD-10-CM | POA: Diagnosis not present

## 2017-03-04 DIAGNOSIS — Y95 Nosocomial condition: Secondary | ICD-10-CM | POA: Diagnosis present

## 2017-03-04 DIAGNOSIS — S22089A Unspecified fracture of T11-T12 vertebra, initial encounter for closed fracture: Secondary | ICD-10-CM | POA: Diagnosis present

## 2017-03-04 DIAGNOSIS — Z7189 Other specified counseling: Secondary | ICD-10-CM | POA: Diagnosis not present

## 2017-03-04 DIAGNOSIS — S32039A Unspecified fracture of third lumbar vertebra, initial encounter for closed fracture: Secondary | ICD-10-CM | POA: Diagnosis present

## 2017-03-04 DIAGNOSIS — K219 Gastro-esophageal reflux disease without esophagitis: Secondary | ICD-10-CM | POA: Diagnosis present

## 2017-03-04 DIAGNOSIS — M4850XG Collapsed vertebra, not elsewhere classified, site unspecified, subsequent encounter for fracture with delayed healing: Secondary | ICD-10-CM

## 2017-03-04 DIAGNOSIS — Z515 Encounter for palliative care: Secondary | ICD-10-CM | POA: Diagnosis not present

## 2017-03-04 DIAGNOSIS — I5032 Chronic diastolic (congestive) heart failure: Secondary | ICD-10-CM | POA: Diagnosis present

## 2017-03-04 DIAGNOSIS — S22069A Unspecified fracture of T7-T8 vertebra, initial encounter for closed fracture: Secondary | ICD-10-CM | POA: Diagnosis present

## 2017-03-04 DIAGNOSIS — E1122 Type 2 diabetes mellitus with diabetic chronic kidney disease: Secondary | ICD-10-CM | POA: Diagnosis present

## 2017-03-04 DIAGNOSIS — I130 Hypertensive heart and chronic kidney disease with heart failure and stage 1 through stage 4 chronic kidney disease, or unspecified chronic kidney disease: Secondary | ICD-10-CM | POA: Diagnosis present

## 2017-03-04 DIAGNOSIS — S2239XA Fracture of one rib, unspecified side, initial encounter for closed fracture: Secondary | ICD-10-CM | POA: Diagnosis not present

## 2017-03-04 DIAGNOSIS — J189 Pneumonia, unspecified organism: Secondary | ICD-10-CM | POA: Diagnosis not present

## 2017-03-04 DIAGNOSIS — E039 Hypothyroidism, unspecified: Secondary | ICD-10-CM | POA: Diagnosis present

## 2017-03-04 DIAGNOSIS — S32029A Unspecified fracture of second lumbar vertebra, initial encounter for closed fracture: Secondary | ICD-10-CM | POA: Diagnosis present

## 2017-03-04 DIAGNOSIS — J9621 Acute and chronic respiratory failure with hypoxia: Secondary | ICD-10-CM | POA: Diagnosis not present

## 2017-03-04 DIAGNOSIS — S2242XA Multiple fractures of ribs, left side, initial encounter for closed fracture: Secondary | ICD-10-CM | POA: Diagnosis present

## 2017-03-04 DIAGNOSIS — J9819 Other pulmonary collapse: Secondary | ICD-10-CM | POA: Diagnosis present

## 2017-03-04 DIAGNOSIS — N182 Chronic kidney disease, stage 2 (mild): Secondary | ICD-10-CM | POA: Diagnosis present

## 2017-03-04 DIAGNOSIS — S2232XA Fracture of one rib, left side, initial encounter for closed fracture: Secondary | ICD-10-CM | POA: Diagnosis not present

## 2017-03-04 DIAGNOSIS — J690 Pneumonitis due to inhalation of food and vomit: Secondary | ICD-10-CM | POA: Diagnosis present

## 2017-03-04 DIAGNOSIS — Z9981 Dependence on supplemental oxygen: Secondary | ICD-10-CM | POA: Diagnosis not present

## 2017-03-04 DIAGNOSIS — W06XXXA Fall from bed, initial encounter: Secondary | ICD-10-CM | POA: Diagnosis present

## 2017-03-04 DIAGNOSIS — I482 Chronic atrial fibrillation: Secondary | ICD-10-CM | POA: Diagnosis present

## 2017-03-04 DIAGNOSIS — Z66 Do not resuscitate: Secondary | ICD-10-CM | POA: Diagnosis present

## 2017-03-04 DIAGNOSIS — E782 Mixed hyperlipidemia: Secondary | ICD-10-CM | POA: Diagnosis present

## 2017-03-04 DIAGNOSIS — H3530 Unspecified macular degeneration: Secondary | ICD-10-CM | POA: Diagnosis present

## 2017-03-04 DIAGNOSIS — S32019A Unspecified fracture of first lumbar vertebra, initial encounter for closed fracture: Secondary | ICD-10-CM | POA: Diagnosis present

## 2017-03-04 DIAGNOSIS — Y92009 Unspecified place in unspecified non-institutional (private) residence as the place of occurrence of the external cause: Secondary | ICD-10-CM | POA: Diagnosis not present

## 2017-03-04 DIAGNOSIS — Z7901 Long term (current) use of anticoagulants: Secondary | ICD-10-CM | POA: Diagnosis not present

## 2017-03-04 DIAGNOSIS — S22079A Unspecified fracture of T9-T10 vertebra, initial encounter for closed fracture: Secondary | ICD-10-CM | POA: Diagnosis present

## 2017-03-04 DIAGNOSIS — H409 Unspecified glaucoma: Secondary | ICD-10-CM | POA: Diagnosis present

## 2017-03-04 LAB — PROTIME-INR
INR: 3.82
Prothrombin Time: 38.6 seconds — ABNORMAL HIGH (ref 11.4–15.2)

## 2017-03-04 MED ORDER — VANCOMYCIN HCL IN DEXTROSE 1-5 GM/200ML-% IV SOLN
1000.0000 mg | Freq: Once | INTRAVENOUS | Status: AC
  Administered 2017-03-04: 1000 mg via INTRAVENOUS
  Filled 2017-03-04: qty 200

## 2017-03-04 MED ORDER — VANCOMYCIN HCL IN DEXTROSE 750-5 MG/150ML-% IV SOLN
750.0000 mg | INTRAVENOUS | Status: DC
  Administered 2017-03-05: 750 mg via INTRAVENOUS
  Filled 2017-03-04 (×3): qty 150

## 2017-03-04 MED ORDER — VANCOMYCIN HCL IN DEXTROSE 1-5 GM/200ML-% IV SOLN: 1000.0000 mg | INTRAVENOUS | Status: DC

## 2017-03-04 MED ORDER — PIPERACILLIN-TAZOBACTAM 3.375 G IVPB
3.3750 g | Freq: Three times a day (TID) | INTRAVENOUS | Status: DC
  Administered 2017-03-04 – 2017-03-06 (×6): 3.375 g via INTRAVENOUS
  Filled 2017-03-04 (×5): qty 50

## 2017-03-04 NOTE — Progress Notes (Signed)
Pharmacy Antibiotic Note  [name] is a 83 y.o. female admitted on 03/12/2017 with pneumonia.  Pharmacy has been consulted for Vancomycin and zosyn dosing.  Plan: Vancomycin 1000mg  loading dose, then 750mg   IV every 24 hours.  Goal trough 15-20 mcg/mL. Zosyn 3.375g IV q8h (4 hour infusion).  F/U cxs and clinical progress Monitor V/S, labs, and levels as indicated  Height: 5\' 2"  (157.5 cm) Weight: 125 lb (56.7 kg) IBW/kg (Calculated) : 50.1  Temp (24hrs), Avg:98.5 F (36.9 C), Min:98.4 F (36.9 C), Max:98.6 F (37 C)   Recent Labs Lab 03/03/2017 1410 03/03/17 0622  WBC 11.0* 8.3  CREATININE 0.73 0.66    Estimated Creatinine Clearance: 37.7 mL/min (by C-G formula based on SCr of 0.66 mg/dL).    Allergies  Allergen Reactions  . Propoxyphene N-Acetaminophen Shortness Of Breath  . Anoro Ellipta [Umeclidinium-Vilanterol] Other (See Comments)    Blurry Vision   . Codeine Nausea And Vomiting  . Meperidine Hcl Nausea And Vomiting  . Morphine Nausea And Vomiting  . Shellfish Allergy Hives    Antimicrobials this admission: Vancomycin 8/16 >>  Zosyn 8/16 >>   Thank you for allowing pharmacy to be a part of this patient's care.  Isac Sarna, BS Pharm D, [place] Clinical Pharmacist Pager [phone] 03/04/2017 3:10 PM

## 2017-03-04 NOTE — Clinical Social Work Note (Signed)
Clinical Social Work Assessment  Patient Details  Name: [name] MRN: [id number] Date of Birth: 07/13/1926  Date of referral:  03/04/17               Reason for consult:  Discharge Planning                Permission sought to share information with:    Permission granted to share information::     Name::        Agency::     Relationship::     Contact Information:  step son, Kalanie Fewell, listed on chart.   Housing/Transportation Living arrangements for the past 2 months:  [place] of Information:  Patient, Adult Children Patient Interpreter Needed:  None Criminal Activity/Legal Involvement Pertinent to Current Situation/Hospitalization:  No - Comment as needed Significant Relationships:  Adult Children, Spouse Lives with:  Spouse Do you feel safe going back to the place where you live?  Yes Need for family participation in patient care:  No (Coment)  Care giving concerns: Patient is active with [facility] three times per week currently.    Social Worker assessment / plan:  Per patient, she lives with her husband. Patient states that she "uses a little push cart" to assist her with ambulation.  Patient is independent in her ADLs with some assistance from her husband with meal preparation. Patient advised that she wanted LCSW to speak with her family about discharge plan after some discussion about it. Vernelle Emerald, step-son, and LCSW discussed discharge plan and patient's observation status.  Mr. Willingham stated that he would speak to his father about the situation as his father was [facility] and would have a difficult time engaging with LCSW via phone due to being [facility].  LCSW advised that patient may discharge today.  Mr. Mumpower contacted LCSW and advised that they desired to take patient home with HHPT.  CM aware LCSW signing off.   Employment status:  Part-Time Forensic scientist:  Medicare PT Recommendations:  [place] / Referral to community resources:  [place]  Patient/Family's Response to care:  Family desires to take patient home with HHPT.  Patient/Family's Understanding of and Emotional Response to Diagnosis, Current Treatment, and Prognosis:  Family understands patient's diagnosis, treatment and prognosis.   Emotional Assessment Appearance:  Appears stated age Attitude/Demeanor/Rapport:    Affect (typically observed):  Accepting, Calm Orientation:  Oriented to Situation, Oriented to  Time, Oriented to Place, Oriented to Self (Patient report the date was March 05, 1799. She was attempting to arrange 2018 visually, however was unable to.) Alcohol / Substance use:  Not Applicable Psych involvement (Current and /or in the community):  No (Comment)  Discharge Needs  Concerns to be addressed:  Discharge Planning Concerns Readmission within the last 30 days:  No Current discharge risk:  None Barriers to Discharge:  Other (Patient does not have the three night stay required by Medicare. )   Ihor Gully, LCSW 03/04/2017, 12:23 PM

## 2017-03-04 NOTE — Progress Notes (Signed)
ANTICOAGULATION CONSULT NOTE - follow up  Pharmacy Consult for Warfarin Indication: atrial fibrillation  Allergies  Allergen Reactions  . Propoxyphene N-Acetaminophen Shortness Of Breath  . Anoro Ellipta [Umeclidinium-Vilanterol] Other (See Comments)    Blurry Vision   . Codeine Nausea And Vomiting  . Meperidine Hcl Nausea And Vomiting  . Morphine Nausea And Vomiting  . Shellfish Allergy Hives    Patient Measurements: Height: 5\' 2"  (157.5 cm) Weight: 125 lb (56.7 kg) IBW/kg (Calculated) : 50.1  Vital Signs: Temp: 98.5 F (36.9 C) (08/16 0446) Temp Source: Oral (08/16 0446) BP: 109/57 (08/16 0446) Pulse Rate: 63 (08/16 0446)  Labs:  Recent Labs  02/26/2017 1410 03/03/17 0622 03/04/17 0529  HGB 12.4 11.1*  --   HCT 37.2 33.6*  --   PLT 250 232  --   LABPROT 31.5* 38.8* 38.6*  INR 2.97 3.85 3.82  CREATININE 0.73 0.66  --     Estimated Creatinine Clearance: 37.7 mL/min (by C-G formula based on SCr of 0.66 mg/dL).   Medical History: Past Medical History:  Diagnosis Date  . Anemia   . Anxiety   . Aortic sclerosis   . Arthritis   . CHF (congestive heart failure) ([place])   . Chronic atrial fibrillation ([place])   . Chronic gastritis 10/2009   Duodenitis on EGD  . CKD (chronic kidney disease) stage 2, GFR 60-89 ml/min   . Collagen vascular disease ([place])   . COPD (chronic obstructive pulmonary disease) ([place])   . Depression   . Diabetes type 2, controlled ([place])    Diet controlled  . Diverticulosis   . Dry ARMD    Retinal hemorrhage (09/2013) [place]  . Essential hypertension, benign   . GERD (gastroesophageal reflux disease)   . Glaucoma    Dr. Venetia Maxon  . Hiatal hernia 2011   Small  . History of pelvic fracture April 2013  . History of rheumatic fever   . History of skin cancer   . History of TIA (transient ischemic attack)   . Hypothyroidism   . Macular degeneration   . Mixed hyperlipidemia   . Osteoporosis 04/2013   Thoracic compression fracture, on  bisphosphonate and cal/vit D  . Oxygen deficiency   . Pulmonary nodules    Stable bilateral on CT 01/2010, rec rpt yearly for 2 yrs, pt decided to stop f/u  . Seasonal allergies   . Vertebral compression fracture ([place]) 05/2016   Vertebroplasty L2/L3    Medications:  Prescriptions Prior to Admission  Medication Sig Dispense Refill Last Dose  . acetaminophen (TYLENOL) 500 MG tablet Take 2 tablets (1,000 mg total) by mouth 3 (three) times daily. (Patient taking differently: Take 1,000 mg by mouth every 8 (eight) hours as needed for mild pain. )   Taking  . Blood Glucose Monitoring Suppl (FREESTYLE LITE) DEVI Check blood sugar once daily and as directed. Dx E11.9 90 each 1 Taking  . Calcium-Magnesium-Vitamin D [phone] MG-MG-UNIT TABS Take 1 tablet by mouth 2 (two) times daily.   02/18/2017 at Unknown time  . donepezil (ARICEPT) 5 MG tablet TAKE 1 TABLET(5 MG) BY MOUTH AT BEDTIME 30 tablet 6 03/01/2017 at Unknown time  . felodipine (PLENDIL) 2.5 MG 24 hr tablet TAKE 1 TABLET DAILY 90 tablet 1 03/19/2017 at Unknown time  . ferrous sulfate 325 (65 FE) MG tablet Take 325 mg by mouth daily with breakfast.   03/07/2017 at Unknown time  . fluticasone furoate-vilanterol (BREO ELLIPTA) 100-25 MCG/INH AEPB Inhale 1 puff into the lungs daily.  1 each 6 03/01/2017 at Unknown time  . furosemide (LASIX) 40 MG tablet Take 80mg  in the morning, 40mg  in afternoon 90 tablet 6 03/01/2017 at Unknown time  . glucose blood (FREESTYLE LITE) test strip Check blood sugar once daily and as directed. Dx E11.9 100 each 1 Taking  . Lancets (FREESTYLE) lancets Check blood sugar once daily and as directed. Dx E11.9 100 each 1 Taking  . latanoprost (XALATAN) 0.005 % ophthalmic solution Place 1 drop into both eyes at bedtime.    03/01/2017 at Unknown time  . lidocaine (LIDODERM) 5 % Place 1 patch onto the skin daily. Remove & Discard patch within 12 hours or as directed by MD 30 patch 6 02/21/2017 at Unknown time  . LORazepam (ATIVAN)  0.5 MG tablet Take 1 tablet (0.5 mg total) by mouth every 8 (eight) hours as needed for anxiety. 60 tablet 0 03/01/2017 at Unknown time  . Misc Natural Products (FIBER 7) POWD Take by mouth daily.   Taking  . Multiple Vitamin (MULTIVITAMIN) capsule Take 1 capsule by mouth every morning.    03/14/2017 at Unknown time  . Omega-3 Fatty Acids (FISH OIL CONCENTRATE) 1000 MG CAPS Take 2 capsules by mouth 2 (two) times daily.     02/17/2017 at Unknown time  . potassium chloride SA (K-DUR,KLOR-CON) 20 MEQ tablet Take 1 tablet (20 mEq total) by mouth daily. 30 tablet 6 02/22/2017 at Unknown time  . sertraline (ZOLOFT) 100 MG tablet Take 1 tablet (100 mg total) by mouth daily. 90 tablet 1 02/25/2017 at Unknown time  . vitamin B-12 (CYANOCOBALAMIN) 500 MCG tablet Take 500 mcg by mouth daily.   03/01/2017 at Unknown time  . warfarin (COUMADIN) 2.5 MG tablet Take 1 tablet daily except 1 1/2 tablets on Mondays and Thursdays (Patient taking differently: Take 3.75 mg by mouth every evening. 2 tablets mon,tue, wed,fri,and 1/2 tablet on tue,thur, sat sun) 180 tablet 3 03/01/2017 at 800  . levothyroxine (SYNTHROID, LEVOTHROID) 50 MCG tablet TAKE 1 TABLET DAILY 90 tablet 3 Taking   Assessment: Okay for Protocol, INR @ goal.  Received dose today per home med list.  Recent fall will rib pain. INR today is elevated  Goal of Therapy:  INR 2-3   Plan:  No coumadin today Daily PT/INR Monitor for signs and symptoms of bleeding.   Isac Sarna, BS [place]ena [place], BCPS Clinical Pharmacist Pager [phone] 03/04/2017,12:22 PM

## 2017-03-04 NOTE — Progress Notes (Signed)
please do not titrate patients O2 unless checking her SPO2 at that time. Patient found to have SPO2 in the high 60s on 2lpm [place]. I then placed her on 11lpm HFNC which is what was reported to me that she was currently wearing. Her spo2 has gone up to 93% and sustaining. I will titrate to 9lpm.

## 2017-03-04 NOTE — Progress Notes (Signed)
PROGRESS NOTE    [name]  IZT:[id number] DOB: Aug 28, 1925 DOA: 03/11/2017 PCP: Ria Bush, MD    Brief Narrative:   83 y.o. female with medical history significant of compression fractures s/p vertebroplasty; HLD; macular degeneration; hypothyroidism; glaucoma; GERD; HTN; DM; COPD on 2L home O2; CKD; afib on Coumadin; and diastolic heart failure presenting with SOB after a fall was found to have displaced left fifth rib fracture.  Assessment & Plan:     HCAP (healthcare-associated pneumonia)   Acute on chronic respiratory failure with hypoxia (HCC)   Hypothyroidism   Compression fracture of vertebrae (HCC)   Chronic anticoagulation   Closed traumatic displaced fracture of one rib of left side      Start IV zosyn , send to the ICU , consult Pulmonary .  DVT prophylaxis: anticoagulated Code Status: DNR Family Communication: (son) Disposition Plan: (PT/OT EVAL   Consultants:   NONE   Procedures: NONE   Antimicrobials: (specify start and planned stop date. Auto populated tables are space occupying and do not give end dates) NONE    Subjective:  Sever SOB with pleuritic chest pain , went hypoxic again overnight .   Objective: Vitals:   03/04/17 0446 03/04/17 0818 03/04/17 0826 03/04/17 1030  BP: (!) 109/57     Pulse: 63     Resp:      Temp: 98.5 F (36.9 C)     TempSrc: Oral     SpO2: 90% (!) 70% 90% 90%  Weight:      Height:        Intake/Output Summary (Last 24 hours) at 03/04/17 1348 Last data filed at 03/04/17 1300  Gross per 24 hour  Intake              480 ml  Output                0 ml  Net              480 ml   Filed Weights   02/26/2017 1335  Weight: 56.7 kg (125 lb)    Examination:  General exam: Appears calm and comfortable  Respiratory system: Clear to auscultation. Breathing is shallow and air entry is limited . Cardiovascular system: S1 & S2 heard, RRR. No JVD, murmurs, rubs, gallops or clicks. No pedal  edema. Gastrointestinal system: Abdomen is nondistended, soft and nontender. No organomegaly or masses felt. Normal bowel sounds heard. Central nervous system: Alert and oriented. No focal neurological deficits. Extremities: Symmetric 5 x 5 power. Skin: No rashes, lesions or ulcers Psychiatry: Judgement and insight appear normal. Mood & affect appropriate.     Data Reviewed: I have personally reviewed following labs and imaging studies  CBC:  Recent Labs Lab 02/25/2017 1410 03/03/17 0622  WBC 11.0* 8.3  NEUTROABS 8.3*  --   HGB 12.4 11.1*  HCT 37.2 33.6*  MCV 100.8* 102.8*  PLT 250 382   Basic Metabolic Panel:  Recent Labs Lab 03/04/2017 1410 03/03/17 0622  NA 133* 139  K 4.0 4.0  CL 102 106  CO2 23 26  GLUCOSE 147* 92  BUN 20 18  CREATININE 0.73 0.66  CALCIUM 8.9 8.2*   GFR: Estimated Creatinine Clearance: 37.7 mL/min (by C-G formula based on SCr of 0.66 mg/dL). Liver Function Tests: No results for input(s): AST, ALT, ALKPHOS, BILITOT, PROT, ALBUMIN in the last 168 hours. No results for input(s): LIPASE, AMYLASE in the last 168 hours. No results for input(s): AMMONIA in the last 168  hours. Coagulation Profile:  Recent Labs Lab 03/04/2017 1410 03/03/17 0622 03/04/17 0529  INR 2.97 3.85 3.82   Cardiac Enzymes: No results for input(s): CKTOTAL, CKMB, CKMBINDEX, TROPONINI in the last 168 hours. BNP (last 3 results)  Recent Labs  01/28/17 1249  PROBNP 742.0*   HbA1C: No results for input(s): HGBA1C in the last 72 hours. CBG: No results for input(s): GLUCAP in the last 168 hours. Lipid Profile: No results for input(s): CHOL, HDL, LDLCALC, TRIG, CHOLHDL, LDLDIRECT in the last 72 hours. Thyroid Function Tests: No results for input(s): TSH, T4TOTAL, FREET4, T3FREE, THYROIDAB in the last 72 hours. Anemia Panel: No results for input(s): VITAMINB12, FOLATE, FERRITIN, TIBC, IRON, RETICCTPCT in the last 72 hours. Urine analysis:    Component Value Date/Time    COLORURINE YELLOW 07/26/2016 2219   APPEARANCEUR HAZY (A) 07/26/2016 2219   LABSPEC 1.004 (L) 07/26/2016 2219   PHURINE 8.0 07/26/2016 2219   GLUCOSEU NEGATIVE 07/26/2016 2219   HGBUR NEGATIVE 07/26/2016 2219   BILIRUBINUR neg 11/03/2016 1324   KETONESUR NEGATIVE 07/26/2016 2219   PROTEINUR neg 11/03/2016 1324   PROTEINUR NEGATIVE 07/26/2016 2219   UROBILINOGEN negative (A) 11/03/2016 1324   UROBILINOGEN 0.2 12/25/2013 2250   NITRITE neg 11/03/2016 1324   NITRITE NEGATIVE 07/26/2016 2219   LEUKOCYTESUR Small (1+) (A) 11/03/2016 1324   Sepsis Labs: @LABRCNTIP (procalcitonin:4,lacticidven:4)  )No results found for this or any previous visit (from the past 240 hour(s)).       Radiology Studies: Dg Chest 1 View  Result Date: 03/04/2017 CLINICAL DATA:  Shortness of Breath EXAM: CHEST 1 VIEW COMPARISON:  March 02, 2017 FINDINGS: There is extensive airspace consolidation throughout the left mid lower lung zones with left pleural effusion. There is a small right pleural effusion with slight atelectasis in the right base. There is cardiomegaly with pulmonary venous hypertension. No adenopathy. Aorta is tortuous with atherosclerotic calcification in the aorta. No evident adenopathy. Recently noted left fifth rib fracture is not appreciable on this examination. No pneumothorax. IMPRESSION: Airspace consolidation, likely pneumonia, throughout the left mid and lower lung zones. There is a moderate left effusion with a rather minimal right pleural effusion. There is pulmonary vascular congestion. Cardiomegaly is stable. Aorta is somewhat tortuous with aortic atherosclerosis. Aortic Atherosclerosis (ICD10-I70.0). Electronically Signed   By: Lowella Grip III M.D.   On: 03/04/2017 12:45   Dg Ribs Unilateral W/chest Left  Result Date: 02/28/2017 CLINICAL DATA:  Pain behind the left scapula after fall. Initial encounter. EXAM: LEFT RIBS AND CHEST - 3+ VIEW COMPARISON:  Chest x-ray 10/13/2016  FINDINGS: Left lateral fifth rib fracture with 50% displacement. There is left pleuroparenchymal opacity that is chronic based on prior. No evidence of pneumothorax. Compression fractures of T8, T10, T11, T12, L1, L2, and L3. L2 and L3 have undergone vertebroplasty. The T11 fracture is new based on 09/14/2016 chest CT. The other fractures were also seen on the prior study. Dextrocurvature centered at L2. Cardiomegaly. IMPRESSION: 1. Displaced left fifth rib fracture.  No pneumothorax. 2. Small left pleural effusion and lower lobe atelectasis that is chronic based on priors. 3. Multiple lower thoracic and lumbar compression fractures, as above. A T11 fracture with moderate height loss has occurred since 09/14/2016 chest CT comparison. Electronically Signed   By: Monte Fantasia M.D.   On: 03/01/2017 15:40   Ct Head Wo Contrast  Result Date: 02/22/2017 CLINICAL DATA:  Head trauma, minor. There was fall 3 days ago from bed. Patient on anti coagulation. EXAM: CT HEAD  WITHOUT CONTRAST TECHNIQUE: Contiguous axial images were obtained from the base of the skull through the vertex without intravenous contrast. COMPARISON:  06/06/2012. FINDINGS: Brain: No evidence of acute infarction, hemorrhage, hydrocephalus, extra-axial collection or mass lesion/mass effect. Mild for age generalized cerebral volume loss and periventricular white matter low-density. Vascular: Atherosclerotic calcification. Skull: Negative for fracture Sinuses/Orbits: No evidence of injury. IMPRESSION: Negative study.  No evidence of injury. Electronically Signed   By: Monte Fantasia M.D.   On: 03/15/2017 15:12        Scheduled Meds: . calcium-vitamin D  1 tablet Oral BID  . docusate sodium  100 mg Oral BID  . donepezil  5 mg Oral QHS  . felodipine  2.5 mg Oral Daily  . fluticasone furoate-vilanterol  1 puff Inhalation Daily  . latanoprost  1 drop Both Eyes QHS  . levothyroxine  50 mcg Oral QAC breakfast  . pantoprazole  40 mg Oral Daily   . sertraline  100 mg Oral Daily  . Warfarin - Pharmacist Dosing Inpatient   Does not apply Q24H   Continuous Infusions: . piperacillin-tazobactam (ZOSYN)  IV       LOS: 0 days    Time spent: [place], MD [facility] Pager [phone]  If 7PM-7AM, please contact night-coverage [email or web address] Password [email or web address] 03/04/2017, 1:48 PM

## 2017-03-04 NOTE — Care Management (Signed)
Patient and family elect to go home with home health. Patient has resumption order placed. Tim of [facility] home [facility] aware. Patient will need HF oxygen at home. When qualifying note available from nurse, CM will send notes to [place]. Anticipate patient will need EMS transport home.

## 2017-03-05 ENCOUNTER — Inpatient Hospital Stay (HOSPITAL_COMMUNITY): Payer: Medicare Other

## 2017-03-05 ENCOUNTER — Telehealth: Payer: Self-pay | Admitting: *Deleted

## 2017-03-05 DIAGNOSIS — J189 Pneumonia, unspecified organism: Secondary | ICD-10-CM

## 2017-03-05 LAB — COMPREHENSIVE METABOLIC PANEL
ALBUMIN: 3 g/dL — AB (ref 3.5–5.0)
ALT: 22 U/L (ref 14–54)
AST: 21 U/L (ref 15–41)
Alkaline Phosphatase: 85 U/L (ref 38–126)
Anion gap: 7 (ref 5–15)
BUN: 21 mg/dL — AB (ref 6–20)
CHLORIDE: 102 mmol/L (ref 101–111)
CO2: 26 mmol/L (ref 22–32)
CREATININE: 0.7 mg/dL (ref 0.44–1.00)
Calcium: 8.4 mg/dL — ABNORMAL LOW (ref 8.9–10.3)
GFR calc Af Amer: >60 mL/min (ref >60)
Glucose, Bld: 122 mg/dL — ABNORMAL HIGH (ref 65–99)
POTASSIUM: 4.1 mmol/L (ref 3.5–5.1)
SODIUM: 135 mmol/L (ref 135–145)
Total Bilirubin: 1.2 mg/dL (ref 0.3–1.2)
Total Protein: 6.5 g/dL (ref 6.5–8.1)

## 2017-03-05 LAB — BLOOD GAS, ARTERIAL
Acid-base deficit: 0.7 mmol/L (ref 0.0–2.0)
Bicarbonate: 24.1 mmol/L (ref 20.0–28.0)
Drawn by: 277331
O2 CONTENT: 10 L/min
O2 SAT: 90.4 %
PATIENT TEMPERATURE: 37
PCO2 ART: 33.2 mmHg (ref 32.0–48.0)
pH, Arterial: 7.45 (ref 7.350–7.450)
pO2, Arterial: 61.5 mmHg — ABNORMAL LOW (ref 83.0–108.0)

## 2017-03-05 LAB — CBC
HCT: 36.7 % (ref 36.0–46.0)
HEMOGLOBIN: 12.1 g/dL (ref 12.0–15.0)
MCH: 33.5 pg (ref 26.0–34.0)
MCHC: 33 g/dL (ref 30.0–36.0)
MCV: 101.7 fL — AB (ref 78.0–100.0)
PLATELETS: 272 10*3/uL (ref 150–400)
RBC: 3.61 MIL/uL — ABNORMAL LOW (ref 3.87–5.11)
RDW: 15.3 % (ref 11.5–15.5)
WBC: 8.1 10*3/uL (ref 4.0–10.5)

## 2017-03-05 LAB — PROTIME-INR
INR: 2.86
Prothrombin Time: 30.6 seconds — ABNORMAL HIGH (ref 11.4–15.2)

## 2017-03-05 MED ORDER — SALINE SPRAY 0.65 % NA SOLN
1.0000 | NASAL | Status: DC | PRN
  Administered 2017-03-05: 1 via NASAL
  Filled 2017-03-05: qty 44

## 2017-03-05 MED ORDER — IOPAMIDOL (ISOVUE-300) INJECTION 61%
75.0000 mL | Freq: Once | INTRAVENOUS | Status: AC | PRN
  Administered 2017-03-05: 75 mL via INTRAVENOUS

## 2017-03-05 MED ORDER — WARFARIN SODIUM 2 MG PO TABS
2.0000 mg | ORAL_TABLET | Freq: Once | ORAL | Status: AC
  Administered 2017-03-05: 2 mg via ORAL
  Filled 2017-03-05: qty 1

## 2017-03-05 NOTE — Progress Notes (Signed)
Pt. Continues to have a decrease in oxygen saturation throughout the night. O2 increased from 9 to 10 L high flow. O2 sats range between 79-96%. MD made aware, pt doesn't sustain low sats for extended periods of time. Upon assessment, no acute distress. Will continue to monitor.

## 2017-03-05 NOTE — Care Management Important Message (Signed)
Important Message  Patient Details  Name: [name] MRN: [id number] Date of Birth: [birth year]   Medicare Important Message Given:  Yes    Sherald Barge, RN 03/05/2017, 11:42 AM

## 2017-03-05 NOTE — Patient Outreach (Signed)
[facility]) Care Management  03/05/2017  [name] [birth year] [id number]   Care coordination  [name] remains hospitalized on 03/05/17 at [facility] after admission after a fall at home resulting in left rib fracture, HCAP PMH compression fracture, COPD, HOH, Afib, HTN, diet controlled DM, depression, CHF Noted SW and CM notes.   Patient and family preference is for discharge home as of 03/04/17 with [facility] home [facility] 3 times a week Has DME at home to include oxygen, rollator, Bedside commode, Hospital bed, glucometer, cane, nutritional supplement drinks, scales and eyeglasses  Transferred to stepdown for close observation on 03/05/17 for oxygen desats (O2 sats range between 79-96%)  Plans to see and follow up with [name] after discharge from hospital for [facility] CM transition of care services, Continue to collaborate with hospital CM/Sw prn    Nykira Reddix L. Lavina Hamman, RN, BSN, [place] [facility] [phone]

## 2017-03-05 NOTE — Progress Notes (Signed)
PT Cancellation Note  Patient Details Name: [name] MRN: [id number] DOB: 01/19/1926   Cancelled Treatment:    Reason Eval/Treat Not Completed: Medical issues which prohibited therapy, patient requiring High flow oxygen to maintain sats. Up with nursing several times. To transfer to [place] PT [phone]  03/05/2017, 3:52 PM

## 2017-03-05 NOTE — Progress Notes (Signed)
PROGRESS NOTE    [name]  PRF:[id number] DOB: Apr 27, 1926 DOA: 03/13/2017 PCP: Ria Bush, MD    Brief Narrative:   83 y.o. female with medical history significant of compression fractures s/p vertebroplasty; HLD; macular degeneration; hypothyroidism; glaucoma; GERD; HTN; DM; COPD on 2L home O2; CKD; afib on Coumadin; and diastolic heart failure presenting with SOB after a fall was found to have displaced left fifth rib fracture.  Assessment & Plan:     HCAP (healthcare-associated pneumonia)   Acute on chronic respiratory failure with hypoxia (HCC)   Hypothyroidism   Compression fracture of vertebrae (HCC)   Chronic anticoagulation   Closed traumatic displaced fracture of one rib of left side    Improving , wean off the o2 , because of her use of AC and the rib fracture I will exclude the hemothorax by doing CT chest , continue Abx , PT/OT .  DVT prophylaxis: anticoagulated Code Status: DNR Family Communication: (son) Disposition Plan: (PT/OT EVAL   Consultants:   NONE   Procedures: NONE   Antimicrobials: (specify start and planned stop date. Auto populated tables are space occupying and do not give end dates) ZOSYN/VANC   Subjective:   States she feels better shortness of breath is improving left lower pain is moderate now no other issues.   Objective: Vitals:   03/04/17 2213 03/05/17 0459 03/05/17 0508 03/05/17 0736  BP: 120/72 131/79    Pulse: 83  75   Resp: 16 16    Temp: 98.1 F (36.7 C) 98.2 F (36.8 C)    TempSrc: Oral Oral    SpO2: 90% (!) 82%  (!) 85%  Weight:      Height:        Intake/Output Summary (Last 24 hours) at 03/05/17 1039 Last data filed at 03/05/17 0900  Gross per 24 hour  Intake              460 ml  Output              600 ml  Net             -140 ml   Filed Weights   03/15/2017 1335  Weight: 56.7 kg (125 lb)    Examination:  General exam: Appears calm and comfortable  Respiratory system:Lungs were clear to  auscultation AT THE UPPER PARTS, left lower lobe auscultation showed decreased air entry . Cardiovascular system: S1 & S2 heard, RRR. No JVD, murmurs, rubs, gallops or clicks. No pedal edema. Gastrointestinal system: Abdomen is nondistended, soft and nontender. No organomegaly or masses felt. Normal bowel sounds heard. Central nervous system: Alert and oriented. No focal neurological deficits. Extremities: Symmetric 5 x 5 power. Skin: No rashes, lesions or ulcers Psychiatry: Judgement and insight appear normal. Mood & affect appropriate.     Data Reviewed: I have personally reviewed following labs and imaging studies  CBC:  Recent Labs Lab 02/22/2017 1410 03/03/17 0622 03/05/17 0741  WBC 11.0* 8.3 8.1  NEUTROABS 8.3*  --   --   HGB 12.4 11.1* 12.1  HCT 37.2 33.6* 36.7  MCV 100.8* 102.8* 101.7*  PLT 250 232 935   Basic Metabolic Panel:  Recent Labs Lab 02/17/2017 1410 03/03/17 0622 03/05/17 0741  NA 133* 139 135  K 4.0 4.0 4.1  CL 102 106 102  CO2 23 26 26   GLUCOSE 147* 92 122*  BUN 20 18 21*  CREATININE 0.73 0.66 0.70  CALCIUM 8.9 8.2* 8.4*   GFR: Estimated Creatinine Clearance:  37.7 mL/min (by C-G formula based on SCr of 0.7 mg/dL). Liver Function Tests:  Recent Labs Lab 03/05/17 0741  AST 21  ALT 22  ALKPHOS 85  BILITOT 1.2  PROT 6.5  ALBUMIN 3.0*   No results for input(s): LIPASE, AMYLASE in the last 168 hours. No results for input(s): AMMONIA in the last 168 hours. Coagulation Profile:  Recent Labs Lab 03/11/2017 1410 03/03/17 0622 03/04/17 0529 03/05/17 0741  INR 2.97 3.85 3.82 2.86   Cardiac Enzymes: No results for input(s): CKTOTAL, CKMB, CKMBINDEX, TROPONINI in the last 168 hours. BNP (last 3 results)  Recent Labs  01/28/17 1249  PROBNP 742.0*   HbA1C: No results for input(s): HGBA1C in the last 72 hours. CBG: No results for input(s): GLUCAP in the last 168 hours. Lipid Profile: No results for input(s): CHOL, HDL, LDLCALC, TRIG,  CHOLHDL, LDLDIRECT in the last 72 hours. Thyroid Function Tests: No results for input(s): TSH, T4TOTAL, FREET4, T3FREE, THYROIDAB in the last 72 hours. Anemia Panel: No results for input(s): VITAMINB12, FOLATE, FERRITIN, TIBC, IRON, RETICCTPCT in the last 72 hours. Urine analysis:    Component Value Date/Time   COLORURINE YELLOW 07/26/2016 2219   APPEARANCEUR HAZY (A) 07/26/2016 2219   LABSPEC 1.004 (L) 07/26/2016 2219   PHURINE 8.0 07/26/2016 2219   GLUCOSEU NEGATIVE 07/26/2016 2219   HGBUR NEGATIVE 07/26/2016 2219   BILIRUBINUR neg 11/03/2016 1324   KETONESUR NEGATIVE 07/26/2016 2219   PROTEINUR neg 11/03/2016 1324   PROTEINUR NEGATIVE 07/26/2016 2219   UROBILINOGEN negative (A) 11/03/2016 1324   UROBILINOGEN 0.2 12/25/2013 2250   NITRITE neg 11/03/2016 1324   NITRITE NEGATIVE 07/26/2016 2219   LEUKOCYTESUR Small (1+) (A) 11/03/2016 1324   Sepsis Labs: @LABRCNTIP (procalcitonin:4,lacticidven:4)  )No results found for this or any previous visit (from the past 240 hour(s)).       Radiology Studies: Dg Chest 1 View  Result Date: 03/04/2017 CLINICAL DATA:  Shortness of Breath EXAM: CHEST 1 VIEW COMPARISON:  March 02, 2017 FINDINGS: There is extensive airspace consolidation throughout the left mid lower lung zones with left pleural effusion. There is a small right pleural effusion with slight atelectasis in the right base. There is cardiomegaly with pulmonary venous hypertension. No adenopathy. Aorta is tortuous with atherosclerotic calcification in the aorta. No evident adenopathy. Recently noted left fifth rib fracture is not appreciable on this examination. No pneumothorax. IMPRESSION: Airspace consolidation, likely pneumonia, throughout the left mid and lower lung zones. There is a moderate left effusion with a rather minimal right pleural effusion. There is pulmonary vascular congestion. Cardiomegaly is stable. Aorta is somewhat tortuous with aortic atherosclerosis. Aortic  Atherosclerosis (ICD10-I70.0). Electronically Signed   By: Lowella Grip III M.D.   On: 03/04/2017 12:45        Scheduled Meds: . calcium-vitamin D  1 tablet Oral BID  . docusate sodium  100 mg Oral BID  . donepezil  5 mg Oral QHS  . felodipine  2.5 mg Oral Daily  . fluticasone furoate-vilanterol  1 puff Inhalation Daily  . latanoprost  1 drop Both Eyes QHS  . levothyroxine  50 mcg Oral QAC breakfast  . pantoprazole  40 mg Oral Daily  . sertraline  100 mg Oral Daily  . Warfarin - Pharmacist Dosing Inpatient   Does not apply Q24H   Continuous Infusions: . piperacillin-tazobactam (ZOSYN)  IV 3.375 g (03/05/17 0649)  . vancomycin       LOS: 1 day    Time spent: 35 m  Waldron Session, MD [facility] Pager [phone]  If 7PM-7AM, please contact night-coverage [email or web address] Password [facility] 03/05/2017, 10:39 AM

## 2017-03-05 NOTE — Progress Notes (Signed)
ANTICOAGULATION CONSULT NOTE - follow up  Pharmacy Consult for Warfarin Indication: atrial fibrillation  Allergies  Allergen Reactions  . Propoxyphene N-Acetaminophen Shortness Of Breath  . Anoro Ellipta [Umeclidinium-Vilanterol] Other (See Comments)    Blurry Vision   . Codeine Nausea And Vomiting  . Meperidine Hcl Nausea And Vomiting  . Morphine Nausea And Vomiting  . Shellfish Allergy Hives    Patient Measurements: Height: 5\' 2"  (157.5 cm) Weight: 125 lb (56.7 kg) IBW/kg (Calculated) : 50.1  Vital Signs: Temp: 98.2 F (36.8 C) (08/17 0459) Temp Source: Oral (08/17 0459) BP: 131/79 (08/17 0459) Pulse Rate: 75 (08/17 0508)  Labs:  Recent Labs  02/22/2017 1410 03/03/17 0622 03/04/17 0529 03/05/17 0741  HGB 12.4 11.1*  --  12.1  HCT 37.2 33.6*  --  36.7  PLT 250 232  --  272  LABPROT 31.5* 38.8* 38.6* 30.6*  INR 2.97 3.85 3.82 2.86  CREATININE 0.73 0.66  --  0.70    Estimated Creatinine Clearance: 37.7 mL/min (by C-G formula based on SCr of 0.7 mg/dL).   Medical History: Past Medical History:  Diagnosis Date  . Anemia   . Anxiety   . Aortic sclerosis   . Arthritis   . CHF (congestive heart failure) ([place])   . Chronic atrial fibrillation ([place])   . Chronic gastritis 10/2009   Duodenitis on EGD  . CKD (chronic kidney disease) stage 2, GFR 60-89 ml/min   . Collagen vascular disease ([place])   . COPD (chronic obstructive pulmonary disease) ([place])   . Depression   . Diabetes type 2, controlled ([place])    Diet controlled  . Diverticulosis   . Dry ARMD    Retinal hemorrhage (09/2013) [place]  . Essential hypertension, benign   . GERD (gastroesophageal reflux disease)   . Glaucoma    Dr. Venetia Maxon  . Hiatal hernia 2011   Small  . History of pelvic fracture April 2013  . History of rheumatic fever   . History of skin cancer   . History of TIA (transient ischemic attack)   . Hypothyroidism   . Macular degeneration   . Mixed hyperlipidemia   . Osteoporosis 04/2013    Thoracic compression fracture, on bisphosphonate and cal/vit D  . Oxygen deficiency   . Pulmonary nodules    Stable bilateral on CT 01/2010, rec rpt yearly for 2 yrs, pt decided to stop f/u  . Seasonal allergies   . Vertebral compression fracture ([place]) 05/2016   Vertebroplasty L2/L3    Medications:  Prescriptions Prior to Admission  Medication Sig Dispense Refill Last Dose  . acetaminophen (TYLENOL) 500 MG tablet Take 2 tablets (1,000 mg total) by mouth 3 (three) times daily. (Patient taking differently: Take 1,000 mg by mouth every 8 (eight) hours as needed for mild pain. )   Taking  . Blood Glucose Monitoring Suppl (FREESTYLE LITE) DEVI Check blood sugar once daily and as directed. Dx E11.9 90 each 1 Taking  . Calcium-Magnesium-Vitamin D 767-209-470 MG-MG-UNIT TABS Take 1 tablet by mouth 2 (two) times daily.   03/10/2017 at Unknown time  . donepezil (ARICEPT) 5 MG tablet TAKE 1 TABLET(5 MG) BY MOUTH AT BEDTIME 30 tablet 6 03/01/2017 at Unknown time  . felodipine (PLENDIL) 2.5 MG 24 hr tablet TAKE 1 TABLET DAILY 90 tablet 1 02/18/2017 at Unknown time  . ferrous sulfate 325 (65 FE) MG tablet Take 325 mg by mouth daily with breakfast.   03/01/2017 at Unknown time  . fluticasone furoate-vilanterol (BREO ELLIPTA) 100-25 MCG/INH  AEPB Inhale 1 puff into the lungs daily. 1 each 6 03/01/2017 at Unknown time  . furosemide (LASIX) 40 MG tablet Take 80mg  in the morning, 40mg  in afternoon 90 tablet 6 02/20/2017 at Unknown time  . glucose blood (FREESTYLE LITE) test strip Check blood sugar once daily and as directed. Dx E11.9 100 each 1 Taking  . Lancets (FREESTYLE) lancets Check blood sugar once daily and as directed. Dx E11.9 100 each 1 Taking  . latanoprost (XALATAN) 0.005 % ophthalmic solution Place 1 drop into both eyes at bedtime.    03/01/2017 at Unknown time  . lidocaine (LIDODERM) 5 % Place 1 patch onto the skin daily. Remove & Discard patch within 12 hours or as directed by MD 30 patch 6 02/28/2017 at  Unknown time  . LORazepam (ATIVAN) 0.5 MG tablet Take 1 tablet (0.5 mg total) by mouth every 8 (eight) hours as needed for anxiety. 60 tablet 0 02/28/2017 at Unknown time  . Misc Natural Products (FIBER 7) POWD Take by mouth daily.   Taking  . Multiple Vitamin (MULTIVITAMIN) capsule Take 1 capsule by mouth every morning.    03/01/2017 at Unknown time  . Omega-3 Fatty Acids (FISH OIL CONCENTRATE) 1000 MG CAPS Take 2 capsules by mouth 2 (two) times daily.     02/23/2017 at Unknown time  . potassium chloride SA (K-DUR,KLOR-CON) 20 MEQ tablet Take 1 tablet (20 mEq total) by mouth daily. 30 tablet 6 03/13/2017 at Unknown time  . sertraline (ZOLOFT) 100 MG tablet Take 1 tablet (100 mg total) by mouth daily. 90 tablet 1 03/16/2017 at Unknown time  . vitamin B-12 (CYANOCOBALAMIN) 500 MCG tablet Take 500 mcg by mouth daily.   02/20/2017 at Unknown time  . warfarin (COUMADIN) 2.5 MG tablet Take 1 tablet daily except 1 1/2 tablets on Mondays and Thursdays (Patient taking differently: Take 3.75 mg by mouth every evening. 2 tablets mon,tue, wed,fri,and 1/2 tablet on tue,thur, sat sun) 180 tablet 3 03/17/2017 at 800  . levothyroxine (SYNTHROID, LEVOTHROID) 50 MCG tablet TAKE 1 TABLET DAILY 90 tablet 3 Taking   Assessment: Okay for Protocol, Recent fall will rib pain. INR 2.86 this AM  Goal of Therapy:  INR 2-3   Plan:  Warfarin 2mg  PO x 1 Daily PT/INR Monitor for signs and symptoms of bleeding.   Pricilla Larsson, [facility] 03/05/2017,11:57 AM

## 2017-03-05 NOTE — Progress Notes (Signed)
Patient requires high flow o2 , will do ABG and sent to step down for close obs.

## 2017-03-06 LAB — MRSA PCR SCREENING: MRSA BY PCR: NEGATIVE

## 2017-03-06 LAB — COMPREHENSIVE METABOLIC PANEL
ALBUMIN: 3.1 g/dL — AB (ref 3.5–5.0)
ALT: 21 U/L (ref 14–54)
ANION GAP: 7 (ref 5–15)
AST: 22 U/L (ref 15–41)
Alkaline Phosphatase: 85 U/L (ref 38–126)
BILIRUBIN TOTAL: 1.3 mg/dL — AB (ref 0.3–1.2)
BUN: 17 mg/dL (ref 6–20)
CO2: 26 mmol/L (ref 22–32)
Calcium: 8.4 mg/dL — ABNORMAL LOW (ref 8.9–10.3)
Chloride: 105 mmol/L (ref 101–111)
Creatinine, Ser: 0.67 mg/dL (ref 0.44–1.00)
GFR calc Af Amer: >60 mL/min (ref >60)
GFR calc non Af Amer: >60 mL/min (ref >60)
GLUCOSE: 116 mg/dL — AB (ref 65–99)
POTASSIUM: 3.8 mmol/L (ref 3.5–5.1)
Sodium: 138 mmol/L (ref 135–145)
TOTAL PROTEIN: 6.5 g/dL (ref 6.5–8.1)

## 2017-03-06 LAB — CBC
HEMATOCRIT: 36.8 % (ref 36.0–46.0)
Hemoglobin: 12.4 g/dL (ref 12.0–15.0)
MCH: 34.5 pg — ABNORMAL HIGH (ref 26.0–34.0)
MCHC: 33.7 g/dL (ref 30.0–36.0)
MCV: 102.5 fL — ABNORMAL HIGH (ref 78.0–100.0)
Platelets: 276 10*3/uL (ref 150–400)
RBC: 3.59 MIL/uL — ABNORMAL LOW (ref 3.87–5.11)
RDW: 15.3 % (ref 11.5–15.5)
WBC: 9.8 10*3/uL (ref 4.0–10.5)

## 2017-03-06 LAB — PROTIME-INR
INR: 2.43
PROTHROMBIN TIME: 26.9 s — AB (ref 11.4–15.2)

## 2017-03-06 MED ORDER — ALBUTEROL SULFATE (2.5 MG/3ML) 0.083% IN NEBU
INHALATION_SOLUTION | RESPIRATORY_TRACT | Status: AC
  Administered 2017-03-06: 2.5 mg
  Filled 2017-03-06: qty 3

## 2017-03-06 MED ORDER — ALBUTEROL SULFATE (2.5 MG/3ML) 0.083% IN NEBU
2.5000 mg | INHALATION_SOLUTION | Freq: Three times a day (TID) | RESPIRATORY_TRACT | Status: DC
  Administered 2017-03-06 (×3): 2.5 mg via RESPIRATORY_TRACT
  Filled 2017-03-06 (×3): qty 3

## 2017-03-06 MED ORDER — WARFARIN SODIUM 2 MG PO TABS
3.0000 mg | ORAL_TABLET | Freq: Once | ORAL | Status: AC
  Administered 2017-03-06: 16:00:00 3 mg via ORAL
  Filled 2017-03-06: qty 1

## 2017-03-06 MED ORDER — FUROSEMIDE 10 MG/ML IJ SOLN
20.0000 mg | Freq: Once | INTRAMUSCULAR | Status: AC
  Administered 2017-03-06: 20 mg via INTRAVENOUS
  Filled 2017-03-06: qty 2

## 2017-03-06 MED ORDER — METHYLPREDNISOLONE SODIUM SUCC 40 MG IJ SOLR
40.0000 mg | Freq: Two times a day (BID) | INTRAMUSCULAR | Status: DC
  Administered 2017-03-06 – 2017-03-08 (×5): 40 mg via INTRAVENOUS
  Filled 2017-03-06 (×5): qty 1

## 2017-03-06 MED ORDER — ORAL CARE MOUTH RINSE
15.0000 mL | Freq: Two times a day (BID) | OROMUCOSAL | Status: DC
  Administered 2017-03-06 – 2017-03-08 (×3): 15 mL via OROMUCOSAL

## 2017-03-06 MED ORDER — ALBUTEROL SULFATE (2.5 MG/3ML) 0.083% IN NEBU
2.5000 mg | INHALATION_SOLUTION | RESPIRATORY_TRACT | Status: DC | PRN
  Administered 2017-03-06: 2.5 mg via RESPIRATORY_TRACT
  Filled 2017-03-06: qty 3

## 2017-03-06 MED ORDER — LORAZEPAM 0.5 MG PO TABS
0.5000 mg | ORAL_TABLET | Freq: Three times a day (TID) | ORAL | Status: DC | PRN
  Administered 2017-03-06 – 2017-03-07 (×4): 0.5 mg via ORAL
  Filled 2017-03-06 (×4): qty 1

## 2017-03-06 MED ORDER — ALBUTEROL SULFATE (2.5 MG/3ML) 0.083% IN NEBU
INHALATION_SOLUTION | RESPIRATORY_TRACT | Status: AC
  Administered 2017-03-06: 2.5 mg via RESPIRATORY_TRACT
  Filled 2017-03-06: qty 3

## 2017-03-06 NOTE — Consult Note (Signed)
Consult requested by: [facility], Dr.Sabia Consult requested for: Acute on chronic hypoxic respiratory failure  HPI: This is an 83 year old who was admitted on the 14th after a fall. She developed increasing pain and eventually came to the emergency department and was found to have hypoxia. She wears 2 L of oxygen at home. She has significant history of compression fractures in her vertebrae status post vertebral plasty hyperlipidemia hypothyroidism glaucoma macular degeneration reflux hypertension diabetes COPD chronic kidney disease atrial fib on Coumadin and diastolic heart failure. She fell about 3 days prior to admission and felt like she probably broke a rib. She's not having any back pain. She had initially been admitted to the floor and did fairly well although with increased oxygen requirement but then she developed increasing respiratory problems and was transferred to stepdown last night. This morning she says she feels okay with no new complaints. She says her breathing feels better. She still has some chest pain. She's not coughing not producing any sputum no abdominal pain nausea vomiting diarrhea.  Past Medical History:  Diagnosis Date  . Anemia   . Anxiety   . Aortic sclerosis   . Arthritis   . CHF (congestive heart failure) ([place])   . Chronic atrial fibrillation ([place])   . Chronic gastritis 10/2009   Duodenitis on EGD  . CKD (chronic kidney disease) stage 2, GFR 60-89 ml/min   . Collagen vascular disease ([place])   . COPD (chronic obstructive pulmonary disease) ([place])   . Depression   . Diabetes type 2, controlled ([place])    Diet controlled  . Diverticulosis   . Dry ARMD    Retinal hemorrhage (09/2013) [place]  . Essential hypertension, benign   . GERD (gastroesophageal reflux disease)   . Glaucoma    Dr. Venetia Maxon  . Hiatal hernia 2011   Small  . History of pelvic fracture April 2013  . History of rheumatic fever   . History of skin cancer   . History of TIA (transient  ischemic attack)   . Hypothyroidism   . Macular degeneration   . Mixed hyperlipidemia   . Osteoporosis 04/2013   Thoracic compression fracture, on bisphosphonate and cal/vit D  . Oxygen deficiency   . Pulmonary nodules    Stable bilateral on CT 01/2010, rec rpt yearly for 2 yrs, pt decided to stop f/u  . Seasonal allergies   . Vertebral compression fracture ([place]) 05/2016   Vertebroplasty L2/L3     Family History  Problem Relation Age of Onset  . Coronary artery disease Mother 55       MI deceased  . Colon cancer Father 69  . Asthma Father   . Asthma Sister   . Stroke Paternal Grandmother   . Diabetes Neg Hx      Social History   Social History  . Marital status: Married    Spouse name: N/A  . Number of children: N/A  . Years of education: N/A   Occupational History  . Retired     Former Therapist, sports   Social History Main Topics  . Smoking status: Former Smoker    Packs/day: 1.00    Years: 30.00    Types: Cigarettes    Quit date: 10/13/1998  . Smokeless tobacco: Never Used  . Alcohol use No  . Drug use: No  . Sexual activity: No   Other Topics Concern  . 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      [facility] (10/13/16):   Originally from [place]. Moved to the [place] at 83 y.o. She has lived in [place] and moved to [place] in 1963. Retired Therapist, sports. No known TB exposure. Has dogs currently. No bird or mold exposure.         ROS: Except as mentioned 10 point review of systems is negative    Objective: Vital signs in last 24 hours: Temp:  [97.6 F (36.4 C)-98.6 F (37 C)] 97.8 F (36.6 C) (08/18 0730) Pulse Rate:  [74-101] 84 (08/18 0730) Resp:  [14-24] 19 (08/18 0730) BP: (100-132)/(60-83) 115/71  (08/18 0600) SpO2:  [69 %-98 %] 95 % (08/18 0730) Weight:  [59.4 kg (130 lb 15.3 oz)-60 kg (132 lb 4.4 oz)] 59.4 kg (130 lb 15.3 oz) (08/18 0522) Weight change:  Last BM Date: 03/04/17  Intake/Output from previous day: 08/17 0701 - 08/18 0700 In: 460 [P.O.:360; IV Piggyback:100] Out: 100 [Urine:100]  PHYSICAL EXAM Constitutional: She is awake and alert and looks fairly comfortable. Eyes: Pupils react. Vision is diminished. Ears nose mouth and throat: Her throat is clear. Hearing is mildly diminished. Cardiovascular: She is in atrial fib with normal ventricular response at about 80. Respiratory: She has some rhonchi bilaterally but no wheezing. She is not splinting. Gastrointestinal: Her abdomen is soft with no masses. Skin: Warm and dry. Musculoskeletal: Normal strength. Neurological: No focal abnormalities. Psychiatric: She is mildly anxious  Lab Results: Basic Metabolic Panel:  Recent Labs  03/05/17 0741 03/06/17 0601  NA 135 138  K 4.1 3.8  CL 102 105  CO2 26 26  GLUCOSE 122* 116*  BUN 21* 17  CREATININE 0.70 0.67  CALCIUM 8.4* 8.4*   Liver Function Tests:  Recent Labs  03/05/17 0741 03/06/17 0601  AST 21 22  ALT 22 21  ALKPHOS 85 85  BILITOT 1.2 1.3*  PROT 6.5 6.5  ALBUMIN 3.0* 3.1*   No results for input(s): LIPASE, AMYLASE in the last 72 hours. No results for input(s): AMMONIA in the last 72 hours. CBC:  Recent Labs  03/05/17 0741 03/06/17 0601  WBC 8.1 9.8  HGB 12.1 12.4  HCT 36.7 36.8  MCV 101.7* 102.5*  PLT 272 276   Cardiac Enzymes: No results for input(s): CKTOTAL, CKMB, CKMBINDEX, TROPONINI in the last 72 hours. BNP: No results for input(s): PROBNP in the last 72 hours. D-Dimer: No results for input(s): DDIMER in the last 72 hours. CBG: No results for input(s): GLUCAP in the last 72 hours. Hemoglobin A1C: No results for input(s): HGBA1C in the last 72 hours. Fasting Lipid Panel: No results for input(s): CHOL, HDL, LDLCALC, TRIG, CHOLHDL,  LDLDIRECT in the last 72 hours. Thyroid Function Tests: No results for input(s): TSH, T4TOTAL, FREET4, T3FREE, THYROIDAB in the last 72 hours. Anemia Panel: No results for input(s): VITAMINB12, FOLATE, FERRITIN, TIBC, IRON, RETICCTPCT in the last 72 hours. Coagulation:  Recent Labs  03/05/17 0741 03/06/17 0601  LABPROT 30.6* 26.9*  INR 2.86 2.43   Urine Drug Screen: Drugs of Abuse  No results found for: LABOPIA, COCAINSCRNUR, LABBENZ, AMPHETMU, THCU, LABBARB  Alcohol Level: No results for input(s): ETH in the last 72 hours. Urinalysis: No results for input(s): COLORURINE, LABSPEC, [place], [place],  HGBUR, BILIRUBINUR, KETONESUR, PROTEINUR, UROBILINOGEN, NITRITE, LEUKOCYTESUR in the last 72 hours.  Invalid input(s): APPERANCEUR Misc. Labs:   ABGS:  Recent Labs  03/05/17 1200  PHART 7.450  PO2ART 61.5*  HCO3 24.1     MICROBIOLOGY: No results found for this or any previous visit (from the past 240 hour(s)).  Studies/Results: Dg Chest 1 View  Result Date: 03/04/2017 CLINICAL DATA:  Shortness of Breath EXAM: CHEST 1 VIEW COMPARISON:  March 02, 2017 FINDINGS: There is extensive airspace consolidation throughout the left mid lower lung zones with left pleural effusion. There is a small right pleural effusion with slight atelectasis in the right base. There is cardiomegaly with pulmonary venous hypertension. No adenopathy. Aorta is tortuous with atherosclerotic calcification in the aorta. No evident adenopathy. Recently noted left fifth rib fracture is not appreciable on this examination. No pneumothorax. IMPRESSION: Airspace consolidation, likely pneumonia, throughout the left mid and lower lung zones. There is a moderate left effusion with a rather minimal right pleural effusion. There is pulmonary vascular congestion. Cardiomegaly is stable. Aorta is somewhat tortuous with aortic atherosclerosis. Aortic Atherosclerosis (ICD10-I70.0). Electronically Signed   By: Lowella Grip III M.D.   On: 03/04/2017 12:45   Ct Chest W Contrast  Result Date: 03/05/2017 CLINICAL DATA:  Inpatient.  Pneumonia.  Dyspnea.  CHF. EXAM: CT CHEST WITH CONTRAST TECHNIQUE: Multidetector CT imaging of the chest was performed during intravenous contrast administration. CONTRAST:  29mL ISOVUE-300 IOPAMIDOL (ISOVUE-300) INJECTION 61% COMPARISON:  Chest radiograph from one day prior. 09/14/2016 chest CT. FINDINGS: Cardiovascular: Mild-to-moderate cardiomegaly, increased since 09/14/2016 . Trace pericardial effusion/thickening, not appreciably changed. Left main, left anterior descending, left circumflex and right coronary atherosclerosis. Atherosclerotic nonaneurysmal thoracic aorta. Stable top-normal caliber main pulmonary artery (3.0 cm diameter). No central pulmonary emboli. Mediastinum/Nodes: No discrete thyroid nodules. Unremarkable esophagus. No axillary adenopathy. Mild right paratracheal adenopathy measuring up to 1.1 cm (series 2/ image 59), not appreciably changed since 09/14/2016 using similar measurement technique. Stable enlarged 1.4 cm subcarinal node (series 2/ image 64). Mild bilateral hilar adenopathy measuring up to 1.3 cm on the right (series 2/ image 66), subjectively stable since the prior noncontrast chest CT. Lungs/Pleura: No pneumothorax. Small dependent right pleural effusion. Moderate dependent left pleural effusion. Moderate centrilobular emphysema. Mild compressive atelectasis in the dependent right lower and right upper lobes. Complete left lower lobe atelectasis. Moderate dependent lingular compressive atelectasis. No evidence of central airway stenosis. No discrete lung masses or significant pulmonary nodules in the aerated portions of the lungs. Interlobular septal thickening throughout both lungs. Upper abdomen: Contrast reflux into the IVC and hepatic veins. Exophytic simple 1.4 cm medial upper left renal cyst. Musculoskeletal: No aggressive appearing focal osseous  lesions. Acute minimally displaced posterior left seventh and eighth rib fractures. Acute nondisplaced lateral left fourth, fifth, sixth and seventh rib fractures. Moderate T11 vertebral compression fracture is new since 09/14/2016. Chronic mild T7, mild T8, mild T10, mild to moderate T12 and superior L1, L2 and L3 vertebral compression fractures are stable, noting stable vertebroplasty material in the L2 and L3 vertebral bodies. Moderate thoracic spondylosis. IMPRESSION: 1. Spectrum of findings compatible with congestive heart failure, including cardiomegaly, diffuse interlobular septal thickening indicating mild pulmonary edema and small right and moderate left dependent pleural effusions. 2. Complete left lower lobe atelectasis. Mild-to-moderate compressive atelectasis in the dependent right upper and right lower lobes and lingula. 3. Stable mild mediastinal and bilateral hilar lymphadenopathy, compatible with reactive adenopathy. 4. Acute left fourth through eighth rib fractures. Moderate T11  vertebral compression fracture is new since 09/14/2016. 5. Left main and 3 vessel coronary atherosclerosis. Aortic Atherosclerosis (ICD10-I70.0) and Emphysema (ICD10-J43.9). Electronically Signed   By: Ilona Sorrel M.D.   On: 03/05/2017 17:29    Medications:  Prior to Admission:  Prescriptions Prior to Admission  Medication Sig Dispense Refill Last Dose  . acetaminophen (TYLENOL) 500 MG tablet Take 2 tablets (1,000 mg total) by mouth 3 (three) times daily. (Patient taking differently: Take 1,000 mg by mouth every 8 (eight) hours as needed for mild pain. )   Taking  . Blood Glucose Monitoring Suppl (FREESTYLE LITE) DEVI Check blood sugar once daily and as directed. Dx E11.9 90 each 1 Taking  . Calcium-Magnesium-Vitamin D 347-425-956 MG-MG-UNIT TABS Take 1 tablet by mouth 2 (two) times daily.   02/22/2017 at Unknown time  . donepezil (ARICEPT) 5 MG tablet TAKE 1 TABLET(5 MG) BY MOUTH AT BEDTIME 30 tablet 6 03/01/2017  at Unknown time  . felodipine (PLENDIL) 2.5 MG 24 hr tablet TAKE 1 TABLET DAILY 90 tablet 1 03/04/2017 at Unknown time  . ferrous sulfate 325 (65 FE) MG tablet Take 325 mg by mouth daily with breakfast.   03/07/2017 at Unknown time  . fluticasone furoate-vilanterol (BREO ELLIPTA) 100-25 MCG/INH AEPB Inhale 1 puff into the lungs daily. 1 each 6 03/01/2017 at Unknown time  . furosemide (LASIX) 40 MG tablet Take 80mg  in the morning, 40mg  in afternoon 90 tablet 6 02/19/2017 at Unknown time  . glucose blood (FREESTYLE LITE) test strip Check blood sugar once daily and as directed. Dx E11.9 100 each 1 Taking  . Lancets (FREESTYLE) lancets Check blood sugar once daily and as directed. Dx E11.9 100 each 1 Taking  . latanoprost (XALATAN) 0.005 % ophthalmic solution Place 1 drop into both eyes at bedtime.    03/01/2017 at Unknown time  . lidocaine (LIDODERM) 5 % Place 1 patch onto the skin daily. Remove & Discard patch within 12 hours or as directed by MD 30 patch 6 03/04/2017 at Unknown time  . LORazepam (ATIVAN) 0.5 MG tablet Take 1 tablet (0.5 mg total) by mouth every 8 (eight) hours as needed for anxiety. 60 tablet 0 03/12/2017 at Unknown time  . Misc Natural Products (FIBER 7) POWD Take by mouth daily.   Taking  . Multiple Vitamin (MULTIVITAMIN) capsule Take 1 capsule by mouth every morning.    03/19/2017 at Unknown time  . Omega-3 Fatty Acids (FISH OIL CONCENTRATE) 1000 MG CAPS Take 2 capsules by mouth 2 (two) times daily.     03/09/2017 at Unknown time  . potassium chloride SA (K-DUR,KLOR-CON) 20 MEQ tablet Take 1 tablet (20 mEq total) by mouth daily. 30 tablet 6 03/16/2017 at Unknown time  . sertraline (ZOLOFT) 100 MG tablet Take 1 tablet (100 mg total) by mouth daily. 90 tablet 1 03/19/2017 at Unknown time  . vitamin B-12 (CYANOCOBALAMIN) 500 MCG tablet Take 500 mcg by mouth daily.   03/15/2017 at Unknown time  . warfarin (COUMADIN) 2.5 MG tablet Take 1 tablet daily except 1 1/2 tablets on Mondays and Thursdays  (Patient taking differently: Take 3.75 mg by mouth every evening. 2 tablets mon,tue, wed,fri,and 1/2 tablet on tue,thur, sat sun) 180 tablet 3 03/12/2017 at 800  . levothyroxine (SYNTHROID, LEVOTHROID) 50 MCG tablet TAKE 1 TABLET DAILY 90 tablet 3 Taking   Scheduled: . albuterol  2.5 mg Nebulization TID  . calcium-vitamin D  1 tablet Oral BID  . docusate sodium  100 mg Oral BID  .  donepezil  5 mg Oral QHS  . felodipine  2.5 mg Oral Daily  . fluticasone furoate-vilanterol  1 puff Inhalation Daily  . latanoprost  1 drop Both Eyes QHS  . levothyroxine  50 mcg Oral QAC breakfast  . mouth rinse  15 mL Mouth Rinse BID  . pantoprazole  40 mg Oral Daily  . sertraline  100 mg Oral Daily  . Warfarin - Pharmacist Dosing Inpatient   Does not apply Q24H   Continuous: . piperacillin-tazobactam (ZOSYN)  IV 3.375 g (03/06/17 0634)  . vancomycin 750 mg (03/05/17 1528)   ZLD:[id number] **OR** acetaminophen, ketorolac, ondansetron **OR** ondansetron (ZOFRAN) IV, sodium chloride, traMADol  Assesment: She has healthcare associated pneumonia which may be related to her fall and splinting of the left side. She has acute on chronic hypoxic respiratory failure had increasing oxygen requirements and is now in stepdown. She is chronically anticoagulated. She has chronic diastolic heart failure which seems to be okay. She is still in atrial fib. Principal Problem:   Acute on chronic respiratory failure with hypoxia (HCC) Active Problems:   Hypothyroidism   Compression fracture of vertebrae (HCC)   Chronic anticoagulation   Closed traumatic displaced fracture of one rib of left side   HCAP (healthcare-associated pneumonia)    Plan: Continue current treatments. Addition of moderate dose steroids may help with the inflammatory response    LOS: 2 days   Yu Peggs L 03/06/2017, 7:54 AM

## 2017-03-06 NOTE — Progress Notes (Signed)
ANTICOAGULATION CONSULT NOTE - follow up  Pharmacy Consult for Warfarin Indication: atrial fibrillation  Allergies  Allergen Reactions  . Propoxyphene N-Acetaminophen Shortness Of Breath  . Anoro Ellipta [Umeclidinium-Vilanterol] Other (See Comments)    Blurry Vision   . Codeine Nausea And Vomiting  . Meperidine Hcl Nausea And Vomiting  . Morphine Nausea And Vomiting  . Shellfish Allergy Hives    Patient Measurements: Height: 5\' 2"  (157.5 cm) Weight: 130 lb 15.3 oz (59.4 kg) IBW/kg (Calculated) : 50.1  Vital Signs: Temp: 97.8 F (36.6 C) (08/18 0730) Temp Source: Oral (08/18 0730) BP: 115/71 (08/18 0600) Pulse Rate: 84 (08/18 0730)  Labs:  Recent Labs  03/04/17 0529 03/05/17 0741 03/06/17 0601  HGB  --  12.1 12.4  HCT  --  36.7 36.8  PLT  --  272 276  LABPROT 38.6* 30.6* 26.9*  INR 3.82 2.86 2.43  CREATININE  --  0.70 0.67    Estimated Creatinine Clearance: 37.7 mL/min (by C-G formula based on SCr of 0.67 mg/dL).   Medical History: Past Medical History:  Diagnosis Date  . Anemia   . Anxiety   . Aortic sclerosis   . Arthritis   . CHF (congestive heart failure) ([place])   . Chronic atrial fibrillation ([place])   . Chronic gastritis 10/2009   Duodenitis on EGD  . CKD (chronic kidney disease) stage 2, GFR 60-89 ml/min   . Collagen vascular disease ([place])   . COPD (chronic obstructive pulmonary disease) ([place])   . Depression   . Diabetes type 2, controlled ([place])    Diet controlled  . Diverticulosis   . Dry ARMD    Retinal hemorrhage (09/2013) [place]  . Essential hypertension, benign   . GERD (gastroesophageal reflux disease)   . Glaucoma    Dr. Venetia Maxon  . Hiatal hernia 2011   Small  . History of pelvic fracture April 2013  . History of rheumatic fever   . History of skin cancer   . History of TIA (transient ischemic attack)   . Hypothyroidism   . Macular degeneration   . Mixed hyperlipidemia   . Osteoporosis 04/2013   Thoracic compression fracture, on  bisphosphonate and cal/vit D  . Oxygen deficiency   . Pulmonary nodules    Stable bilateral on CT 01/2010, rec rpt yearly for 2 yrs, pt decided to stop f/u  . Seasonal allergies   . Vertebral compression fracture ([place]) 05/2016   Vertebroplasty L2/L3    Medications:  Prescriptions Prior to Admission  Medication Sig Dispense Refill Last Dose  . acetaminophen (TYLENOL) 500 MG tablet Take 2 tablets (1,000 mg total) by mouth 3 (three) times daily. (Patient taking differently: Take 1,000 mg by mouth every 8 (eight) hours as needed for mild pain. )   Taking  . Blood Glucose Monitoring Suppl (FREESTYLE LITE) DEVI Check blood sugar once daily and as directed. Dx E11.9 90 each 1 Taking  . Calcium-Magnesium-Vitamin D 315-176-160 MG-MG-UNIT TABS Take 1 tablet by mouth 2 (two) times daily.   03/09/2017 at Unknown time  . donepezil (ARICEPT) 5 MG tablet TAKE 1 TABLET(5 MG) BY MOUTH AT BEDTIME 30 tablet 6 03/01/2017 at Unknown time  . felodipine (PLENDIL) 2.5 MG 24 hr tablet TAKE 1 TABLET DAILY 90 tablet 1 02/18/2017 at Unknown time  . ferrous sulfate 325 (65 FE) MG tablet Take 325 mg by mouth daily with breakfast.   03/07/2017 at Unknown time  . fluticasone furoate-vilanterol (BREO ELLIPTA) 100-25 MCG/INH AEPB Inhale 1 puff into the  lungs daily. 1 each 6 03/01/2017 at Unknown time  . furosemide (LASIX) 40 MG tablet Take 80mg  in the morning, 40mg  in afternoon 90 tablet 6 02/26/2017 at Unknown time  . glucose blood (FREESTYLE LITE) test strip Check blood sugar once daily and as directed. Dx E11.9 100 each 1 Taking  . Lancets (FREESTYLE) lancets Check blood sugar once daily and as directed. Dx E11.9 100 each 1 Taking  . latanoprost (XALATAN) 0.005 % ophthalmic solution Place 1 drop into both eyes at bedtime.    03/01/2017 at Unknown time  . lidocaine (LIDODERM) 5 % Place 1 patch onto the skin daily. Remove & Discard patch within 12 hours or as directed by MD 30 patch 6 02/21/2017 at Unknown time  . LORazepam (ATIVAN)  0.5 MG tablet Take 1 tablet (0.5 mg total) by mouth every 8 (eight) hours as needed for anxiety. 60 tablet 0 02/23/2017 at Unknown time  . Misc Natural Products (FIBER 7) POWD Take by mouth daily.   Taking  . Multiple Vitamin (MULTIVITAMIN) capsule Take 1 capsule by mouth every morning.    02/19/2017 at Unknown time  . Omega-3 Fatty Acids (FISH OIL CONCENTRATE) 1000 MG CAPS Take 2 capsules by mouth 2 (two) times daily.     03/09/2017 at Unknown time  . potassium chloride SA (K-DUR,KLOR-CON) 20 MEQ tablet Take 1 tablet (20 mEq total) by mouth daily. 30 tablet 6 03/07/2017 at Unknown time  . sertraline (ZOLOFT) 100 MG tablet Take 1 tablet (100 mg total) by mouth daily. 90 tablet 1 03/16/2017 at Unknown time  . vitamin B-12 (CYANOCOBALAMIN) 500 MCG tablet Take 500 mcg by mouth daily.   03/19/2017 at Unknown time  . warfarin (COUMADIN) 2.5 MG tablet Take 1 tablet daily except 1 1/2 tablets on Mondays and Thursdays (Patient taking differently: Take 3.75 mg by mouth every evening. 2 tablets mon,tue, wed,fri,and 1/2 tablet on tue,thur, sat sun) 180 tablet 3 02/20/2017 at 800  . levothyroxine (SYNTHROID, LEVOTHROID) 50 MCG tablet TAKE 1 TABLET DAILY 90 tablet 3 Taking   Assessment: Okay for Protocol, Recent fall will rib pain. INR 2.43 this AM  Goal of Therapy:  INR 2-3   Plan:  Warfarin 3 mg PO x 1 Daily PT/INR Monitor for signs and symptoms of bleeding.   Gildardo Griffes [place], [place] 03/06/2017,9:06 AM

## 2017-03-06 NOTE — Progress Notes (Signed)
PROGRESS NOTE    [name]  HYQ:[id number] DOB: 12/06/1925 DOA: 03/03/2017 PCP: Ria Bush, MD    Brief Narrative:   83 y.o. female with medical history significant of compression fractures s/p vertebroplasty; HLD; macular degeneration; hypothyroidism; glaucoma; GERD; HTN; DM; COPD on 2L home O2; CKD; afib on Coumadin; and diastolic heart failure presenting with SOB after a fall was found to have displaced left fifth rib fracture.  Assessment & Plan:     Left lower lobe collapse 2/2 multiple rib fractures from the 4th ribs to the 8th .   Acute on chronic respiratory failure with hypoxia (HCC)   Hypothyroidism   Compression fracture of vertebrae (HCC)   Chronic anticoagulation   Chronic A FIB      Patient is stable hemodynamically but requires high flow O2 , CT scan is not supportive of any Pneumonia , I will stop the Abx , continue incentive spirometry and pain control , follow up with pulmonary .  DVT prophylaxis: anticoagulated. Code Status: DNR. Family Communication: (son). Disposition Plan: (PT/OT EVAL   Consultants:   NONE   Procedures: NONE   Antimicrobials: (specify start and planned stop date. Auto populated tables are space occupying and do not give end dates) ZOSYN/VANC   Subjective:   States she feels ok , still with pain and shortness of breath , in the ICU    Objective: Vitals:   03/06/17 0522 03/06/17 0600 03/06/17 0730 03/06/17 0805  BP:  115/71    Pulse:  88 84   Resp:  19 19   Temp: 98.4 F (36.9 C)  97.8 F (36.6 C)   TempSrc: Oral  Oral   SpO2:  94% 95% 96%  Weight: 59.4 kg (130 lb 15.3 oz)     Height:        Intake/Output Summary (Last 24 hours) at 03/06/17 0939 Last data filed at 03/06/17 0901  Gross per 24 hour  Intake              460 ml  Output              100 ml  Net              360 ml   Filed Weights   03/07/2017 1335 03/05/17 1658 03/06/17 0522  Weight: 56.7 kg (125 lb) 60 kg (132 lb 4.4 oz) 59.4 kg (130 lb  15.3 oz)    Examination:  General exam: Appears calm and comfortable  Respiratory system:Lungs were clear to auscultation AT THE UPPER PARTS, left lower lobe auscultation showed decreased air entry . Cardiovascular system: S1 & S2 heard, RRR. No JVD, murmurs, rubs, gallops or clicks. No pedal edema. Gastrointestinal system: Abdomen is nondistended, soft and nontender. No organomegaly or masses felt. Normal bowel sounds heard. Central nervous system: Alert and oriented. No focal neurological deficits. Extremities: Symmetric 5 x 5 power. Skin: No rashes, lesions or ulcers Psychiatry: Judgement and insight appear normal. Mood & affect appropriate.     Data Reviewed: I have personally reviewed following labs and imaging studies  CBC:  Recent Labs Lab 03/19/2017 1410 03/03/17 0622 03/05/17 0741 03/06/17 0601  WBC 11.0* 8.3 8.1 9.8  NEUTROABS 8.3*  --   --   --   HGB 12.4 11.1* 12.1 12.4  HCT 37.2 33.6* 36.7 36.8  MCV 100.8* 102.8* 101.7* 102.5*  PLT 250 232 272 952   Basic Metabolic Panel:  Recent Labs Lab 02/17/2017 1410 03/03/17 0622 03/05/17 0741 03/06/17 0601  NA  133* 139 135 138  K 4.0 4.0 4.1 3.8  CL 102 106 102 105  CO2 23 26 26 26   GLUCOSE 147* 92 122* 116*  BUN 20 18 21* 17  CREATININE 0.73 0.66 0.70 0.67  CALCIUM 8.9 8.2* 8.4* 8.4*   GFR: Estimated Creatinine Clearance: 37.7 mL/min (by C-G formula based on SCr of 0.67 mg/dL). Liver Function Tests:  Recent Labs Lab 03/05/17 0741 03/06/17 0601  AST 21 22  ALT 22 21  ALKPHOS 85 85  BILITOT 1.2 1.3*  PROT 6.5 6.5  ALBUMIN 3.0* 3.1*   No results for input(s): LIPASE, AMYLASE in the last 168 hours. No results for input(s): AMMONIA in the last 168 hours. Coagulation Profile:  Recent Labs Lab 02/20/2017 1410 03/03/17 0622 03/04/17 0529 03/05/17 0741 03/06/17 0601  INR 2.97 3.85 3.82 2.86 2.43   Cardiac Enzymes: No results for input(s): CKTOTAL, CKMB, CKMBINDEX, TROPONINI in the last 168  hours. BNP (last 3 results)  Recent Labs  01/28/17 1249  PROBNP 742.0*   HbA1C: No results for input(s): HGBA1C in the last 72 hours. CBG: No results for input(s): GLUCAP in the last 168 hours. Lipid Profile: No results for input(s): CHOL, HDL, LDLCALC, TRIG, CHOLHDL, LDLDIRECT in the last 72 hours. Thyroid Function Tests: No results for input(s): TSH, T4TOTAL, FREET4, T3FREE, THYROIDAB in the last 72 hours. Anemia Panel: No results for input(s): VITAMINB12, FOLATE, FERRITIN, TIBC, IRON, RETICCTPCT in the last 72 hours. Urine analysis:    Component Value Date/Time   COLORURINE YELLOW 07/26/2016 2219   APPEARANCEUR HAZY (A) 07/26/2016 2219   LABSPEC 1.004 (L) 07/26/2016 2219   PHURINE 8.0 07/26/2016 2219   GLUCOSEU NEGATIVE 07/26/2016 2219   HGBUR NEGATIVE 07/26/2016 2219   BILIRUBINUR neg 11/03/2016 1324   KETONESUR NEGATIVE 07/26/2016 2219   PROTEINUR neg 11/03/2016 1324   PROTEINUR NEGATIVE 07/26/2016 2219   UROBILINOGEN negative (A) 11/03/2016 1324   UROBILINOGEN 0.2 12/25/2013 2250   NITRITE neg 11/03/2016 1324   NITRITE NEGATIVE 07/26/2016 2219   LEUKOCYTESUR Small (1+) (A) 11/03/2016 1324   Sepsis Labs: @LABRCNTIP (procalcitonin:4,lacticidven:4)  ) Recent Results (from the past 240 hour(s))  MRSA PCR Screening     Status: None   Collection Time: 03/06/17  6:30 AM  Result Value Ref Range Status   MRSA by PCR NEGATIVE NEGATIVE Final    Comment:        The GeneXpert MRSA Assay (FDA approved for NASAL specimens only), is one component of a comprehensive MRSA colonization surveillance program. It is not intended to diagnose MRSA infection nor to guide or monitor treatment for MRSA infections.          Radiology Studies: Dg Chest 1 View  Result Date: 03/04/2017 CLINICAL DATA:  Shortness of Breath EXAM: CHEST 1 VIEW COMPARISON:  March 02, 2017 FINDINGS: There is extensive airspace consolidation throughout the left mid lower lung zones with left pleural  effusion. There is a small right pleural effusion with slight atelectasis in the right base. There is cardiomegaly with pulmonary venous hypertension. No adenopathy. Aorta is tortuous with atherosclerotic calcification in the aorta. No evident adenopathy. Recently noted left fifth rib fracture is not appreciable on this examination. No pneumothorax. IMPRESSION: Airspace consolidation, likely pneumonia, throughout the left mid and lower lung zones. There is a moderate left effusion with a rather minimal right pleural effusion. There is pulmonary vascular congestion. Cardiomegaly is stable. Aorta is somewhat tortuous with aortic atherosclerosis. Aortic Atherosclerosis (ICD10-I70.0). Electronically Signed   By: Lowella Grip  III M.D.   On: 03/04/2017 12:45   Ct Chest W Contrast  Result Date: 03/05/2017 CLINICAL DATA:  Inpatient.  Pneumonia.  Dyspnea.  CHF. EXAM: CT CHEST WITH CONTRAST TECHNIQUE: Multidetector CT imaging of the chest was performed during intravenous contrast administration. CONTRAST:  43mL ISOVUE-300 IOPAMIDOL (ISOVUE-300) INJECTION 61% COMPARISON:  Chest radiograph from one day prior. 09/14/2016 chest CT. FINDINGS: Cardiovascular: Mild-to-moderate cardiomegaly, increased since 09/14/2016 . Trace pericardial effusion/thickening, not appreciably changed. Left main, left anterior descending, left circumflex and right coronary atherosclerosis. Atherosclerotic nonaneurysmal thoracic aorta. Stable top-normal caliber main pulmonary artery (3.0 cm diameter). No central pulmonary emboli. Mediastinum/Nodes: No discrete thyroid nodules. Unremarkable esophagus. No axillary adenopathy. Mild right paratracheal adenopathy measuring up to 1.1 cm (series 2/ image 59), not appreciably changed since 09/14/2016 using similar measurement technique. Stable enlarged 1.4 cm subcarinal node (series 2/ image 64). Mild bilateral hilar adenopathy measuring up to 1.3 cm on the right (series 2/ image 66), subjectively  stable since the prior noncontrast chest CT. Lungs/Pleura: No pneumothorax. Small dependent right pleural effusion. Moderate dependent left pleural effusion. Moderate centrilobular emphysema. Mild compressive atelectasis in the dependent right lower and right upper lobes. Complete left lower lobe atelectasis. Moderate dependent lingular compressive atelectasis. No evidence of central airway stenosis. No discrete lung masses or significant pulmonary nodules in the aerated portions of the lungs. Interlobular septal thickening throughout both lungs. Upper abdomen: Contrast reflux into the IVC and hepatic veins. Exophytic simple 1.4 cm medial upper left renal cyst. Musculoskeletal: No aggressive appearing focal osseous lesions. Acute minimally displaced posterior left seventh and eighth rib fractures. Acute nondisplaced lateral left fourth, fifth, sixth and seventh rib fractures. Moderate T11 vertebral compression fracture is new since 09/14/2016. Chronic mild T7, mild T8, mild T10, mild to moderate T12 and superior L1, L2 and L3 vertebral compression fractures are stable, noting stable vertebroplasty material in the L2 and L3 vertebral bodies. Moderate thoracic spondylosis. IMPRESSION: 1. Spectrum of findings compatible with congestive heart failure, including cardiomegaly, diffuse interlobular septal thickening indicating mild pulmonary edema and small right and moderate left dependent pleural effusions. 2. Complete left lower lobe atelectasis. Mild-to-moderate compressive atelectasis in the dependent right upper and right lower lobes and lingula. 3. Stable mild mediastinal and bilateral hilar lymphadenopathy, compatible with reactive adenopathy. 4. Acute left fourth through eighth rib fractures. Moderate T11 vertebral compression fracture is new since 09/14/2016. 5. Left main and 3 vessel coronary atherosclerosis. Aortic Atherosclerosis (ICD10-I70.0) and Emphysema (ICD10-J43.9). Electronically Signed   By: Ilona Sorrel M.D.   On: 03/05/2017 17:29        Scheduled Meds: . albuterol  2.5 mg Nebulization TID  . calcium-vitamin D  1 tablet Oral BID  . docusate sodium  100 mg Oral BID  . donepezil  5 mg Oral QHS  . felodipine  2.5 mg Oral Daily  . fluticasone furoate-vilanterol  1 puff Inhalation Daily  . furosemide  20 mg Intravenous Once  . latanoprost  1 drop Both Eyes QHS  . levothyroxine  50 mcg Oral QAC breakfast  . mouth rinse  15 mL Mouth Rinse BID  . methylPREDNISolone (SOLU-MEDROL) injection  40 mg Intravenous Q12H  . pantoprazole  40 mg Oral Daily  . sertraline  100 mg Oral Daily  . warfarin  3 mg Oral Once  . Warfarin - Pharmacist Dosing Inpatient   Does not apply Q24H   Continuous Infusions:    LOS: 2 days    Time spent: [place]  Burnis Medin, MD [facility]   If 7PM-7AM, please contact night-coverage [email or web address] Password [facility] 03/06/2017, 9:39 AM

## 2017-03-07 ENCOUNTER — Inpatient Hospital Stay (HOSPITAL_COMMUNITY): Payer: Medicare Other

## 2017-03-07 LAB — BASIC METABOLIC PANEL
Anion gap: 10 (ref 5–15)
BUN: 26 mg/dL — AB (ref 6–20)
CO2: 27 mmol/L (ref 22–32)
CREATININE: 0.7 mg/dL (ref 0.44–1.00)
Calcium: 8.8 mg/dL — ABNORMAL LOW (ref 8.9–10.3)
Chloride: 103 mmol/L (ref 101–111)
Glucose, Bld: 148 mg/dL — ABNORMAL HIGH (ref 65–99)
POTASSIUM: 4.1 mmol/L (ref 3.5–5.1)
SODIUM: 140 mmol/L (ref 135–145)

## 2017-03-07 LAB — PROTIME-INR
INR: 2.56
Prothrombin Time: 28 seconds — ABNORMAL HIGH (ref 11.4–15.2)

## 2017-03-07 LAB — BLOOD GAS, ARTERIAL
ACID-BASE EXCESS: 1.8 mmol/L (ref 0.0–2.0)
Bicarbonate: 25.6 mmol/L (ref 20.0–28.0)
Drawn by: 221791
O2 CONTENT: 15 L/min
O2 SAT: 87.6 %
PATIENT TEMPERATURE: 37
PCO2 ART: 42.4 mmHg (ref 32.0–48.0)
PO2 ART: 59.4 mmHg — AB (ref 83.0–108.0)
pH, Arterial: 7.405 (ref 7.350–7.450)

## 2017-03-07 MED ORDER — PIPERACILLIN-TAZOBACTAM 3.375 G IVPB
3.3750 g | Freq: Three times a day (TID) | INTRAVENOUS | Status: DC
  Administered 2017-03-07 – 2017-03-08 (×3): 3.375 g via INTRAVENOUS
  Filled 2017-03-07 (×3): qty 50

## 2017-03-07 MED ORDER — PIPERACILLIN-TAZOBACTAM IN DEX 2-0.25 GM/50ML IV SOLN
2.2500 g | Freq: Four times a day (QID) | INTRAVENOUS | Status: DC
  Filled 2017-03-07 (×4): qty 50

## 2017-03-07 MED ORDER — FUROSEMIDE 10 MG/ML IJ SOLN
40.0000 mg | Freq: Once | INTRAMUSCULAR | Status: AC
  Administered 2017-03-07: 40 mg via INTRAVENOUS
  Filled 2017-03-07: qty 4

## 2017-03-07 MED ORDER — MORPHINE SULFATE (PF) 2 MG/ML IV SOLN
1.0000 mg | INTRAVENOUS | Status: DC | PRN
  Administered 2017-03-07: 1 mg via INTRAVENOUS
  Filled 2017-03-07: qty 1

## 2017-03-07 MED ORDER — WARFARIN SODIUM 2 MG PO TABS: 2.0000 mg | ORAL_TABLET | Freq: Once | ORAL | Status: DC

## 2017-03-07 MED ORDER — MORPHINE BOLUS VIA INFUSION
2.0000 mg | INTRAVENOUS | Status: DC | PRN
  Filled 2017-03-07: qty 2

## 2017-03-07 MED ORDER — MORPHINE SULFATE (PF) 2 MG/ML IV SOLN
1.0000 mg | INTRAVENOUS | Status: DC | PRN
  Administered 2017-03-07 – 2017-03-08 (×3): 1 mg via INTRAVENOUS
  Filled 2017-03-07 (×3): qty 1

## 2017-03-07 MED ORDER — LORAZEPAM 2 MG/ML IJ SOLN
0.5000 mg | INTRAMUSCULAR | Status: DC | PRN
  Administered 2017-03-07 – 2017-03-08 (×4): 0.5 mg via INTRAVENOUS
  Filled 2017-03-07 (×3): qty 1

## 2017-03-07 MED ORDER — LORAZEPAM 2 MG/ML IJ SOLN
INTRAMUSCULAR | Status: AC
  Administered 2017-03-07: 0.5 mg via INTRAVENOUS
  Filled 2017-03-07: qty 1

## 2017-03-07 MED ORDER — GUAIFENESIN ER 600 MG PO TB12
1200.0000 mg | ORAL_TABLET | Freq: Two times a day (BID) | ORAL | Status: DC
  Administered 2017-03-07: 1200 mg via ORAL
  Filled 2017-03-07: qty 2

## 2017-03-07 MED ORDER — ACETYLCYSTEINE 20 % IN SOLN
3.0000 mL | RESPIRATORY_TRACT | Status: DC
  Administered 2017-03-07 – 2017-03-08 (×7): 3 mL via RESPIRATORY_TRACT
  Filled 2017-03-07 (×7): qty 4

## 2017-03-07 MED ORDER — ALBUTEROL SULFATE (2.5 MG/3ML) 0.083% IN NEBU
2.5000 mg | INHALATION_SOLUTION | RESPIRATORY_TRACT | Status: DC
  Administered 2017-03-07 – 2017-03-08 (×7): 2.5 mg via RESPIRATORY_TRACT
  Filled 2017-03-07 (×7): qty 3

## 2017-03-07 NOTE — Progress Notes (Signed)
The patient initially wore the NRB but pulled that off as well. The RT was able to place the High flow [place] back on the patient, but she only maintained an SpO2 sat around 75%, after about 10min of very labored breathing, she did allow me to place the NRB back on. At this point she is confused from the lack of O2 and it trying to climb out of the bed. She's only maintaining an SpO2 of about 60% at this time. Dr. Burnis Medin was notified. Will remain in the room with the patient.

## 2017-03-07 NOTE — Progress Notes (Signed)
Patient placed on BiPAP 12/6 R10 100% and breathing treatment started. Patient wore BiPAP for approximately thirteen minutes and removed it from her face stating " I can't breath and I refuse to die with this mask on ."  The doctor was notified, patient returned to NRB.  I asked of she would take her breathing treatment and the patient declined.

## 2017-03-07 NOTE — Progress Notes (Signed)
Spoke with Dr. Burnis Medin at this time. He wants me to inform the family that she may not have much time remaining before she passes.

## 2017-03-07 NOTE — Progress Notes (Signed)
The patient's son and husband are here, the doctor was notified at this time.

## 2017-03-07 NOTE — Progress Notes (Signed)
No real changes to the patient's condition. Morphine 1mg  IV was given but there was little change noted. Called the physician and informed him, and mentioned the idea of starting a morphine drip. Informed him that the husband and son are supposed to be here no later than 6pm, and he said he would be here to talk to them when they arrive.

## 2017-03-07 NOTE — Plan of Care (Signed)
Problem: Safety: Goal: Ability to remain free from injury will improve Outcome: Progressing Bed in low position, side rails uo, call bell and personal items within reach

## 2017-03-07 NOTE — Progress Notes (Signed)
In with the patient at this time attempting the patient's morning meds. She attempted to take a few but had a hard time clearing the liquid from her throat. She would try to swallow and then would just gurgle. She eventually got strangled and regurgitated all of the liquid back into an emesis bag. Immediately after she had a hard time clearing her throat, HR increased to the 120s, SpO2 dipped into the 70's and stayed there for about 3 min. This nurse coaxed her into coughing and deep breathing and she came up to about the mid 80s%. Dr. Burnis Medin was notified. Will continue to stay with patient and monitor condition.

## 2017-03-07 NOTE — Progress Notes (Signed)
Subjective: She says she feels okay. She's a little confused. Discussed with hospitalist attending. Reviewed CT together and I agree looks like more of a left lower lobe collapse with some effusion as well. This is all likely related to splinting of that side from her multiple rib fractures.  Objective: Vital signs in last 24 hours: Temp:  [97.7 F (36.5 C)-98.2 F (36.8 C)] 98.1 F (36.7 C) (08/19 0716) Pulse Rate:  [84-108] 107 (08/19 0716) Resp:  [13-25] 17 (08/19 0716) BP: (103-151)/(62-119) 136/95 (08/19 0600) SpO2:  [85 %-97 %] 96 % (08/19 0716) Weight:  [58.1 kg (128 lb 1.4 oz)] 58.1 kg (128 lb 1.4 oz) (08/19 0448) Weight change: -1.9 kg (-4 lb 3 oz) Last BM Date: 03/04/17  Intake/Output from previous day: 08/18 0701 - 08/19 0700 In: 145 [P.O.:145] Out: 1350 [Urine:1350]  PHYSICAL EXAM General appearance: alert, cooperative, mild distress and Mildly confused Resp: rhonchi bilaterally Cardio: irregularly irregular rhythm GI: soft, non-tender; bowel sounds normal; no masses,  no organomegaly Extremities: extremities normal, atraumatic, no cyanosis or edema Skin warm and dry  Lab Results:  Results for orders placed or performed during the hospital encounter of 02/17/2017 (from the past 48 hour(s))  Blood gas, arterial     Status: Abnormal   Collection Time: 03/05/17 12:00 PM  Result Value Ref Range   O2 Content 10.0 L/min   Delivery systems NASAL CANNULA    pH, Arterial 7.450 7.350 - 7.450   pCO2 arterial 33.2 32.0 - 48.0 mmHg   pO2, Arterial 61.5 (L) 83.0 - 108.0 mmHg   Bicarbonate 24.1 20.0 - 28.0 mmol/L   Acid-base deficit 0.7 0.0 - 2.0 mmol/L   O2 Saturation 90.4 %   Patient temperature 37.0    Collection site LEFT RADIAL    Drawn by [id number]    Sample type ARTERIAL DRAW    Allens test (pass/fail) PASS PASS  Protime-INR     Status: Abnormal   Collection Time: 03/06/17  6:01 AM  Result Value Ref Range   Prothrombin Time 26.9 (H) 11.4 - 15.2 seconds   INR 2.43    CBC     Status: Abnormal   Collection Time: 03/06/17  6:01 AM  Result Value Ref Range   WBC 9.8 4.0 - 10.5 K/uL   RBC 3.59 (L) 3.87 - 5.11 MIL/uL   Hemoglobin 12.4 12.0 - 15.0 g/dL   HCT 36.8 36.0 - 46.0 %   MCV 102.5 (H) 78.0 - 100.0 fL   MCH 34.5 (H) 26.0 - 34.0 pg   MCHC 33.7 30.0 - 36.0 g/dL   RDW 15.3 11.5 - 15.5 %   Platelets 276 150 - 400 K/uL  Comprehensive metabolic panel     Status: Abnormal   Collection Time: 03/06/17  6:01 AM  Result Value Ref Range   Sodium 138 135 - 145 mmol/L   Potassium 3.8 3.5 - 5.1 mmol/L   Chloride 105 101 - 111 mmol/L   CO2 26 22 - 32 mmol/L   Glucose, Bld 116 (H) 65 - 99 mg/dL   BUN 17 6 - 20 mg/dL   Creatinine, Ser 0.67 0.44 - 1.00 mg/dL   Calcium 8.4 (L) 8.9 - 10.3 mg/dL   Total Protein 6.5 6.5 - 8.1 g/dL   Albumin 3.1 (L) 3.5 - 5.0 g/dL   AST 22 15 - 41 U/L   ALT 21 14 - 54 U/L   Alkaline Phosphatase 85 38 - 126 U/L   Total Bilirubin 1.3 (H) 0.3 - 1.2  mg/dL   GFR calc non Af Amer >60 >60 mL/min   GFR calc Af Amer >60 >60 mL/min    Comment: (NOTE) The eGFR has been calculated using the CKD EPI equation. This calculation has not been validated in all clinical situations. eGFR's persistently <60 mL/min signify possible Chronic Kidney Disease.    Anion gap 7 5 - 15  MRSA PCR Screening     Status: None   Collection Time: 03/06/17  6:30 AM  Result Value Ref Range   MRSA by PCR NEGATIVE NEGATIVE    Comment:        The GeneXpert MRSA Assay (FDA approved for NASAL specimens only), is one component of a comprehensive MRSA colonization surveillance program. It is not intended to diagnose MRSA infection nor to guide or monitor treatment for MRSA infections.   Protime-INR     Status: Abnormal   Collection Time: 03/07/17  5:22 AM  Result Value Ref Range   Prothrombin Time 28.0 (H) 11.4 - 15.2 seconds   INR 4.16   Basic metabolic panel     Status: Abnormal   Collection Time: 03/07/17  5:22 AM  Result Value Ref Range   Sodium 140  135 - 145 mmol/L   Potassium 4.1 3.5 - 5.1 mmol/L   Chloride 103 101 - 111 mmol/L   CO2 27 22 - 32 mmol/L   Glucose, Bld 148 (H) 65 - 99 mg/dL   BUN 26 (H) 6 - 20 mg/dL   Creatinine, Ser 0.70 0.44 - 1.00 mg/dL   Calcium 8.8 (L) 8.9 - 10.3 mg/dL   GFR calc non Af Amer >60 >60 mL/min   GFR calc Af Amer >60 >60 mL/min    Comment: (NOTE) The eGFR has been calculated using the CKD EPI equation. This calculation has not been validated in all clinical situations. eGFR's persistently <60 mL/min signify possible Chronic Kidney Disease.    Anion gap 10 5 - 15    ABGS  Recent Labs  03/05/17 1200  PHART 7.450  PO2ART 61.5*  HCO3 24.1   CULTURES Recent Results (from the past 240 hour(s))  MRSA PCR Screening     Status: None   Collection Time: 03/06/17  6:30 AM  Result Value Ref Range Status   MRSA by PCR NEGATIVE NEGATIVE Final    Comment:        The GeneXpert MRSA Assay (FDA approved for NASAL specimens only), is one component of a comprehensive MRSA colonization surveillance program. It is not intended to diagnose MRSA infection nor to guide or monitor treatment for MRSA infections.    Studies/Results: Ct Chest W Contrast  Result Date: 03/05/2017 CLINICAL DATA:  Inpatient.  Pneumonia.  Dyspnea.  CHF. EXAM: CT CHEST WITH CONTRAST TECHNIQUE: Multidetector CT imaging of the chest was performed during intravenous contrast administration. CONTRAST:  74m ISOVUE-300 IOPAMIDOL (ISOVUE-300) INJECTION 61% COMPARISON:  Chest radiograph from one day prior. 09/14/2016 chest CT. FINDINGS: Cardiovascular: Mild-to-moderate cardiomegaly, increased since 09/14/2016 . Trace pericardial effusion/thickening, not appreciably changed. Left main, left anterior descending, left circumflex and right coronary atherosclerosis. Atherosclerotic nonaneurysmal thoracic aorta. Stable top-normal caliber main pulmonary artery (3.0 cm diameter). No central pulmonary emboli. Mediastinum/Nodes: No discrete thyroid  nodules. Unremarkable esophagus. No axillary adenopathy. Mild right paratracheal adenopathy measuring up to 1.1 cm (series 2/ image 59), not appreciably changed since 09/14/2016 using similar measurement technique. Stable enlarged 1.4 cm subcarinal node (series 2/ image 64). Mild bilateral hilar adenopathy measuring up to 1.3 cm on the right (series 2/  image 66), subjectively stable since the prior noncontrast chest CT. Lungs/Pleura: No pneumothorax. Small dependent right pleural effusion. Moderate dependent left pleural effusion. Moderate centrilobular emphysema. Mild compressive atelectasis in the dependent right lower and right upper lobes. Complete left lower lobe atelectasis. Moderate dependent lingular compressive atelectasis. No evidence of central airway stenosis. No discrete lung masses or significant pulmonary nodules in the aerated portions of the lungs. Interlobular septal thickening throughout both lungs. Upper abdomen: Contrast reflux into the IVC and hepatic veins. Exophytic simple 1.4 cm medial upper left renal cyst. Musculoskeletal: No aggressive appearing focal osseous lesions. Acute minimally displaced posterior left seventh and eighth rib fractures. Acute nondisplaced lateral left fourth, fifth, sixth and seventh rib fractures. Moderate T11 vertebral compression fracture is new since 09/14/2016. Chronic mild T7, mild T8, mild T10, mild to moderate T12 and superior L1, L2 and L3 vertebral compression fractures are stable, noting stable vertebroplasty material in the L2 and L3 vertebral bodies. Moderate thoracic spondylosis. IMPRESSION: 1. Spectrum of findings compatible with congestive heart failure, including cardiomegaly, diffuse interlobular septal thickening indicating mild pulmonary edema and small right and moderate left dependent pleural effusions. 2. Complete left lower lobe atelectasis. Mild-to-moderate compressive atelectasis in the dependent right upper and right lower lobes and  lingula. 3. Stable mild mediastinal and bilateral hilar lymphadenopathy, compatible with reactive adenopathy. 4. Acute left fourth through eighth rib fractures. Moderate T11 vertebral compression fracture is new since 09/14/2016. 5. Left main and 3 vessel coronary atherosclerosis. Aortic Atherosclerosis (ICD10-I70.0) and Emphysema (ICD10-J43.9). Electronically Signed   By: Ilona Sorrel M.D.   On: 03/05/2017 17:29    Medications:  Prior to Admission:  Prescriptions Prior to Admission  Medication Sig Dispense Refill Last Dose  . acetaminophen (TYLENOL) 500 MG tablet Take 2 tablets (1,000 mg total) by mouth 3 (three) times daily. (Patient taking differently: Take 1,000 mg by mouth every 8 (eight) hours as needed for mild pain. )   Taking  . Blood Glucose Monitoring Suppl (FREESTYLE LITE) DEVI Check blood sugar once daily and as directed. Dx E11.9 90 each 1 Taking  . Calcium-Magnesium-Vitamin D [id number] MG-MG-UNIT TABS Take 1 tablet by mouth 2 (two) times daily.   03/03/2017 at Unknown time  . donepezil (ARICEPT) 5 MG tablet TAKE 1 TABLET(5 MG) BY MOUTH AT BEDTIME 30 tablet 6 03/01/2017 at Unknown time  . felodipine (PLENDIL) 2.5 MG 24 hr tablet TAKE 1 TABLET DAILY 90 tablet 1 03/01/2017 at Unknown time  . ferrous sulfate 325 (65 FE) MG tablet Take 325 mg by mouth daily with breakfast.   03/03/2017 at Unknown time  . fluticasone furoate-vilanterol (BREO ELLIPTA) 100-25 MCG/INH AEPB Inhale 1 puff into the lungs daily. 1 each 6 03/01/2017 at Unknown time  . furosemide (LASIX) 40 MG tablet Take 18m in the morning, 463min afternoon 90 tablet 6 03/16/2017 at Unknown time  . glucose blood (FREESTYLE LITE) test strip Check blood sugar once daily and as directed. Dx E11.9 100 each 1 Taking  . Lancets (FREESTYLE) lancets Check blood sugar once daily and as directed. Dx E11.9 100 each 1 Taking  . latanoprost (XALATAN) 0.005 % ophthalmic solution Place 1 drop into both eyes at bedtime.    03/01/2017 at Unknown time   . lidocaine (LIDODERM) 5 % Place 1 patch onto the skin daily. Remove & Discard patch within 12 hours or as directed by MD 30 patch 6 03/03/2017 at Unknown time  . LORazepam (ATIVAN) 0.5 MG tablet Take 1 tablet (0.5 mg total) by  mouth every 8 (eight) hours as needed for anxiety. 60 tablet 0 02/24/2017 at Unknown time  . Misc Natural Products (FIBER 7) POWD Take by mouth daily.   Taking  . Multiple Vitamin (MULTIVITAMIN) capsule Take 1 capsule by mouth every morning.    03/04/2017 at Unknown time  . Omega-3 Fatty Acids (FISH OIL CONCENTRATE) 1000 MG CAPS Take 2 capsules by mouth 2 (two) times daily.     03/19/2017 at Unknown time  . potassium chloride SA (K-DUR,KLOR-CON) 20 MEQ tablet Take 1 tablet (20 mEq total) by mouth daily. 30 tablet 6 02/25/2017 at Unknown time  . sertraline (ZOLOFT) 100 MG tablet Take 1 tablet (100 mg total) by mouth daily. 90 tablet 1 03/16/2017 at Unknown time  . vitamin B-12 (CYANOCOBALAMIN) 500 MCG tablet Take 500 mcg by mouth daily.   03/16/2017 at Unknown time  . warfarin (COUMADIN) 2.5 MG tablet Take 1 tablet daily except 1 1/2 tablets on Mondays and Thursdays (Patient taking differently: Take 3.75 mg by mouth every evening. 2 tablets mon,tue, wed,fri,and 1/2 tablet on tue,thur, sat sun) 180 tablet 3 03/15/2017 at 800  . levothyroxine (SYNTHROID, LEVOTHROID) 50 MCG tablet TAKE 1 TABLET DAILY 90 tablet 3 Taking   Scheduled: . acetylcysteine  3 mL Nebulization Q4H  . albuterol  2.5 mg Nebulization Q4H  . calcium-vitamin D  1 tablet Oral BID  . docusate sodium  100 mg Oral BID  . donepezil  5 mg Oral QHS  . felodipine  2.5 mg Oral Daily  . fluticasone furoate-vilanterol  1 puff Inhalation Daily  . guaiFENesin  1,200 mg Oral BID  . latanoprost  1 drop Both Eyes QHS  . levothyroxine  50 mcg Oral QAC breakfast  . mouth rinse  15 mL Mouth Rinse BID  . methylPREDNISolone (SOLU-MEDROL) injection  40 mg Intravenous Q12H  . pantoprazole  40 mg Oral Daily  . sertraline  100 mg  Oral Daily  . Warfarin - Pharmacist Dosing Inpatient   Does not apply Q24H   Continuous:  IPJ:[id number] **OR** acetaminophen, albuterol, ketorolac, LORazepam, ondansetron **OR** ondansetron (ZOFRAN) IV, sodium chloride, traMADol  Assesment: She has acute on chronic hypoxic respiratory failure related to rib fractures of 4 ribs on the left. She has not been taking the breast but she is working with incentive spirometry. This morning she is coughing up some sputum. She has atelectasis of the left lower lobe. Questionable effusion on that side as well. This may be blood from her rib fractures Principal Problem:   Acute on chronic respiratory failure with hypoxia (HCC) Active Problems:   Hypothyroidism   Compression fracture of vertebrae (HCC)   Chronic anticoagulation   Closed traumatic displaced fracture of one rib of left side   HCAP (healthcare-associated pneumonia)    Plan: Start Mucinex and Mucomyst increase her nebulizer treatments to every 4 hours for now continue incentive spirometry continue flutter valve to try to keep her rib pain controlled so that she can cough. If she fails conservative treatment she may need bronchoscopy    LOS: 3 days   Lutie Pickler L 03/07/2017, 7:51 AM

## 2017-03-07 NOTE — Progress Notes (Signed)
PROGRESS NOTE    [name]  BHA:[id number] DOB: February 17, 1926 DOA: 03/04/2017 PCP: Ria Bush, MD    Brief Narrative:   83 y.o. female with medical history significant of compression fractures s/p vertebroplasty; HLD; macular degeneration; hypothyroidism; glaucoma; GERD; HTN; DM; COPD on 2L home O2; CKD; afib on Coumadin; and diastolic heart failure presenting with SOB after a fall was found to have displaced left fifth rib fracture.  Assessment & Plan:     Left lower lobe collapse 2/2 multiple rib fractures from the 4th ribs to the 8th .   Acute on chronic respiratory failure with hypoxia (HCC)   Hypothyroidism   Compression fracture of vertebrae (HCC)   Chronic anticoagulation   Chronic A FIB      Patient is stable hemodynamically but requires high flow O2 , CT SHOWED LLL collapse , continue incentive spirometry and pain control , follow up with pulmonary for possible bronchoscopy , move to the chair .  DVT prophylaxis: anticoagulated. Code Status: DNR. Family Communication: (son). Disposition Plan: (PT/OT EVAL   Consultants:   NONE   Procedures: NONE   Antimicrobials: (specify start and planned stop date. Auto populated tables are space occupying and do not give end dates) ZOSYN/VANC   Subjective:  Denies any complaints or issues , states she feels ok , still on high flow o2 .   Objective: Vitals:   03/07/17 0500 03/07/17 0600 03/07/17 0716 03/07/17 0804  BP: (!) 141/119 (!) 136/95    Pulse: (!) 103 (!) 104 (!) 107   Resp: 19 16 17    Temp:   98.1 F (36.7 C)   TempSrc:   Oral   SpO2: 92% 92% 96% 98%  Weight:      Height:        Intake/Output Summary (Last 24 hours) at 03/07/17 0931 Last data filed at 03/07/17 0448  Gross per 24 hour  Intake               25 ml  Output             1350 ml  Net            -1325 ml   Filed Weights   03/05/17 1658 03/06/17 0522 03/07/17 0448  Weight: 60 kg (132 lb 4.4 oz) 59.4 kg (130 lb 15.3 oz) 58.1 kg  (128 lb 1.4 oz)    Examination:  General exam: Appears calm and comfortable  Respiratory system:Lungs were clear to auscultation AT THE UPPER PARTS, left lower lobe auscultation showed decreased air entry . Cardiovascular system: S1 & S2 heard, RRR. No JVD, murmurs, rubs, gallops or clicks. No pedal edema. Gastrointestinal system: Abdomen is nondistended, soft and nontender. No organomegaly or masses felt. Normal bowel sounds heard. Central nervous system: Alert and oriented. No focal neurological deficits. Extremities: Symmetric 5 x 5 power. Skin: No rashes, lesions or ulcers Psychiatry: Judgement and insight appear normal. Mood & affect appropriate.     Data Reviewed: I have personally reviewed following labs and imaging studies  CBC:  Recent Labs Lab 03/19/2017 1410 03/03/17 0622 03/05/17 0741 03/06/17 0601  WBC 11.0* 8.3 8.1 9.8  NEUTROABS 8.3*  --   --   --   HGB 12.4 11.1* 12.1 12.4  HCT 37.2 33.6* 36.7 36.8  MCV 100.8* 102.8* 101.7* 102.5*  PLT 250 232 272 973   Basic Metabolic Panel:  Recent Labs Lab 02/27/2017 1410 03/03/17 0622 03/05/17 0741 03/06/17 0601 03/07/17 0522  NA 133* 139 135 138  140  K 4.0 4.0 4.1 3.8 4.1  CL 102 106 102 105 103  CO2 23 26 26 26 27   GLUCOSE 147* 92 122* 116* 148*  BUN 20 18 21* 17 26*  CREATININE 0.73 0.66 0.70 0.67 0.70  CALCIUM 8.9 8.2* 8.4* 8.4* 8.8*   GFR: Estimated Creatinine Clearance: 37.7 mL/min (by C-G formula based on SCr of 0.7 mg/dL). Liver Function Tests:  Recent Labs Lab 03/05/17 0741 03/06/17 0601  AST 21 22  ALT 22 21  ALKPHOS 85 85  BILITOT 1.2 1.3*  PROT 6.5 6.5  ALBUMIN 3.0* 3.1*   No results for input(s): LIPASE, AMYLASE in the last 168 hours. No results for input(s): AMMONIA in the last 168 hours. Coagulation Profile:  Recent Labs Lab 03/03/17 0622 03/04/17 0529 03/05/17 0741 03/06/17 0601 03/07/17 0522  INR 3.85 3.82 2.86 2.43 2.56   Cardiac Enzymes: No results for input(s):  CKTOTAL, CKMB, CKMBINDEX, TROPONINI in the last 168 hours. BNP (last 3 results)  Recent Labs  01/28/17 1249  PROBNP 742.0*   HbA1C: No results for input(s): HGBA1C in the last 72 hours. CBG: No results for input(s): GLUCAP in the last 168 hours. Lipid Profile: No results for input(s): CHOL, HDL, LDLCALC, TRIG, CHOLHDL, LDLDIRECT in the last 72 hours. Thyroid Function Tests: No results for input(s): TSH, T4TOTAL, FREET4, T3FREE, THYROIDAB in the last 72 hours. Anemia Panel: No results for input(s): VITAMINB12, FOLATE, FERRITIN, TIBC, IRON, RETICCTPCT in the last 72 hours. Urine analysis:    Component Value Date/Time   COLORURINE YELLOW 07/26/2016 2219   APPEARANCEUR HAZY (A) 07/26/2016 2219   LABSPEC 1.004 (L) 07/26/2016 2219   PHURINE 8.0 07/26/2016 2219   GLUCOSEU NEGATIVE 07/26/2016 2219   HGBUR NEGATIVE 07/26/2016 2219   BILIRUBINUR neg 11/03/2016 1324   KETONESUR NEGATIVE 07/26/2016 2219   PROTEINUR neg 11/03/2016 1324   PROTEINUR NEGATIVE 07/26/2016 2219   UROBILINOGEN negative (A) 11/03/2016 1324   UROBILINOGEN 0.2 12/25/2013 2250   NITRITE neg 11/03/2016 1324   NITRITE NEGATIVE 07/26/2016 2219   LEUKOCYTESUR Small (1+) (A) 11/03/2016 1324   Sepsis Labs: @LABRCNTIP (procalcitonin:4,lacticidven:4)  ) Recent Results (from the past 240 hour(s))  MRSA PCR Screening     Status: None   Collection Time: 03/06/17  6:30 AM  Result Value Ref Range Status   MRSA by PCR NEGATIVE NEGATIVE Final    Comment:        The GeneXpert MRSA Assay (FDA approved for NASAL specimens only), is one component of a comprehensive MRSA colonization surveillance program. It is not intended to diagnose MRSA infection nor to guide or monitor treatment for MRSA infections.          Radiology Studies: Ct Chest W Contrast  Result Date: 03/05/2017 CLINICAL DATA:  Inpatient.  Pneumonia.  Dyspnea.  CHF. EXAM: CT CHEST WITH CONTRAST TECHNIQUE: Multidetector CT imaging of the chest was  performed during intravenous contrast administration. CONTRAST:  60mL ISOVUE-300 IOPAMIDOL (ISOVUE-300) INJECTION 61% COMPARISON:  Chest radiograph from one day prior. 09/14/2016 chest CT. FINDINGS: Cardiovascular: Mild-to-moderate cardiomegaly, increased since 09/14/2016 . Trace pericardial effusion/thickening, not appreciably changed. Left main, left anterior descending, left circumflex and right coronary atherosclerosis. Atherosclerotic nonaneurysmal thoracic aorta. Stable top-normal caliber main pulmonary artery (3.0 cm diameter). No central pulmonary emboli. Mediastinum/Nodes: No discrete thyroid nodules. Unremarkable esophagus. No axillary adenopathy. Mild right paratracheal adenopathy measuring up to 1.1 cm (series 2/ image 59), not appreciably changed since 09/14/2016 using similar measurement technique. Stable enlarged 1.4 cm subcarinal node (series 2/  image 64). Mild bilateral hilar adenopathy measuring up to 1.3 cm on the right (series 2/ image 66), subjectively stable since the prior noncontrast chest CT. Lungs/Pleura: No pneumothorax. Small dependent right pleural effusion. Moderate dependent left pleural effusion. Moderate centrilobular emphysema. Mild compressive atelectasis in the dependent right lower and right upper lobes. Complete left lower lobe atelectasis. Moderate dependent lingular compressive atelectasis. No evidence of central airway stenosis. No discrete lung masses or significant pulmonary nodules in the aerated portions of the lungs. Interlobular septal thickening throughout both lungs. Upper abdomen: Contrast reflux into the IVC and hepatic veins. Exophytic simple 1.4 cm medial upper left renal cyst. Musculoskeletal: No aggressive appearing focal osseous lesions. Acute minimally displaced posterior left seventh and eighth rib fractures. Acute nondisplaced lateral left fourth, fifth, sixth and seventh rib fractures. Moderate T11 vertebral compression fracture is new since 09/14/2016.  Chronic mild T7, mild T8, mild T10, mild to moderate T12 and superior L1, L2 and L3 vertebral compression fractures are stable, noting stable vertebroplasty material in the L2 and L3 vertebral bodies. Moderate thoracic spondylosis. IMPRESSION: 1. Spectrum of findings compatible with congestive heart failure, including cardiomegaly, diffuse interlobular septal thickening indicating mild pulmonary edema and small right and moderate left dependent pleural effusions. 2. Complete left lower lobe atelectasis. Mild-to-moderate compressive atelectasis in the dependent right upper and right lower lobes and lingula. 3. Stable mild mediastinal and bilateral hilar lymphadenopathy, compatible with reactive adenopathy. 4. Acute left fourth through eighth rib fractures. Moderate T11 vertebral compression fracture is new since 09/14/2016. 5. Left main and 3 vessel coronary atherosclerosis. Aortic Atherosclerosis (ICD10-I70.0) and Emphysema (ICD10-J43.9). Electronically Signed   By: Ilona Sorrel M.D.   On: 03/05/2017 17:29        Scheduled Meds: . acetylcysteine  3 mL Nebulization Q4H  . albuterol  2.5 mg Nebulization Q4H  . calcium-vitamin D  1 tablet Oral BID  . docusate sodium  100 mg Oral BID  . donepezil  5 mg Oral QHS  . felodipine  2.5 mg Oral Daily  . fluticasone furoate-vilanterol  1 puff Inhalation Daily  . guaiFENesin  1,200 mg Oral BID  . latanoprost  1 drop Both Eyes QHS  . levothyroxine  50 mcg Oral QAC breakfast  . mouth rinse  15 mL Mouth Rinse BID  . methylPREDNISolone (SOLU-MEDROL) injection  40 mg Intravenous Q12H  . pantoprazole  40 mg Oral Daily  . sertraline  100 mg Oral Daily  . warfarin  2 mg Oral Once  . Warfarin - Pharmacist Dosing Inpatient   Does not apply Q24H   Continuous Infusions:    LOS: 3 days    Time spent: Taylor, MD [facility]   If 7PM-7AM, please contact night-coverage [email or web address] Password [facility] 03/07/2017, 9:31 AM

## 2017-03-07 NOTE — Progress Notes (Signed)
Episode of Aspiration witnessed by the RN followed by desaturation , patient is desaturating on high flow o2 . I will start BIPAP support and initiate Zosyn for Aspiration PNA . Keep NPO for now .

## 2017-03-07 NOTE — Progress Notes (Signed)
ANTICOAGULATION CONSULT NOTE - follow up  Pharmacy Consult for Warfarin Indication: atrial fibrillation  Allergies  Allergen Reactions  . Propoxyphene N-Acetaminophen Shortness Of Breath  . Anoro Ellipta [Umeclidinium-Vilanterol] Other (See Comments)    Blurry Vision   . Codeine Nausea And Vomiting  . Meperidine Hcl Nausea And Vomiting  . Morphine Nausea And Vomiting  . Shellfish Allergy Hives    Patient Measurements: Height: 5\' 2"  (157.5 cm) Weight: 128 lb 1.4 oz (58.1 kg) IBW/kg (Calculated) : 50.1  Vital Signs: Temp: 98.1 F (36.7 C) (08/19 0716) Temp Source: Oral (08/19 0716) BP: 136/95 (08/19 0600) Pulse Rate: 107 (08/19 0716)  Labs:  Recent Labs  03/05/17 0741 03/06/17 0601 03/07/17 0522  HGB 12.1 12.4  --   HCT 36.7 36.8  --   PLT 272 276  --   LABPROT 30.6* 26.9* 28.0*  INR 2.86 2.43 2.56  CREATININE 0.70 0.67 0.70    Estimated Creatinine Clearance: 37.7 mL/min (by C-G formula based on SCr of 0.7 mg/dL).   Medical History: Past Medical History:  Diagnosis Date  . Anemia   . Anxiety   . Aortic sclerosis   . Arthritis   . CHF (congestive heart failure) ([place])   . Chronic atrial fibrillation ([place])   . Chronic gastritis 10/2009   Duodenitis on EGD  . CKD (chronic kidney disease) stage 2, GFR 60-89 ml/min   . Collagen vascular disease ([place])   . COPD (chronic obstructive pulmonary disease) ([place])   . Depression   . Diabetes type 2, controlled ([place])    Diet controlled  . Diverticulosis   . Dry ARMD    Retinal hemorrhage (09/2013) [place]  . Essential hypertension, benign   . GERD (gastroesophageal reflux disease)   . Glaucoma    Dr. Venetia Maxon  . Hiatal hernia 2011   Small  . History of pelvic fracture April 2013  . History of rheumatic fever   . History of skin cancer   . History of TIA (transient ischemic attack)   . Hypothyroidism   . Macular degeneration   . Mixed hyperlipidemia   . Osteoporosis 04/2013   Thoracic compression fracture, on  bisphosphonate and cal/vit D  . Oxygen deficiency   . Pulmonary nodules    Stable bilateral on CT 01/2010, rec rpt yearly for 2 yrs, pt decided to stop f/u  . Seasonal allergies   . Vertebral compression fracture ([place]) 05/2016   Vertebroplasty L2/L3    Medications:  Prescriptions Prior to Admission  Medication Sig Dispense Refill Last Dose  . acetaminophen (TYLENOL) 500 MG tablet Take 2 tablets (1,000 mg total) by mouth 3 (three) times daily. (Patient taking differently: Take 1,000 mg by mouth every 8 (eight) hours as needed for mild pain. )   Taking  . Blood Glucose Monitoring Suppl (FREESTYLE LITE) DEVI Check blood sugar once daily and as directed. Dx E11.9 90 each 1 Taking  . Calcium-Magnesium-Vitamin D 601-093-235 MG-MG-UNIT TABS Take 1 tablet by mouth 2 (two) times daily.   03/07/2017 at Unknown time  . donepezil (ARICEPT) 5 MG tablet TAKE 1 TABLET(5 MG) BY MOUTH AT BEDTIME 30 tablet 6 03/01/2017 at Unknown time  . felodipine (PLENDIL) 2.5 MG 24 hr tablet TAKE 1 TABLET DAILY 90 tablet 1 02/20/2017 at Unknown time  . ferrous sulfate 325 (65 FE) MG tablet Take 325 mg by mouth daily with breakfast.   03/17/2017 at Unknown time  . fluticasone furoate-vilanterol (BREO ELLIPTA) 100-25 MCG/INH AEPB Inhale 1 puff into the lungs daily.  1 each 6 03/01/2017 at Unknown time  . furosemide (LASIX) 40 MG tablet Take 80mg  in the morning, 40mg  in afternoon 90 tablet 6 03/14/2017 at Unknown time  . glucose blood (FREESTYLE LITE) test strip Check blood sugar once daily and as directed. Dx E11.9 100 each 1 Taking  . Lancets (FREESTYLE) lancets Check blood sugar once daily and as directed. Dx E11.9 100 each 1 Taking  . latanoprost (XALATAN) 0.005 % ophthalmic solution Place 1 drop into both eyes at bedtime.    03/01/2017 at Unknown time  . lidocaine (LIDODERM) 5 % Place 1 patch onto the skin daily. Remove & Discard patch within 12 hours or as directed by MD 30 patch 6 03/11/2017 at Unknown time  . LORazepam (ATIVAN)  0.5 MG tablet Take 1 tablet (0.5 mg total) by mouth every 8 (eight) hours as needed for anxiety. 60 tablet 0 02/27/2017 at Unknown time  . Misc Natural Products (FIBER 7) POWD Take by mouth daily.   Taking  . Multiple Vitamin (MULTIVITAMIN) capsule Take 1 capsule by mouth every morning.    03/12/2017 at Unknown time  . Omega-3 Fatty Acids (FISH OIL CONCENTRATE) 1000 MG CAPS Take 2 capsules by mouth 2 (two) times daily.     03/07/2017 at Unknown time  . potassium chloride SA (K-DUR,KLOR-CON) 20 MEQ tablet Take 1 tablet (20 mEq total) by mouth daily. 30 tablet 6 03/17/2017 at Unknown time  . sertraline (ZOLOFT) 100 MG tablet Take 1 tablet (100 mg total) by mouth daily. 90 tablet 1 02/21/2017 at Unknown time  . vitamin B-12 (CYANOCOBALAMIN) 500 MCG tablet Take 500 mcg by mouth daily.   03/19/2017 at Unknown time  . warfarin (COUMADIN) 2.5 MG tablet Take 1 tablet daily except 1 1/2 tablets on Mondays and Thursdays (Patient taking differently: Take 3.75 mg by mouth every evening. 2 tablets mon,tue, wed,fri,and 1/2 tablet on tue,thur, sat sun) 180 tablet 3 03/17/2017 at 800  . levothyroxine (SYNTHROID, LEVOTHROID) 50 MCG tablet TAKE 1 TABLET DAILY 90 tablet 3 Taking   Assessment: Okay for Protocol, Recent fall will rib pain. INR 2.56 this AM  Goal of Therapy:  INR 2-3   Plan:  Warfarin 2 mg PO x 1 Daily PT/INR Monitor for signs and symptoms of bleeding.   [name] Dalaney Needle Wimer, [place] 03/07/2017,8:41 AM

## 2017-03-08 ENCOUNTER — Encounter (HOSPITAL_COMMUNITY): Payer: Self-pay | Admitting: Primary Care

## 2017-03-08 ENCOUNTER — Telehealth: Payer: Self-pay | Admitting: *Deleted

## 2017-03-08 ENCOUNTER — Encounter: Payer: Self-pay | Admitting: *Deleted

## 2017-03-08 DIAGNOSIS — W19XXXA Unspecified fall, initial encounter: Secondary | ICD-10-CM

## 2017-03-08 DIAGNOSIS — Z7189 Other specified counseling: Secondary | ICD-10-CM

## 2017-03-08 DIAGNOSIS — Z515 Encounter for palliative care: Secondary | ICD-10-CM

## 2017-03-08 LAB — CBC
HCT: 39.9 % (ref 36.0–46.0)
Hemoglobin: 13.1 g/dL (ref 12.0–15.0)
MCH: 34 pg (ref 26.0–34.0)
MCHC: 32.8 g/dL (ref 30.0–36.0)
MCV: 103.6 fL — AB (ref 78.0–100.0)
PLATELETS: 329 10*3/uL (ref 150–400)
RBC: 3.85 MIL/uL — AB (ref 3.87–5.11)
RDW: 15.8 % — AB (ref 11.5–15.5)
WBC: 17.2 10*3/uL — AB (ref 4.0–10.5)

## 2017-03-08 LAB — PROTIME-INR
INR: 3.47
PROTHROMBIN TIME: 35.7 s — AB (ref 11.4–15.2)

## 2017-03-08 MED ORDER — SODIUM CHLORIDE 0.9 % IV SOLN
2.0000 mg/h | INTRAVENOUS | Status: DC
  Administered 2017-03-08: 2 mg/h via INTRAVENOUS
  Filled 2017-03-08: qty 10

## 2017-03-08 MED ORDER — LORAZEPAM 2 MG/ML IJ SOLN
1.0000 mg | INTRAMUSCULAR | Status: DC | PRN
  Filled 2017-03-08: qty 1

## 2017-03-08 MED ORDER — POLYVINYL ALCOHOL 1.4 % OP SOLN
2.0000 [drp] | OPHTHALMIC | Status: DC | PRN
  Filled 2017-03-08: qty 15

## 2017-03-08 MED ORDER — ATROPINE SULFATE 1 % OP SOLN
2.0000 [drp] | Freq: Three times a day (TID) | OPHTHALMIC | Status: DC
  Filled 2017-03-08: qty 2

## 2017-03-08 MED ORDER — FUROSEMIDE 10 MG/ML IJ SOLN
40.0000 mg | Freq: Once | INTRAMUSCULAR | Status: AC
  Administered 2017-03-08: 40 mg via INTRAVENOUS
  Filled 2017-03-08: qty 4

## 2017-03-08 MED ORDER — LORAZEPAM 2 MG/ML IJ SOLN
1.0000 mg | Freq: Two times a day (BID) | INTRAMUSCULAR | Status: DC | PRN
  Administered 2017-03-08: 1 mg via INTRAVENOUS

## 2017-03-10 ENCOUNTER — Other Ambulatory Visit: Payer: Self-pay | Admitting: Licensed Clinical Social Worker

## 2017-03-10 ENCOUNTER — Encounter: Payer: Self-pay | Admitting: Licensed Clinical Social Worker

## 2017-03-10 ENCOUNTER — Ambulatory Visit: Payer: Medicare Other | Admitting: Podiatry

## 2017-03-10 NOTE — Patient Outreach (Signed)
Assessment:  CSW received communication from [facility] regarding client status. Joellyn Quails RN reported to [facility] that client expired on 2017/03/09.  Maudie Mercury reported that she planned to discharge client from [place] services due to death of client. CSW is also discharging [employer]. Mutch from [facility] services on 2017/03/11 due to death of client on 03-09-2017.    Plan:  CSW is discharging Ann H. Banfill from [facility] services on 03-11-17 due to death of client on 09-Mar-2017.  CSW to inform Alycia Rossetti, Case Management Assistant, that [facility] discharged client from [facility] CSW services on 03-11-2017.  CSW to fax physician case closure letter to Quinn Axe, Nurse Practitioner, informing Quinn Axe that [facility] discharged client from [facility] services on 2017/03/11 due to death of client on 03/09/17.  Norva Riffle.Tiearra Colwell MSW, LCSW Licensed Clinical Social Worker [facility] [phone]

## 2017-03-11 ENCOUNTER — Ambulatory Visit: Payer: Medicare Other | Admitting: Psychology

## 2017-03-17 ENCOUNTER — Telehealth: Payer: Self-pay | Admitting: Family Medicine

## 2017-03-17 NOTE — Telephone Encounter (Signed)
Called and spoke with husband about patient's passing, expressed my condolences.

## 2017-03-20 NOTE — Progress Notes (Signed)
Subjective: Yesterday afternoon she had an episode of witnessed aspiration followed by desaturation. She is on BiPAP and on 100% oxygen now. Focus more toward comfort care at this point.  Objective: Vital signs in last 24 hours: Temp:  [97.4 F (36.3 C)-99.2 F (37.3 C)] 98.5 F (36.9 C) (08/20 0400) Pulse Rate:  [99-148] 122 (08/20 0600) Resp:  [13-29] 21 (08/20 0600) BP: (92-167)/(45-120) 141/89 (08/20 0600) SpO2:  [62 %-98 %] 88 % (08/20 0600) FiO2 (%):  [100 %] 100 % (08/20 0445) Weight:  [57 kg (125 lb 10.6 oz)] 57 kg (125 lb 10.6 oz) (08/20 0500) Weight change: -1.1 kg (-2 lb 6.8 oz) Last BM Date: 03/04/17  Intake/Output from previous day: 08/19 0701 - 08/20 0700 In: 270 [P.O.:120; IV Piggyback:150] Out: 1750 [Urine:1750]  PHYSICAL EXAM General appearance: Sleeping on BiPAP Resp: rhonchi bilaterally Cardio: irregularly irregular rhythm GI: soft, non-tender; bowel sounds normal; no masses,  no organomegaly Extremities: extremities normal, atraumatic, no cyanosis or edema Skin turgor fair  Lab Results:  Results for orders placed or performed during the hospital encounter of 03/18/2017 (from the past 48 hour(s))  Protime-INR     Status: Abnormal   Collection Time: 03/07/17  5:22 AM  Result Value Ref Range   Prothrombin Time 28.0 (H) 11.4 - 15.2 seconds   INR 7.58   Basic metabolic panel     Status: Abnormal   Collection Time: 03/07/17  5:22 AM  Result Value Ref Range   Sodium 140 135 - 145 mmol/L   Potassium 4.1 3.5 - 5.1 mmol/L   Chloride 103 101 - 111 mmol/L   CO2 27 22 - 32 mmol/L   Glucose, Bld 148 (H) 65 - 99 mg/dL   BUN 26 (H) 6 - 20 mg/dL   Creatinine, Ser 0.70 0.44 - 1.00 mg/dL   Calcium 8.8 (L) 8.9 - 10.3 mg/dL   GFR calc non Af Amer >60 >60 mL/min   GFR calc Af Amer >60 >60 mL/min    Comment: (NOTE) The eGFR has been calculated using the CKD EPI equation. This calculation has not been validated in all clinical situations. eGFR's persistently <60  mL/min signify possible Chronic Kidney Disease.    Anion gap 10 5 - 15  Blood gas, arterial     Status: Abnormal   Collection Time: 03/07/17 11:39 AM  Result Value Ref Range   O2 Content 15.0 L/min   Delivery systems HI FLOW NASAL CANNULA    pH, Arterial 7.405 7.350 - 7.450   pCO2 arterial 42.4 32.0 - 48.0 mmHg   pO2, Arterial 59.4 (L) 83.0 - 108.0 mmHg   Bicarbonate 25.6 20.0 - 28.0 mmol/L   Acid-Base Excess 1.8 0.0 - 2.0 mmol/L   O2 Saturation 87.6 %   Patient temperature 37.0    Collection site LEFT RADIAL    Drawn by [id number]    Sample type ARTERIAL    Allens test (pass/fail) PASS PASS  Protime-INR     Status: Abnormal   Collection Time: 2017/03/19  4:36 AM  Result Value Ref Range   Prothrombin Time 35.7 (H) 11.4 - 15.2 seconds   INR 3.47   CBC     Status: Abnormal   Collection Time: Mar 19, 2017  4:36 AM  Result Value Ref Range   WBC 17.2 (H) 4.0 - 10.5 K/uL   RBC 3.85 (L) 3.87 - 5.11 MIL/uL   Hemoglobin 13.1 12.0 - 15.0 g/dL   HCT 39.9 36.0 - 46.0 %   MCV 103.6 (H) 78.0 -  100.0 fL   MCH 34.0 26.0 - 34.0 pg   MCHC 32.8 30.0 - 36.0 g/dL   RDW 15.8 (H) 11.5 - 15.5 %   Platelets 329 150 - 400 K/uL    ABGS  Recent Labs  03/07/17 1139  PHART 7.405  PO2ART 59.4*  HCO3 25.6   CULTURES Recent Results (from the past 240 hour(s))  MRSA PCR Screening     Status: None   Collection Time: 03/06/17  6:30 AM  Result Value Ref Range Status   MRSA by PCR NEGATIVE NEGATIVE Final    Comment:        The GeneXpert MRSA Assay (FDA approved for NASAL specimens only), is one component of a comprehensive MRSA colonization surveillance program. It is not intended to diagnose MRSA infection nor to guide or monitor treatment for MRSA infections.    Studies/Results: Dg Chest Port 1 View  Result Date: 03/07/2017 CLINICAL DATA:  Hypoxia EXAM: PORTABLE CHEST 1 VIEW COMPARISON:  Chest CT March 05, 2017; chest radiograph March 04, 2017 FINDINGS: There is interstitial edema diffusely.  There are bilateral pleural effusions. There is airspace consolidation in the medial lung bases as well as throughout most of the left lower lobe. There is cardiomegaly with pulmonary venous hypertension. There is aortic atherosclerosis. No pneumothorax. No adenopathy. No bone lesions. IMPRESSION: Congestive heart failure. Question superimposed pneumonia in the left lower lobe and medial right base regions. There may well be compressive atelectasis in the left lower lobe as well given the adjacent pleural effusion, well-visualized on recent CT. There is aortic atherosclerosis. Aortic Atherosclerosis (ICD10-I70.0). Electronically Signed   By: Lowella Grip III M.D.   On: 03/07/2017 21:39    Medications:  Prior to Admission:  Prescriptions Prior to Admission  Medication Sig Dispense Refill Last Dose  . acetaminophen (TYLENOL) 500 MG tablet Take 2 tablets (1,000 mg total) by mouth 3 (three) times daily. (Patient taking differently: Take 1,000 mg by mouth every 8 (eight) hours as needed for mild pain. )   Taking  . Blood Glucose Monitoring Suppl (FREESTYLE LITE) DEVI Check blood sugar once daily and as directed. Dx E11.9 90 each 1 Taking  . Calcium-Magnesium-Vitamin D 540-981-191 MG-MG-UNIT TABS Take 1 tablet by mouth 2 (two) times daily.   03/14/2017 at Unknown time  . donepezil (ARICEPT) 5 MG tablet TAKE 1 TABLET(5 MG) BY MOUTH AT BEDTIME 30 tablet 6 03/01/2017 at Unknown time  . felodipine (PLENDIL) 2.5 MG 24 hr tablet TAKE 1 TABLET DAILY 90 tablet 1 03/15/2017 at Unknown time  . ferrous sulfate 325 (65 FE) MG tablet Take 325 mg by mouth daily with breakfast.   03/14/2017 at Unknown time  . fluticasone furoate-vilanterol (BREO ELLIPTA) 100-25 MCG/INH AEPB Inhale 1 puff into the lungs daily. 1 each 6 03/01/2017 at Unknown time  . furosemide (LASIX) 40 MG tablet Take 9m in the morning, 471min afternoon 90 tablet 6 03/15/2017 at Unknown time  . glucose blood (FREESTYLE LITE) test strip Check blood sugar  once daily and as directed. Dx E11.9 100 each 1 Taking  . Lancets (FREESTYLE) lancets Check blood sugar once daily and as directed. Dx E11.9 100 each 1 Taking  . latanoprost (XALATAN) 0.005 % ophthalmic solution Place 1 drop into both eyes at bedtime.    03/01/2017 at Unknown time  . lidocaine (LIDODERM) 5 % Place 1 patch onto the skin daily. Remove & Discard patch within 12 hours or as directed by MD 30 patch 6 02/18/2017 at Unknown time  .  LORazepam (ATIVAN) 0.5 MG tablet Take 1 tablet (0.5 mg total) by mouth every 8 (eight) hours as needed for anxiety. 60 tablet 0 02/18/2017 at Unknown time  . Misc Natural Products (FIBER 7) POWD Take by mouth daily.   Taking  . Multiple Vitamin (MULTIVITAMIN) capsule Take 1 capsule by mouth every morning.    03/19/2017 at Unknown time  . Omega-3 Fatty Acids (FISH OIL CONCENTRATE) 1000 MG CAPS Take 2 capsules by mouth 2 (two) times daily.     02/24/2017 at Unknown time  . potassium chloride SA (K-DUR,KLOR-CON) 20 MEQ tablet Take 1 tablet (20 mEq total) by mouth daily. 30 tablet 6 03/12/2017 at Unknown time  . sertraline (ZOLOFT) 100 MG tablet Take 1 tablet (100 mg total) by mouth daily. 90 tablet 1 03/09/2017 at Unknown time  . vitamin B-12 (CYANOCOBALAMIN) 500 MCG tablet Take 500 mcg by mouth daily.   02/26/2017 at Unknown time  . warfarin (COUMADIN) 2.5 MG tablet Take 1 tablet daily except 1 1/2 tablets on Mondays and Thursdays (Patient taking differently: Take 3.75 mg by mouth every evening. 2 tablets mon,tue, wed,fri,and 1/2 tablet on tue,thur, sat sun) 180 tablet 3 03/14/2017 at 800  . levothyroxine (SYNTHROID, LEVOTHROID) 50 MCG tablet TAKE 1 TABLET DAILY 90 tablet 3 Taking   Scheduled: . acetylcysteine  3 mL Nebulization Q4H  . albuterol  2.5 mg Nebulization Q4H  . calcium-vitamin D  1 tablet Oral BID  . docusate sodium  100 mg Oral BID  . donepezil  5 mg Oral QHS  . felodipine  2.5 mg Oral Daily  . fluticasone furoate-vilanterol  1 puff Inhalation Daily  .  guaiFENesin  1,200 mg Oral BID  . latanoprost  1 drop Both Eyes QHS  . levothyroxine  50 mcg Oral QAC breakfast  . mouth rinse  15 mL Mouth Rinse BID  . methylPREDNISolone (SOLU-MEDROL) injection  40 mg Intravenous Q12H  . pantoprazole  40 mg Oral Daily  . sertraline  100 mg Oral Daily  . warfarin  2 mg Oral Once  . Warfarin - Pharmacist Dosing Inpatient   Does not apply Q24H   Continuous: . piperacillin-tazobactam (ZOSYN)  IV 3.375 g (03/23/17 0606)   YKZ:[id number] **OR** acetaminophen, albuterol, ketorolac, LORazepam, morphine injection, ondansetron **OR** ondansetron (ZOFRAN) IV, sodium chloride, traMADol  Assesment: She was admitted with acute on chronic hypoxic respiratory failure and healthcare associated pneumonia with collapse of the left lower lobe. She has rib fractures after a fall. She is chronically anticoagulated because of chronic atrial fib. She aspirated and is much worse. She is on BiPAP and 100% oxygen. Not a candidate for bronchoscopy. Discussed with Dr. Burnis Medin and she is converting to comfort care. Principal Problem:   Acute on chronic respiratory failure with hypoxia (HCC) Active Problems:   Hypothyroidism   Compression fracture of vertebrae (HCC)   Chronic anticoagulation   Closed traumatic displaced fracture of one rib of left side   HCAP (healthcare-associated pneumonia)    Plan: I will follow more peripherally since she's converting to comfort care. Thanks for allowing me to see her with you. I will leave her on my list in case she improves    LOS: 4 days   Kelsa Jaworowski L 2017/03/23, 7:35 AM

## 2017-03-20 NOTE — Progress Notes (Signed)
PROGRESS NOTE    [name]  YBO:[id number] DOB: 10/08/1925 DOA: 03/10/2017 PCP: Ria Bush, MD    Brief Narrative:   83 y.o. female with medical history significant of compression fractures s/p vertebroplasty; HLD; macular degeneration; hypothyroidism; glaucoma; GERD; HTN; DM; COPD on 2L home O2; CKD; afib on Coumadin; and diastolic heart failure presenting with SOB after a fall was found to have displaced left fifth rib fracture.  Assessment & Plan:     Left lower lobe collapse 2/2 multiple rib fractures from the 4th ribs to the 8th .   Aspiration with evolving PNA    Acute on chronic respiratory failure with hypoxia (HCC)   Hypothyroidism   Multiple Compression fracture of vertebrae (HCC)   Chronic anticoagulation   Chronic A FIB      Overall with her age and the active medical problems she has very poor prognosis , family wants comfort approach only , will ask our palliative to proceed with [place] .  DVT prophylaxis: anticoagulated. Code Status: DNR. Family Communication: (son). Disposition Plan: (PT/OT EVAL   Consultants:   NONE   Procedures: NONE   Antimicrobials: (specify start and planned stop date. Auto populated tables are space occupying and do not give end dates) ZOSYN   Subjective:  Had an episode of witnessed aspiration yesterday led to sever hypoxia and tachypnia , refused BIPAP initially then family presented and asked for comfort approach , Morphin and Ativan initiated , this AM on BIPAP , not responsive and looks comfortable.   Objective: Vitals:   2017-03-16 0814 2017/03/16 0833 03-16-17 0900 March 16, 2017 0908  BP:   132/90 132/90  Pulse:  (!) 123 (!) 116 (!) 114  Resp:  20 19 19   Temp:      TempSrc:      SpO2: 91% 90% 91% 90%  Weight:      Height:        Intake/Output Summary (Last 24 hours) at March 16, 2017 1004 Last data filed at Mar 16, 2017 0606  Gross per 24 hour  Intake              270 ml  Output             1750 ml  Net             -1480 ml   Filed Weights   03/06/17 0522 03/07/17 0448 16-Mar-2017 0500  Weight: 59.4 kg (130 lb 15.3 oz) 58.1 kg (128 lb 1.4 oz) 57 kg (125 lb 10.6 oz)    Examination:  General exam: Appears calm and comfortable  Respiratory system:Lungs were clear to auscultation AT THE UPPER PARTS, left lower lobe auscultation showed decreased air entry . Cardiovascular system: S1 & S2 heard, RRR. No JVD, murmurs, rubs, gallops or clicks. No pedal edema. Gastrointestinal system: Abdomen is nondistended, soft and nontender. No organomegaly or masses felt. Normal bowel sounds heard. Central nervous system: Alert and oriented. No focal neurological deficits. Extremities: Symmetric 5 x 5 power. Skin: No rashes, lesions or ulcers Psychiatry: Judgement and insight appear normal. Mood & affect appropriate.     Data Reviewed: I have personally reviewed following labs and imaging studies  CBC:  Recent Labs Lab 03/10/2017 1410 03/03/17 0622 03/05/17 0741 03/06/17 0601 Mar 16, 2017 0436  WBC 11.0* 8.3 8.1 9.8 17.2*  NEUTROABS 8.3*  --   --   --   --   HGB 12.4 11.1* 12.1 12.4 13.1  HCT 37.2 33.6* 36.7 36.8 39.9  MCV 100.8* 102.8* 101.7* 102.5* 103.6*  PLT 250 232 272 276 621   Basic Metabolic Panel:  Recent Labs Lab 03/11/2017 1410 03/03/17 0622 03/05/17 0741 03/06/17 0601 03/07/17 0522  NA 133* 139 135 138 140  K 4.0 4.0 4.1 3.8 4.1  CL 102 106 102 105 103  CO2 23 26 26 26 27   GLUCOSE 147* 92 122* 116* 148*  BUN 20 18 21* 17 26*  CREATININE 0.73 0.66 0.70 0.67 0.70  CALCIUM 8.9 8.2* 8.4* 8.4* 8.8*   GFR: Estimated Creatinine Clearance: 37.7 mL/min (by C-G formula based on SCr of 0.7 mg/dL). Liver Function Tests:  Recent Labs Lab 03/05/17 0741 03/06/17 0601  AST 21 22  ALT 22 21  ALKPHOS 85 85  BILITOT 1.2 1.3*  PROT 6.5 6.5  ALBUMIN 3.0* 3.1*   No results for input(s): LIPASE, AMYLASE in the last 168 hours. No results for input(s): AMMONIA in the last 168 hours. Coagulation  Profile:  Recent Labs Lab 03/04/17 0529 03/05/17 0741 03/06/17 0601 03/07/17 0522 Mar 20, 2017 0436  INR 3.82 2.86 2.43 2.56 3.47   Cardiac Enzymes: No results for input(s): CKTOTAL, CKMB, CKMBINDEX, TROPONINI in the last 168 hours. BNP (last 3 results)  Recent Labs  01/28/17 1249  PROBNP 742.0*   HbA1C: No results for input(s): HGBA1C in the last 72 hours. CBG: No results for input(s): GLUCAP in the last 168 hours. Lipid Profile: No results for input(s): CHOL, HDL, LDLCALC, TRIG, CHOLHDL, LDLDIRECT in the last 72 hours. Thyroid Function Tests: No results for input(s): TSH, T4TOTAL, FREET4, T3FREE, THYROIDAB in the last 72 hours. Anemia Panel: No results for input(s): VITAMINB12, FOLATE, FERRITIN, TIBC, IRON, RETICCTPCT in the last 72 hours. Urine analysis:    Component Value Date/Time   COLORURINE YELLOW 07/26/2016 2219   APPEARANCEUR HAZY (A) 07/26/2016 2219   LABSPEC 1.004 (L) 07/26/2016 2219   PHURINE 8.0 07/26/2016 2219   GLUCOSEU NEGATIVE 07/26/2016 2219   HGBUR NEGATIVE 07/26/2016 2219   BILIRUBINUR neg 11/03/2016 1324   KETONESUR NEGATIVE 07/26/2016 2219   PROTEINUR neg 11/03/2016 1324   PROTEINUR NEGATIVE 07/26/2016 2219   UROBILINOGEN negative (A) 11/03/2016 1324   UROBILINOGEN 0.2 12/25/2013 2250   NITRITE neg 11/03/2016 1324   NITRITE NEGATIVE 07/26/2016 2219   LEUKOCYTESUR Small (1+) (A) 11/03/2016 1324   Sepsis Labs: @LABRCNTIP (procalcitonin:4,lacticidven:4)  ) Recent Results (from the past 240 hour(s))  MRSA PCR Screening     Status: None   Collection Time: 03/06/17  6:30 AM  Result Value Ref Range Status   MRSA by PCR NEGATIVE NEGATIVE Final    Comment:        The GeneXpert MRSA Assay (FDA approved for NASAL specimens only), is one component of a comprehensive MRSA colonization surveillance program. It is not intended to diagnose MRSA infection nor to guide or monitor treatment for MRSA infections.          Radiology Studies: Dg  Chest Port 1 View  Result Date: 03/07/2017 CLINICAL DATA:  Hypoxia EXAM: PORTABLE CHEST 1 VIEW COMPARISON:  Chest CT March 05, 2017; chest radiograph March 04, 2017 FINDINGS: There is interstitial edema diffusely. There are bilateral pleural effusions. There is airspace consolidation in the medial lung bases as well as throughout most of the left lower lobe. There is cardiomegaly with pulmonary venous hypertension. There is aortic atherosclerosis. No pneumothorax. No adenopathy. No bone lesions. IMPRESSION: Congestive heart failure. Question superimposed pneumonia in the left lower lobe and medial right base regions. There may well be compressive atelectasis in the left lower lobe  as well given the adjacent pleural effusion, well-visualized on recent CT. There is aortic atherosclerosis. Aortic Atherosclerosis (ICD10-I70.0). Electronically Signed   By: Lowella Grip III M.D.   On: 03/07/2017 21:39        Scheduled Meds: . acetylcysteine  3 mL Nebulization Q4H  . albuterol  2.5 mg Nebulization Q4H  . calcium-vitamin D  1 tablet Oral BID  . docusate sodium  100 mg Oral BID  . donepezil  5 mg Oral QHS  . felodipine  2.5 mg Oral Daily  . fluticasone furoate-vilanterol  1 puff Inhalation Daily  . guaiFENesin  1,200 mg Oral BID  . latanoprost  1 drop Both Eyes QHS  . levothyroxine  50 mcg Oral QAC breakfast  . mouth rinse  15 mL Mouth Rinse BID  . methylPREDNISolone (SOLU-MEDROL) injection  40 mg Intravenous Q12H  . pantoprazole  40 mg Oral Daily  . sertraline  100 mg Oral Daily  . warfarin  2 mg Oral Once  . Warfarin - Pharmacist Dosing Inpatient   Does not apply Q24H   Continuous Infusions: . piperacillin-tazobactam (ZOSYN)  IV Stopped (03-27-17 1006)     LOS: 4 days    Time spent: 63 m    Waldron Session, MD [facility]   If 7PM-7AM, please contact night-coverage [email or web address] Password [email or web address] March 27, 2017, 10:04 AM

## 2017-03-20 NOTE — Discharge Summary (Signed)
Physician Discharge Summary  [name] WIO:[id number] DOB: 1925/11/14 DOA: 02/23/2017  PCP: Drue Novel, NP  Admit date: 02/27/2017 Discharge date: Mar 26, 2017  Admitted From: (Home) Disposition:  Expired   Discharge Condition:EXPIRED  CODE STATUS: DNR  Diet recommendation: Heart Healthy / Carb Modified / Regular / Dysphagia   Brief/Interim Summary:  83 y.o.femalewith medical history significant of compression fractures s/p vertebroplasty; HLD; macular degeneration; hypothyroidism; glaucoma; GERD; HTN; DM; COPD on 2L home O2; CKD; afib on Coumadin; and diastolic heart failure presenting with SOB after a fall was found to have displaced left fifth rib fracture Through the eighth rib. Hospital course has complicated with left lower lobe collapse and aspiration pneumonia with deterioration in her hypoxic status to require BiPAP support with a poor prognosis overall family asked for comfort approach we started morphine and Ativan in collaboration with palliative consult. Patient expired at 15 27 PM 03-26-17 .     Discharge Diagnoses:     Left lower lobe collapse 2/2 multiple rib fractures from the 4th ribs to the 8th .   Aspiration with evolving PNA    Acute on chronic respiratory failure with hypoxia (HCC)   Hypothyroidism   Multiple Compression fracture of vertebrae (HCC)   Chronic anticoagulation   Chronic A FIB    Discharge Instructions     Allergies  Allergen Reactions  . Propoxyphene N-Acetaminophen Shortness Of Breath  . Anoro Ellipta [Umeclidinium-Vilanterol] Other (See Comments)    Blurry Vision   . Codeine Nausea And Vomiting  . Meperidine Hcl Nausea And Vomiting  . Morphine Nausea And Vomiting  . Shellfish Allergy Hives    Consultations:  None      Procedures/Studies: Dg Chest 1 View  Result Date: 03/04/2017 CLINICAL DATA:  Shortness of Breath EXAM: CHEST 1 VIEW COMPARISON:  March 02, 2017 FINDINGS: There is extensive airspace  consolidation throughout the left mid lower lung zones with left pleural effusion. There is a small right pleural effusion with slight atelectasis in the right base. There is cardiomegaly with pulmonary venous hypertension. No adenopathy. Aorta is tortuous with atherosclerotic calcification in the aorta. No evident adenopathy. Recently noted left fifth rib fracture is not appreciable on this examination. No pneumothorax. IMPRESSION: Airspace consolidation, likely pneumonia, throughout the left mid and lower lung zones. There is a moderate left effusion with a rather minimal right pleural effusion. There is pulmonary vascular congestion. Cardiomegaly is stable. Aorta is somewhat tortuous with aortic atherosclerosis. Aortic Atherosclerosis (ICD10-I70.0). Electronically Signed   By: Lowella Grip III M.D.   On: 03/04/2017 12:45   Dg Ribs Unilateral W/chest Left  Result Date: 02/19/2017 CLINICAL DATA:  Pain behind the left scapula after fall. Initial encounter. EXAM: LEFT RIBS AND CHEST - 3+ VIEW COMPARISON:  Chest x-ray 10/13/2016 FINDINGS: Left lateral fifth rib fracture with 50% displacement. There is left pleuroparenchymal opacity that is chronic based on prior. No evidence of pneumothorax. Compression fractures of T8, T10, T11, T12, L1, L2, and L3. L2 and L3 have undergone vertebroplasty. The T11 fracture is new based on 09/14/2016 chest CT. The other fractures were also seen on the prior study. Dextrocurvature centered at L2. Cardiomegaly. IMPRESSION: 1. Displaced left fifth rib fracture.  No pneumothorax. 2. Small left pleural effusion and lower lobe atelectasis that is chronic based on priors. 3. Multiple lower thoracic and lumbar compression fractures, as above. A T11 fracture with moderate height loss has occurred since 09/14/2016 chest CT comparison. Electronically Signed   By: Neva Seat.D.  On: 02/22/2017 15:40   Ct Head Wo Contrast  Result Date: 02/18/2017 CLINICAL DATA:  Head trauma,  minor. There was fall 3 days ago from bed. Patient on anti coagulation. EXAM: CT HEAD WITHOUT CONTRAST TECHNIQUE: Contiguous axial images were obtained from the base of the skull through the vertex without intravenous contrast. COMPARISON:  06/06/2012. FINDINGS: Brain: No evidence of acute infarction, hemorrhage, hydrocephalus, extra-axial collection or mass lesion/mass effect. Mild for age generalized cerebral volume loss and periventricular white matter low-density. Vascular: Atherosclerotic calcification. Skull: Negative for fracture Sinuses/Orbits: No evidence of injury. IMPRESSION: Negative study.  No evidence of injury. Electronically Signed   By: Monte Fantasia M.D.   On: 02/21/2017 15:12   Ct Chest W Contrast  Result Date: 03/05/2017 CLINICAL DATA:  Inpatient.  Pneumonia.  Dyspnea.  CHF. EXAM: CT CHEST WITH CONTRAST TECHNIQUE: Multidetector CT imaging of the chest was performed during intravenous contrast administration. CONTRAST:  3mL ISOVUE-300 IOPAMIDOL (ISOVUE-300) INJECTION 61% COMPARISON:  Chest radiograph from one day prior. 09/14/2016 chest CT. FINDINGS: Cardiovascular: Mild-to-moderate cardiomegaly, increased since 09/14/2016 . Trace pericardial effusion/thickening, not appreciably changed. Left main, left anterior descending, left circumflex and right coronary atherosclerosis. Atherosclerotic nonaneurysmal thoracic aorta. Stable top-normal caliber main pulmonary artery (3.0 cm diameter). No central pulmonary emboli. Mediastinum/Nodes: No discrete thyroid nodules. Unremarkable esophagus. No axillary adenopathy. Mild right paratracheal adenopathy measuring up to 1.1 cm (series 2/ image 59), not appreciably changed since 09/14/2016 using similar measurement technique. Stable enlarged 1.4 cm subcarinal node (series 2/ image 64). Mild bilateral hilar adenopathy measuring up to 1.3 cm on the right (series 2/ image 66), subjectively stable since the prior noncontrast chest CT. Lungs/Pleura: No  pneumothorax. Small dependent right pleural effusion. Moderate dependent left pleural effusion. Moderate centrilobular emphysema. Mild compressive atelectasis in the dependent right lower and right upper lobes. Complete left lower lobe atelectasis. Moderate dependent lingular compressive atelectasis. No evidence of central airway stenosis. No discrete lung masses or significant pulmonary nodules in the aerated portions of the lungs. Interlobular septal thickening throughout both lungs. Upper abdomen: Contrast reflux into the IVC and hepatic veins. Exophytic simple 1.4 cm medial upper left renal cyst. Musculoskeletal: No aggressive appearing focal osseous lesions. Acute minimally displaced posterior left seventh and eighth rib fractures. Acute nondisplaced lateral left fourth, fifth, sixth and seventh rib fractures. Moderate T11 vertebral compression fracture is new since 09/14/2016. Chronic mild T7, mild T8, mild T10, mild to moderate T12 and superior L1, L2 and L3 vertebral compression fractures are stable, noting stable vertebroplasty material in the L2 and L3 vertebral bodies. Moderate thoracic spondylosis. IMPRESSION: 1. Spectrum of findings compatible with congestive heart failure, including cardiomegaly, diffuse interlobular septal thickening indicating mild pulmonary edema and small right and moderate left dependent pleural effusions. 2. Complete left lower lobe atelectasis. Mild-to-moderate compressive atelectasis in the dependent right upper and right lower lobes and lingula. 3. Stable mild mediastinal and bilateral hilar lymphadenopathy, compatible with reactive adenopathy. 4. Acute left fourth through eighth rib fractures. Moderate T11 vertebral compression fracture is new since 09/14/2016. 5. Left main and 3 vessel coronary atherosclerosis. Aortic Atherosclerosis (ICD10-I70.0) and Emphysema (ICD10-J43.9). Electronically Signed   By: Ilona Sorrel M.D.   On: 03/05/2017 17:29   Dg Chest Port 1  View  Result Date: 03/07/2017 CLINICAL DATA:  Hypoxia EXAM: PORTABLE CHEST 1 VIEW COMPARISON:  Chest CT March 05, 2017; chest radiograph March 04, 2017 FINDINGS: There is interstitial edema diffusely. There are bilateral pleural effusions. There is airspace consolidation in the medial lung  bases as well as throughout most of the left lower lobe. There is cardiomegaly with pulmonary venous hypertension. There is aortic atherosclerosis. No pneumothorax. No adenopathy. No bone lesions. IMPRESSION: Congestive heart failure. Question superimposed pneumonia in the left lower lobe and medial right base regions. There may well be compressive atelectasis in the left lower lobe as well given the adjacent pleural effusion, well-visualized on recent CT. There is aortic atherosclerosis. Aortic Atherosclerosis (ICD10-I70.0). Electronically Signed   By: Lowella Grip III M.D.   On: 03/07/2017 21:39    (Echo, Carotid, EGD, Colonoscopy, ERCP)    Subjective:   Discharge Exam: Vitals:   25-Mar-2017 1211 2017-03-25 1300  BP:  123/75  Pulse: (!) 135 (!) 113  Resp: (!) 21 19  Temp:    SpO2:  (!) 88%   Vitals:   2017-03-25 1200 2017/03/25 1211 March 25, 2017 1300 03/25/17 1542  BP: (!) 155/99  123/75   Pulse: (!) 134 (!) 135 (!) 113   Resp: (!) 21 (!) 21 19   Temp: (!) 100.7 F (38.2 C)     TempSrc: Axillary     SpO2: (!) 87%  (!) 88%   Weight:    57 kg (125 lb 10.6 oz)  Height:        General: Pt is alert, awake, not in acute distress Cardiovascular: RRR, S1/S2 +, no rubs, no gallops Respiratory: CTA bilaterally, no wheezing, no rhonchi Abdominal: Soft, NT, ND, bowel sounds + Extremities: no edema, no cyanosis    The results of significant diagnostics from this hospitalization (including imaging, microbiology, ancillary and laboratory) are listed below for reference.     Microbiology: Recent Results (from the past 240 hour(s))  MRSA PCR Screening     Status: None   Collection Time: 03/06/17  6:30 AM   Result Value Ref Range Status   MRSA by PCR NEGATIVE NEGATIVE Final    Comment:        The GeneXpert MRSA Assay (FDA approved for NASAL specimens only), is one component of a comprehensive MRSA colonization surveillance program. It is not intended to diagnose MRSA infection nor to guide or monitor treatment for MRSA infections.      Labs: BNP (last 3 results)  Recent Labs  07/12/16 1628 07/26/16 2211 09/11/16 1201  BNP 385.0* 364.0* 299.2*   Basic Metabolic Panel:  Recent Labs Lab 03/19/2017 1410 03/03/17 0622 03/05/17 0741 03/06/17 0601 03/07/17 0522  NA 133* 139 135 138 140  K 4.0 4.0 4.1 3.8 4.1  CL 102 106 102 105 103  CO2 23 26 26 26 27   GLUCOSE 147* 92 122* 116* 148*  BUN 20 18 21* 17 26*  CREATININE 0.73 0.66 0.70 0.67 0.70  CALCIUM 8.9 8.2* 8.4* 8.4* 8.8*   Liver Function Tests:  Recent Labs Lab 03/05/17 0741 03/06/17 0601  AST 21 22  ALT 22 21  ALKPHOS 85 85  BILITOT 1.2 1.3*  PROT 6.5 6.5  ALBUMIN 3.0* 3.1*   No results for input(s): LIPASE, AMYLASE in the last 168 hours. No results for input(s): AMMONIA in the last 168 hours. CBC:  Recent Labs Lab 03/06/2017 1410 03/03/17 0622 03/05/17 0741 03/06/17 0601 03/25/2017 0436  WBC 11.0* 8.3 8.1 9.8 17.2*  NEUTROABS 8.3*  --   --   --   --   HGB 12.4 11.1* 12.1 12.4 13.1  HCT 37.2 33.6* 36.7 36.8 39.9  MCV 100.8* 102.8* 101.7* 102.5* 103.6*  PLT 250 232 272 276 329   Cardiac Enzymes: No results  for input(s): CKTOTAL, CKMB, CKMBINDEX, TROPONINI in the last 168 hours. BNP: Invalid input(s): POCBNP CBG: No results for input(s): GLUCAP in the last 168 hours. D-Dimer No results for input(s): DDIMER in the last 72 hours. Hgb A1c No results for input(s): HGBA1C in the last 72 hours. Lipid Profile No results for input(s): CHOL, HDL, LDLCALC, TRIG, CHOLHDL, LDLDIRECT in the last 72 hours. Thyroid function studies No results for input(s): TSH, T4TOTAL, T3FREE, THYROIDAB in the last 72  hours.  Invalid input(s): FREET3 Anemia work up No results for input(s): VITAMINB12, FOLATE, FERRITIN, TIBC, IRON, RETICCTPCT in the last 72 hours. Urinalysis    Component Value Date/Time   COLORURINE YELLOW 07/26/2016 2219   APPEARANCEUR HAZY (A) 07/26/2016 2219   LABSPEC 1.004 (L) 07/26/2016 2219   PHURINE 8.0 07/26/2016 2219   GLUCOSEU NEGATIVE 07/26/2016 2219   HGBUR NEGATIVE 07/26/2016 2219   BILIRUBINUR neg 11/03/2016 1324   KETONESUR NEGATIVE 07/26/2016 2219   PROTEINUR neg 11/03/2016 1324   PROTEINUR NEGATIVE 07/26/2016 2219   UROBILINOGEN negative (A) 11/03/2016 1324   UROBILINOGEN 0.2 12/25/2013 2250   NITRITE neg 11/03/2016 1324   NITRITE NEGATIVE 07/26/2016 2219   LEUKOCYTESUR Small (1+) (A) 11/03/2016 1324   Sepsis Labs Invalid input(s): PROCALCITONIN,  WBC,  LACTICIDVEN Microbiology Recent Results (from the past 240 hour(s))  MRSA PCR Screening     Status: None   Collection Time: 03/06/17  6:30 AM  Result Value Ref Range Status   MRSA by PCR NEGATIVE NEGATIVE Final    Comment:        The GeneXpert MRSA Assay (FDA approved for NASAL specimens only), is one component of a comprehensive MRSA colonization surveillance program. It is not intended to diagnose MRSA infection nor to guide or monitor treatment for MRSA infections.      Time coordinating discharge: Over 30 minutes  SIGNED:   Waldron Session, MD  [facility] 03-16-17, 3:56 PM   If 7PM-7AM, please contact night-coverage [email or web address] Password [email or web address]

## 2017-03-20 NOTE — Progress Notes (Signed)
Nutrition Brief Note  RD drawn to pt through Low Braden Score. Chart reviewed.  Pt now transitioning to comfort care.  No further nutrition interventions warranted at this time.  Please re-consult as needed.   Colman Cater MS,RD,CSG,LDN Office: [phone] Pager: [phone]

## 2017-03-20 NOTE — Progress Notes (Signed)
[facility] notified  Of bladder scan result, firm distended abd and of todays output. Also informed of pt home lasix dose. New orders received for foley, chest xray and lasix.

## 2017-03-20 NOTE — Plan of Care (Signed)
Problem: Skin Integrity: Goal: Risk for impaired skin integrity will decrease Outcome: Progressing Pt turned and repositioned to help prevent skin injury.

## 2017-03-20 NOTE — Patient Outreach (Signed)
[facility]) Care Management  04-01-2017  MEDEA DEINES 06-Apr-1926 [id number]   Case closure  [facility] CM contacted by [facility] SW to review and collaborate on Mrs Batchelder status at the beginning of the day [facility] CM reviewed [facility] [place] [facility] notes (checking on progression at the end of the 2017/04/01 work day)  [facility] CM noted discharge note from attending MD indicating Mrs Bencosme expired 04/01/17 at 57 after palliative comfort care interventions provided  [facility] CM updated [facility] SW via email [facility] CM updated [facility] CMA via in basket  Case closure reason: consumer deceased  Plans case closure for [facility] CM- to further update [facility] SW   [facility] CM Care Plan Problem One     Most Recent Value  Care Plan Problem One  Knowledge Deficits related to Chronic Health Condition (CHF) as evidenced by patient and caregiver questions about plan of care  Role Documenting the Problem One  Care Management Coordinator  Care Plan for Problem One  Active  [facility] Long Term Goal   Knowledge Deficits related to Chronic Health Condition (CHF) as evidenced by patient and caregiver questions about plan of care and patient intake of increases sodium foods  [facility] Long Term Goal Start Date  12/23/16  [facility] Long Term Goal Met Date  02/19/17  Interventions for Problem One Long Term Goal   Review avoidance of fast foods, processed  and can foods that have increase salt content and lead to edema, review CHF low sodium diet   [facility] CM Short Term Goal #1   over the next 30 days the patient will decrease intake of sodium intake and take diuretics as ordered to decrease swelling   [facility] CM Short Term Goal #1 Start Date  12/23/16  [facility] CM Short Term Goal #1 Met Date  01/19/17  Interventions for Short Term Goal #1   Review avoidance of fast foods, processed  and can foods that have increase salt content and lead to edema, review CHF low sodium diet Review of mobility daily with and without her PT therapist and antacids for flatus Discussed  abdominal distension reasons,  symptoms to watch out for to call in to pcp/GI provider       [place]. Lavina Hamman, RN, BSN, [facility] [phone]

## 2017-03-20 NOTE — Consult Note (Signed)
Consultation Note Date: Mar 22, 2017   Patient Name: [name]  DOB: 05/01/1926  MRN: [id number]  Age / Sex: 83 y.o., female  PCP: Drue Novel, NP Referring Physician: Waldron Session, MD  Reason for Consultation: Establishing goals of care and Psychosocial/spiritual support  HPI/Patient Profile: 83 y.o. female  with past medical history of Anxiety and depression, arthritis, heart failure, stage II kidney disease, COPD, diabetes, history of pelvic fracture, history of osteoporosis, history of vertebral compression fractures, GER D, hiatal hernia, admitted on 03/15/2017 with acute on chronic respiratory failure with hypoxia likely related to pain from rib fractures.   Clinical Assessment and Goals of Care: Mrs. Cielo is lying in bed. She is pulling at her gown stating "hot". She is unable to open her eyes to touch or command, or follow directions. She does not try to communicate in any meaningful way. There is no family of bedside at this time. Return to my office and call husband, Phillippa Straub, leaving message. Call to return to ICU stating families present at bedside. Present now is husband Jacinto Halim. Discussion with family regarding Mrs. Philbin's decline, facts about oxygen saturation, the use of BiPAP, 100%Fi02. Kerry Dory and Legrand Como both state that in their family, they do not prolong the dying process. They stating that they understand Mrs. Snoke is dying at this time. We talk about medications for comfort including continuous morphine infusion, we talk about unburdening Mrs. Si from the BiPAP. Legrand Como states that he believes her time would be short once BiPAP is removed, and I agree. Family is ready to go forward with full comfort measures.  Conference with hospitalist, conference with nursing staff.  Healthcare power of attorney NEXT OF KIN - husband, Madelynne Lasker. Son  Lilli Dewald also at bedside.   SUMMARY OF RECOMMENDATIONS   full comfort measures, morphine drip, DC BiPAP for comfort.  Code Status/Advance Care Planning:  DNR  Symptom Management:   morphine continuous infusion, Ativan PRN.  Palliative Prophylaxis:   Frequent Pain Assessment and Turn Reposition  Additional Recommendations (Limitations, Scope, Preferences):  Full Comfort Care  Psycho-social/Spiritual:   Desire for further Chaplaincy support:no  Additional Recommendations: Caregiving  Support/Resources  Prognosis:   Hours - Days  Discharge Planning: Anticipated Hospital Death      Primary Diagnoses: Present on Admission: . Acute on chronic respiratory failure with hypoxia ([place]) . Compression fracture of vertebrae ([place]) . Hypothyroidism . Closed traumatic displaced fracture of one rib of left side . HCAP (healthcare-associated pneumonia)   I have reviewed the medical record, interviewed the patient and family, and examined the patient. The following aspects are pertinent.  Past Medical History:  Diagnosis Date  . Anemia   . Anxiety   . Aortic sclerosis   . Arthritis   . CHF (congestive heart failure) ([place])   . Chronic atrial fibrillation ([place])   . Chronic gastritis 10/2009   Duodenitis on EGD  . CKD (chronic kidney disease) stage 2, GFR 60-89 ml/min   . Collagen vascular disease ([place])   .  COPD (chronic obstructive pulmonary disease) ([place])   . Depression   . Diabetes type 2, controlled ([place])    Diet controlled  . Diverticulosis   . Dry ARMD    Retinal hemorrhage (09/2013) [place]  . Essential hypertension, benign   . GERD (gastroesophageal reflux disease)   . Glaucoma    Dr. Venetia Maxon  . Hiatal hernia 2011   Small  . History of pelvic fracture April 2013  . History of rheumatic fever   . History of skin cancer   . History of TIA (transient ischemic attack)   . Hypothyroidism   . Macular degeneration   . Mixed hyperlipidemia   .  Osteoporosis 04/2013   Thoracic compression fracture, on bisphosphonate and cal/vit D  . Oxygen deficiency   . Pulmonary nodules    Stable bilateral on CT 01/2010, rec rpt yearly for 2 yrs, pt decided to stop f/u  . Seasonal allergies   . Vertebral compression fracture ([place]) 05/2016   Vertebroplasty L2/L3   Social History   Social History  . Marital status: Married    Spouse name: N/A  . Number of children: N/A  . Years of education: N/A   Occupational History  . Retired     Former Therapist, sports   Social History Main Topics  . Smoking status: Former Smoker    Packs/day: 1.00    Years: 30.00    Types: Cigarettes    Quit date: 10/13/1998  . Smokeless tobacco: Never Used  . Alcohol use No  . Drug use: No  . Sexual activity: No   Other Topics Concern  . 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      [facility] (10/13/16):   Originally from [place]. Moved to the [place] at 83 y.o. She has lived in [place] and moved to [place] in 1963. Retired Therapist, sports. No known TB exposure. Has dogs currently. No bird or mold exposure.       Family History  Problem Relation Age of Onset  . Coronary artery disease Mother 62       MI deceased  . Colon cancer Father 71  . Asthma Father   . Asthma Sister   . Stroke Paternal Grandmother   . Diabetes Neg Hx    Scheduled Meds: . atropine  2 drop Sublingual TID  . mouth rinse  15 mL Mouth Rinse BID   Continuous Infusions: . morphine     PRN Meds:.acetaminophen **OR** acetaminophen, albuterol, LORazepam, morphine injection, ondansetron **OR** ondansetron (ZOFRAN) IV, polyvinyl alcohol, sodium chloride Medications Prior to Admission:  Prior to Admission medications   Medication Sig Start  Date End Date Taking? Authorizing Provider  acetaminophen (TYLENOL) 500 MG tablet Take 2 tablets (1,000 mg total) by mouth 3 (three) times daily. Patient taking differently: Take 1,000 mg by mouth every 8 (eight) hours as needed for mild pain.  12/23/15  Yes Ria Bush, MD  Blood Glucose Monitoring Suppl (FREESTYLE LITE) DEVI Check blood sugar once daily and as directed. Dx E11.9 10/08/16  Yes Ria Bush, MD  Calcium-Magnesium-Vitamin D [phone] MG-MG-UNIT TABS Take 1 tablet by mouth 2 (two) times daily.   Yes [provider]  donepezil (ARICEPT) 5 MG tablet TAKE 1 TABLET(5 MG) BY MOUTH AT BEDTIME 02/07/17  Yes Ria Bush, MD  felodipine (PLENDIL) 2.5 MG 24 hr tablet TAKE 1 TABLET DAILY 05/25/16  Yes Ria Bush, MD  ferrous sulfate 325 (65 FE) MG tablet Take 325 mg by mouth daily with breakfast.   Yes [provider]  fluticasone furoate-vilanterol (BREO ELLIPTA) 100-25 MCG/INH AEPB Inhale 1 puff into the lungs daily. 10/30/16  Yes Javier Glazier, MD  furosemide (LASIX) 40 MG tablet Take 80mg  in the morning, 40mg  in afternoon 12/17/16  Yes Ria Bush, MD  glucose blood (FREESTYLE LITE) test strip Check blood sugar once daily and as directed. Dx E11.9 10/08/16  Yes Ria Bush, MD  Lancets (FREESTYLE) lancets Check blood sugar once daily and as directed. Dx E11.9 10/08/16  Yes Ria Bush, MD  latanoprost (XALATAN) 0.005 % ophthalmic solution Place 1 drop into both eyes at bedtime.    Yes [provider]  lidocaine (LIDODERM) 5 % Place 1 patch onto the skin daily. Remove & Discard patch within 12 hours or as directed by MD 05/01/16  Yes Ria Bush, MD  LORazepam (ATIVAN) 0.5 MG tablet Take 1 tablet (0.5 mg total) by mouth every 8 (eight) hours as needed for anxiety. 07/17/16  Yes Ria Bush, MD  Misc Natural Products (FIBER 7) POWD Take by mouth daily.   Yes [provider]  Multiple Vitamin (MULTIVITAMIN)  capsule Take 1 capsule by mouth every morning.    Yes [provider]  Omega-3 Fatty Acids (FISH OIL CONCENTRATE) 1000 MG CAPS Take 2 capsules by mouth 2 (two) times daily.     Yes [provider]  potassium chloride SA (K-DUR,KLOR-CON) 20 MEQ tablet Take 1 tablet (20 mEq total) by mouth daily. 02/01/17  Yes Ria Bush, MD  sertraline (ZOLOFT) 100 MG tablet Take 1 tablet (100 mg total) by mouth daily. 09/02/16  Yes Ria Bush, MD  vitamin B-12 (CYANOCOBALAMIN) 500 MCG tablet Take 500 mcg by mouth daily.   Yes [provider]  warfarin (COUMADIN) 2.5 MG tablet Take 1 tablet daily except 1 1/2 tablets on Mondays and Thursdays Patient taking differently: Take 3.75 mg by mouth every evening. 2 tablets mon,tue, wed,fri,and 1/2 tablet on tue,thur, sat sun 04/28/16  Yes Satira Sark, MD  levothyroxine (SYNTHROID, LEVOTHROID) 50 MCG tablet TAKE 1 TABLET DAILY 06/18/16   Ria Bush, MD   Allergies  Allergen Reactions  . Propoxyphene N-Acetaminophen Shortness Of Breath  . Anoro Ellipta [Umeclidinium-Vilanterol] Other (See Comments)    Blurry Vision   . Codeine Nausea And Vomiting  . Meperidine Hcl Nausea And Vomiting  . Morphine Nausea And Vomiting  . Shellfish Allergy Hives   Review of Systems  Unable to perform ROS: Acuity of condition    Physical Exam  Constitutional:  Appears frail, does not open eyes to touch or command. Moderate distress  HENT:  Head: Atraumatic.  Cardiovascular:  Rate 120s to 130s  Pulmonary/Chest:  BiPAP in place, saturation low with 100% oxygen  Abdominal: Soft. She exhibits no distension.  Musculoskeletal: She exhibits no edema.  Neurological:  Does not open eyes to touch or command  Skin: Skin is warm and dry.  Nursing note and vitals reviewed.   Vital Signs: BP (!) 155/99   Pulse (!) 135   Temp (!) 100.7 F (38.2 C) (Axillary)   Resp (!) 21   Ht 5\' 2"  (1.575 m)   Wt 57 kg (125 lb 10.6 oz)  SpO2 (!)  87%   BMI 22.98 kg/m  Pain Assessment: PAINAD POSS *See Group Information*: 1-Acceptable,Awake and alert Pain Score: 0-No pain   SpO2: SpO2: (!) 87 % O2 Device:SpO2: (!) 87 % O2 Flow Rate: .O2 Flow Rate (L/min): 15 L/min  IO: Intake/output summary:  Intake/Output Summary (Last 24 hours) at 2017/03/09 1241 Last data filed at 03/09/17 0606  Gross per 24 hour  Intake              170 ml  Output             1750 ml  Net            -1580 ml    LBM: Last BM Date: 03/04/17 Baseline Weight: Weight: 56.7 kg (125 lb) Most recent weight: Weight: 57 kg (125 lb 10.6 oz)     Palliative Assessment/Data:   Flowsheet Rows     Most Recent Value  Intake Tab  Referral Department  Hospitalist  Unit at Time of Referral  ICU  Palliative Care Primary Diagnosis  Trauma  Date Notified  03/09/17  Palliative Care Type  New Palliative care  Reason for referral  End of Life Care Assistance, Clarify Goals of Care  Date of Admission  02/22/2017  Date first seen by Palliative Care  2017/03/09  # of days Palliative referral response time  0 Day(s)  # of days IP prior to Palliative referral  6  Clinical Assessment  Palliative Performance Scale Score  10%  Pain Max last 24 hours  Not able to report  Pain Min Last 24 hours  Not able to report  Dyspnea Max Last 24 Hours  Not able to report  Dyspnea Min Last 24 hours  Not able to report  Psychosocial & Spiritual Assessment  Palliative Care Outcomes  Patient/Family meeting held?  Yes  Who was at the meeting?  husband and son at bedside  Patient/Family wishes: Interventions discontinued/not started   Mechanical Ventilation, BiPAP      Time In: 1110 Time Out: 1220 Time Total: 70 minutes Greater than 50%  of this time was spent counseling and coordinating care related to the above assessment and plan.  Signed by: Drue Novel, NP   Please contact Palliative Medicine Team phone at [phone] for questions and concerns.  For individual provider: See  Shea Evans

## 2017-03-20 NOTE — Progress Notes (Signed)
Tried readjusting BiPAP with out much success

## 2017-03-20 DEATH — deceased

## 2017-03-24 ENCOUNTER — Ambulatory Visit: Payer: Self-pay | Admitting: *Deleted

## 2017-04-01 ENCOUNTER — Ambulatory Visit: Payer: Self-pay | Admitting: Family Medicine

## 2017-04-01 ENCOUNTER — Ambulatory Visit: Payer: Self-pay | Admitting: Licensed Clinical Social Worker

## 2018-01-09 IMAGING — DX DG CHEST 2V
2 series · 2 of 2 positions shown · non-contrast
Comparison: Chest x-ray dated 12/07/2013.

CLINICAL DATA: Shortness of breath and productive cough for 2
weeks.

Ex-smoker, COPD, diabetic.
EXAM:
CHEST  2 VIEW

[chest pa]
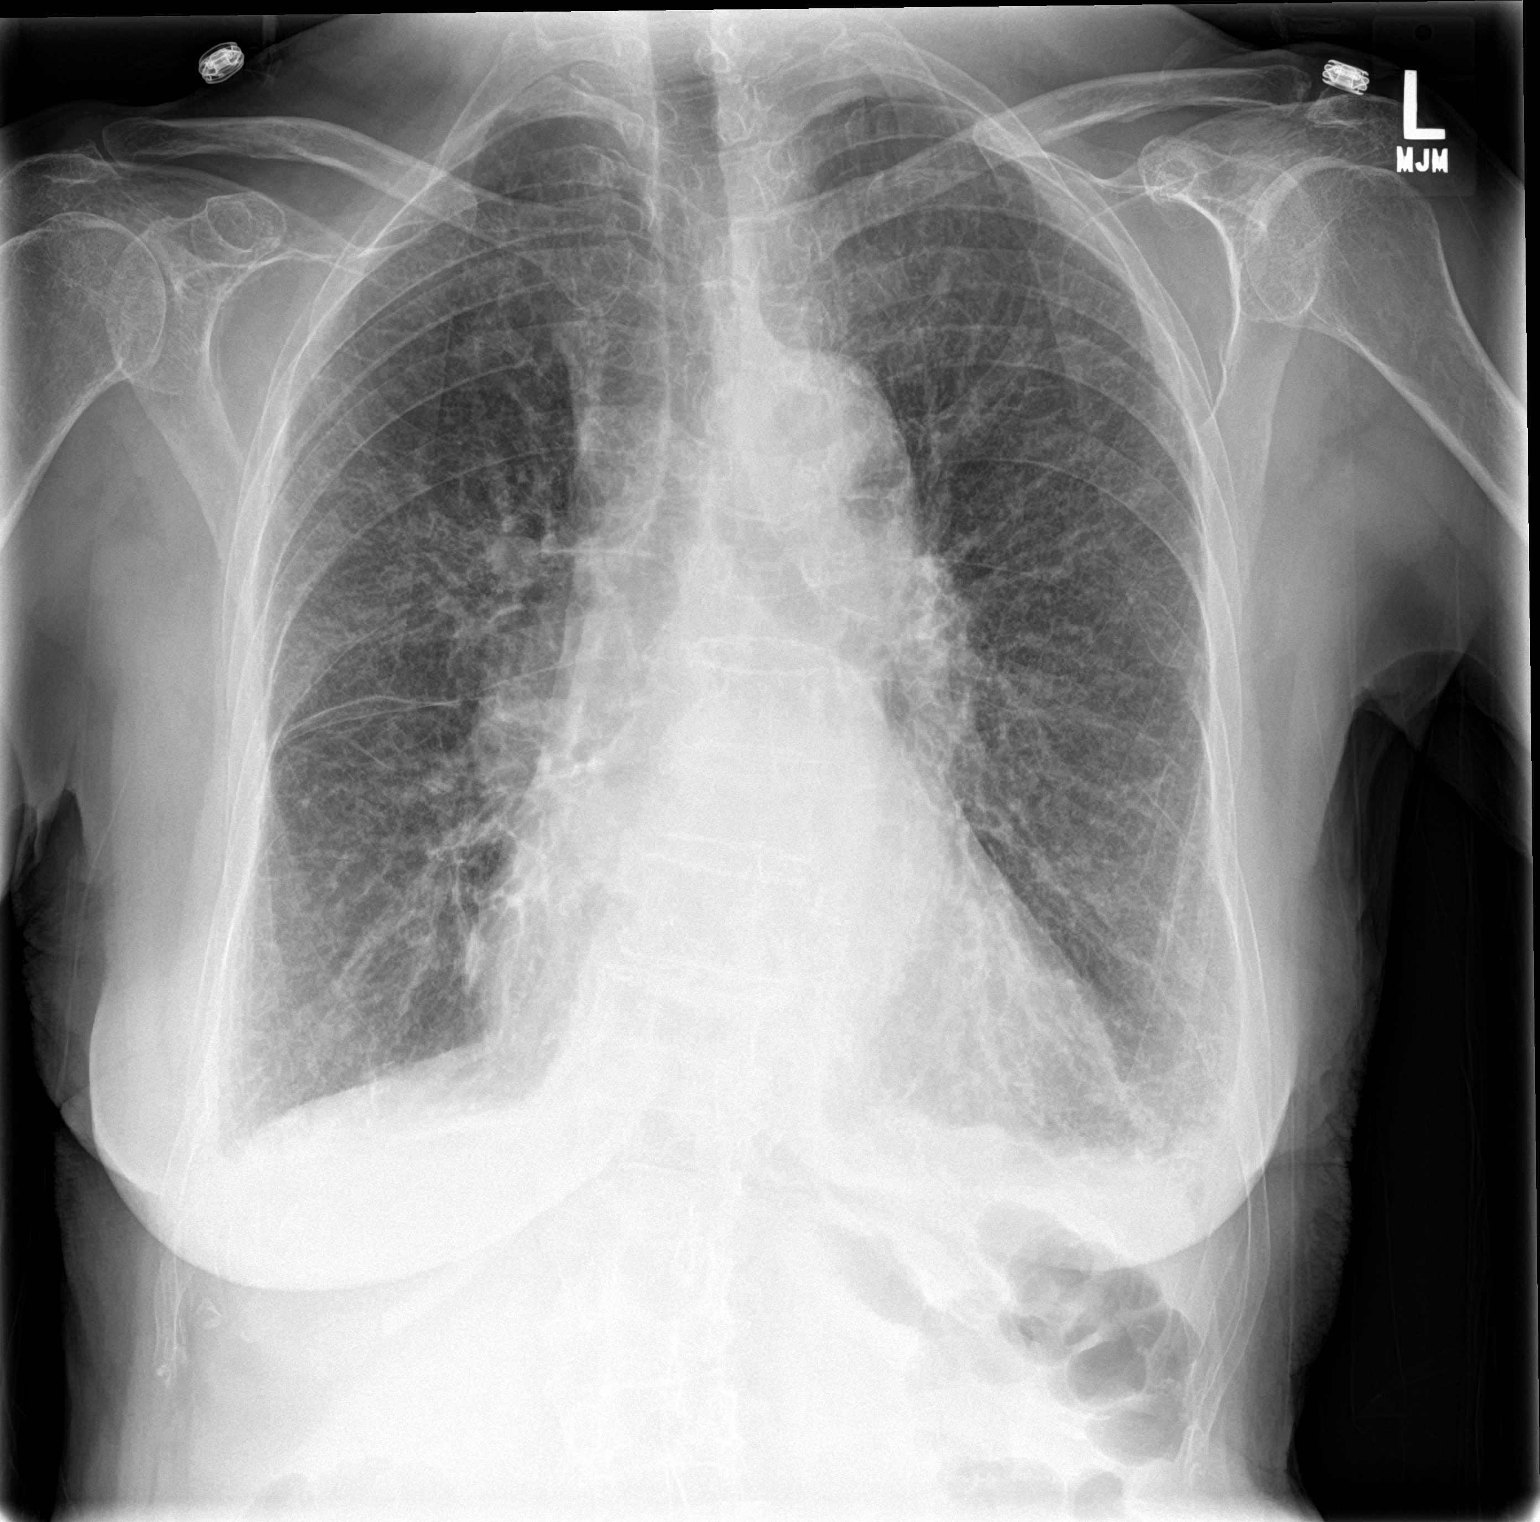

[chest lat]
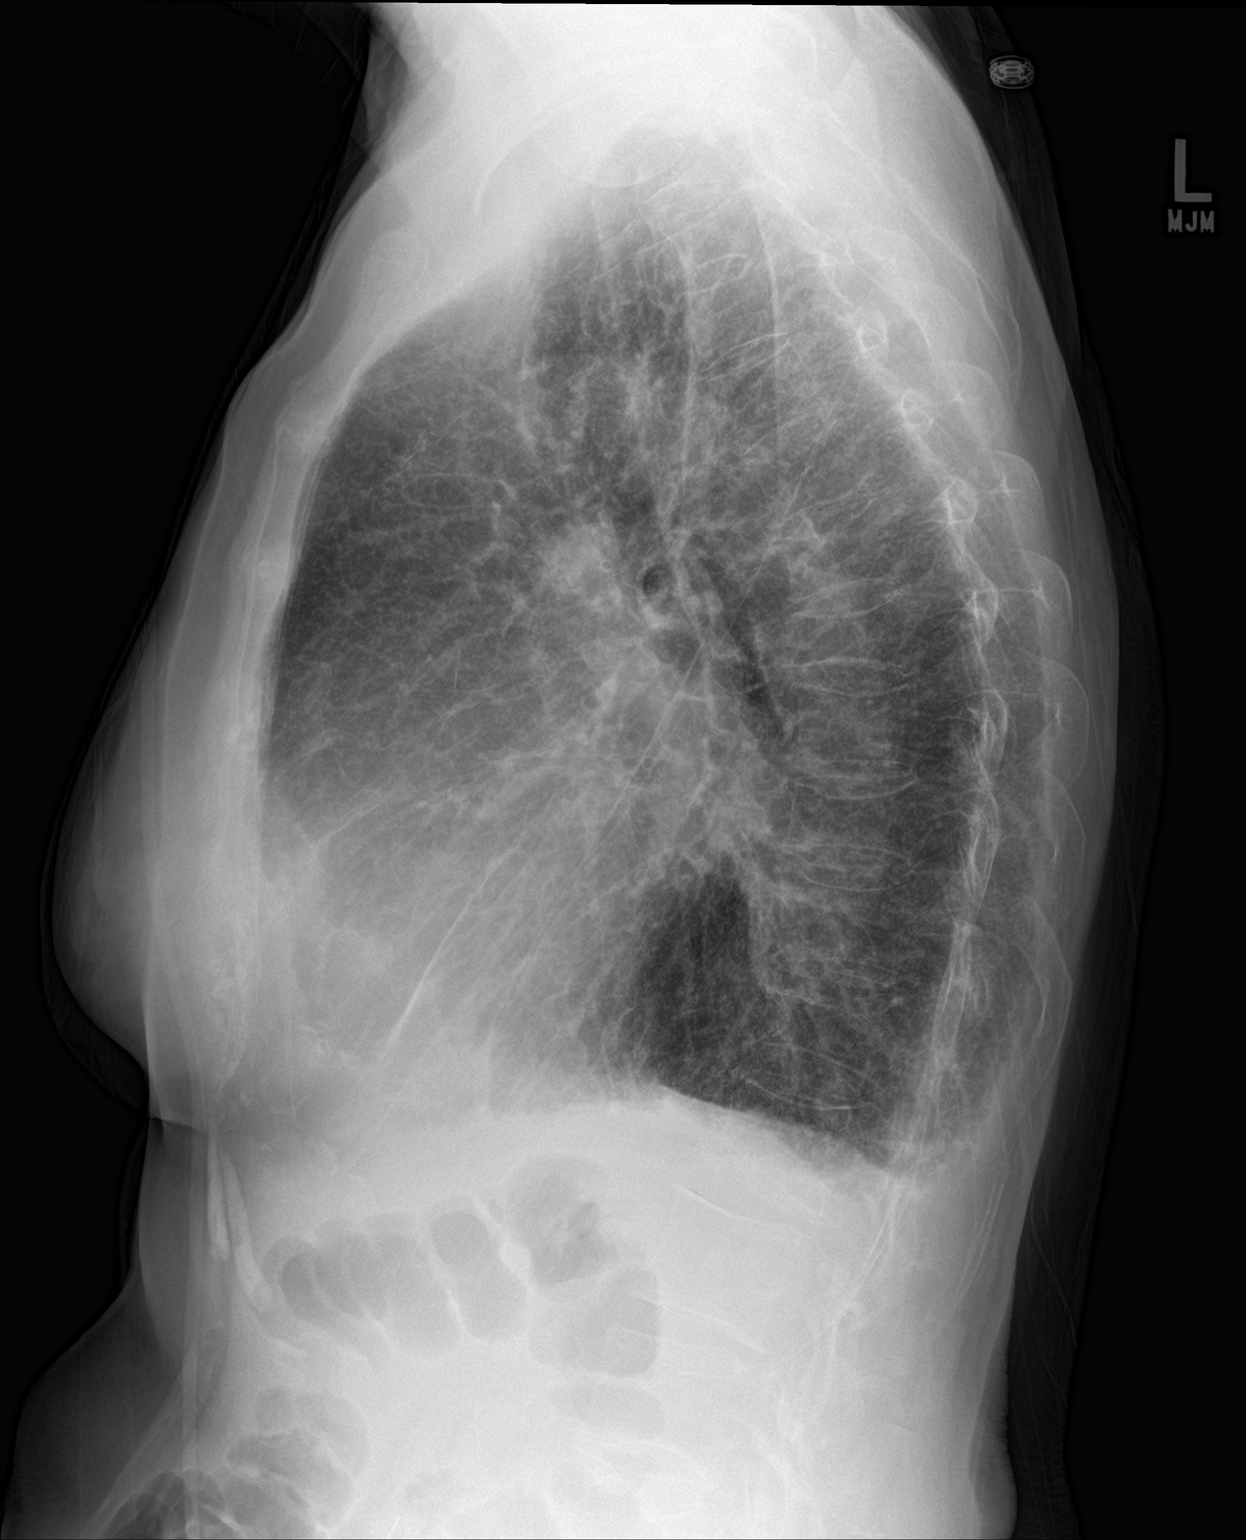

[2 of 2 positions shown; findings below may reference images not displayed]

FINDINGS: Lungs are hyperexpanded compatible with the given history of COPD.
Coarse interstitial lung markings again noted bilaterally suggesting
chronic interstitial lung disease. No new confluent airspace opacity
to suggest a developing pneumonia. Probable atelectasis and/or
chronic pleural thickening at each lung base, left greater than
right.

There are multiple wedge compression deformities within the thoracic
spine which appear chronic and predominantly similar in degree when
compared to an earlier chest CT of 06/13/2010. Stable levoscoliosis
of the thoracic spine. No acute-appearing osseous abnormality.
IMPRESSION: 1. Hyperexpanded lungs indicating COPD/emphysema. Suspect associated
chronic bronchitic changes centrally and a diffuse bilateral chronic
interstitial lung disease.
2. No acute findings.  No evidence of pneumonia.
3. Additional chronic/incidental findings detailed above.

## 2018-03-23 IMAGING — CT CT RENAL STONE PROTOCOL
2 of 4 series · 15 of 46 positions shown, 17 images · non-contrast
Comparison: MRI lumbar spine 12/21/2015 ; recent prior CT
abdomen/pelvis 12/19/2015

CLINICAL DATA: 89-year-old female with lower back pain radiating
down the right leg for several weeks but progressive over the last
day. Patient has a known and recently diagnosed acute/subacute
compression fracture at L3.

EXAM:
CT ABDOMEN AND PELVIS WITHOUT CONTRAST
TECHNIQUE: Multidetector CT imaging of the abdomen and pelvis was performed
following the standard protocol without IV contrast.

[Series 2: routine abd pel with · axial · 0.78mm/px · z∈[-468,-48]mm · 12 of 92 slices shown, 14 images]
[im 4/92  soft-tissue]
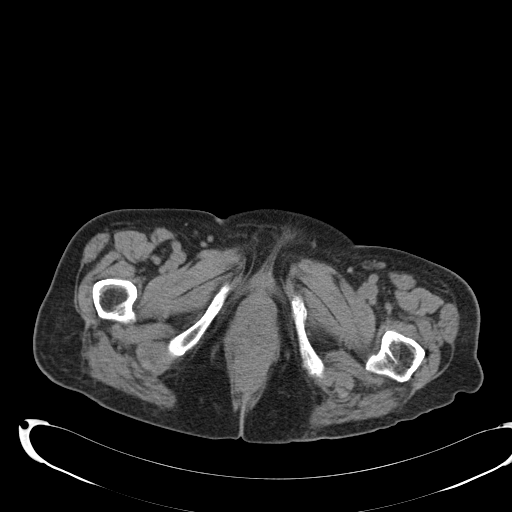
[im 4/92  bone]
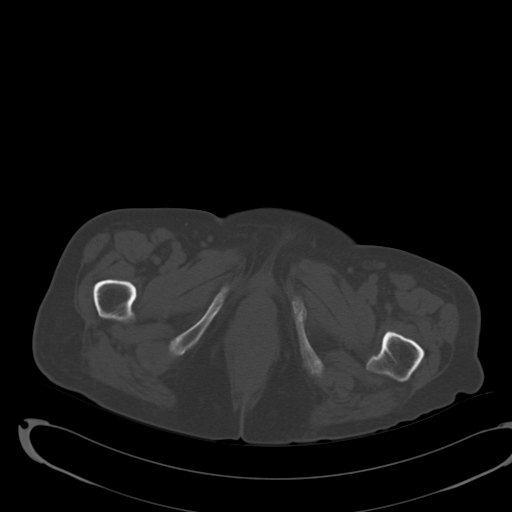
[im 12/92  soft-tissue]
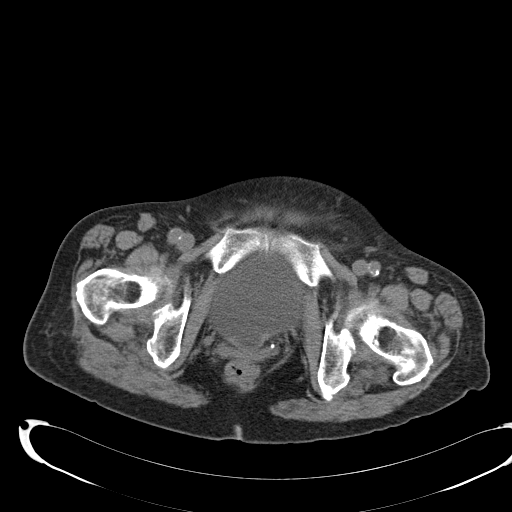
[im 20/92  soft-tissue]
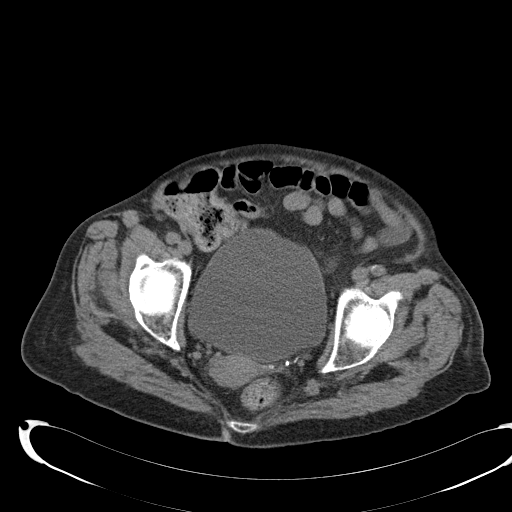
[im 28/92  soft-tissue]
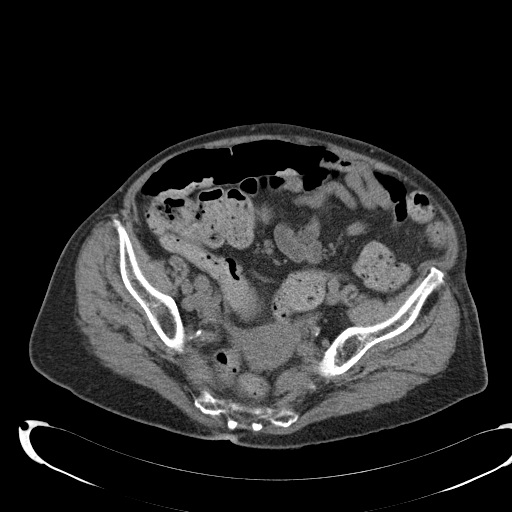
[im 36/92  soft-tissue]
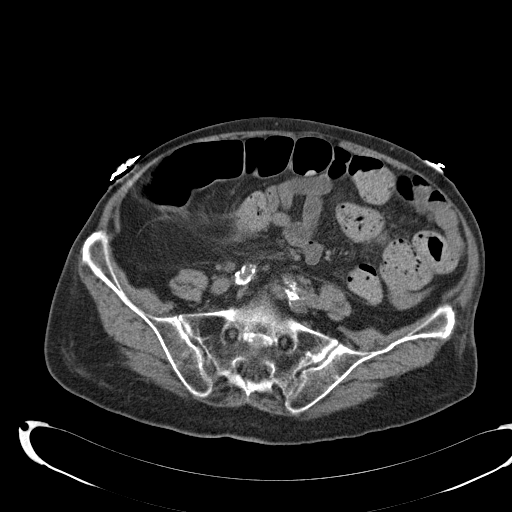
[im 44/92  soft-tissue]
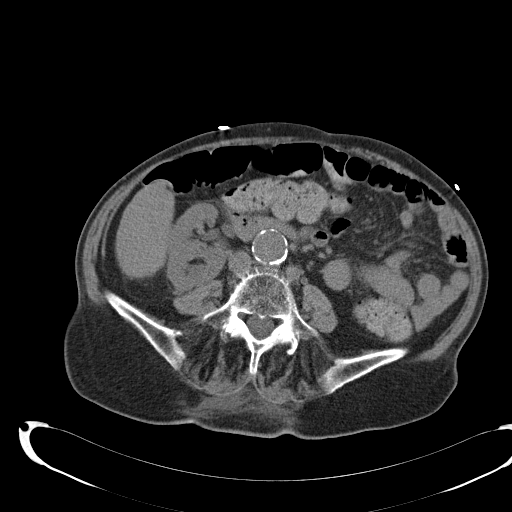
[im 48/92  soft-tissue]
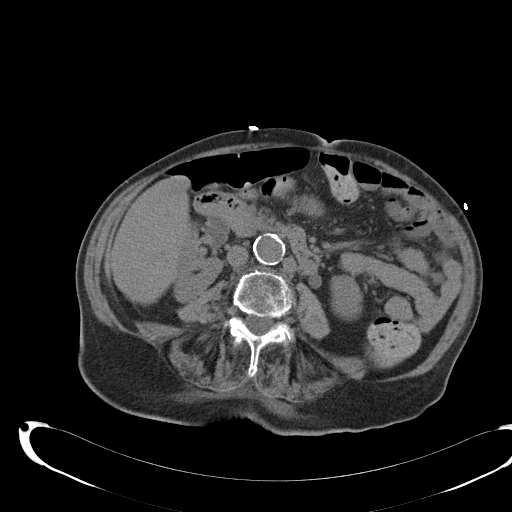
[im 56/92  soft-tissue]
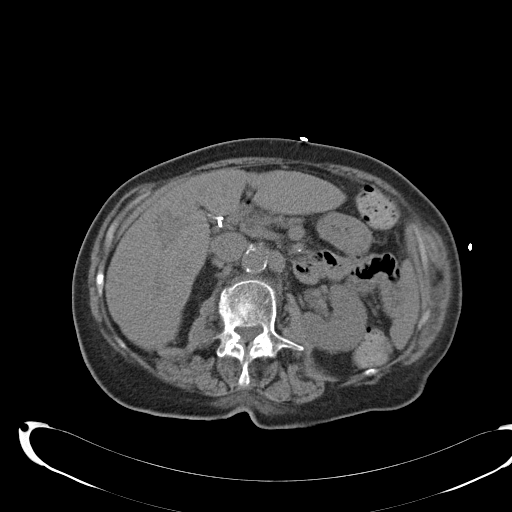
[im 64/92  soft-tissue]
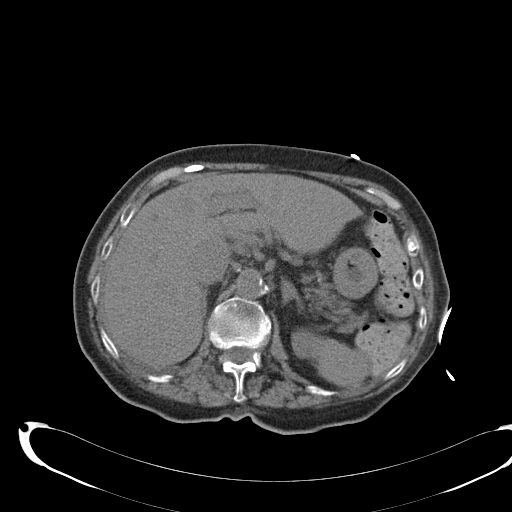
[im 64/92  bone]
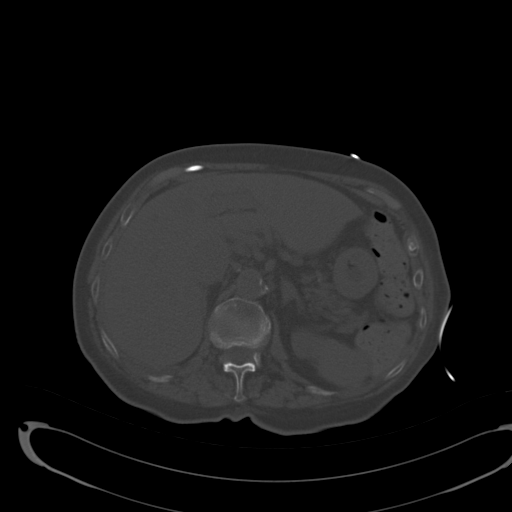
[im 72/92  soft-tissue]
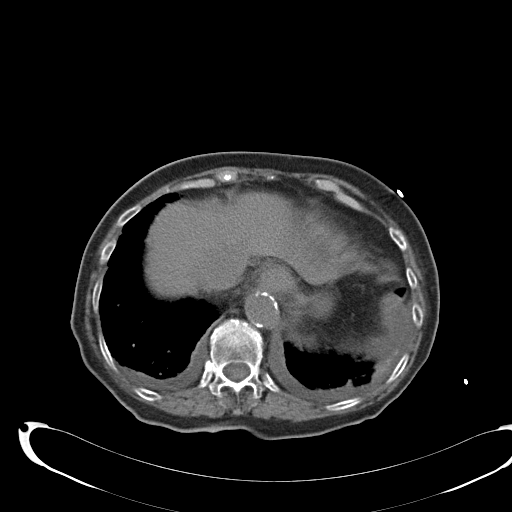
[im 80/92  soft-tissue]
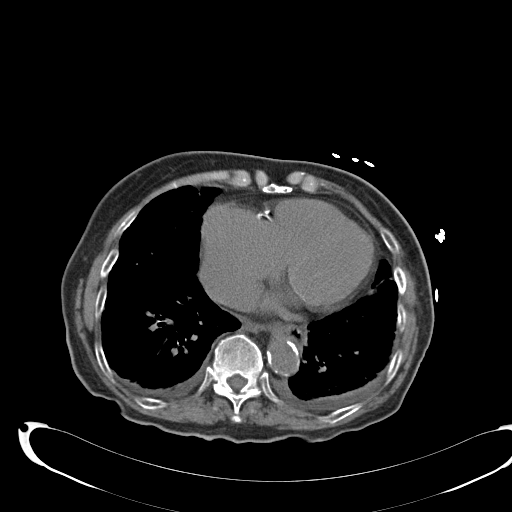
[im 88/92  soft-tissue]
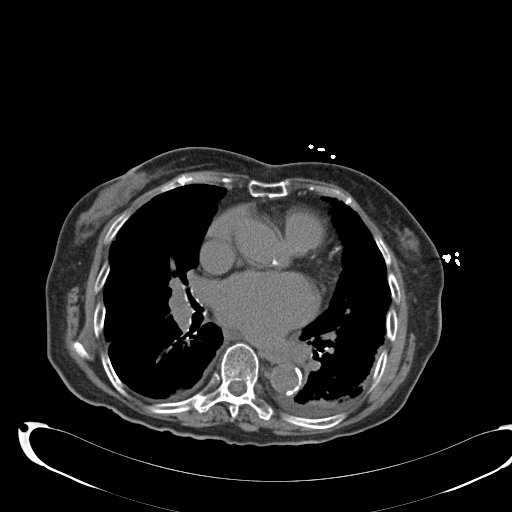

[Series 3: coronal · coronal · 0.73mm/px · 3 of 138 slices shown]
[im 46/138  soft-tissue]
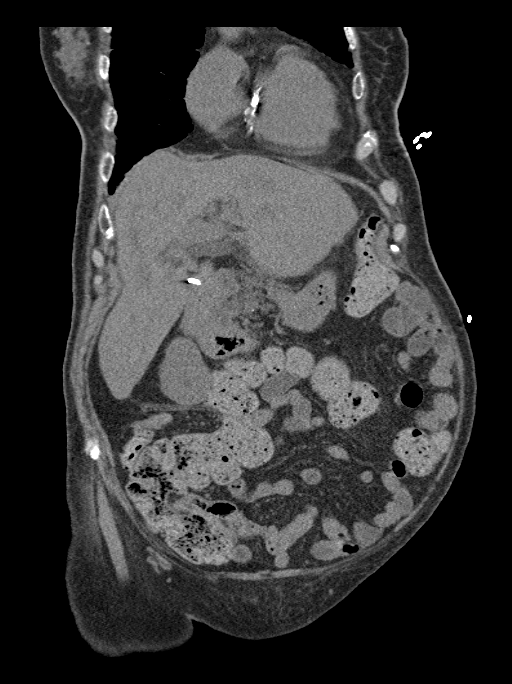
[im 61/138  soft-tissue]
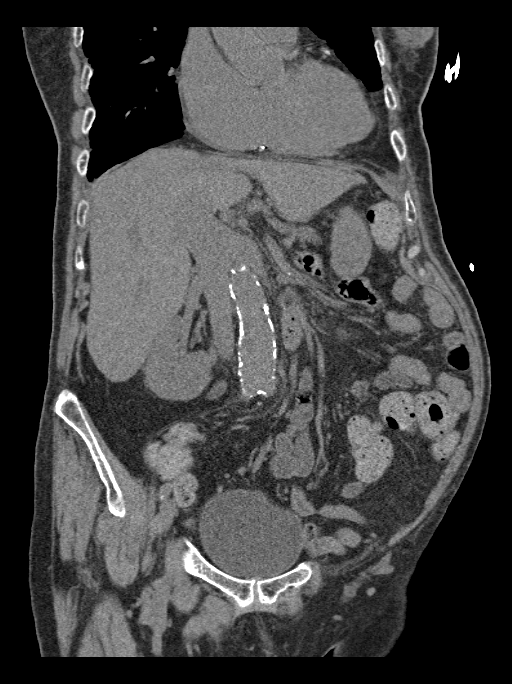
[im 77/138  soft-tissue]
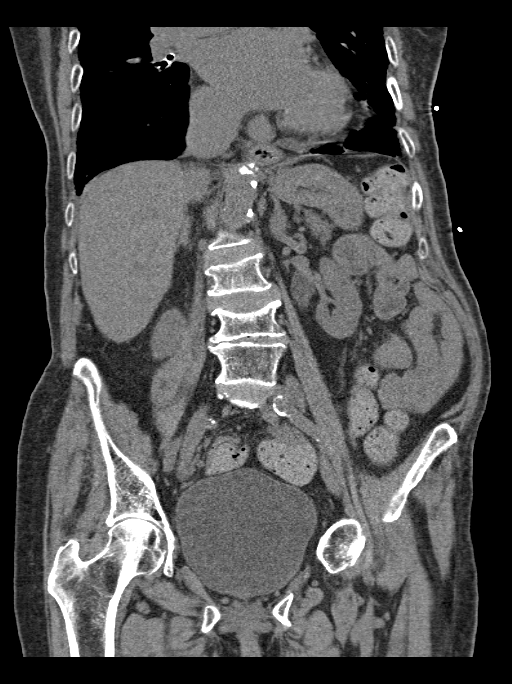

[15 of 46 positions shown; findings below may reference images not displayed]

FINDINGS: Lower chest: Trace bilateral pleural effusions and associated
bibasilar atelectasis. Diffuse mild lower lobe bronchial wall
thickening. Centrilobular emphysema. Incompletely imaged
cardiomegaly. Coronary artery calcifications. No pericardial
effusion. Small hiatal hernia.

Hepatobiliary: No mass visualized on this un-enhanced exam. The
gallbladder is surgically absent.

Pancreas: No mass or inflammatory process identified on this
un-enhanced exam.

Spleen: Within normal limits in size.

Adrenals/Urinary Tract: No evidence of urolithiasis or
hydronephrosis. No definite mass visualized on this un-enhanced
exam. Low-attenuation lesions have not changed since the recent
prior imaging in remain highly likely to represent benign cysts.

Stomach/Bowel: No evidence of obstruction, inflammatory process, or
abnormal fluid collections.

Vascular/Lymphatic: No pathologically enlarged lymph nodes. Aortic
calcifications. Aneurysmal dilatation of the distal infrarenal
segment just above the bifurcation with focal outpouching
posteriorly. The maximal aortic diameter measures approximately
cm which is unchanged compared to prior.

Reproductive: No mass or other significant abnormality.

Other: None.

Musculoskeletal: Stable compression fracture of the inferior
endplate of L3. No significant progression of height loss compared
to recent prior imaging. Stable remote L4, T12 and L1 compression
fractures. T10 compression fracture also appears chronic although
there is no definite prior imaging for comparison. Healed fracture
of the left inferior pubic ramus. Multilevel degenerative disc
disease and lower lumbar facet arthropathy without interval
progression.
IMPRESSION: 1. No new acute findings in the abdomen or pelvis compared to recent
prior imaging.
2. Persistent trace bilateral pleural effusions and associated lower
lobe atelectasis with mild bronchial wall thickening.
3. Coronary artery calcifications.
4. Stable 3.2 cm infrarenal abdominal aortic aneurysm. Recommend
followup by ultrasound in 3 years. This recommendation follows ACR
consensus guidelines: White Paper of the ACR Incidental Findings
Committee II on Vascular Findings. [HOSPITAL] 1784;
[DATE].
5. Multiple unchanged compression fractures including known
acute/subacute inferior endplate fracture at L3 and chronic
fractures at L4, T12 and L1. A T10 compression fracture also appears
chronic although there is no definite prior imaging for comparison.
6. Additional ancillary findings as above without significant
interval change.
